# Patient Record
Sex: Male | Born: 1960 | Race: White | Hispanic: No | Marital: Single | Smoking: Never smoker
Health system: Southern US, Community
[De-identification: ages and names within clinical notes are randomized; demographics above are authoritative.]

## PROBLEM LIST (undated history)

## (undated) DIAGNOSIS — F79 Unspecified intellectual disabilities: Secondary | ICD-10-CM

## (undated) DIAGNOSIS — G40909 Epilepsy, unspecified, not intractable, without status epilepticus: Secondary | ICD-10-CM

## (undated) DIAGNOSIS — R011 Cardiac murmur, unspecified: Secondary | ICD-10-CM

## (undated) DIAGNOSIS — I340 Nonrheumatic mitral (valve) insufficiency: Secondary | ICD-10-CM

## (undated) DIAGNOSIS — E785 Hyperlipidemia, unspecified: Secondary | ICD-10-CM

## (undated) DIAGNOSIS — F429 Obsessive-compulsive disorder, unspecified: Secondary | ICD-10-CM

## (undated) HISTORY — DX: Epilepsy, unspecified, not intractable, without status epilepticus: G40.909

## (undated) HISTORY — DX: Nonrheumatic mitral (valve) insufficiency: I34.0

## (undated) HISTORY — DX: Cardiac murmur, unspecified: R01.1

## (undated) HISTORY — DX: Obsessive-compulsive disorder, unspecified: F42.9

## (undated) HISTORY — DX: Unspecified intellectual disabilities: F79

## (undated) HISTORY — DX: Hyperlipidemia, unspecified: E78.5

---

## 2004-06-13 ENCOUNTER — Emergency Department: Payer: Self-pay | Admitting: Emergency Medicine

## 2004-06-13 ENCOUNTER — Other Ambulatory Visit: Payer: Self-pay

## 2004-06-14 ENCOUNTER — Inpatient Hospital Stay: Payer: Self-pay

## 2004-06-14 ENCOUNTER — Other Ambulatory Visit: Payer: Self-pay

## 2004-08-15 ENCOUNTER — Ambulatory Visit: Payer: Self-pay | Admitting: Psychiatry

## 2004-08-17 ENCOUNTER — Ambulatory Visit: Payer: Self-pay | Admitting: Psychiatry

## 2005-11-22 ENCOUNTER — Emergency Department: Payer: Self-pay | Admitting: Emergency Medicine

## 2009-10-02 ENCOUNTER — Inpatient Hospital Stay: Payer: Self-pay | Admitting: Internal Medicine

## 2011-09-06 DIAGNOSIS — F429 Obsessive-compulsive disorder, unspecified: Secondary | ICD-10-CM | POA: Diagnosis not present

## 2011-09-06 DIAGNOSIS — R569 Unspecified convulsions: Secondary | ICD-10-CM | POA: Diagnosis not present

## 2011-09-06 DIAGNOSIS — E785 Hyperlipidemia, unspecified: Secondary | ICD-10-CM | POA: Diagnosis not present

## 2012-03-11 DIAGNOSIS — E785 Hyperlipidemia, unspecified: Secondary | ICD-10-CM | POA: Diagnosis not present

## 2012-03-11 DIAGNOSIS — R569 Unspecified convulsions: Secondary | ICD-10-CM | POA: Diagnosis not present

## 2012-03-11 DIAGNOSIS — F429 Obsessive-compulsive disorder, unspecified: Secondary | ICD-10-CM | POA: Diagnosis not present

## 2012-05-01 DIAGNOSIS — R6250 Unspecified lack of expected normal physiological development in childhood: Secondary | ICD-10-CM | POA: Diagnosis not present

## 2012-05-01 DIAGNOSIS — R569 Unspecified convulsions: Secondary | ICD-10-CM | POA: Diagnosis not present

## 2012-05-01 DIAGNOSIS — K029 Dental caries, unspecified: Secondary | ICD-10-CM | POA: Diagnosis not present

## 2012-05-05 DIAGNOSIS — Z23 Encounter for immunization: Secondary | ICD-10-CM | POA: Diagnosis not present

## 2012-05-12 DIAGNOSIS — E7800 Pure hypercholesterolemia, unspecified: Secondary | ICD-10-CM | POA: Diagnosis not present

## 2012-05-20 DIAGNOSIS — F411 Generalized anxiety disorder: Secondary | ICD-10-CM | POA: Diagnosis not present

## 2012-05-20 DIAGNOSIS — F79 Unspecified intellectual disabilities: Secondary | ICD-10-CM | POA: Diagnosis not present

## 2012-05-20 DIAGNOSIS — K0530 Chronic periodontitis, unspecified: Secondary | ICD-10-CM | POA: Diagnosis not present

## 2012-05-20 DIAGNOSIS — K029 Dental caries, unspecified: Secondary | ICD-10-CM | POA: Diagnosis not present

## 2012-05-20 DIAGNOSIS — K0381 Cracked tooth: Secondary | ICD-10-CM | POA: Diagnosis not present

## 2012-05-20 DIAGNOSIS — K122 Cellulitis and abscess of mouth: Secondary | ICD-10-CM | POA: Diagnosis not present

## 2012-05-20 DIAGNOSIS — R569 Unspecified convulsions: Secondary | ICD-10-CM | POA: Diagnosis not present

## 2012-09-01 DIAGNOSIS — E7800 Pure hypercholesterolemia, unspecified: Secondary | ICD-10-CM | POA: Diagnosis not present

## 2012-09-01 DIAGNOSIS — E785 Hyperlipidemia, unspecified: Secondary | ICD-10-CM | POA: Diagnosis not present

## 2012-09-01 DIAGNOSIS — R569 Unspecified convulsions: Secondary | ICD-10-CM | POA: Diagnosis not present

## 2012-10-29 DIAGNOSIS — F329 Major depressive disorder, single episode, unspecified: Secondary | ICD-10-CM | POA: Diagnosis not present

## 2012-12-15 DIAGNOSIS — E7800 Pure hypercholesterolemia, unspecified: Secondary | ICD-10-CM | POA: Diagnosis not present

## 2012-12-15 DIAGNOSIS — Z125 Encounter for screening for malignant neoplasm of prostate: Secondary | ICD-10-CM | POA: Diagnosis not present

## 2012-12-15 DIAGNOSIS — G4700 Insomnia, unspecified: Secondary | ICD-10-CM | POA: Diagnosis not present

## 2012-12-15 DIAGNOSIS — Z0000 Encounter for general adult medical examination without abnormal findings: Secondary | ICD-10-CM | POA: Diagnosis not present

## 2012-12-29 DIAGNOSIS — Z1211 Encounter for screening for malignant neoplasm of colon: Secondary | ICD-10-CM | POA: Diagnosis not present

## 2013-02-17 ENCOUNTER — Ambulatory Visit: Payer: Self-pay | Admitting: Emergency Medicine

## 2013-02-17 DIAGNOSIS — G40909 Epilepsy, unspecified, not intractable, without status epilepticus: Secondary | ICD-10-CM | POA: Diagnosis not present

## 2013-02-17 DIAGNOSIS — F79 Unspecified intellectual disabilities: Secondary | ICD-10-CM | POA: Diagnosis not present

## 2013-02-17 DIAGNOSIS — Z1211 Encounter for screening for malignant neoplasm of colon: Secondary | ICD-10-CM | POA: Diagnosis not present

## 2013-02-17 DIAGNOSIS — Z7982 Long term (current) use of aspirin: Secondary | ICD-10-CM | POA: Diagnosis not present

## 2013-02-17 DIAGNOSIS — Z79899 Other long term (current) drug therapy: Secondary | ICD-10-CM | POA: Diagnosis not present

## 2013-05-07 DIAGNOSIS — Z23 Encounter for immunization: Secondary | ICD-10-CM | POA: Diagnosis not present

## 2013-05-28 DIAGNOSIS — G40909 Epilepsy, unspecified, not intractable, without status epilepticus: Secondary | ICD-10-CM | POA: Diagnosis not present

## 2013-05-28 DIAGNOSIS — E7800 Pure hypercholesterolemia, unspecified: Secondary | ICD-10-CM | POA: Diagnosis not present

## 2013-07-08 DIAGNOSIS — R7301 Impaired fasting glucose: Secondary | ICD-10-CM | POA: Diagnosis not present

## 2013-10-16 DIAGNOSIS — F79 Unspecified intellectual disabilities: Secondary | ICD-10-CM | POA: Diagnosis not present

## 2013-11-17 DIAGNOSIS — F79 Unspecified intellectual disabilities: Secondary | ICD-10-CM | POA: Diagnosis not present

## 2013-12-22 DIAGNOSIS — E7800 Pure hypercholesterolemia, unspecified: Secondary | ICD-10-CM | POA: Diagnosis not present

## 2013-12-22 DIAGNOSIS — Z125 Encounter for screening for malignant neoplasm of prostate: Secondary | ICD-10-CM | POA: Diagnosis not present

## 2013-12-22 DIAGNOSIS — E069 Thyroiditis, unspecified: Secondary | ICD-10-CM | POA: Diagnosis not present

## 2013-12-22 DIAGNOSIS — G40909 Epilepsy, unspecified, not intractable, without status epilepticus: Secondary | ICD-10-CM | POA: Diagnosis not present

## 2013-12-22 DIAGNOSIS — I10 Essential (primary) hypertension: Secondary | ICD-10-CM | POA: Diagnosis not present

## 2013-12-22 DIAGNOSIS — F429 Obsessive-compulsive disorder, unspecified: Secondary | ICD-10-CM | POA: Diagnosis not present

## 2014-01-12 DIAGNOSIS — F79 Unspecified intellectual disabilities: Secondary | ICD-10-CM | POA: Diagnosis not present

## 2014-05-21 DIAGNOSIS — Z23 Encounter for immunization: Secondary | ICD-10-CM | POA: Diagnosis not present

## 2014-07-07 DIAGNOSIS — F42 Obsessive-compulsive disorder: Secondary | ICD-10-CM | POA: Diagnosis not present

## 2014-07-07 DIAGNOSIS — E780 Pure hypercholesterolemia: Secondary | ICD-10-CM | POA: Diagnosis not present

## 2014-07-07 DIAGNOSIS — G40909 Epilepsy, unspecified, not intractable, without status epilepticus: Secondary | ICD-10-CM | POA: Diagnosis not present

## 2014-07-07 DIAGNOSIS — R010 Benign and innocent cardiac murmurs: Secondary | ICD-10-CM | POA: Diagnosis not present

## 2014-07-07 DIAGNOSIS — F79 Unspecified intellectual disabilities: Secondary | ICD-10-CM | POA: Diagnosis not present

## 2014-09-08 DIAGNOSIS — F72 Severe intellectual disabilities: Secondary | ICD-10-CM | POA: Diagnosis not present

## 2014-10-06 DIAGNOSIS — F919 Conduct disorder, unspecified: Secondary | ICD-10-CM | POA: Diagnosis not present

## 2014-11-17 DIAGNOSIS — F919 Conduct disorder, unspecified: Secondary | ICD-10-CM | POA: Diagnosis not present

## 2014-11-30 DIAGNOSIS — J029 Acute pharyngitis, unspecified: Secondary | ICD-10-CM | POA: Diagnosis not present

## 2014-11-30 DIAGNOSIS — J209 Acute bronchitis, unspecified: Secondary | ICD-10-CM | POA: Diagnosis not present

## 2014-12-01 DIAGNOSIS — G40909 Epilepsy, unspecified, not intractable, without status epilepticus: Secondary | ICD-10-CM | POA: Insufficient documentation

## 2014-12-01 DIAGNOSIS — E785 Hyperlipidemia, unspecified: Secondary | ICD-10-CM | POA: Insufficient documentation

## 2014-12-01 DIAGNOSIS — F429 Obsessive-compulsive disorder, unspecified: Secondary | ICD-10-CM | POA: Insufficient documentation

## 2014-12-01 DIAGNOSIS — F79 Unspecified intellectual disabilities: Secondary | ICD-10-CM | POA: Insufficient documentation

## 2014-12-01 DIAGNOSIS — R011 Cardiac murmur, unspecified: Secondary | ICD-10-CM | POA: Insufficient documentation

## 2014-12-01 DIAGNOSIS — E781 Pure hyperglyceridemia: Secondary | ICD-10-CM | POA: Insufficient documentation

## 2014-12-08 ENCOUNTER — Ambulatory Visit
Admission: RE | Admit: 2014-12-08 | Discharge: 2014-12-08 | Disposition: A | Discharge status: Home / Self Care | Payer: Medicare Other | Source: Ambulatory Visit | Attending: Family Medicine | Admitting: Family Medicine

## 2014-12-08 ENCOUNTER — Other Ambulatory Visit: Payer: Self-pay | Admitting: Family Medicine

## 2014-12-08 DIAGNOSIS — R062 Wheezing: Secondary | ICD-10-CM

## 2014-12-08 DIAGNOSIS — R059 Cough, unspecified: Secondary | ICD-10-CM

## 2014-12-08 DIAGNOSIS — R05 Cough: Secondary | ICD-10-CM

## 2014-12-08 DIAGNOSIS — J209 Acute bronchitis, unspecified: Secondary | ICD-10-CM | POA: Diagnosis not present

## 2014-12-21 DIAGNOSIS — J209 Acute bronchitis, unspecified: Secondary | ICD-10-CM | POA: Diagnosis not present

## 2015-01-04 ENCOUNTER — Encounter: Payer: Self-pay | Admitting: Unknown Physician Specialty

## 2015-01-04 ENCOUNTER — Ambulatory Visit (INDEPENDENT_AMBULATORY_CARE_PROVIDER_SITE_OTHER): Payer: Medicare Other | Admitting: Unknown Physician Specialty

## 2015-01-04 VITALS — BP 114/74 | HR 80 | Temp 97.6°F | Ht 64.5 in | Wt 149.0 lb

## 2015-01-04 DIAGNOSIS — H8110 Benign paroxysmal vertigo, unspecified ear: Secondary | ICD-10-CM

## 2015-01-04 MED ORDER — MECLIZINE HCL 32 MG PO TABS: 32.0000 mg | ORAL_TABLET | Freq: Three times a day (TID) | ORAL | Status: DC | PRN

## 2015-01-04 NOTE — Progress Notes (Signed)
   BP 114/74 mmHg  Pulse 80  Temp(Src) 97.6 F (36.4 C) (Oral)  Ht 5' 4.5" (1.638 m)  Wt 149 lb (67.586 kg)  BMI 25.19 kg/m2  SpO2 95%   Subjective:    Patient ID: Raymond [name], male    DOB: [birth year], 54 y.o.   MRN: [id number]  HPI: Raymond [name] is a 54 y.o. male presenting on 01/04/2015 for Dizziness  Pt is here with his caregiver who gives the history.  Woke up light headed and a bit wobbly when he walks.  He went to work.  He is better now but still has some symptoms.  No nausea or vomiting.  2 weeks ago was on a lot of cold medicine.  This seems to be better.  No chest pain or SOB.  Caregiver denies mental status changes.      Relevant past medical, surgical, family and social history reviewed and updated as indicated. Interim medical history since our last visit reviewed. Allergies and medications reviewed and updated.  Review of Systems  Per HPI unless specifically indicated above     Objective:    BP 114/74 mmHg  Pulse 80  Temp(Src) 97.6 F (36.4 C) (Oral)  Ht 5' 4.5" (1.638 m)  Wt 149 lb (67.586 kg)  BMI 25.19 kg/m2  SpO2 95%  Wt Readings from Last 3 Encounters:  01/04/15 149 lb (67.586 kg)  12/21/14 147 lb (66.679 kg)    Physical Exam  Constitutional: He appears well-developed and well-nourished.  HENT:  Head: Normocephalic and atraumatic.  Right Ear: Tympanic membrane and ear canal normal.  Left Ear: Tympanic membrane and ear canal normal.  Nose: Nose normal.  Mouth/Throat: Uvula is midline.  Eyes: Pupils are equal, round, and reactive to light.  Neck: Normal range of motion.  Cardiovascular: Normal rate and regular rhythm.   Pulmonary/Chest: Effort normal and breath sounds normal.  Neurological: He has normal reflexes.  Skin: Skin is warm and dry.       Assessment & Plan:   Problem List Items Addressed This Visit    None    Visit Diagnoses    Benign positional vertigo, unspecified laterality    -  Primary    Rx for Meclizine to take  prn    Relevant Medications    meclizine (ANTIVERT) 32 MG tablet       Follow up plan: Rx for Meclizine.  If no improvement, needs Tegretol and Depakote levels done.

## 2015-01-04 NOTE — Patient Instructions (Signed)
Benign Positional Vertigo Vertigo means you feel like you or your surroundings are moving when they are not. Benign positional vertigo is the most common form of vertigo. Benign means that the cause of your condition is not serious. Benign positional vertigo is more common in older adults. CAUSES  Benign positional vertigo is the result of an upset in the labyrinth system. This is an area in the middle ear that helps control your balance. This may be caused by a viral infection, head injury, or repetitive motion. However, often no specific cause is found. SYMPTOMS  Symptoms of benign positional vertigo occur when you move your head or eyes in different directions. Some of the symptoms may include:  Loss of balance and falls.  Vomiting.  Blurred vision.  Dizziness.  Nausea.  Involuntary eye movements (nystagmus). DIAGNOSIS  Benign positional vertigo is usually diagnosed by physical exam. If the specific cause of your benign positional vertigo is unknown, your caregiver may perform imaging tests, such as magnetic resonance imaging (MRI) or computed tomography (CT). TREATMENT  Your caregiver may recommend movements or procedures to correct the benign positional vertigo. Medicines such as meclizine, benzodiazepines, and medicines for nausea may be used to treat your symptoms. In rare cases, if your symptoms are caused by certain conditions that affect the inner ear, you may need surgery. HOME CARE INSTRUCTIONS   Follow your caregiver's instructions.  Move slowly. Do not make sudden body or head movements.  Avoid driving.  Avoid operating heavy machinery.  Avoid performing any tasks that would be dangerous to you or others during a vertigo episode.  Drink enough fluids to keep your urine clear or pale yellow. SEEK IMMEDIATE MEDICAL CARE IF:   You develop problems with walking, weakness, numbness, or using your arms, hands, or legs.  You have difficulty speaking.  You develop  severe headaches.  Your nausea or vomiting continues or gets worse.  You develop visual changes.  Your family or friends notice any behavioral changes.  Your condition gets worse.  You have a fever.  You develop a stiff neck or sensitivity to light. MAKE SURE YOU:   Understand these instructions.  Will watch your condition.  Will get help right away if you are not doing well or get worse. Document Released: 04/23/2006 Document Revised: 10/08/2011 Document Reviewed: 04/05/2011 Exit[employer] Patient Information 2015 [employer]. This information is not intended to replace advice given to you by your health care provider. Make sure you discuss any questions you have with your health care provider.    

## 2015-01-12 ENCOUNTER — Other Ambulatory Visit: Payer: Self-pay | Admitting: Family Medicine

## 2015-01-12 ENCOUNTER — Telehealth: Payer: Self-pay | Admitting: Family Medicine

## 2015-01-12 ENCOUNTER — Ambulatory Visit (INDEPENDENT_AMBULATORY_CARE_PROVIDER_SITE_OTHER): Payer: Medicare Other | Admitting: Family Medicine

## 2015-01-12 ENCOUNTER — Encounter: Payer: Self-pay | Admitting: Family Medicine

## 2015-01-12 VITALS — BP 110/71 | HR 65 | Temp 98.2°F | Ht 66.0 in | Wt 147.0 lb

## 2015-01-12 DIAGNOSIS — E785 Hyperlipidemia, unspecified: Secondary | ICD-10-CM | POA: Diagnosis not present

## 2015-01-12 DIAGNOSIS — G40909 Epilepsy, unspecified, not intractable, without status epilepticus: Secondary | ICD-10-CM | POA: Diagnosis not present

## 2015-01-12 DIAGNOSIS — Z0000 Encounter for general adult medical examination without abnormal findings: Secondary | ICD-10-CM

## 2015-01-12 DIAGNOSIS — F79 Unspecified intellectual disabilities: Secondary | ICD-10-CM | POA: Diagnosis not present

## 2015-01-12 DIAGNOSIS — F42 Obsessive-compulsive disorder: Secondary | ICD-10-CM

## 2015-01-12 DIAGNOSIS — G40009 Localization-related (focal) (partial) idiopathic epilepsy and epileptic syndromes with seizures of localized onset, not intractable, without status epilepticus: Secondary | ICD-10-CM | POA: Diagnosis not present

## 2015-01-12 DIAGNOSIS — Z125 Encounter for screening for malignant neoplasm of prostate: Secondary | ICD-10-CM | POA: Diagnosis not present

## 2015-01-12 DIAGNOSIS — R011 Cardiac murmur, unspecified: Secondary | ICD-10-CM

## 2015-01-12 DIAGNOSIS — F429 Obsessive-compulsive disorder, unspecified: Secondary | ICD-10-CM | POA: Insufficient documentation

## 2015-01-12 LAB — LIPID PANEL PICCOLO, WAIVED
CHOL/HDL RATIO PICCOLO,WAIVE: 5.1 mg/dL — AB
Cholesterol Piccolo, Waived: 188 mg/dL (ref <200)
HDL Chol Piccolo, Waived: 37 mg/dL — ABNORMAL LOW (ref >59)
LDL CHOL CALC PICCOLO WAIVED: 121 mg/dL — AB (ref <100)
Triglycerides Piccolo,Waived: 147 mg/dL (ref <150)
VLDL CHOL CALC PICCOLO,WAIVE: 29 mg/dL (ref <30)

## 2015-01-12 LAB — URINALYSIS, ROUTINE W REFLEX MICROSCOPIC
Bilirubin, UA: NEGATIVE
GLUCOSE, UA: NEGATIVE
KETONES UA: NEGATIVE
Leukocytes, UA: NEGATIVE
Nitrite, UA: NEGATIVE
Protein, UA: NEGATIVE
RBC UA: NEGATIVE
Specific Gravity, UA: 1.025 (ref 1.005–1.030)
UUROB: 0.2 mg/dL (ref 0.2–1.0)
pH, UA: 5.5 (ref 5.0–7.5)

## 2015-01-12 MED ORDER — CARBAMAZEPINE 200 MG PO TABS: 200.0000 mg | ORAL_TABLET | Freq: Three times a day (TID) | ORAL | Status: DC

## 2015-01-12 MED ORDER — DIVALPROEX SODIUM ER 500 MG PO TB24: 1500.0000 mg | ORAL_TABLET | Freq: Every day | ORAL | Status: DC

## 2015-01-12 MED ORDER — FLUTICASONE PROPIONATE 50 MCG/ACT NA SUSP: 2.0000 | Freq: Every day | NASAL | Status: DC

## 2015-01-12 MED ORDER — SIMVASTATIN 20 MG PO TABS: 20.0000 mg | ORAL_TABLET | Freq: Every day | ORAL | Status: DC

## 2015-01-12 MED ORDER — FENOFIBRATE 145 MG PO TABS: 145.0000 mg | ORAL_TABLET | Freq: Every day | ORAL | Status: DC

## 2015-01-12 NOTE — Assessment & Plan Note (Signed)
Pt has not had any check of heart murmur. No current signs or sx.

## 2015-01-12 NOTE — Assessment & Plan Note (Signed)
The current medical regimen is effective;  continue present plan and medications.  

## 2015-01-12 NOTE — Progress Notes (Signed)
BP 110/71 mmHg  Pulse 65  Temp(Src) 98.2 F (36.8 C)  Ht 5\' 6"  (1.676 m)  Wt 147 lb (66.679 kg)  BMI 23.74 kg/m2  SpO2 98%   Subjective:    Patient ID: Raymond [name], male    DOB: 1960-09-18, 54 y.o.   MRN: [id number]  HPI: Raymond [name] is a 54 y.o. male  Chief Complaint  Patient presents with  . Annual Exam    Relevant past medical, surgical, family and social history reviewed and updated as indicated. Interim medical history since our last visit reviewed. Allergies and medications reviewed and updated.  psy issues stable in care home and here with caregiver who reports on pt. Pt doing well with no c/o. No problems with meds  Work is going well  Lipids stable and meds doing well  Review of Systems  Constitutional: Negative.   HENT: Negative.   Eyes: Negative.   Respiratory: Negative.   Cardiovascular: Negative.   Endocrine: Negative.   Musculoskeletal: Negative.   Skin: Negative.   Allergic/Immunologic: Negative.   Neurological: Negative.   Hematological: Negative.   Psychiatric/Behavioral: Negative.     Per HPI unless specifically indicated above     Objective:    BP 110/71 mmHg  Pulse 65  Temp(Src) 98.2 F (36.8 C)  Ht 5\' 6"  (1.676 m)  Wt 147 lb (66.679 kg)  BMI 23.74 kg/m2  SpO2 98%  Wt Readings from Last 3 Encounters:  01/12/15 147 lb (66.679 kg)  01/04/15 149 lb (67.586 kg)  12/21/14 147 lb (66.679 kg)    Physical Exam  Constitutional: He is oriented to person, place, and time. He appears well-developed and well-nourished.  HENT:  Head: Normocephalic and atraumatic.  Right Ear: External ear normal.  Left Ear: External ear normal.  Eyes: Conjunctivae and EOM are normal. Pupils are equal, round, and reactive to light.  Neck: Normal range of motion. Neck supple.  Cardiovascular: Normal rate, regular rhythm, intact distal pulses and normal pulses.   Murmur heard.  Systolic murmur is present with a grade of 3/6  Aortic    Pulmonary/Chest: Effort normal and breath sounds normal.  Abdominal: Soft. Bowel sounds are normal. There is no splenomegaly or hepatomegaly.  Genitourinary: Rectum normal and penis normal.  Prostate exam limited  Harriett Sine was chaperone  Musculoskeletal: Normal range of motion.  Neurological: He is alert and oriented to person, place, and time. He has normal reflexes.  Skin: No rash noted. No erythema.  Psychiatric: He has a normal mood and affect. His behavior is normal. Judgment and thought content normal.    No results found for this or any previous visit.    Assessment & Plan:   Problem List Items Addressed This Visit      Nervous and Auditory   Epileptic seizures    The current medical regimen is effective;  continue present plan and medications.       Relevant Medications   divalproex (DEPAKOTE ER) 500 MG 24 hr tablet   carbamazepine (TEGRETOL) 200 MG tablet   Other Relevant Orders   Basic metabolic panel     Other   Mental retardation   OCD (obsessive compulsive disorder)   Hyperlipidemia    The current medical regimen is effective;  continue present plan and medications.       Relevant Medications   simvastatin (ZOCOR) 20 MG tablet   fenofibrate (TRICOR) 145 MG tablet   Other Relevant Orders   ALT   AST   Lipid  panel   Heart murmur    Pt has not had any check of heart murmur. No current signs or sx.      Relevant Orders   Ambulatory referral to Cardiology    Other Visit Diagnoses    Routine general medical examination at a health care facility    -  Primary    Relevant Orders    CBC with Differential/Platelet    Comprehensive metabolic panel    Lipid Panel w/o Chol/HDL Ratio    TSH    Urinalysis, Routine w reflex microscopic    Screening PSA (prostate specific antigen)        Relevant Orders    CBC with Differential/Platelet    Comprehensive metabolic panel    Lipid Panel w/o Chol/HDL Ratio    TSH    Urinalysis, Routine w reflex microscopic         Follow up plan: Return in about 6 months (around 07/14/2015) for lipid check.

## 2015-01-13 LAB — CBC WITH DIFFERENTIAL/PLATELET
Basophils Absolute: 0 10*3/uL (ref 0.0–0.2)
Basos: 0 %
EOS (ABSOLUTE): 0.1 10*3/uL (ref 0.0–0.4)
EOS: 2 %
Hematocrit: 37.8 % (ref 37.5–51.0)
Hemoglobin: 12.7 g/dL (ref 12.6–17.7)
IMMATURE GRANS (ABS): 0 10*3/uL (ref 0.0–0.1)
Immature Granulocytes: 0 %
LYMPHS: 57 %
Lymphocytes Absolute: 2.6 10*3/uL (ref 0.7–3.1)
MCH: 31.1 pg (ref 26.6–33.0)
MCHC: 33.6 g/dL (ref 31.5–35.7)
MCV: 92 fL (ref 79–97)
MONOCYTES: 10 %
MONOS ABS: 0.4 10*3/uL (ref 0.1–0.9)
NEUTROS PCT: 31 %
Neutrophils Absolute: 1.4 10*3/uL (ref 1.4–7.0)
PLATELETS: 166 10*3/uL (ref 150–379)
RBC: 4.09 x10E6/uL — AB (ref 4.14–5.80)
RDW: 14.5 % (ref 12.3–15.4)
WBC: 4.5 10*3/uL (ref 3.4–10.8)

## 2015-01-13 LAB — COMPREHENSIVE METABOLIC PANEL
A/G RATIO: 1.8 (ref 1.1–2.5)
ALT: 12 IU/L (ref 0–44)
AST: 19 IU/L (ref 0–40)
Albumin: 4.4 g/dL (ref 3.5–5.5)
Alkaline Phosphatase: 40 IU/L (ref 39–117)
BUN/Creatinine Ratio: 18 (ref 9–20)
BUN: 15 mg/dL (ref 6–24)
Bilirubin Total: 0.3 mg/dL (ref 0.0–1.2)
CHLORIDE: 102 mmol/L (ref 97–108)
CO2: 19 mmol/L (ref 18–29)
Calcium: 9.2 mg/dL (ref 8.7–10.2)
Creatinine, Ser: 0.84 mg/dL (ref 0.76–1.27)
GFR calc Af Amer: 116 mL/min/{1.73_m2} (ref >59)
GFR, EST NON AFRICAN AMERICAN: 101 mL/min/{1.73_m2} (ref >59)
Globulin, Total: 2.4 g/dL (ref 1.5–4.5)
Glucose: 99 mg/dL (ref 65–99)
POTASSIUM: 4.3 mmol/L (ref 3.5–5.2)
Sodium: 138 mmol/L (ref 134–144)
TOTAL PROTEIN: 6.8 g/dL (ref 6.0–8.5)

## 2015-01-13 LAB — TSH: TSH: 3.72 u[IU]/mL (ref 0.450–4.500)

## 2015-01-17 ENCOUNTER — Telehealth: Payer: Self-pay | Admitting: Family Medicine

## 2015-01-18 ENCOUNTER — Telehealth: Payer: Self-pay

## 2015-01-18 NOTE — Telephone Encounter (Signed)
[name] from [facility] and [facility] are requesting clarification for patient's Depakote Rx Historically he has been on Depakote DR 500mg  TID  New Rx was for Depakote ER Did you mean to change?

## 2015-01-19 MED ORDER — DIVALPROEX SODIUM 500 MG PO DR TAB: 500.0000 mg | DELAYED_RELEASE_TABLET | Freq: Three times a day (TID) | ORAL | Status: DC

## 2015-02-09 DIAGNOSIS — F919 Conduct disorder, unspecified: Secondary | ICD-10-CM | POA: Diagnosis not present

## 2015-03-04 ENCOUNTER — Encounter: Payer: Self-pay | Admitting: *Deleted

## 2015-03-04 ENCOUNTER — Ambulatory Visit: Payer: Medicare Other | Admitting: Cardiovascular Disease

## 2015-05-02 ENCOUNTER — Telehealth: Payer: Self-pay

## 2015-05-02 MED ORDER — FLUTICASONE PROPIONATE 50 MCG/ACT NA SUSP: 2.0000 | Freq: Every day | NASAL | Status: DC

## 2015-05-02 NOTE — Telephone Encounter (Signed)
[facility] is requesting that patient's Flonase Rx to be changed to PRN [facility] [place]

## 2015-05-04 DIAGNOSIS — F919 Conduct disorder, unspecified: Secondary | ICD-10-CM | POA: Diagnosis not present

## 2015-05-09 ENCOUNTER — Other Ambulatory Visit: Payer: Self-pay | Admitting: Family Medicine

## 2015-05-09 NOTE — Telephone Encounter (Signed)
Call for an apt PE

## 2015-05-20 ENCOUNTER — Ambulatory Visit: Payer: Medicare Other

## 2015-05-20 DIAGNOSIS — Z23 Encounter for immunization: Secondary | ICD-10-CM

## 2015-06-27 ENCOUNTER — Telehealth: Payer: Self-pay

## 2015-06-27 ENCOUNTER — Other Ambulatory Visit: Payer: Self-pay | Admitting: Family Medicine

## 2015-06-27 NOTE — Telephone Encounter (Signed)
Senexon-S Rx called in to [facility] as PRN

## 2015-06-27 NOTE — Telephone Encounter (Signed)
Raymond [name] is requesting that  Patient's Senekot Rx be changed to PRN  Patient has had some accidents and they and patient would like to try PRN  If ok please send new Rx to [facility] [place]

## 2015-06-27 NOTE — Telephone Encounter (Signed)
rx

## 2015-07-04 ENCOUNTER — Other Ambulatory Visit: Payer: Self-pay | Admitting: Family Medicine

## 2015-07-11 ENCOUNTER — Encounter: Payer: Self-pay | Admitting: Family Medicine

## 2015-07-11 ENCOUNTER — Ambulatory Visit (INDEPENDENT_AMBULATORY_CARE_PROVIDER_SITE_OTHER): Payer: Medicare Other | Admitting: Family Medicine

## 2015-07-11 VITALS — BP 103/69 | HR 76 | Temp 97.6°F | Ht 65.5 in | Wt 144.0 lb

## 2015-07-11 DIAGNOSIS — R5383 Other fatigue: Secondary | ICD-10-CM | POA: Insufficient documentation

## 2015-07-11 DIAGNOSIS — R5382 Chronic fatigue, unspecified: Secondary | ICD-10-CM | POA: Diagnosis not present

## 2015-07-11 DIAGNOSIS — G40009 Localization-related (focal) (partial) idiopathic epilepsy and epileptic syndromes with seizures of localized onset, not intractable, without status epilepticus: Secondary | ICD-10-CM | POA: Diagnosis not present

## 2015-07-11 DIAGNOSIS — E785 Hyperlipidemia, unspecified: Secondary | ICD-10-CM | POA: Diagnosis not present

## 2015-07-11 DIAGNOSIS — R531 Weakness: Secondary | ICD-10-CM | POA: Insufficient documentation

## 2015-07-11 NOTE — Progress Notes (Signed)
BP 103/69 mmHg  Pulse 76  Temp(Src) 97.6 F (36.4 C)  Ht 5' 5.5" (1.664 m)  Wt 144 lb (65.318 kg)  BMI 23.59 kg/m2  SpO2 97%   Subjective:    Patient ID: Raymond [name], male    DOB: 1960/12/15, 54 y.o.   MRN: [id number]  HPI: Raymond [name] is a 54 y.o. male  Chief Complaint  Patient presents with  . Hyperlipidemia  . Fatigue   Patient otherwise doing well has noted some fatigue starting this fall and occasionally wanting to nap no real specific complaints Patient here with caregiver Reviewed notes psychiatry following his psychiatric medications and doing stable.  Relevant past medical, surgical, family and social history reviewed and updated as indicated. Interim medical history since our last visit reviewed. Allergies and medications reviewed and updated.  Review of Systems  Constitutional: Negative.   Respiratory: Negative.   Cardiovascular: Negative.  Negative for chest pain, palpitations and leg swelling.    Per HPI unless specifically indicated above     Objective:    BP 103/69 mmHg  Pulse 76  Temp(Src) 97.6 F (36.4 C)  Ht 5' 5.5" (1.664 m)  Wt 144 lb (65.318 kg)  BMI 23.59 kg/m2  SpO2 97%  Wt Readings from Last 3 Encounters:  07/11/15 144 lb (65.318 kg)  01/12/15 147 lb (66.679 kg)  01/04/15 149 lb (67.586 kg)    Physical Exam  Constitutional: He is oriented to person, place, and time. He appears well-developed and well-nourished. No distress.  HENT:  Head: Normocephalic and atraumatic.  Right Ear: Hearing normal.  Left Ear: Hearing normal.  Nose: Nose normal.  Eyes: Conjunctivae and lids are normal. Right eye exhibits no discharge. Left eye exhibits no discharge. No scleral icterus.  Cardiovascular:  Unchanged murmur  Pulmonary/Chest: Effort normal and breath sounds normal. No respiratory distress.  Musculoskeletal: Normal range of motion.  Neurological: He is alert and oriented to person, place, and time.  Skin: Skin is intact. No  rash noted.  Psychiatric: He has a normal mood and affect. His speech is normal and behavior is normal. Cognition and memory are normal.    Results for orders placed or performed in visit on 01/12/15  CBC with Differential/Platelet  Result Value Ref Range   WBC 4.5 3.4 - 10.8 x10E3/uL   RBC 4.09 (L) 4.14 - 5.80 x10E6/uL   Hemoglobin 12.7 12.6 - 17.7 g/dL   Hematocrit 78.2 95.6 - 51.0 %   MCV 92 79 - 97 fL   MCH 31.1 26.6 - 33.0 pg   MCHC 33.6 31.5 - 35.7 g/dL   RDW 21.3 08.6 - 57.8 %   Platelets 166 150 - 379 x10E3/uL   Neutrophils 31 %   Lymphs 57 %   Monocytes 10 %   Eos 2 %   Basos 0 %   Neutrophils Absolute 1.4 1.4 - 7.0 x10E3/uL   Lymphocytes Absolute 2.6 0.7 - 3.1 x10E3/uL   Monocytes Absolute 0.4 0.1 - 0.9 x10E3/uL   EOS (ABSOLUTE) 0.1 0.0 - 0.4 x10E3/uL   Basophils Absolute 0.0 0.0 - 0.2 x10E3/uL   Immature Granulocytes 0 %   Immature Grans (Abs) 0.0 0.0 - 0.1 x10E3/uL  Comprehensive metabolic panel  Result Value Ref Range   Glucose 99 65 - 99 mg/dL   BUN 15 6 - 24 mg/dL   Creatinine, Ser 4.69 0.76 - 1.27 mg/dL   GFR calc non Af Amer 101 >59 mL/min/1.73   GFR calc Af Amer 116 >59  mL/min/1.73   BUN/Creatinine Ratio 18 9 - 20   Sodium 138 134 - 144 mmol/L   Potassium 4.3 3.5 - 5.2 mmol/L   Chloride 102 97 - 108 mmol/L   CO2 19 18 - 29 mmol/L   Calcium 9.2 8.7 - 10.2 mg/dL   Total Protein 6.8 6.0 - 8.5 g/dL   Albumin 4.4 3.5 - 5.5 g/dL   Globulin, Total 2.4 1.5 - 4.5 g/dL   Albumin/Globulin Ratio 1.8 1.1 - 2.5   Bilirubin Total 0.3 0.0 - 1.2 mg/dL   Alkaline Phosphatase 40 39 - 117 IU/L   AST 19 0 - 40 IU/L   ALT 12 0 - 44 IU/L  TSH  Result Value Ref Range   TSH 3.720 0.450 - 4.500 uIU/mL  Urinalysis, Routine w reflex microscopic  Result Value Ref Range   Specific Gravity, UA 1.025 1.005 - 1.030   pH, UA 5.5 5.0 - 7.5   Color, UA Yellow Yellow   Appearance Ur Clear Clear   Leukocytes, UA Negative Negative   Protein, UA Negative Negative/Trace    Glucose, UA Negative Negative   Ketones, UA Negative Negative   RBC, UA Negative Negative   Bilirubin, UA Negative Negative   Urobilinogen, Ur 0.2 0.2 - 1.0 mg/dL   Nitrite, UA Negative Negative  Lipid Panel Piccolo, Waived  Result Value Ref Range   Cholesterol Piccolo, Waived 188 <200 mg/dL   HDL Chol Piccolo, Waived 37 (L) >59 mg/dL   Triglycerides Piccolo,Waived 147 <150 mg/dL   Chol/HDL Ratio Piccolo,Waive 5.1 (H) mg/dL   LDL Chol Calc Piccolo Waived 121 (H) <100 mg/dL   VLDL Chol Calc Piccolo,Waive 29 <30 mg/dL      Assessment & Plan:   Problem List Items Addressed This Visit      Nervous and Auditory   Epileptic seizures (HCC)     Other   Hyperlipidemia   Fatigue - Primary    Discuss fatigue will try stopping simvastatin to see if that makes any difference and observe no difference will restart after 1 month or so. Observe lifestyle may not be getting proper sleep also and observe nutrition.          Follow up plan: Return in about 6 months (around 01/09/2016), or if symptoms worsen or fail to improve, for Physical Exam.

## 2015-07-11 NOTE — Assessment & Plan Note (Signed)
Discuss fatigue will try stopping simvastatin to see if that makes any difference and observe no difference will restart after 1 month or so. Observe lifestyle may not be getting proper sleep also and observe nutrition.

## 2015-07-12 ENCOUNTER — Telehealth: Payer: Self-pay | Admitting: Family Medicine

## 2015-07-12 DIAGNOSIS — R7309 Other abnormal glucose: Secondary | ICD-10-CM

## 2015-07-12 LAB — LIPID PANEL
CHOL/HDL RATIO: 4.3 ratio (ref 0.0–5.0)
Cholesterol, Total: 159 mg/dL (ref 100–199)
HDL: 37 mg/dL — AB (ref >39)
LDL Calculated: 88 mg/dL (ref 0–99)
Triglycerides: 172 mg/dL — ABNORMAL HIGH (ref 0–149)
VLDL CHOLESTEROL CAL: 34 mg/dL (ref 5–40)

## 2015-07-12 LAB — BASIC METABOLIC PANEL
BUN / CREAT RATIO: 11 (ref 9–20)
BUN: 10 mg/dL (ref 6–24)
CALCIUM: 9.9 mg/dL (ref 8.7–10.2)
CO2: 22 mmol/L (ref 18–29)
Chloride: 102 mmol/L (ref 96–106)
Creatinine, Ser: 0.87 mg/dL (ref 0.76–1.27)
GFR, EST AFRICAN AMERICAN: 114 mL/min/{1.73_m2} (ref >59)
GFR, EST NON AFRICAN AMERICAN: 99 mL/min/{1.73_m2} (ref >59)
Glucose: 138 mg/dL — ABNORMAL HIGH (ref 65–99)
Potassium: 3.9 mmol/L (ref 3.5–5.2)
Sodium: 140 mmol/L (ref 134–144)

## 2015-07-12 LAB — AST: AST: 15 IU/L (ref 0–40)

## 2015-07-12 LAB — ALT: ALT: 10 IU/L (ref 0–44)

## 2015-07-12 NOTE — Telephone Encounter (Signed)
-----   Message from Lurlean HornsNancy H Wilson, CMA sent at 07/12/2015  5:16 PM EST ----- Raymond [name] with [employer]/ labs

## 2015-07-12 NOTE — Telephone Encounter (Signed)
Phone call Discussed with caregiver elevated glucose may be indication of diabetes causing fatigue will have patient come in to check A1c

## 2015-07-15 ENCOUNTER — Other Ambulatory Visit: Payer: Medicare Other

## 2015-07-15 DIAGNOSIS — R7309 Other abnormal glucose: Secondary | ICD-10-CM | POA: Diagnosis not present

## 2015-07-15 LAB — BAYER DCA HB A1C WAIVED: HB A1C: 5.4 % (ref <7.0)

## 2015-08-10 DIAGNOSIS — F919 Conduct disorder, unspecified: Secondary | ICD-10-CM | POA: Diagnosis not present

## 2015-09-22 ENCOUNTER — Other Ambulatory Visit: Payer: Self-pay | Admitting: Family Medicine

## 2015-09-22 ENCOUNTER — Telehealth: Payer: Self-pay

## 2015-09-22 MED ORDER — GUAIFENESIN ER 600 MG PO TB12: 600.0000 mg | ORAL_TABLET | Freq: Two times a day (BID) | ORAL | Status: DC

## 2015-09-22 NOTE — Telephone Encounter (Signed)
Please see note on your desk

## 2015-10-07 NOTE — Telephone Encounter (Signed)
done

## 2015-10-26 ENCOUNTER — Encounter: Payer: Self-pay | Admitting: Family Medicine

## 2015-11-02 DIAGNOSIS — F919 Conduct disorder, unspecified: Secondary | ICD-10-CM | POA: Diagnosis not present

## 2015-11-03 ENCOUNTER — Telehealth: Payer: Self-pay

## 2015-11-03 ENCOUNTER — Other Ambulatory Visit: Payer: Self-pay | Admitting: Family Medicine

## 2015-11-03 MED ORDER — SIMVASTATIN 20 MG PO TABS: ORAL_TABLET | ORAL | Status: DC

## 2015-11-03 NOTE — Telephone Encounter (Signed)
Rx sent to his pharmacy

## 2015-11-03 NOTE — Telephone Encounter (Signed)
[facility] is requesting a 90 day Rx for Simvastatin 20mg  - patient uses [facility]

## 2016-01-02 DIAGNOSIS — W19XXXA Unspecified fall, initial encounter: Secondary | ICD-10-CM | POA: Diagnosis not present

## 2016-01-02 DIAGNOSIS — S8001XA Contusion of right knee, initial encounter: Secondary | ICD-10-CM | POA: Diagnosis not present

## 2016-01-02 DIAGNOSIS — M25562 Pain in left knee: Secondary | ICD-10-CM | POA: Diagnosis not present

## 2016-01-02 DIAGNOSIS — R42 Dizziness and giddiness: Secondary | ICD-10-CM | POA: Diagnosis not present

## 2016-01-02 DIAGNOSIS — G40909 Epilepsy, unspecified, not intractable, without status epilepticus: Secondary | ICD-10-CM | POA: Diagnosis not present

## 2016-01-04 ENCOUNTER — Ambulatory Visit (INDEPENDENT_AMBULATORY_CARE_PROVIDER_SITE_OTHER): Payer: Medicare Other | Admitting: Family Medicine

## 2016-01-04 ENCOUNTER — Encounter: Payer: Self-pay | Admitting: Family Medicine

## 2016-01-04 VITALS — BP 108/70 | HR 72 | Temp 97.4°F

## 2016-01-04 DIAGNOSIS — G40009 Localization-related (focal) (partial) idiopathic epilepsy and epileptic syndromes with seizures of localized onset, not intractable, without status epilepticus: Secondary | ICD-10-CM

## 2016-01-04 DIAGNOSIS — R011 Cardiac murmur, unspecified: Secondary | ICD-10-CM

## 2016-01-04 DIAGNOSIS — F79 Unspecified intellectual disabilities: Secondary | ICD-10-CM | POA: Diagnosis not present

## 2016-01-04 NOTE — Progress Notes (Signed)
BP 108/70 mmHg  Pulse 72  Temp(Src) 97.4 F (36.3 C)  SpO2 96%   Subjective:    Patient ID: Raymond [name], male    DOB: 1960-09-07, 55 y.o.   MRN: [id number]  HPI: Raymond [name] is a 55 y.o. male  Chief Complaint  Patient presents with  . "wobbly", seizures    went to Urgent Care on Monday   Patient with contusion to left knee from above is doing better has neoprene knee brace which helps some. Knees not at all tender patient's walking without problems.  Bigger issue is patient's seizure hasn't seen a neurologist for some time have maintained his medications without change and had a seizure about 3 or 4 days ago Caregiver with patient is new on the shift and wasn't with him over this weekend probably had a major motor seizure. There's been no change in neurologic status afterwards.  I'm not sure this abrupt change in patient's neurologic status was observed. Not sure the circumstances around this.   Patient and caregiving home due to profound mental retardation and patient is able to provide very little history.  Patient's psychiatrist Dr. Elesa MassedWard hasn't changed any medications.  Relevant past medical, surgical, family and social history reviewed and updated as indicated. Interim medical history since our last visit reviewed. Allergies and medications reviewed and updated.  Unable to do an adequate review of systems  Review of Systems  Constitutional: Negative for fever and activity change.  HENT: Negative.   Respiratory: Negative.   Cardiovascular: Negative.   Gastrointestinal: Negative.     Per HPI unless specifically indicated above     Objective:    BP 108/70 mmHg  Pulse 72  Temp(Src) 97.4 F (36.3 C)  SpO2 96%  Wt Readings from Last 3 Encounters:  07/11/15 144 lb (65.318 kg)  01/12/15 147 lb (66.679 kg)  01/04/15 149 lb (67.586 kg)    Physical Exam  Constitutional: He is oriented to person, place, and time. He appears well-developed and  well-nourished. No distress.  HENT:  Head: Normocephalic and atraumatic.  Right Ear: Hearing normal.  Left Ear: Hearing normal.  Nose: Nose normal.  Eyes: Conjunctivae and lids are normal. Right eye exhibits no discharge. Left eye exhibits no discharge. No scleral icterus.  Cardiovascular: Regular rhythm.   3/6 systolic aortic area murmur. This murmur seems louder than before.  Pulmonary/Chest: Effort normal. No respiratory distress.  Musculoskeletal: Normal range of motion.  Neurological: He is alert and oriented to person, place, and time.  Skin: Skin is intact. No rash noted.  Psychiatric: He has a normal mood and affect. His speech is normal and behavior is normal. Judgment and thought content normal. Cognition and memory are normal.    Results for orders placed or performed in visit on 07/15/15  Bayer DCA Hb A1c Waived  Result Value Ref Range   Bayer DCA Hb A1c Waived 5.4 <7.0 %      Assessment & Plan:   Problem List Items Addressed This Visit      Nervous and Auditory   Epileptic seizures [facility](HCC)    Patient with recent seizure was previous seizure about 8 months ago seizures and otherwise been well controlled. Will refer to neurology to further evaluate treatment. This is an abrupt change in neurologic status with a threat to life and body function. I'm unable to obtain adequate history about the patient's seizure.      Relevant Orders   Ambulatory referral to Neurology   CBC  with Differential/Platelet   Comprehensive metabolic panel     Other   Mental retardation    Stable with no change      Heart murmur - Primary    Patient with undiagnosed murmur will refer to cardiology to further evaluate. This may be contributing to the patient's neurologic status and/or seizure.      Relevant Orders   Ambulatory referral to Cardiology       Follow up plan: Return for Physical Exam has appointment coming up in July.

## 2016-01-04 NOTE — Assessment & Plan Note (Addendum)
Patient with undiagnosed murmur will refer to cardiology to further evaluate. This may be contributing to the patient's neurologic status and/or seizure.

## 2016-01-04 NOTE — Assessment & Plan Note (Signed)
Stable with no change 

## 2016-01-04 NOTE — Assessment & Plan Note (Addendum)
Patient with recent seizure was previous seizure about 8 months ago seizures and otherwise been well controlled. Will refer to neurology to further evaluate treatment. This is an abrupt change in neurologic status with a threat to life and body function. I'm unable to obtain adequate history about the patient's seizure.

## 2016-01-05 LAB — CBC WITH DIFFERENTIAL/PLATELET
BASOS ABS: 0 10*3/uL (ref 0.0–0.2)
Basos: 0 %
EOS (ABSOLUTE): 0.1 10*3/uL (ref 0.0–0.4)
EOS: 1 %
HEMOGLOBIN: 12.1 g/dL — AB (ref 12.6–17.7)
Hematocrit: 36.4 % — ABNORMAL LOW (ref 37.5–51.0)
IMMATURE GRANS (ABS): 0 10*3/uL (ref 0.0–0.1)
Immature Granulocytes: 0 %
LYMPHS: 26 %
Lymphocytes Absolute: 2.8 10*3/uL (ref 0.7–3.1)
MCH: 30.1 pg (ref 26.6–33.0)
MCHC: 33.2 g/dL (ref 31.5–35.7)
MCV: 91 fL (ref 79–97)
MONOCYTES: 10 %
Monocytes Absolute: 1 10*3/uL — ABNORMAL HIGH (ref 0.1–0.9)
NEUTROS ABS: 6.9 10*3/uL (ref 1.4–7.0)
Neutrophils: 63 %
Platelets: 181 10*3/uL (ref 150–379)
RBC: 4.02 x10E6/uL — AB (ref 4.14–5.80)
RDW: 14.5 % (ref 12.3–15.4)
WBC: 10.8 10*3/uL (ref 3.4–10.8)

## 2016-01-05 LAB — COMPREHENSIVE METABOLIC PANEL
ALK PHOS: 45 IU/L (ref 39–117)
ALT: 7 IU/L (ref 0–44)
AST: 14 IU/L (ref 0–40)
Albumin/Globulin Ratio: 1.8 (ref 1.2–2.2)
Albumin: 4.5 g/dL (ref 3.5–5.5)
BILIRUBIN TOTAL: 0.2 mg/dL (ref 0.0–1.2)
BUN/Creatinine Ratio: 11 (ref 9–20)
BUN: 9 mg/dL (ref 6–24)
CHLORIDE: 98 mmol/L (ref 96–106)
CO2: 21 mmol/L (ref 18–29)
Calcium: 9.5 mg/dL (ref 8.7–10.2)
Creatinine, Ser: 0.83 mg/dL (ref 0.76–1.27)
GFR calc non Af Amer: 100 mL/min/{1.73_m2} (ref >59)
GFR, EST AFRICAN AMERICAN: 116 mL/min/{1.73_m2} (ref >59)
GLUCOSE: 79 mg/dL (ref 65–99)
Globulin, Total: 2.5 g/dL (ref 1.5–4.5)
Potassium: 4.2 mmol/L (ref 3.5–5.2)
Sodium: 136 mmol/L (ref 134–144)
TOTAL PROTEIN: 7 g/dL (ref 6.0–8.5)

## 2016-01-18 ENCOUNTER — Telehealth: Payer: Self-pay

## 2016-01-18 MED ORDER — MECLIZINE HCL 25 MG PO TABS: 25.0000 mg | ORAL_TABLET | Freq: Three times a day (TID) | ORAL | Status: DC | PRN

## 2016-01-18 NOTE — Telephone Encounter (Signed)
Requesting Rx for Meclizine, he has had a "spell" with his inner ear  [facility] [place]

## 2016-01-24 ENCOUNTER — Encounter: Payer: Medicare Other | Admitting: Family Medicine

## 2016-02-13 DIAGNOSIS — L03115 Cellulitis of right lower limb: Secondary | ICD-10-CM | POA: Diagnosis not present

## 2016-02-16 ENCOUNTER — Encounter: Payer: Self-pay | Admitting: Cardiovascular Disease

## 2016-02-16 ENCOUNTER — Ambulatory Visit (INDEPENDENT_AMBULATORY_CARE_PROVIDER_SITE_OTHER): Payer: Medicare Other | Admitting: Cardiovascular Disease

## 2016-02-16 VITALS — BP 100/66 | HR 76 | Ht 66.0 in | Wt 140.4 lb

## 2016-02-16 DIAGNOSIS — R011 Cardiac murmur, unspecified: Secondary | ICD-10-CM | POA: Diagnosis not present

## 2016-02-16 DIAGNOSIS — Z7189 Other specified counseling: Secondary | ICD-10-CM | POA: Diagnosis not present

## 2016-02-16 DIAGNOSIS — Z7689 Persons encountering health services in other specified circumstances: Secondary | ICD-10-CM

## 2016-02-16 NOTE — Progress Notes (Signed)
Cardiology Office Note   Date:  02/16/2016   ID:  Raymond RobCharles T [name], DOB 1960/08/08, MRN [id number]  PCP:  Vonita MossMark Crissman, MD  Cardiologist:   Lorine BearsMuhammad Arida, MD   Chief Complaint  Patient presents with  . Establish Care    heart murmur      History of Present Illness: Raymond [name] is a 55 y.o. male who Was referred by Dr. Dossie Arbourrissman for evaluation of a heart murmur. The patient has known history of severe mental retardation, seizure disorder and hyperlipidemia. He lives in an adult care home. He has reported history of a heart murmur for many years but no recent echocardiogram. The patient has no symptoms and denies any chest pain, shortness of breath, palpitations, syncope or presyncope. The family history is not known.    Past Medical History  Diagnosis Date  . Mental retardation   . OCD (obsessive compulsive disorder)   . Hyperlipidemia   . Epileptic seizures (HCC)   . Heart murmur     systolic    History reviewed. No pertinent past surgical history.   Current Outpatient Prescriptions  Medication Sig Dispense Refill  . ASPIRIN LOW DOSE 81 MG EC tablet TAKE 1 TABLET BY MOUTH ONCE DAILY. 30 tablet PRN  . carbamazepine (TEGRETOL) 200 MG tablet TAKE 1 TABLET BY MOUTH 3 TIMES DAILY WITH MEALS. 90 tablet 0  . cephALEXin (KEFLEX) 500 MG capsule Take 500 mg by mouth daily.    . divalproex (DEPAKOTE ER) 500 MG 24 hr tablet Take 3 tablets (1,500 mg total) by mouth daily. 90 tablet 6  . fenofibrate (TRICOR) 145 MG tablet TAKE 1 TABLET BY MOUTH ONCE DAILY FOR LIPIDS. 30 tablet 1  . fluticasone (FLONASE) 50 MCG/ACT nasal spray Place 2 sprays into both nostrils daily. Use PRN 3 g 4  . meclizine (ANTIVERT) 25 MG tablet Take 1 tablet (25 mg total) by mouth 3 (three) times daily as needed for dizziness. 30 tablet 0  . mirtazapine (REMERON) 15 MG tablet TAKE 1 TABLET BY MOUTH ONCE DAILY. 30 tablet PRN  . risperiDONE (RISPERDAL) 1 MG tablet Take 1 mg by mouth 2 (two) times daily.      . SENEXON-S 8.6-50 MG per tablet TAKE 1 TABLET BY MOUTH ONCE DAILY. 30 tablet PRN  . simvastatin (ZOCOR) 20 MG tablet TAKE 1 TABLET BY MOUTH AT BEDTIME FOR CHOLESTEROL. 30 tablet 1   No current facility-administered medications for this visit.    Allergies:   Review of patient's allergies indicates no known allergies.    Social History:  The patient  reports that he has never smoked. He has never used smokeless tobacco. He reports that he does not drink alcohol or use illicit drugs.   Family History:  The patient's family history is not known as his biologic family are not known.   ROS:  Please see the history of present illness.   Otherwise, review of systems are positive for none.   All other systems are reviewed and negative.    PHYSICAL EXAM: VS:  BP 100/66 mmHg  Pulse 76  Ht 5\' 6"  (1.676 m)  Wt 140 lb 6.4 oz (63.685 kg)  BMI 22.67 kg/m2  SpO2 98% , BMI Body mass index is 22.67 kg/(m^2). GEN: Well nourished, well developed, in no acute distress HEENT: normal Neck: no JVD, carotid bruits, or masses Cardiac: RRR; no  rubs, or gallops,no edema . There is a harsh 3/6 crescendo decrescendo systolic murmur in the left sternal border  and the base of the heart. There is also 2/6 holosystolic murmur at the apex radiating to the axilla Respiratory:  clear to auscultation bilaterally, normal work of breathing GI: soft, nontender, nondistended, + BS MS: no deformity or atrophy Skin: warm and dry, no rash Neuro:  Strength and sensation are intact Psych: euthymic mood, full affect   EKG:  EKG is ordered today. The ekg ordered today demonstrates sinus rhythm with left ventricular hypertrophy with repolarization abnormalities.   Recent Labs: 01/04/2016: ALT 7; BUN 9; Creatinine, Ser 0.83; Platelets 181; Potassium 4.2; Sodium 136    Lipid Panel    Component Value Date/Time   CHOL 159 07/11/2015 0906   CHOL 188 01/12/2015 1017   TRIG 172* 07/11/2015 0906   HDL 37* 07/11/2015 0906    CHOLHDL 4.3 07/11/2015 0906   LDLCALC 88 07/11/2015 0906      Wt Readings from Last 3 Encounters:  02/16/16 140 lb 6.4 oz (63.685 kg)  07/11/15 144 lb (65.318 kg)  01/12/15 147 lb (66.679 kg)       ASSESSMENT AND PLAN:  1.  Undiagnosed cardiac murmur: The patient has 2 different types of murmurs. He could not perform Valsalva maneuver correctly during my exam but most likely he has an outflow obstruction with mitral regurgitation. Based on this and his EKG, I suspect that he has some form of hypertrophic cardiomyopathy. I requested an echocardiogram for evaluation.  2. Severe mental retardation.   Disposition:   FU with me as needed based on echo results.  Signed,  Lorine Bears, MD  02/16/2016 10:44 AM    [facility]

## 2016-02-16 NOTE — Patient Instructions (Addendum)
Medication Instructions:  Your physician recommends that you continue on your current medications as directed. Please refer to the Current Medication list given to you today.   Labwork: none  Testing/Procedures: Your physician has requested that you have an echocardiogram. Echocardiography is a painless test that uses sound waves to create images of your heart. It provides your doctor with information about the size and shape of your heart and how well your heart's chambers and valves are working. This procedure takes approximately one hour. There are no restrictions for this procedure.    Follow-Up: Your physician recommends that you schedule a follow-up appointment as needed.    Any Other Special Instructions Will Be Listed Below (If Applicable).     If you need a refill on your cardiac medications before your next appointment, please call your pharmacy.  Echocardiogram An echocardiogram, or echocardiography, uses sound waves (ultrasound) to produce an image of your heart. The echocardiogram is simple, painless, obtained within a short period of time, and offers valuable information to your health care provider. The images from an echocardiogram can provide information such as:  Evidence of coronary artery disease (CAD).  Heart size.  Heart muscle function.  Heart valve function.  Aneurysm detection.  Evidence of a past heart attack.  Fluid buildup around the heart.  Heart muscle thickening.  Assess heart valve function. LET YOUR HEALTH CARE PROVIDER KNOW ABOUT:  Any allergies you have.  All medicines you are taking, including vitamins, herbs, eye drops, creams, and over-the-counter medicines.  Previous problems you or members of your family have had with the use of anesthetics.  Any blood disorders you have.  Previous surgeries you have had.  Medical conditions you have.  Possibility of pregnancy, if this applies. BEFORE THE PROCEDURE  No special  preparation is needed. Eat and drink normally.  PROCEDURE   In order to produce an image of your heart, gel will be applied to your chest and a wand-like tool (transducer) will be moved over your chest. The gel will help transmit the sound waves from the transducer. The sound waves will harmlessly bounce off your heart to allow the heart images to be captured in real-time motion. These images will then be recorded.  You may need an IV to receive a medicine that improves the quality of the pictures. AFTER THE PROCEDURE You may return to your normal schedule including diet, activities, and medicines, unless your health care provider tells you otherwise.   This information is not intended to replace advice given to you by your health care provider. Make sure you discuss any questions you have with your health care provider.   Document Released: 07/13/2000 Document Revised: 08/06/2014 Document Reviewed: 03/23/2013 Elsevier Interactive Patient Education 2016 [employer] Inc[employer]  

## 2016-02-22 DIAGNOSIS — F919 Conduct disorder, unspecified: Secondary | ICD-10-CM | POA: Diagnosis not present

## 2016-02-23 ENCOUNTER — Encounter: Payer: Self-pay | Admitting: Family Medicine

## 2016-02-23 ENCOUNTER — Ambulatory Visit (INDEPENDENT_AMBULATORY_CARE_PROVIDER_SITE_OTHER): Payer: Medicare Other | Admitting: Family Medicine

## 2016-02-23 VITALS — BP 133/69 | HR 74 | Temp 97.6°F | Ht 66.3 in | Wt 140.0 lb

## 2016-02-23 DIAGNOSIS — E785 Hyperlipidemia, unspecified: Secondary | ICD-10-CM | POA: Diagnosis not present

## 2016-02-23 DIAGNOSIS — Z0000 Encounter for general adult medical examination without abnormal findings: Secondary | ICD-10-CM

## 2016-02-23 DIAGNOSIS — G40009 Localization-related (focal) (partial) idiopathic epilepsy and epileptic syndromes with seizures of localized onset, not intractable, without status epilepticus: Secondary | ICD-10-CM

## 2016-02-23 DIAGNOSIS — R011 Cardiac murmur, unspecified: Secondary | ICD-10-CM | POA: Diagnosis not present

## 2016-02-23 DIAGNOSIS — R5382 Chronic fatigue, unspecified: Secondary | ICD-10-CM | POA: Diagnosis not present

## 2016-02-23 DIAGNOSIS — N400 Enlarged prostate without lower urinary tract symptoms: Secondary | ICD-10-CM

## 2016-02-23 DIAGNOSIS — F79 Unspecified intellectual disabilities: Secondary | ICD-10-CM

## 2016-02-23 MED ORDER — FLUTICASONE PROPIONATE 50 MCG/ACT NA SUSP: 2.0000 | Freq: Every day | NASAL | 12 refills | Status: DC

## 2016-02-23 MED ORDER — CARBAMAZEPINE 200 MG PO TABS
200.0000 mg | ORAL_TABLET | Freq: Three times a day (TID) | ORAL | 12 refills | Status: DC
Start: 2016-02-23 — End: 2016-07-02

## 2016-02-23 MED ORDER — FENOFIBRATE 145 MG PO TABS: 145.0000 mg | ORAL_TABLET | Freq: Every day | ORAL | 12 refills | Status: DC

## 2016-02-23 MED ORDER — DIVALPROEX SODIUM ER 500 MG PO TB24: 1500.0000 mg | ORAL_TABLET | Freq: Every day | ORAL | 12 refills | Status: DC

## 2016-02-23 NOTE — Assessment & Plan Note (Signed)
Followed by cardiology 

## 2016-02-23 NOTE — Assessment & Plan Note (Signed)
The current medical regimen is effective;  continue present plan and medications.  

## 2016-02-23 NOTE — Progress Notes (Signed)
BP 133/69 (BP Location: Left Arm, Patient Position: Sitting, Cuff Size: Normal)   Pulse 74   Temp 97.6 F (36.4 C)   Ht 5' 6.3" (1.684 m)   Wt 140 lb (63.5 kg)   SpO2 98%   BMI 22.39 kg/m    Subjective:    Patient ID: Raymond [name], male    DOB: 09-04-1960, 55 y.o.   MRN: [id number]  HPI: Raymond [name] is a 55 y.o. male  Chief Complaint  Patient presents with  . Annual Exam  Patient accompanied by caregiver who assists with history no further seizures or issues Mental condition is stable and medications are doing well no complaints on cholesterol and no abdominal concerns no side effects takes medications faithfully isn't supervised situation.  Relevant past medical, surgical, family and social history reviewed and updated as indicated. Interim medical history since our last visit reviewed. Allergies and medications reviewed and updated.  Review of Systems  Constitutional: Negative.   HENT: Negative.   Eyes: Negative.   Respiratory: Negative.   Cardiovascular: Negative.   Gastrointestinal: Negative.   Endocrine: Negative.   Genitourinary: Negative.   Musculoskeletal: Negative.   Skin: Negative.   Allergic/Immunologic: Negative.   Neurological: Negative.   Hematological: Negative.   Psychiatric/Behavioral: Negative.     Per HPI unless specifically indicated above     Objective:    BP 133/69 (BP Location: Left Arm, Patient Position: Sitting, Cuff Size: Normal)   Pulse 74   Temp 97.6 F (36.4 C)   Ht 5' 6.3" (1.684 m)   Wt 140 lb (63.5 kg)   SpO2 98%   BMI 22.39 kg/m   Wt Readings from Last 3 Encounters:  02/23/16 140 lb (63.5 kg)  02/16/16 140 lb 6.4 oz (63.7 kg)  07/11/15 144 lb (65.3 kg)    Physical Exam  Constitutional: He is oriented to person, place, and time. He appears well-developed and well-nourished.  HENT:  Head: Normocephalic and atraumatic.  Right Ear: External ear normal.  Left Ear: External ear normal.  Eyes: Conjunctivae and  EOM are normal. Pupils are equal, round, and reactive to light.  Neck: Normal range of motion. Neck supple.  Cardiovascular: Normal rate, regular rhythm and intact distal pulses.   Murmur heard. 2/6 systolic murmur  Pulmonary/Chest: Effort normal and breath sounds normal.  Abdominal: Soft. Bowel sounds are normal. There is no splenomegaly or hepatomegaly.  Genitourinary: Penis normal.  Genitourinary Comments: Unable to perform exam due to lack of cooperation  Musculoskeletal: Normal range of motion.  Neurological: He is alert and oriented to person, place, and time. He has normal reflexes.  Skin: No rash noted. No erythema.  Psychiatric: He has a normal mood and affect. His behavior is normal. Judgment and thought content normal.    Results for orders placed or performed in visit on 01/04/16  CBC with Differential/Platelet  Result Value Ref Range   WBC 10.8 3.4 - 10.8 x10E3/uL   RBC 4.02 (L) 4.14 - 5.80 x10E6/uL   Hemoglobin 12.1 (L) 12.6 - 17.7 g/dL   Hematocrit 40.9 (L) 81.1 - 51.0 %   MCV 91 79 - 97 fL   MCH 30.1 26.6 - 33.0 pg   MCHC 33.2 31.5 - 35.7 g/dL   RDW 91.4 78.2 - 95.6 %   Platelets 181 150 - 379 x10E3/uL   Neutrophils 63 %   Lymphs 26 %   Monocytes 10 %   Eos 1 %   Basos 0 %   Neutrophils  Absolute 6.9 1.4 - 7.0 x10E3/uL   Lymphocytes Absolute 2.8 0.7 - 3.1 x10E3/uL   Monocytes Absolute 1.0 (H) 0.1 - 0.9 x10E3/uL   EOS (ABSOLUTE) 0.1 0.0 - 0.4 x10E3/uL   Basophils Absolute 0.0 0.0 - 0.2 x10E3/uL   Immature Granulocytes 0 %   Immature Grans (Abs) 0.0 0.0 - 0.1 x10E3/uL  Comprehensive metabolic panel  Result Value Ref Range   Glucose 79 65 - 99 mg/dL   BUN 9 6 - 24 mg/dL   Creatinine, Ser 7.34 0.76 - 1.27 mg/dL   GFR calc non Af Amer 100 >59 mL/min/1.73   GFR calc Af Amer 116 >59 mL/min/1.73   BUN/Creatinine Ratio 11 9 - 20   Sodium 136 134 - 144 mmol/L   Potassium 4.2 3.5 - 5.2 mmol/L   Chloride 98 96 - 106 mmol/L   CO2 21 18 - 29 mmol/L   Calcium 9.5  8.7 - 10.2 mg/dL   Total Protein 7.0 6.0 - 8.5 g/dL   Albumin 4.5 3.5 - 5.5 g/dL   Globulin, Total 2.5 1.5 - 4.5 g/dL   Albumin/Globulin Ratio 1.8 1.2 - 2.2   Bilirubin Total 0.2 0.0 - 1.2 mg/dL   Alkaline Phosphatase 45 39 - 117 IU/L   AST 14 0 - 40 IU/L   ALT 7 0 - 44 IU/L      Assessment & Plan:   Problem List Items Addressed This Visit      Nervous and Auditory   Epileptic seizures (HCC)    The current medical regimen is effective;  continue present plan and medications.         Other   Mental retardation    Stable in care home      Hyperlipidemia    The current medical regimen is effective;  continue present plan and medications.       Relevant Medications   fenofibrate (TRICOR) 145 MG tablet   Heart murmur    Followed by cardiology      RESOLVED: Fatigue   Relevant Orders   CBC with Differential/Platelet   TSH    Other Visit Diagnoses    Well adult exam    -  Primary   Relevant Orders   Urinalysis, Routine w reflex microscopic (not at [facility])   CBC with Differential/Platelet   Lipid Panel w/o Chol/HDL Ratio   Comprehensive metabolic panel   PSA   TSH   Routine health maintenance       Relevant Orders   Hepatitis C Antibody   Enlarged prostate       Relevant Orders   PSA       Follow up plan: Return in about 6 months (around 08/25/2016), or if symptoms worsen or fail to improve, for bmp.

## 2016-02-23 NOTE — Assessment & Plan Note (Signed)
Stable in care home 

## 2016-02-24 ENCOUNTER — Telehealth: Payer: Self-pay | Admitting: Family Medicine

## 2016-02-24 LAB — COMPREHENSIVE METABOLIC PANEL
ALK PHOS: 46 IU/L (ref 39–117)
ALT: 11 IU/L (ref 0–44)
AST: 21 IU/L (ref 0–40)
Albumin/Globulin Ratio: 1.7 (ref 1.2–2.2)
Albumin: 4.6 g/dL (ref 3.5–5.5)
BILIRUBIN TOTAL: 0.2 mg/dL (ref 0.0–1.2)
BUN/Creatinine Ratio: 16 (ref 9–20)
BUN: 13 mg/dL (ref 6–24)
CHLORIDE: 101 mmol/L (ref 96–106)
CO2: 23 mmol/L (ref 18–29)
CREATININE: 0.79 mg/dL (ref 0.76–1.27)
Calcium: 9.6 mg/dL (ref 8.7–10.2)
GFR calc Af Amer: 119 mL/min/{1.73_m2} (ref >59)
GFR calc non Af Amer: 103 mL/min/{1.73_m2} (ref >59)
GLUCOSE: 96 mg/dL (ref 65–99)
Globulin, Total: 2.7 g/dL (ref 1.5–4.5)
Potassium: 4 mmol/L (ref 3.5–5.2)
Sodium: 138 mmol/L (ref 134–144)
Total Protein: 7.3 g/dL (ref 6.0–8.5)

## 2016-02-24 LAB — CBC WITH DIFFERENTIAL/PLATELET
BASOS ABS: 0 10*3/uL (ref 0.0–0.2)
Basos: 0 %
EOS (ABSOLUTE): 0 10*3/uL (ref 0.0–0.4)
Eos: 1 %
HEMOGLOBIN: 12.4 g/dL — AB (ref 12.6–17.7)
Hematocrit: 38 % (ref 37.5–51.0)
IMMATURE GRANS (ABS): 0 10*3/uL (ref 0.0–0.1)
IMMATURE GRANULOCYTES: 0 %
LYMPHS: 57 %
Lymphocytes Absolute: 2.8 10*3/uL (ref 0.7–3.1)
MCH: 30.5 pg (ref 26.6–33.0)
MCHC: 32.6 g/dL (ref 31.5–35.7)
MCV: 94 fL (ref 79–97)
MONOCYTES: 9 %
Monocytes Absolute: 0.4 10*3/uL (ref 0.1–0.9)
NEUTROS PCT: 33 %
Neutrophils Absolute: 1.6 10*3/uL (ref 1.4–7.0)
PLATELETS: 195 10*3/uL (ref 150–379)
RBC: 4.06 x10E6/uL — AB (ref 4.14–5.80)
RDW: 14.3 % (ref 12.3–15.4)
WBC: 4.9 10*3/uL (ref 3.4–10.8)

## 2016-02-24 LAB — HEPATITIS C ANTIBODY: Hep C Virus Ab: <0.1 s/co ratio (ref 0.0–0.9)

## 2016-02-24 LAB — LIPID PANEL W/O CHOL/HDL RATIO
CHOLESTEROL TOTAL: 203 mg/dL — AB (ref 100–199)
HDL: 22 mg/dL — ABNORMAL LOW (ref >39)
LDL CALC: 112 mg/dL — AB (ref 0–99)
TRIGLYCERIDES: 344 mg/dL — AB (ref 0–149)
VLDL CHOLESTEROL CAL: 69 mg/dL — AB (ref 5–40)

## 2016-02-24 LAB — PSA: PROSTATE SPECIFIC AG, SERUM: 0.3 ng/mL (ref 0.0–4.0)

## 2016-02-24 LAB — TSH: TSH: 2.74 u[IU]/mL (ref 0.450–4.500)

## 2016-02-24 MED ORDER — DIVALPROEX SODIUM ER 500 MG PO TB24: 500.0000 mg | ORAL_TABLET | Freq: Three times a day (TID) | ORAL | 12 refills | Status: DC

## 2016-02-24 NOTE — Telephone Encounter (Signed)
[facility] called has a question about the directions on pt's Depakote RX not matching previous prescriptions for the same RX. Please call the pharmacy ASAP. Thanks.

## 2016-02-24 NOTE — Telephone Encounter (Signed)
Rx changed and sent over to [facility]

## 2016-02-24 NOTE — Telephone Encounter (Signed)
Spoke with pharmacy, the Rx for Depakote ER 500mg  Tabs was written yesterday for 3tabs daily, historically he has taken this medication 1 tab TID,  Note states no changes to medications.  Can you please re write Rx for   1 Tab TID   [facility]

## 2016-02-24 NOTE — Telephone Encounter (Signed)
Raymond [name] @ [address] [facility] was returning a call.

## 2016-02-27 ENCOUNTER — Encounter: Payer: Self-pay | Admitting: Family Medicine

## 2016-02-27 NOTE — Telephone Encounter (Signed)
Rx issue resolved with pharmacy

## 2016-02-28 ENCOUNTER — Other Ambulatory Visit: Payer: Medicare Other

## 2016-02-28 DIAGNOSIS — Z0000 Encounter for general adult medical examination without abnormal findings: Secondary | ICD-10-CM

## 2016-02-28 LAB — URINALYSIS, ROUTINE W REFLEX MICROSCOPIC
Bilirubin, UA: NEGATIVE
Glucose, UA: NEGATIVE
Ketones, UA: NEGATIVE
LEUKOCYTES UA: NEGATIVE
NITRITE UA: NEGATIVE
PH UA: 5.5 (ref 5.0–7.5)
Protein, UA: NEGATIVE
RBC UA: NEGATIVE
SPEC GRAV UA: 1.015 (ref 1.005–1.030)
Urobilinogen, Ur: 0.2 mg/dL (ref 0.2–1.0)

## 2016-02-29 DIAGNOSIS — R569 Unspecified convulsions: Secondary | ICD-10-CM | POA: Diagnosis not present

## 2016-02-29 DIAGNOSIS — Z79899 Other long term (current) drug therapy: Secondary | ICD-10-CM | POA: Diagnosis not present

## 2016-02-29 DIAGNOSIS — R410 Disorientation, unspecified: Secondary | ICD-10-CM | POA: Diagnosis not present

## 2016-02-29 DIAGNOSIS — R413 Other amnesia: Secondary | ICD-10-CM | POA: Diagnosis not present

## 2016-02-29 DIAGNOSIS — E559 Vitamin D deficiency, unspecified: Secondary | ICD-10-CM | POA: Diagnosis not present

## 2016-03-07 ENCOUNTER — Ambulatory Visit (INDEPENDENT_AMBULATORY_CARE_PROVIDER_SITE_OTHER): Payer: Medicare Other

## 2016-03-07 ENCOUNTER — Other Ambulatory Visit: Payer: Self-pay | Admitting: Neurology

## 2016-03-07 ENCOUNTER — Other Ambulatory Visit: Payer: Self-pay

## 2016-03-07 DIAGNOSIS — R569 Unspecified convulsions: Secondary | ICD-10-CM

## 2016-03-07 DIAGNOSIS — R011 Cardiac murmur, unspecified: Secondary | ICD-10-CM | POA: Diagnosis not present

## 2016-03-16 DIAGNOSIS — R569 Unspecified convulsions: Secondary | ICD-10-CM | POA: Diagnosis not present

## 2016-03-19 ENCOUNTER — Telehealth: Payer: Self-pay | Admitting: Family Medicine

## 2016-03-19 MED ORDER — LORATADINE 10 MG PO TABS: 10.0000 mg | ORAL_TABLET | Freq: Every day | ORAL | 11 refills | Status: DC

## 2016-03-19 MED ORDER — DM-GUAIFENESIN ER 30-600 MG PO TB12: 1.0000 | ORAL_TABLET | Freq: Two times a day (BID) | ORAL | 1 refills | Status: DC | PRN

## 2016-03-19 NOTE — Telephone Encounter (Signed)
Amada JupiterDale from [facility] called and stated the pt has head congestion and gunky eyes. Amada JupiterDale would like to know if something can be called in for the patient. Pharm is [facility] in [place]. Amada Jupiterale requested Muccinex and Claritiin for the patient. Thanks.

## 2016-03-21 ENCOUNTER — Ambulatory Visit: Payer: Medicare Other | Admitting: Nurse Practitioner

## 2016-03-21 ENCOUNTER — Ambulatory Visit
Admission: RE | Admit: 2016-03-21 | Discharge: 2016-03-21 | Disposition: A | Discharge status: Home / Self Care | Payer: Medicare Other | Source: Ambulatory Visit | Attending: Neurology | Admitting: Neurology

## 2016-03-21 DIAGNOSIS — R569 Unspecified convulsions: Secondary | ICD-10-CM | POA: Diagnosis not present

## 2016-03-21 MED ORDER — GADOBENATE DIMEGLUMINE 529 MG/ML IV SOLN
13.0000 mL | Freq: Once | INTRAVENOUS | Status: AC | PRN
  Administered 2016-03-21: 13 mL via INTRAVENOUS

## 2016-03-23 ENCOUNTER — Encounter: Payer: Self-pay | Admitting: Family Medicine

## 2016-03-23 ENCOUNTER — Ambulatory Visit (INDEPENDENT_AMBULATORY_CARE_PROVIDER_SITE_OTHER): Payer: Medicare Other | Admitting: Family Medicine

## 2016-03-23 VITALS — BP 116/77 | HR 83 | Temp 97.5°F | Wt 142.0 lb

## 2016-03-23 DIAGNOSIS — H109 Unspecified conjunctivitis: Secondary | ICD-10-CM | POA: Diagnosis not present

## 2016-03-23 DIAGNOSIS — L309 Dermatitis, unspecified: Secondary | ICD-10-CM

## 2016-03-23 MED ORDER — TRIAMCINOLONE ACETONIDE 0.1 % EX CREA: 1.0000 "application " | TOPICAL_CREAM | Freq: Two times a day (BID) | CUTANEOUS | 0 refills | Status: DC

## 2016-03-23 MED ORDER — ERYTHROMYCIN 5 MG/GM OP OINT: 1.0000 "application " | TOPICAL_OINTMENT | Freq: Every day | OPHTHALMIC | 0 refills | Status: DC

## 2016-03-23 MED ORDER — DIPHENHYDRAMINE HCL 25 MG PO TABS: 25.0000 mg | ORAL_TABLET | Freq: Four times a day (QID) | ORAL | 0 refills | Status: DC | PRN

## 2016-03-23 NOTE — Progress Notes (Addendum)
BP 116/77   Pulse 83   Temp 97.5 F (36.4 C)   Wt 142 lb (64.4 kg)   SpO2 98%   BMI 22.71 kg/m    Subjective:    Patient ID: Raymond [name], male    DOB: 20-Jun-1961, 55 y.o.   MRN: [id number]  HPI: Raymond [name] is a 55 y.o. male  Chief Complaint  Patient presents with  . Eye Problem    x 1 week. bilateral eyes, left eye worse. draining, red, and irritated  . Other    spots on his face for face for a few days, they are improving. Doesn't seen to be bothering him or itching.   Patient presents today with his group home director, who provides all of the history. Vision was not performed today as he has a mental delay and is unable to participate. He presents with 1 week history of b/l eye redness, draining, and crusting. Unable to elicit whether they itch or burn, but he indicates the light does not hurt them and that he can see fine. Denies fever, chills, headache, N/V.   Also c/o some red bumps that have appeared on his face, mainly beard areas (chin and b/l cheeks) that have scabbed over in some places. Difficult to elicit whether it itches or hurts. Has not tried anything for symptoms.   Relevant past medical, surgical, family and social history reviewed and updated as indicated. Interim medical history since our last visit reviewed. Allergies and medications reviewed and updated.  Review of Systems  Constitutional: Negative.   HENT: Negative.   Eyes: Positive for discharge and redness.  Respiratory: Negative.   Cardiovascular: Negative.   Gastrointestinal: Negative.   Musculoskeletal: Negative.   Skin: Positive for rash.  Neurological: Negative.   Psychiatric/Behavioral: Negative.     Per HPI unless specifically indicated above     Objective:    BP 116/77   Pulse 83   Temp 97.5 F (36.4 C)   Wt 142 lb (64.4 kg)   SpO2 98%   BMI 22.71 kg/m   Wt Readings from Last 3 Encounters:  03/23/16 142 lb (64.4 kg)  02/23/16 140 lb (63.5 kg)  02/16/16 140 lb  6.4 oz (63.7 kg)    Physical Exam  Constitutional: He is oriented to person, place, and time. He appears well-developed and well-nourished.  HENT:  Head: Atraumatic.  Nose: Nose normal.  Mouth/Throat: Oropharynx is clear and moist. No oropharyngeal exudate.  Eyes: Pupils are equal, round, and reactive to light. Right eye exhibits discharge. Left eye exhibits discharge.  Conjunctiva injected b/l Crusting around b/l eyelids  Neck: Normal range of motion. Neck supple.  Cardiovascular: Normal rate.   Murmur heard. Pulmonary/Chest: Effort normal and breath sounds normal. No respiratory distress.  Musculoskeletal: Normal range of motion.  Lymphadenopathy:    He has no cervical adenopathy.  Neurological: He is alert and oriented to person, place, and time.  Skin: Skin is warm and dry.  Several erythematous papules sporadic across b/l cheeks and face, some scabbed over.   Psychiatric: He has a normal mood and affect. His behavior is normal.  Nursing note and vitals reviewed.       Assessment & Plan:   Problem List Items Addressed This Visit    None    Visit Diagnoses    Bilateral conjunctivitis    -  Primary   Erythromycin ointment sent. Warm compresses, discussed washing hands each time eyes are touched.    Facial dermatitis  Triamcinolone cream sent. Likely irritation from shaving or sweating. Recommended washing with hibiclens once a day for the next few days.       Follow up plan: Return if symptoms worsen or fail to improve.

## 2016-03-23 NOTE — Patient Instructions (Addendum)
Follow up as needed

## 2016-04-05 ENCOUNTER — Ambulatory Visit (INDEPENDENT_AMBULATORY_CARE_PROVIDER_SITE_OTHER): Payer: Medicare Other | Admitting: Cardiovascular Disease

## 2016-04-05 ENCOUNTER — Ambulatory Visit: Payer: Medicare Other | Admitting: Cardiovascular Disease

## 2016-04-05 ENCOUNTER — Encounter: Payer: Self-pay | Admitting: Cardiovascular Disease

## 2016-04-05 VITALS — BP 114/62 | HR 84 | Ht 63.0 in | Wt 147.5 lb

## 2016-04-05 DIAGNOSIS — I340 Nonrheumatic mitral (valve) insufficiency: Secondary | ICD-10-CM

## 2016-04-05 NOTE — Patient Instructions (Signed)
Medication Instructions: No change.   Labwork: None.   Procedures/Testing: None.   Follow-Up: 6 months with Dr. Arida.   Any Additional Special Instructions Will Be Listed Below (If Applicable).     If you need a refill on your cardiac medications before your next appointment, please call your pharmacy.   

## 2016-04-05 NOTE — Progress Notes (Signed)
Cardiology Office Note   Date:  04/05/2016   ID:  Raymond RobCharles T Migliaccio, DOB 27-Aug-1960, MRN [id number]  PCP:  Vonita MossMark Crissman, MD  Cardiologist:   Lorine BearsMuhammad Gregery Walberg, MD   Chief Complaint  Patient presents with  . Other    F/u echo. Meds reviewed verbally with pt.      History of Present Illness: Raymond [name] is a 55 y.o. male who is here for a follow up visit regarding recently diagnosed severe mitral regurgitation due to MVP.  The patient has known history of severe mental retardation, seizure disorder and hyperlipidemia. He lives in an adult care home.  He was seen recently for a cardiac murmur. An echocardiogram was done which showed normal LV systolic function, severely dilated left ventricle, severe prolapse of the posterior mitral leaflet with severe mitral regurgitation and severely dilated left atrium. Pulmonary pressure could not be estimated. The patient denies any chest pain, shortness of breath, orthopnea or leg edema.   Past Medical History:  Diagnosis Date  . Epileptic seizures (HCC)   . Heart murmur    systolic  . Hyperlipidemia   . Mental retardation   . OCD (obsessive compulsive disorder)     History reviewed. No pertinent surgical history.   Current Outpatient Prescriptions  Medication Sig Dispense Refill  . ASPIRIN LOW DOSE 81 MG EC tablet TAKE 1 TABLET BY MOUTH ONCE DAILY. 30 tablet PRN  . carbamazepine (TEGRETOL) 200 MG tablet Take 1 tablet (200 mg total) by mouth 3 (three) times daily. 90 tablet 12  . Cyanocobalamin (RA VITAMIN B-12 TR) 1000 MCG TBCR Take 1,000 mcg by mouth daily.    Marland Kitchen. dextromethorphan-guaiFENesin (MUCINEX DM) 30-600 MG 12hr tablet Take 1 tablet by mouth 2 (two) times daily as needed for cough. 60 tablet 1  . diphenhydrAMINE (BENADRYL) 25 MG tablet Take 1 tablet (25 mg total) by mouth every 6 (six) hours as needed. 30 tablet 0  . divalproex (DEPAKOTE ER) 500 MG 24 hr tablet Take 1 tablet (500 mg total) by mouth 3 (three) times daily. 90  tablet 12  . erythromycin ophthalmic ointment Place 1 application into both eyes at bedtime. 3.5 g 0  . fenofibrate (TRICOR) 145 MG tablet Take 1 tablet (145 mg total) by mouth daily. 30 tablet 12  . fluticasone (FLONASE) 50 MCG/ACT nasal spray Place 2 sprays into both nostrils daily. Use PRN 3 g 12  . loratadine (CLARITIN) 10 MG tablet Take 1 tablet (10 mg total) by mouth daily. 30 tablet 11  . LORazepam (ATIVAN) 0.5 MG tablet Take 0.5 mg by mouth daily as needed for anxiety.    . meclizine (ANTIVERT) 25 MG tablet Take 1 tablet (25 mg total) by mouth 3 (three) times daily as needed for dizziness. 30 tablet 0  . risperiDONE (RISPERDAL) 1 MG tablet Take 1 mg by mouth 2 (two) times daily.    . SENEXON-S 8.6-50 MG per tablet TAKE 1 TABLET BY MOUTH ONCE DAILY. 30 tablet PRN  . triamcinolone cream (KENALOG) 0.1 % Apply 1 application topically 2 (two) times daily. 30 g 0  . Vitamin D, Ergocalciferol, (DRISDOL) 50000 units CAPS capsule Take 50,000 Units by mouth once a week.     No current facility-administered medications for this visit.     Allergies:   Review of patient's allergies indicates no known allergies.    Social History:  The patient  reports that he has never smoked. He has never used smokeless tobacco. He reports that he  does not drink alcohol or use drugs.   Family History:  The patient's family history is not known as his biologic family are not known.   ROS:  Please see the history of present illness.   Otherwise, review of systems are positive for none.   All other systems are reviewed and negative.    PHYSICAL EXAM: VS:  BP 114/62 (BP Location: Left Arm, Patient Position: Sitting, Cuff Size: Normal)   Pulse 84   Ht 5\' 3"  (1.6 m)   Wt 147 lb 8 oz (66.9 kg)   BMI 26.13 kg/m  , BMI Body mass index is 26.13 kg/m. GEN: Well nourished, well developed, in no acute distress  HEENT: normal  Neck: no JVD, carotid bruits, or masses Cardiac: RRR; no  rubs, or gallops,no edema .  There is a harsh 3/6 crescendo decrescendo systolic murmur in the left sternal border and the base of the heart. There is also 3/6 holosystolic murmur at the apex radiating to the axilla Respiratory:  clear to auscultation bilaterally, normal work of breathing GI: soft, nontender, nondistended, + BS MS: no deformity or atrophy  Skin: warm and dry, no rash Neuro:  Strength and sensation are intact Psych: euthymic mood, full affect   EKG:  EKG is not ordered today.    Recent Labs: 02/23/2016: ALT 11; BUN 13; Creatinine, Ser 0.79; Platelets 195; Potassium 4.0; Sodium 138; TSH 2.740    Lipid Panel    Component Value Date/Time   CHOL 203 (H) 02/23/2016 1410   CHOL 188 01/12/2015 1017   TRIG 344 (H) 02/23/2016 1410   HDL 22 (L) 02/23/2016 1410   CHOLHDL 4.3 07/11/2015 0906   LDLCALC 112 (H) 02/23/2016 1410      Wt Readings from Last 3 Encounters:  04/05/16 147 lb 8 oz (66.9 kg)  03/23/16 142 lb (64.4 kg)  02/23/16 140 lb (63.5 kg)       ASSESSMENT AND PLAN:  1.   Severe mitral regurgitation due to mitral valve prolapse: This is somewhat a difficult situation given the patient's severe mental retardation. It's difficult to obtain an accurate history but he does not usually complain of any symptoms. He appears to be asymptomatic from this. The patient is accompanied today by his Child psychotherapist and they report that he has good quality of life and overall he is happy person and does not complain of anything. I favor following up his mitral regurgitation clinically and with echocardiograms before deciding on surgical intervention. I asked them to monitor for any symptoms and follow-up with me in 6 months.   2. Severe mental retardation.   3. Seizure disorder: Controlled with medications.  Disposition:   FU with me in 6 months   Signed,  Lorine Bears, MD  04/05/2016 3:47 PM    [facility]

## 2016-04-09 ENCOUNTER — Encounter: Payer: Self-pay | Admitting: Family Medicine

## 2016-04-09 ENCOUNTER — Ambulatory Visit (INDEPENDENT_AMBULATORY_CARE_PROVIDER_SITE_OTHER): Payer: Medicare Other | Admitting: Family Medicine

## 2016-04-09 VITALS — BP 107/65 | HR 83 | Temp 97.6°F | Wt 142.0 lb

## 2016-04-09 DIAGNOSIS — S022XXA Fracture of nasal bones, initial encounter for closed fracture: Secondary | ICD-10-CM | POA: Diagnosis not present

## 2016-04-09 NOTE — Progress Notes (Signed)
BP 107/65 (BP Location: Left Arm, Patient Position: Sitting, Cuff Size: Small)   Pulse 83   Temp 97.6 F (36.4 C)   Wt 142 lb (64.4 kg)   SpO2 96%   BMI 25.15 kg/m    Subjective:    Patient ID: Raymond [name], male    DOB: 1961/04/05, 55 y.o.   MRN: [id number]  HPI: Raymond [name] is a 55 y.o. male  Chief Complaint  Patient presents with  . Fall  Patient fell this morning striking his forhead and nose. History provided by caregiver as patient with marked mental retardation There is no loss of consciousness patient has felt well since was able to eat breakfast and gone about his stay. Patient has had no change in mental status no change in visual status patient has esyotrophia  with no change in the status of his gaze.  His caregiver has noticed his nose is now crooked and was formally straight. No complaints of nosebleeds   Relevant past medical, surgical, family and social history reviewed and updated as indicated. Interim medical history since our last visit reviewed. Allergies and medications reviewed and updated.  Review of Systems  Constitutional: Negative.   Eyes: Negative for visual disturbance.  Respiratory: Negative.   Cardiovascular: Negative.     Per HPI unless specifically indicated above     Objective:    BP 107/65 (BP Location: Left Arm, Patient Position: Sitting, Cuff Size: Small)   Pulse 83   Temp 97.6 F (36.4 C)   Wt 142 lb (64.4 kg)   SpO2 96%   BMI 25.15 kg/m   Wt Readings from Last 3 Encounters:  04/09/16 142 lb (64.4 kg)  04/05/16 147 lb 8 oz (66.9 kg)  03/23/16 142 lb (64.4 kg)    Physical Exam  Constitutional: He is oriented to person, place, and time. He appears well-developed and well-nourished. No distress.  HENT:  Head: Normocephalic and atraumatic.  Right Ear: Hearing normal.  Left Ear: Hearing normal.  Nose: Nose normal.  Nose crooked and bent to the right  Eyes: Conjunctivae and lids are normal. Right eye exhibits no  discharge. Left eye exhibits no discharge. No scleral icterus.  Cardiovascular: Regular rhythm.   Pulmonary/Chest: Effort normal and breath sounds normal. No respiratory distress.  Musculoskeletal: Normal range of motion.  Neurological: He is alert and oriented to person, place, and time.  Skin: Skin is intact. No rash noted.  Abrasions on forehead and nose with contusions  Psychiatric: He has a normal mood and affect. His speech is normal and behavior is normal. Judgment and thought content normal. Cognition and memory are normal.    Results for orders placed or performed in visit on 02/28/16  Urinalysis, Routine w reflex microscopic (not [facility])  Result Value Ref Range   Specific Gravity, UA 1.015 1.005 - 1.030   pH, UA 5.5 5.0 - 7.5   Color, UA Yellow Yellow   Appearance Ur Clear Clear   Leukocytes, UA Negative Negative   Protein, UA Negative Negative/Trace   Glucose, UA Negative Negative   Ketones, UA Negative Negative   RBC, UA Negative Negative   Bilirubin, UA Negative Negative   Urobilinogen, Ur 0.2 0.2 - 1.0 mg/dL   Nitrite, UA Negative Negative      Assessment & Plan:   Problem List Items Addressed This Visit      Musculoskeletal and Integument   Nose fracture - Primary    Will refer to ear nose and throat  to assess straightening  Discussed contusion care and expected shiners       Other Visit Diagnoses   None.      Follow up plan: Return for As scheduled.

## 2016-04-09 NOTE — Assessment & Plan Note (Signed)
Will refer to ear nose and throat to assess straightening  Discussed contusion care and expected shiners

## 2016-04-18 DIAGNOSIS — R42 Dizziness and giddiness: Secondary | ICD-10-CM | POA: Diagnosis not present

## 2016-04-18 DIAGNOSIS — S022XXA Fracture of nasal bones, initial encounter for closed fracture: Secondary | ICD-10-CM | POA: Diagnosis not present

## 2016-05-01 DIAGNOSIS — R42 Dizziness and giddiness: Secondary | ICD-10-CM | POA: Diagnosis not present

## 2016-05-15 DIAGNOSIS — R42 Dizziness and giddiness: Secondary | ICD-10-CM | POA: Diagnosis not present

## 2016-05-15 DIAGNOSIS — S022XXD Fracture of nasal bones, subsequent encounter for fracture with routine healing: Secondary | ICD-10-CM | POA: Diagnosis not present

## 2016-05-23 DIAGNOSIS — F919 Conduct disorder, unspecified: Secondary | ICD-10-CM | POA: Diagnosis not present

## 2016-06-04 DIAGNOSIS — Z23 Encounter for immunization: Secondary | ICD-10-CM | POA: Diagnosis not present

## 2016-06-06 DIAGNOSIS — R569 Unspecified convulsions: Secondary | ICD-10-CM | POA: Diagnosis not present

## 2016-06-14 DIAGNOSIS — R262 Difficulty in walking, not elsewhere classified: Secondary | ICD-10-CM | POA: Diagnosis not present

## 2016-06-18 DIAGNOSIS — R262 Difficulty in walking, not elsewhere classified: Secondary | ICD-10-CM | POA: Diagnosis not present

## 2016-06-26 DIAGNOSIS — R262 Difficulty in walking, not elsewhere classified: Secondary | ICD-10-CM | POA: Diagnosis not present

## 2016-07-02 ENCOUNTER — Other Ambulatory Visit: Payer: Self-pay | Admitting: Family Medicine

## 2016-07-02 NOTE — Telephone Encounter (Signed)
Routing to provider. Patient still has refills on Fenofibrate, Divalproex, and Carbamazepine. Next appt is 09/05/16.

## 2016-07-06 DIAGNOSIS — R262 Difficulty in walking, not elsewhere classified: Secondary | ICD-10-CM | POA: Diagnosis not present

## 2016-07-12 DIAGNOSIS — R262 Difficulty in walking, not elsewhere classified: Secondary | ICD-10-CM | POA: Diagnosis not present

## 2016-07-22 ENCOUNTER — Emergency Department
Admission: EM | Admit: 2016-07-22 | Discharge: 2016-07-22 | Disposition: A | Discharge status: Home / Self Care | Payer: Medicare Other | Attending: Emergency Medicine | Admitting: Emergency Medicine

## 2016-07-22 ENCOUNTER — Emergency Department: Payer: Medicare Other

## 2016-07-22 ENCOUNTER — Encounter: Payer: Self-pay | Admitting: Emergency Medicine

## 2016-07-22 DIAGNOSIS — Z7982 Long term (current) use of aspirin: Secondary | ICD-10-CM | POA: Diagnosis not present

## 2016-07-22 DIAGNOSIS — R0602 Shortness of breath: Secondary | ICD-10-CM | POA: Diagnosis present

## 2016-07-22 DIAGNOSIS — Z79899 Other long term (current) drug therapy: Secondary | ICD-10-CM | POA: Insufficient documentation

## 2016-07-22 DIAGNOSIS — J189 Pneumonia, unspecified organism: Secondary | ICD-10-CM | POA: Diagnosis not present

## 2016-07-22 DIAGNOSIS — R509 Fever, unspecified: Secondary | ICD-10-CM | POA: Diagnosis not present

## 2016-07-22 DIAGNOSIS — R05 Cough: Secondary | ICD-10-CM | POA: Diagnosis not present

## 2016-07-22 LAB — COMPREHENSIVE METABOLIC PANEL
ALBUMIN: 3.7 g/dL (ref 3.5–5.0)
ALT: 18 U/L (ref 17–63)
ANION GAP: 6 (ref 5–15)
AST: 41 U/L (ref 15–41)
Alkaline Phosphatase: 27 U/L — ABNORMAL LOW (ref 38–126)
BILIRUBIN TOTAL: 0.4 mg/dL (ref 0.3–1.2)
BUN: 18 mg/dL (ref 6–20)
CALCIUM: 8.6 mg/dL — AB (ref 8.9–10.3)
CO2: 22 mmol/L (ref 22–32)
Chloride: 108 mmol/L (ref 101–111)
Creatinine, Ser: 0.97 mg/dL (ref 0.61–1.24)
Glucose, Bld: 125 mg/dL — ABNORMAL HIGH (ref 65–99)
POTASSIUM: 3.6 mmol/L (ref 3.5–5.1)
Sodium: 136 mmol/L (ref 135–145)
TOTAL PROTEIN: 6.7 g/dL (ref 6.5–8.1)

## 2016-07-22 LAB — PROTIME-INR
INR: 1.19
PROTHROMBIN TIME: 15.2 s (ref 11.4–15.2)

## 2016-07-22 LAB — URINALYSIS, COMPLETE (UACMP) WITH MICROSCOPIC
BILIRUBIN URINE: NEGATIVE
Bacteria, UA: NONE SEEN
GLUCOSE, UA: NEGATIVE mg/dL
Hgb urine dipstick: NEGATIVE
KETONES UR: NEGATIVE mg/dL
LEUKOCYTES UA: NEGATIVE
Nitrite: NEGATIVE
PH: 8 (ref 5.0–8.0)
Protein, ur: NEGATIVE mg/dL
SPECIFIC GRAVITY, URINE: 1.017 (ref 1.005–1.030)

## 2016-07-22 LAB — LACTIC ACID, PLASMA: LACTIC ACID, VENOUS: 1 mmol/L (ref 0.5–1.9)

## 2016-07-22 LAB — CBC WITH DIFFERENTIAL/PLATELET
BASOS ABS: 0 10*3/uL (ref 0–0.1)
BASOS PCT: 0 %
EOS ABS: 0 10*3/uL (ref 0–0.7)
Eosinophils Relative: 0 %
HEMATOCRIT: 33.2 % — AB (ref 40.0–52.0)
HEMOGLOBIN: 11.5 g/dL — AB (ref 13.0–18.0)
Lymphocytes Relative: 19 %
Lymphs Abs: 1.4 10*3/uL (ref 1.0–3.6)
MCH: 32.1 pg (ref 26.0–34.0)
MCHC: 34.6 g/dL (ref 32.0–36.0)
MCV: 92.8 fL (ref 80.0–100.0)
Monocytes Absolute: 1 10*3/uL (ref 0.2–1.0)
Monocytes Relative: 13 %
NEUTROS ABS: 5.1 10*3/uL (ref 1.4–6.5)
NEUTROS PCT: 68 %
Platelets: 114 10*3/uL — ABNORMAL LOW (ref 150–440)
RBC: 3.58 MIL/uL — ABNORMAL LOW (ref 4.40–5.90)
RDW: 13.7 % (ref 11.5–14.5)
WBC: 7.5 10*3/uL (ref 3.8–10.6)

## 2016-07-22 MED ORDER — LEVOFLOXACIN 750 MG PO TABS: 750.0000 mg | ORAL_TABLET | Freq: Every day | ORAL | 0 refills | Status: AC

## 2016-07-22 MED ORDER — SODIUM CHLORIDE 0.9 % IV BOLUS (SEPSIS)
1000.0000 mL | Freq: Once | INTRAVENOUS | Status: AC
  Administered 2016-07-22: 1000 mL via INTRAVENOUS

## 2016-07-22 MED ORDER — LEVOFLOXACIN IN D5W 750 MG/150ML IV SOLN
750.0000 mg | Freq: Once | INTRAVENOUS | Status: AC
  Administered 2016-07-22: 750 mg via INTRAVENOUS
  Filled 2016-07-22: qty 150

## 2016-07-22 NOTE — ED Notes (Signed)
Pharmacy to send levaquin

## 2016-07-22 NOTE — Discharge Instructions (Signed)
Please seek medical attention for any high fevers, chest pain, shortness of breath, change in behavior, persistent vomiting, bloody stool or any other new or concerning symptoms.  

## 2016-07-22 NOTE — ED Triage Notes (Addendum)
Pt living at group home and saw family today for the first time in a long time. Pt was febrile and stated he was having trouble breathing. EMS given 1000mg  tylenol for 100.5 fever.

## 2016-07-22 NOTE — ED Provider Notes (Signed)
[facility] Emergency Department Provider Note  ____________________________________________   I have reviewed the triage vital signs and the nursing notes.   HISTORY  Chief Complaint Fever and Shortness of Breath   History limited by: MR   HPI Raymond [name] is a [age] y.o. male who presents to the emergency department today because of concerns for fever and cough. History is obtained from family. They state they went to pick him up from his group home today for the holidays. Once they got him home they noticed that he was starting to cough. The patient started to have chills as well and they had a measured temperature at home of 104. Family member who is a Engineer, civil (consulting)nurse took a listened to his lungs and was concerned that they sounded bad. He is not aware that he has had pneumonias in the past. No nausea or vomiting recently that they have heard of.   Past Medical History:  Diagnosis Date  . Epileptic seizures (HCC)   . Heart murmur    systolic  . Hyperlipidemia   . Mental retardation   . OCD (obsessive compulsive disorder)     Patient Active Problem List   Diagnosis Date Noted  . Nose fracture 04/09/2016  . Epileptic seizures (HCC) 01/12/2015  . Mental retardation 01/12/2015  . OCD (obsessive compulsive disorder) 01/12/2015  . Hyperlipidemia 01/12/2015  . Heart murmur 01/12/2015    History reviewed. No pertinent surgical history.  Prior to Admission medications   Medication Sig Start Date End Date Taking? Authorizing Provider  ASPIRIN LOW DOSE 81 MG EC tablet TAKE 1 TABLET BY MOUTH ONCE DAILY. 07/02/16   Steele SizerMark A Crissman, MD  carbamazepine (TEGRETOL) 200 MG tablet TAKE 1 TABLET BY MOUTH 3 TIMES DAILY WITH MEALS. 07/02/16   Steele SizerMark A Crissman, MD  Cyanocobalamin (RA VITAMIN B-12 TR) 1000 MCG TBCR Take 1,000 mcg by mouth daily. 03/02/16 03/02/17  Historical Provider, MD  dextromethorphan-guaiFENesin (MUCINEX DM) 30-600 MG 12hr tablet Take 1 tablet by mouth 2 (two)  times daily as needed for cough. 03/19/16   Steele SizerMark A Crissman, MD  diphenhydrAMINE (BENADRYL) 25 MG tablet Take 1 tablet (25 mg total) by mouth every 6 (six) hours as needed. 03/23/16   Particia Nearingachel Elizabeth Lane, PA-C  divalproex (DEPAKOTE ER) 500 MG 24 hr tablet Take 1 tablet (500 mg total) by mouth 3 (three) times daily. 02/24/16   Particia Nearingachel Elizabeth Lane, PA-C  divalproex (DEPAKOTE) 500 MG DR tablet TAKE 1 TABLET BY MOUTH THREE TIMES DAILY WITH MEALS. 07/02/16   Steele SizerMark A Crissman, MD  erythromycin ophthalmic ointment Place 1 application into both eyes at bedtime. 03/23/16   Particia Nearingachel Elizabeth Lane, PA-C  fenofibrate (TRICOR) 145 MG tablet TAKE 1 TABLET BY MOUTH ONCE DAILY FOR LIPIDS. 07/02/16   Steele SizerMark A Crissman, MD  fluticasone (FLONASE) 50 MCG/ACT nasal spray Place 2 sprays into both nostrils daily. Use PRN 02/23/16   Steele SizerMark A Crissman, MD  loratadine (CLARITIN) 10 MG tablet Take 1 tablet (10 mg total) by mouth daily. 03/19/16   Steele SizerMark A Crissman, MD  LORazepam (ATIVAN) 0.5 MG tablet Take 0.5 mg by mouth daily as needed for anxiety.    Historical Provider, MD  meclizine (ANTIVERT) 25 MG tablet Take 1 tablet (25 mg total) by mouth 3 (three) times daily as needed for dizziness. 01/18/16   Steele SizerMark A Crissman, MD  risperiDONE (RISPERDAL) 1 MG tablet TAKE 1 TABLET BY MOUTH TWICE DAILY. 07/02/16   Steele SizerMark A Crissman, MD  SENEXON-S 8.6-50 MG  per tablet TAKE 1 TABLET BY MOUTH ONCE DAILY. 01/12/15   Steele Sizer, MD  triamcinolone cream (KENALOG) 0.1 % Apply 1 application topically 2 (two) times daily. 03/23/16   Particia Nearing, PA-C    Allergies Patient has no known allergies.  Family History  Problem Relation Age of Onset  . Family history unknown: Yes    Social History Social History  Substance Use Topics  . Smoking status: Never Smoker  . Smokeless tobacco: Never Used  . Alcohol use No    Review of Systems  Constitutional: Positive for fever Cardiovascular: Negative for chest pain. Respiratory: Positive for  cough Gastrointestinal: Negative for abdominal pain, vomiting and diarrhea. Neurological: Negative for headaches, focal weakness or numbness.  10-point ROS otherwise negative.  ____________________________________________   PHYSICAL EXAM:  VITAL SIGNS: ED Triage Vitals  Enc Vitals Group     BP 07/22/16 2021 103/65     Pulse Rate 07/22/16 2021 (!) 106     Resp 07/22/16 2021 19     Temp 07/22/16 2021 (!) 100.8 F (38.2 C)     Temp Source 07/22/16 2021 Oral     SpO2 07/22/16 2021 95 %     Weight 07/22/16 2023 161 lb (73 kg)     Height 07/22/16 2023 5\' 3"  (1.6 m)    Constitutional: Awake and alert. Minimally verbal. No acute distress. Eyes: Conjunctivae are normal. Normal extraocular movements. ENT   Head: Normocephalic and atraumatic.   Nose: No congestion/rhinnorhea.   Mouth/Throat: Mucous membranes are moist.   Neck: No stridor. Hematological/Lymphatic/Immunilogical: No cervical lymphadenopathy. Cardiovascular: Tachycardic, regular rhythm.  No murmurs, rubs, or gallops.  Respiratory: Normal respiratory effort without tachypnea nor retractions. Breath sounds are clear and equal bilaterally. Rhonchi in the right lower base. Gastrointestinal: Soft and non tender. No rebound. No guarding.  Genitourinary: Deferred Musculoskeletal: Normal range of motion in all extremities. No lower extremity edema. Neurologic:  Normal speech and language. No gross focal neurologic deficits are appreciated.  Skin:  Skin is warm, dry and intact. No rash noted. Psychiatric: Mood and affect are normal. Speech and behavior are normal. Patient exhibits appropriate insight and judgment.  ____________________________________________    LABS (pertinent positives/negatives)  Labs Reviewed  COMPREHENSIVE METABOLIC PANEL - Abnormal; Notable for the following:       Result Value   Glucose, Bld 125 (*)    Calcium 8.6 (*)    Alkaline Phosphatase 27 (*)    All other components within normal  limits  CBC WITH DIFFERENTIAL/PLATELET - Abnormal; Notable for the following:    RBC 3.58 (*)    Hemoglobin 11.5 (*)    HCT 33.2 (*)    Platelets 114 (*)    All other components within normal limits  URINALYSIS, COMPLETE (UACMP) WITH MICROSCOPIC - Abnormal; Notable for the following:    Color, Urine YELLOW (*)    APPearance CLEAR (*)    Squamous Epithelial / LPF 0-5 (*)    All other components within normal limits  CULTURE, BLOOD (ROUTINE X 2)  CULTURE, BLOOD (ROUTINE X 2)  URINE CULTURE  LACTIC ACID, PLASMA  PROTIME-INR  LACTIC ACID, PLASMA     ____________________________________________   EKG  I, Phineas Semen, attending physician, personally viewed and interpreted this EKG  EKG Time: 2027 Rate: 99 Rhythm: normal sinus rhythm with PAC Axis: normal Intervals: qtc 374 QRS: narrow, abnormal r wave progression ST changes: no st elevation Impression: abnormal ekg   ____________________________________________    RADIOLOGY  CXR IMPRESSION:  Patchy right basilar infiltrate.  ____________________________________________   PROCEDURES  Procedures  ____________________________________________   INITIAL IMPRESSION / ASSESSMENT AND PLAN / ED COURSE  Pertinent labs & imaging results that were available during my care of the patient were reviewed by me and considered in my medical decision making (see chart for details).  Patient presented to the emergency department today because of concerns for fevers, cough. Patient's workup is consistent with a right lower lobe pneumonia. No leukocytosis or elevated lactic acidosis to suggest sepsis at this point. Will plan on giving patient dose of IV antibiotics here. We will discharge with further antibiotics.  ____________________________________________   FINAL CLINICAL IMPRESSION(S) / ED DIAGNOSES  Final diagnoses:  Community acquired pneumonia of right lung, unspecified part of lung     Note: This dictation  was prepared with Office managerDragon dictation. Any transcriptional errors that result from this process are unintentional     Phineas SemenGraydon Braxxton Stoudt, MD 07/22/16 2251

## 2016-07-24 LAB — URINE CULTURE: Culture: NO GROWTH

## 2016-07-26 ENCOUNTER — Ambulatory Visit (INDEPENDENT_AMBULATORY_CARE_PROVIDER_SITE_OTHER): Payer: Medicare Other | Admitting: Family Medicine

## 2016-07-26 ENCOUNTER — Encounter: Payer: Self-pay | Admitting: Family Medicine

## 2016-07-26 VITALS — BP 132/75 | HR 92 | Temp 98.2°F | Wt 145.0 lb

## 2016-07-26 DIAGNOSIS — J189 Pneumonia, unspecified organism: Secondary | ICD-10-CM

## 2016-07-26 DIAGNOSIS — J181 Lobar pneumonia, unspecified organism: Secondary | ICD-10-CM | POA: Diagnosis not present

## 2016-07-26 MED ORDER — ALBUTEROL SULFATE HFA 108 (90 BASE) MCG/ACT IN AERS
2.0000 | INHALATION_SPRAY | Freq: Four times a day (QID) | RESPIRATORY_TRACT | 0 refills | Status: AC | PRN
Start: 2016-07-26 — End: ?

## 2016-07-26 NOTE — Progress Notes (Signed)
BP 132/75   Pulse 92   Temp 98.2 F (36.8 C)   Wt 145 lb (65.8 kg)   SpO2 98%   BMI 25.69 kg/m    Subjective:    Patient ID: Raymond [name], male    DOB: Oct 24, 1960, 55 y.o.   MRN: [id number]  HPI: Raymond RobCharles T Tena is a 55 y.o. male  Chief Complaint  Patient presents with  . Follow-up    follow up from Pneumonia, he states he is doing better. Caretaker states he still has alot of congestion.   Patient presents for hospital follow up from RLL pneumonia diagnosed on 12/24. Unable to obtain detailed interim history as pt is MR and caregiver did not come back to exam room. Patient states he is doing much better after several days on levaquin. Still productive cough, but no fevers or SOB. No side effects noted with the medication.    Relevant past medical, surgical, family and social history reviewed and updated as indicated. Interim medical history since our last visit reviewed. Allergies and medications reviewed and updated.  Review of Systems  Constitutional: Negative.   HENT: Positive for congestion.   Eyes: Negative.   Respiratory: Positive for cough and wheezing.   Cardiovascular: Negative.   Gastrointestinal: Negative.   Genitourinary: Negative.   Musculoskeletal: Negative.   Neurological: Negative.   Psychiatric/Behavioral: Negative.     Per HPI unless specifically indicated above     Objective:    BP 132/75   Pulse 92   Temp 98.2 F (36.8 C)   Wt 145 lb (65.8 kg)   SpO2 98%   BMI 25.69 kg/m   Wt Readings from Last 3 Encounters:  07/26/16 145 lb (65.8 kg)  07/22/16 161 lb (73 kg)  04/09/16 142 lb (64.4 kg)    Physical Exam  Constitutional: He appears well-developed and well-nourished.  HENT:  Head: Atraumatic.  Right Ear: External ear normal.  Left Ear: External ear normal.  Nose: Nose normal.  Mouth/Throat: Oropharynx is clear and moist. No oropharyngeal exudate.  Eyes: Conjunctivae are normal. Pupils are equal, round, and reactive to light.  No scleral icterus.  Neck: Normal range of motion. Neck supple.  Cardiovascular: Normal rate.   Murmur heard. Pulmonary/Chest: Effort normal. No respiratory distress. He has wheezes (minmal wheezes throughout).  Musculoskeletal: Normal range of motion.  Lymphadenopathy:    He has no cervical adenopathy.  Skin: Skin is warm and dry.  Psychiatric: He has a normal mood and affect. His behavior is normal.  Nursing note and vitals reviewed.   Results for orders placed or performed during the hospital encounter of 07/22/16  Culture, blood (Routine x 2)  Result Value Ref Range   Specimen Description BLOOD LEFT HAND    Special Requests      BOTTLES DRAWN AEROBIC AND ANAEROBIC 11CCAERO,12CCANA   Culture NO GROWTH 4 DAYS    Report Status PENDING   Culture, blood (Routine x 2)  Result Value Ref Range   Specimen Description BLOOD RIGHT FOREARM    Special Requests      BOTTLES DRAWN AEROBIC AND ANAEROBIC 7CCAERO,11CCANA   Culture NO GROWTH 4 DAYS    Report Status PENDING   Urine culture  Result Value Ref Range   Specimen Description URINE, RANDOM    Special Requests NONE    Culture NO GROWTH [facility]     Report Status 07/24/2016 FINAL   Comprehensive metabolic panel  Result Value Ref Range   Sodium 136  135 - 145 mmol/L   Potassium 3.6 3.5 - 5.1 mmol/L   Chloride 108 101 - 111 mmol/L   CO2 22 22 - 32 mmol/L   Glucose, Bld 125 (H) 65 - 99 mg/dL   BUN 18 6 - 20 mg/dL   Creatinine, Ser 1.610.97 0.61 - 1.24 mg/dL   Calcium 8.6 (L) 8.9 - 10.3 mg/dL   Total Protein 6.7 6.5 - 8.1 g/dL   Albumin 3.7 3.5 - 5.0 g/dL   AST 41 15 - 41 U/L   ALT 18 17 - 63 U/L   Alkaline Phosphatase 27 (L) 38 - 126 U/L   Total Bilirubin 0.4 0.3 - 1.2 mg/dL   GFR calc non Af Amer >60 >60 mL/min   GFR calc Af Amer >60 >60 mL/min   Anion gap 6 5 - 15  Lactic acid, plasma  Result Value Ref Range   Lactic Acid, Venous 1.0 0.5 - 1.9 mmol/L  CBC with Differential  Result Value Ref Range     WBC 7.5 3.8 - 10.6 K/uL   RBC 3.58 (L) 4.40 - 5.90 MIL/uL   Hemoglobin 11.5 (L) 13.0 - 18.0 g/dL   HCT 09.633.2 (L) 04.540.0 - 40.952.0 %   MCV 92.8 80.0 - 100.0 fL   MCH 32.1 26.0 - 34.0 pg   MCHC 34.6 32.0 - 36.0 g/dL   RDW 81.113.7 91.411.5 - 78.214.5 %   Platelets 114 (L) 150 - 440 K/uL   Neutrophils Relative % 68 %   Neutro Abs 5.1 1.4 - 6.5 K/uL   Lymphocytes Relative 19 %   Lymphs Abs 1.4 1.0 - 3.6 K/uL   Monocytes Relative 13 %   Monocytes Absolute 1.0 0.2 - 1.0 K/uL   Eosinophils Relative 0 %   Eosinophils Absolute 0.0 0 - 0.7 K/uL   Basophils Relative 0 %   Basophils Absolute 0.0 0 - 0.1 K/uL  Protime-INR  Result Value Ref Range   Prothrombin Time 15.2 11.4 - 15.2 seconds   INR 1.19   Urinalysis, Complete w Microscopic  Result Value Ref Range   Color, Urine YELLOW (A) YELLOW   APPearance CLEAR (A) CLEAR   Specific Gravity, Urine 1.017 1.005 - 1.030   pH 8.0 5.0 - 8.0   Glucose, UA NEGATIVE NEGATIVE mg/dL   Hgb urine dipstick NEGATIVE NEGATIVE   Bilirubin Urine NEGATIVE NEGATIVE   Ketones, ur NEGATIVE NEGATIVE mg/dL   Protein, ur NEGATIVE NEGATIVE mg/dL   Nitrite NEGATIVE NEGATIVE   Leukocytes, UA NEGATIVE NEGATIVE   RBC / HPF 0-5 0 - 5 RBC/hpf   WBC, UA 0-5 0 - 5 WBC/hpf   Bacteria, UA NONE SEEN NONE SEEN   Squamous Epithelial / LPF 0-5 (A) NONE SEEN   Mucous PRESENT       Assessment & Plan:   Problem List Items Addressed This Visit    None    Visit Diagnoses    Pneumonia of right lower lobe due to infectious organism (HCC)    -  Primary   Continue levaquin. Albuterol inhaler sent for as needed use for wheezing and chest tightness. Follow up in 1-2 weeks for lung recheck.    Relevant Medications   albuterol (PROVENTIL HFA;VENTOLIN HFA) 108 (90 Base) MCG/ACT inhaler       Follow up plan: Return in about 2 weeks (around 08/09/2016) for lung recheck.

## 2016-07-27 LAB — CULTURE, BLOOD (ROUTINE X 2)
CULTURE: NO GROWTH
Culture: NO GROWTH

## 2016-08-09 ENCOUNTER — Encounter: Payer: Self-pay | Admitting: Family Medicine

## 2016-08-09 ENCOUNTER — Ambulatory Visit (INDEPENDENT_AMBULATORY_CARE_PROVIDER_SITE_OTHER): Payer: Medicare Other | Admitting: Family Medicine

## 2016-08-09 DIAGNOSIS — G40009 Localization-related (focal) (partial) idiopathic epilepsy and epileptic syndromes with seizures of localized onset, not intractable, without status epilepticus: Secondary | ICD-10-CM | POA: Diagnosis not present

## 2016-08-09 NOTE — Assessment & Plan Note (Signed)
The current medical regimen is effective;  continue present plan and medications.  

## 2016-08-09 NOTE — Progress Notes (Signed)
BP 124/77   Pulse 81   Temp 98.2 F (36.8 C) (Oral)   Ht 5\' 3"  (1.6 m)   Wt 147 lb (66.7 kg)   SpO2 98%   BMI 26.04 kg/m    Subjective:    Patient ID: Raymond [name], male    DOB: 04-15-1961, 56 y.o.   MRN: [id number]  HPI: Raymond [name] is a 56 y.o. male  Chief Complaint  Patient presents with  . Follow-up    2 week lung check   Patient follow-up pneumonia doing well here with caregiver who provides history. Agents quit coughing no further fevers to medicine without problems and only issue noted was some decreased appetite. Patient's weight today is noted is with his jacket on. Patient's seizures have been stable with no further or exacerbation during the time of his pneumonia. Continues taking his medicines faithfully. Reviewed medications with caregiver.  Relevant past medical, surgical, family and social history reviewed and updated as indicated. Interim medical history since our last visit reviewed. Allergies and medications reviewed and updated.  Review of Systems  Constitutional: Negative.   Respiratory: Negative.   Cardiovascular: Negative.     Per HPI unless specifically indicated above     Objective:    BP 124/77   Pulse 81   Temp 98.2 F (36.8 C) (Oral)   Ht 5\' 3"  (1.6 m)   Wt 147 lb (66.7 kg)   SpO2 98%   BMI 26.04 kg/m   Wt Readings from Last 3 Encounters:  08/09/16 147 lb (66.7 kg)  07/26/16 145 lb (65.8 kg)  07/22/16 161 lb (73 kg)    Physical Exam  Constitutional: He is oriented to person, place, and time. He appears well-developed and well-nourished. No distress.  HENT:  Head: Normocephalic and atraumatic.  Right Ear: Hearing normal.  Left Ear: Hearing normal.  Nose: Nose normal.  Eyes: Conjunctivae and lids are normal. Right eye exhibits no discharge. Left eye exhibits no discharge. No scleral icterus.  Cardiovascular: Normal rate, regular rhythm and normal heart sounds.   Pulmonary/Chest: Effort normal and breath sounds normal.  No respiratory distress.  Musculoskeletal: Normal range of motion.  Neurological: He is alert and oriented to person, place, and time.  Skin: Skin is intact. No rash noted.  Psychiatric: He has a normal mood and affect. His speech is normal and behavior is normal. Cognition and memory are normal.    Results for orders placed or performed during the hospital encounter of 07/22/16  Culture, blood (Routine x 2)  Result Value Ref Range   Specimen Description BLOOD LEFT HAND    Special Requests      BOTTLES DRAWN AEROBIC AND ANAEROBIC 11CCAERO,12CCANA   Culture NO GROWTH 5 DAYS    Report Status 07/27/2016 FINAL   Culture, blood (Routine x 2)  Result Value Ref Range   Specimen Description BLOOD RIGHT FOREARM    Special Requests      BOTTLES DRAWN AEROBIC AND ANAEROBIC 7CCAERO,11CCANA   Culture NO GROWTH 5 DAYS    Report Status 07/27/2016 FINAL   Urine culture  Result Value Ref Range   Specimen Description URINE, RANDOM    Special Requests NONE    Culture NO GROWTH Performed at [facility]     Report Status 07/24/2016 FINAL   Comprehensive metabolic panel  Result Value Ref Range   Sodium 136 135 - 145 mmol/L   Potassium 3.6 3.5 - 5.1 mmol/L   Chloride 108 101 - 111 mmol/L  CO2 22 22 - 32 mmol/L   Glucose, Bld 125 (H) 65 - 99 mg/dL   BUN 18 6 - 20 mg/dL   Creatinine, Ser 1.610.97 0.61 - 1.24 mg/dL   Calcium 8.6 (L) 8.9 - 10.3 mg/dL   Total Protein 6.7 6.5 - 8.1 g/dL   Albumin 3.7 3.5 - 5.0 g/dL   AST 41 15 - 41 U/L   ALT 18 17 - 63 U/L   Alkaline Phosphatase 27 (L) 38 - 126 U/L   Total Bilirubin 0.4 0.3 - 1.2 mg/dL   GFR calc non Af Amer >60 >60 mL/min   GFR calc Af Amer >60 >60 mL/min   Anion gap 6 5 - 15  Lactic acid, plasma  Result Value Ref Range   Lactic Acid, Venous 1.0 0.5 - 1.9 mmol/L  CBC with Differential  Result Value Ref Range   WBC 7.5 3.8 - 10.6 K/uL   RBC 3.58 (L) 4.40 - 5.90 MIL/uL   Hemoglobin 11.5 (L) 13.0 - 18.0 g/dL   HCT 09.633.2 (L) 04.540.0 - 40.952.0  %   MCV 92.8 80.0 - 100.0 fL   MCH 32.1 26.0 - 34.0 pg   MCHC 34.6 32.0 - 36.0 g/dL   RDW 81.113.7 91.411.5 - 78.214.5 %   Platelets 114 (L) 150 - 440 K/uL   Neutrophils Relative % 68 %   Neutro Abs 5.1 1.4 - 6.5 K/uL   Lymphocytes Relative 19 %   Lymphs Abs 1.4 1.0 - 3.6 K/uL   Monocytes Relative 13 %   Monocytes Absolute 1.0 0.2 - 1.0 K/uL   Eosinophils Relative 0 %   Eosinophils Absolute 0.0 0 - 0.7 K/uL   Basophils Relative 0 %   Basophils Absolute 0.0 0 - 0.1 K/uL  Protime-INR  Result Value Ref Range   Prothrombin Time 15.2 11.4 - 15.2 seconds   INR 1.19   Urinalysis, Complete w Microscopic  Result Value Ref Range   Color, Urine YELLOW (A) YELLOW   APPearance CLEAR (A) CLEAR   Specific Gravity, Urine 1.017 1.005 - 1.030   pH 8.0 5.0 - 8.0   Glucose, UA NEGATIVE NEGATIVE mg/dL   Hgb urine dipstick NEGATIVE NEGATIVE   Bilirubin Urine NEGATIVE NEGATIVE   Ketones, ur NEGATIVE NEGATIVE mg/dL   Protein, ur NEGATIVE NEGATIVE mg/dL   Nitrite NEGATIVE NEGATIVE   Leukocytes, UA NEGATIVE NEGATIVE   RBC / HPF 0-5 0 - 5 RBC/hpf   WBC, UA 0-5 0 - 5 WBC/hpf   Bacteria, UA NONE SEEN NONE SEEN   Squamous Epithelial / LPF 0-5 (A) NONE SEEN   Mucous PRESENT       Assessment & Plan:   Problem List Items Addressed This Visit      Nervous and Auditory   Epileptic seizures (HCC)    The current medical regimen is effective;  continue present plan and medications.          Pneumonia resolved with no further sequela  Follow up plan: Return in about 4 weeks (around 09/06/2016).

## 2016-08-29 DIAGNOSIS — F919 Conduct disorder, unspecified: Secondary | ICD-10-CM | POA: Diagnosis not present

## 2016-08-30 ENCOUNTER — Other Ambulatory Visit: Payer: Self-pay | Admitting: Family Medicine

## 2016-09-05 ENCOUNTER — Ambulatory Visit: Payer: Medicare Other | Admitting: Family Medicine

## 2016-09-24 ENCOUNTER — Encounter: Payer: Self-pay | Admitting: Family Medicine

## 2016-09-24 ENCOUNTER — Ambulatory Visit (INDEPENDENT_AMBULATORY_CARE_PROVIDER_SITE_OTHER): Payer: Medicare Other | Admitting: Family Medicine

## 2016-09-24 VITALS — BP 111/70 | HR 77 | Ht 63.0 in | Wt 138.6 lb

## 2016-09-24 DIAGNOSIS — E785 Hyperlipidemia, unspecified: Secondary | ICD-10-CM

## 2016-09-24 NOTE — Progress Notes (Signed)
BP 111/70   Pulse 77   Ht 5\' 3"  (1.6 m)   Wt 138 lb 9.6 oz (62.9 kg)   SpO2 98%   BMI 24.55 kg/m    Subjective:    Patient ID: Raymond [name], male    DOB: 1960/10/13, 56 y.o.   MRN: [id number]  HPI: Raymond [name] is a 56 y.o. male  Chief Complaint  Patient presents with  . Follow-up  Patient here with caregiver doing well with all his medications no complaints nose no issues. Nerves been stable. Breathing stable  Relevant past medical, surgical, family and social history reviewed and updated as indicated. Interim medical history since our last visit reviewed. Allergies and medications reviewed and updated.  Review of Systems  Constitutional: Negative.   Respiratory: Negative.   Cardiovascular: Negative.     Per HPI unless specifically indicated above     Objective:    BP 111/70   Pulse 77   Ht 5\' 3"  (1.6 m)   Wt 138 lb 9.6 oz (62.9 kg)   SpO2 98%   BMI 24.55 kg/m   Wt Readings from Last 3 Encounters:  09/24/16 138 lb 9.6 oz (62.9 kg)  08/09/16 147 lb (66.7 kg)  07/26/16 145 lb (65.8 kg)    Physical Exam  Constitutional: He is oriented to person, place, and time. He appears well-developed and well-nourished.  HENT:  Head: Normocephalic and atraumatic.  Eyes: Conjunctivae and EOM are normal.  Neck: Normal range of motion.  Cardiovascular: Normal rate, regular rhythm and normal heart sounds.   Pulmonary/Chest: Effort normal and breath sounds normal.  Musculoskeletal: Normal range of motion.  Neurological: He is alert and oriented to person, place, and time.  Skin: No erythema.  Psychiatric: He has a normal mood and affect. His behavior is normal. Judgment and thought content normal.    Results for orders placed or performed during the hospital encounter of 07/22/16  Culture, blood (Routine x 2)  Result Value Ref Range   Specimen Description BLOOD LEFT HAND    Special Requests      BOTTLES DRAWN AEROBIC AND ANAEROBIC 11CCAERO,12CCANA   Culture  NO GROWTH 5 DAYS    Report Status 07/27/2016 FINAL   Culture, blood (Routine x 2)  Result Value Ref Range   Specimen Description BLOOD RIGHT FOREARM    Special Requests      BOTTLES DRAWN AEROBIC AND ANAEROBIC 7CCAERO,11CCANA   Culture NO GROWTH 5 DAYS    Report Status 07/27/2016 FINAL   Urine culture  Result Value Ref Range   Specimen Description URINE, RANDOM    Special Requests NONE    Culture NO GROWTH Performed at [facility]     Report Status 07/24/2016 FINAL   Comprehensive metabolic panel  Result Value Ref Range   Sodium 136 135 - 145 mmol/L   Potassium 3.6 3.5 - 5.1 mmol/L   Chloride 108 101 - 111 mmol/L   CO2 22 22 - 32 mmol/L   Glucose, Bld 125 (H) 65 - 99 mg/dL   BUN 18 6 - 20 mg/dL   Creatinine, Ser 7.82 0.61 - 1.24 mg/dL   Calcium 8.6 (L) 8.9 - 10.3 mg/dL   Total Protein 6.7 6.5 - 8.1 g/dL   Albumin 3.7 3.5 - 5.0 g/dL   AST 41 15 - 41 U/L   ALT 18 17 - 63 U/L   Alkaline Phosphatase 27 (L) 38 - 126 U/L   Total Bilirubin 0.4 0.3 - 1.2 mg/dL  GFR calc non Af Amer >60 >60 mL/min   GFR calc Af Amer >60 >60 mL/min   Anion gap 6 5 - 15  Lactic acid, plasma  Result Value Ref Range   Lactic Acid, Venous 1.0 0.5 - 1.9 mmol/L  CBC with Differential  Result Value Ref Range   WBC 7.5 3.8 - 10.6 K/uL   RBC 3.58 (L) 4.40 - 5.90 MIL/uL   Hemoglobin 11.5 (L) 13.0 - 18.0 g/dL   HCT 16.133.2 (L) 09.640.0 - 04.552.0 %   MCV 92.8 80.0 - 100.0 fL   MCH 32.1 26.0 - 34.0 pg   MCHC 34.6 32.0 - 36.0 g/dL   RDW 40.913.7 81.111.5 - 91.414.5 %   Platelets 114 (L) 150 - 440 K/uL   Neutrophils Relative % 68 %   Neutro Abs 5.1 1.4 - 6.5 K/uL   Lymphocytes Relative 19 %   Lymphs Abs 1.4 1.0 - 3.6 K/uL   Monocytes Relative 13 %   Monocytes Absolute 1.0 0.2 - 1.0 K/uL   Eosinophils Relative 0 %   Eosinophils Absolute 0.0 0 - 0.7 K/uL   Basophils Relative 0 %   Basophils Absolute 0.0 0 - 0.1 K/uL  Protime-INR  Result Value Ref Range   Prothrombin Time 15.2 11.4 - 15.2 seconds   INR 1.19     Urinalysis, Complete w Microscopic  Result Value Ref Range   Color, Urine YELLOW (A) YELLOW   APPearance CLEAR (A) CLEAR   Specific Gravity, Urine 1.017 1.005 - 1.030   pH 8.0 5.0 - 8.0   Glucose, UA NEGATIVE NEGATIVE mg/dL   Hgb urine dipstick NEGATIVE NEGATIVE   Bilirubin Urine NEGATIVE NEGATIVE   Ketones, ur NEGATIVE NEGATIVE mg/dL   Protein, ur NEGATIVE NEGATIVE mg/dL   Nitrite NEGATIVE NEGATIVE   Leukocytes, UA NEGATIVE NEGATIVE   RBC / HPF 0-5 0 - 5 RBC/hpf   WBC, UA 0-5 0 - 5 WBC/hpf   Bacteria, UA NONE SEEN NONE SEEN   Squamous Epithelial / LPF 0-5 (A) NONE SEEN   Mucous PRESENT       Assessment & Plan:   Problem List Items Addressed This Visit      Other   Hyperlipidemia - Primary   Relevant Orders   Basic metabolic panel       Follow up plan: Return in about 6 months (around 03/24/2017) for Physical Exam.

## 2016-11-28 DIAGNOSIS — F919 Conduct disorder, unspecified: Secondary | ICD-10-CM | POA: Diagnosis not present

## 2016-12-06 ENCOUNTER — Ambulatory Visit: Payer: Medicare Other | Admitting: Cardiovascular Disease

## 2016-12-12 DIAGNOSIS — Z5181 Encounter for therapeutic drug level monitoring: Secondary | ICD-10-CM | POA: Diagnosis not present

## 2016-12-12 DIAGNOSIS — E559 Vitamin D deficiency, unspecified: Secondary | ICD-10-CM | POA: Diagnosis not present

## 2016-12-12 DIAGNOSIS — R569 Unspecified convulsions: Secondary | ICD-10-CM | POA: Diagnosis not present

## 2016-12-12 DIAGNOSIS — E538 Deficiency of other specified B group vitamins: Secondary | ICD-10-CM | POA: Diagnosis not present

## 2016-12-31 ENCOUNTER — Telehealth: Payer: Self-pay | Admitting: Family Medicine

## 2016-12-31 NOTE — Telephone Encounter (Signed)
Documents dropped off placed in Dr Henriette CombsJohnson's box to be signed.  Raymond [name] would like a call when completed.  [phone]

## 2017-01-01 ENCOUNTER — Other Ambulatory Visit: Payer: Self-pay | Admitting: Family Medicine

## 2017-01-01 NOTE — Telephone Encounter (Signed)
Can wait for Dr.Crissman to return on Wednesday.  Will place in his box.

## 2017-01-02 NOTE — Telephone Encounter (Signed)
Last OV: 09/24/16 Next OV: None on file.   Lab Results  Component Value Date   CHOL 203 (H) 02/23/2016   HDL 22 (L) 02/23/2016   LDLCALC 112 (H) 02/23/2016   TRIG 344 (H) 02/23/2016   CHOLHDL 4.3 07/11/2015   Lab Results  Component Value Date   CREATININE 0.97 07/22/2016   BUN 18 07/22/2016   NA 136 07/22/2016   K 3.6 07/22/2016   CL 108 07/22/2016   CO2 22 07/22/2016

## 2017-01-02 NOTE — Telephone Encounter (Signed)
Forms completed and placed on provider's desk for signature. Will fax back when done.

## 2017-01-02 NOTE — Telephone Encounter (Signed)
Forms completed. [facility]aced [facility] front desk [facility].

## 2017-02-01 ENCOUNTER — Other Ambulatory Visit: Payer: Self-pay | Admitting: Family Medicine

## 2017-02-11 ENCOUNTER — Ambulatory Visit (INDEPENDENT_AMBULATORY_CARE_PROVIDER_SITE_OTHER): Payer: Medicare Other | Admitting: Nurse Practitioner

## 2017-02-11 ENCOUNTER — Ambulatory Visit: Payer: Medicare Other | Admitting: Cardiovascular Disease

## 2017-02-11 ENCOUNTER — Encounter: Payer: Self-pay | Admitting: Nurse Practitioner

## 2017-02-11 VITALS — BP 102/68 | HR 79 | Ht 65.0 in | Wt 137.5 lb

## 2017-02-11 DIAGNOSIS — I340 Nonrheumatic mitral (valve) insufficiency: Secondary | ICD-10-CM

## 2017-02-11 DIAGNOSIS — I341 Nonrheumatic mitral (valve) prolapse: Secondary | ICD-10-CM | POA: Diagnosis not present

## 2017-02-11 NOTE — Progress Notes (Signed)
Office Visit    Patient Name: Raymond [name] Date of Encounter: 02/11/2017  Primary Care Provider:  Steele Sizer, MD Primary Cardiologist:  Judie Petit. Kirke Corin, MD   Chief Complaint    56 y/o ? with a history of severe mental retardation, MVP, and severe MR, who presents for f/u.  Past Medical History    Past Medical History:  Diagnosis Date  . Epileptic seizures (HCC)   . Hyperlipidemia   . Mental retardation   . OCD (obsessive compulsive disorder)   . Severe mitral regurgitation    a. 02/2016 Echo: EF 55-60%, no rwma, MVP of posterior leaflet w/ an anteriorly directed MR - severe. Sev dil LA.   History reviewed. No pertinent surgical history.  Allergies  No Known Allergies  History of Present Illness    56 y/o ? with the above PMH including mental retardation, seizures, and HL.  He was evaluated by Dr. Kirke Corin in 03/2017 in the setting of systolic murmur and echo finding of MVP with severe MR.  At the time, pt was clinically stable and asymptomatic and decision was made to follow him clinically.  Since his last visit, he has continued to do well.  His caregiver is with him today.  The patient is not able to provide much history other than to say that he has not been having any pain.  Per caregiver, he has not complained of any chest pain, dyspnea, presyncope/syncope.  She has not noted any change in exercise tolerance, though notes that other than the 8 hrs he spends @ work (light activities per caregiver), he is fairly sedentary and naps frequently.  She notes that his appetite has improved recently.  His wt is unchanged.  Home Medications    Prior to Admission medications   Medication Sig Start Date End Date Taking? Authorizing Provider  albuterol (PROVENTIL HFA;VENTOLIN HFA) 108 (90 Base) MCG/ACT inhaler Inhale 2 puffs into the lungs every 6 (six) hours as needed for wheezing or shortness of breath. 07/26/16  Yes Particia Nearing, PA-C  ASPIRIN LOW DOSE 81 MG EC tablet  TAKE 1 TABLET BY MOUTH ONCE DAILY. 07/02/16  Yes Crissman, Redge Gainer, MD  carbamazepine (TEGRETOL) 200 MG tablet TAKE 1 TABLET BY MOUTH 3 TIMES DAILY WITH MEALS. 08/30/16  Yes Crissman, Redge Gainer, MD  cholecalciferol (VITAMIN D) 1000 units tablet Take 1,000 Units by mouth daily.   Yes [provider]  Cyanocobalamin (RA VITAMIN B-12 TR) 1000 MCG TBCR Take 1,000 mcg by mouth daily. 03/02/16 03/02/17 Yes [provider]  diphenhydrAMINE (BANOPHEN) 25 MG tablet Take 25 mg by mouth every 6 (six) hours as needed.   Yes [provider]  divalproex (DEPAKOTE) 500 MG DR tablet TAKE 1 TABLET BY MOUTH THREE TIMES DAILY WITH MEALS. 07/02/16  Yes Crissman, Redge Gainer, MD  fenofibrate (TRICOR) 145 MG tablet TAKE 1 TABLET BY MOUTH ONCE DAILY FOR LIPIDS. 01/02/17  Yes Crissman, Redge Gainer, MD  fluticasone (FLONASE) 50 MCG/ACT nasal spray Place 2 sprays into both nostrils daily. Use PRN 02/23/16  Yes Crissman, Redge Gainer, MD  loratadine (CLARITIN) 10 MG tablet TAKE 1 TABLET BY MOUTH ONCE DAILY. 02/01/17  Yes Gabriel Cirri, NP  meclizine (ANTIVERT) 25 MG tablet Take 25 mg by mouth 3 (three) times daily as needed for dizziness.   Yes [provider]  Multiple Vitamin (MULTIVITAMIN WITH MINERALS) TABS tablet Take 1 tablet by mouth daily.   Yes [provider]  risperiDONE (RISPERDAL) 1 MG tablet  TAKE 1 TABLET BY MOUTH TWICE DAILY. 07/02/16  Yes Crissman, Redge GainerMark A, MD  SENEXON-S 8.6-50 MG per tablet TAKE 1 TABLET BY MOUTH ONCE DAILY. 01/12/15  Yes Crissman, Redge GainerMark A, MD    Review of Systems    Pt unable to provide a full review of Ss.  Per caregiver, he has not complained of chest pain, palpitations, dyspnea, orthopnea, n, v, dizziness, syncope, or edema.  All other systems reviewed and are otherwise negative except as noted above.  Physical Exam    VS:  BP 102/68 (BP Location: Left Arm, Patient Position: Sitting, Cuff Size: Normal)   Pulse 79   Ht 5\' 5"  (1.651 m)   Wt 137 lb 8 oz (62.4 kg)   BMI 22.88  kg/m  , BMI Body mass index is 22.88 kg/m. GEN: Well nourished, well developed, in no acute distress.  HEENT: normal.  Neck: Supple, no JVD, carotid bruits, or masses. Cardiac: RRR, 3/6 holosyst murmur heard throughout - loudest @ apex and radiates to left back.  No rubs, or gallops. No clubbing, cyanosis, edema.  Radials/DP/PT 2+ and equal bilaterally.  Respiratory:  Respirations regular and unlabored, clear to auscultation bilaterally. GI: Soft, nontender, nondistended, BS + x 4. MS: no deformity or atrophy. Skin: warm and dry, no rash. Neuro:  Strength and sensation are intact. Psych: Normal affect.  Accessory Clinical Findings    ECG - RSR, PVC, non-specific t changes.  Assessment & Plan    1.  Mitral Valve Prolapse/Severe Mitral Regurgitation:  Pt with known severe MR and MVP noted on echo in 02/2016.  Difficult situation due to his inability to provide adequate history r/t severe mental retardation.  Per caregiver, he has not had any complaints recently and she has not noted a change in exercise tolerance.  I will arrange for a repeat echo to re-eval MR as he is coming up on one year since his last echo.  We will continue to follow clinically.  F/u in six months, ideally with POA present so we can continue to discuss best course going forward.  2.  Disposition:  F/u echo in August.  F/u with Dr. Kirke CorinArida in 6 mos or sooner if necessary.   Nicolasa Duckinghristopher Shauntelle Jamerson, NP 02/11/2017, 2:51 PM

## 2017-02-11 NOTE — Patient Instructions (Signed)
Medication Instructions:  Your physician recommends that you continue on your current medications as directed. Please refer to the Current Medication list given to you today.    Labwork: none  Testing/Procedures: Your physician has requested that you have an echocardiogram in August. Echocardiography is a painless test that uses sound waves to create images of your heart. It provides your doctor with information about the size and shape of your heart and how well your heart's chambers and valves are working. This procedure takes approximately one hour. There are no restrictions for this procedure.    Follow-Up: Your physician wants you to follow-up in: 6 months with Dr. Kirke CorinArida.  You will receive a reminder letter in the mail two months in advance. If you don't receive a letter, please call our office to schedule the follow-up appointment.   Any Other Special Instructions Will Be Listed Below (If Applicable).     If you need a refill on your cardiac medications before your next appointment, please call your pharmacy.

## 2017-02-27 ENCOUNTER — Other Ambulatory Visit: Payer: Self-pay | Admitting: Family Medicine

## 2017-02-27 DIAGNOSIS — F919 Conduct disorder, unspecified: Secondary | ICD-10-CM | POA: Diagnosis not present

## 2017-02-27 MED ORDER — CARBAMAZEPINE 200 MG PO TABS: 200.0000 mg | ORAL_TABLET | Freq: Three times a day (TID) | ORAL | 6 refills | Status: DC

## 2017-03-04 ENCOUNTER — Other Ambulatory Visit: Payer: Self-pay

## 2017-03-04 ENCOUNTER — Ambulatory Visit (INDEPENDENT_AMBULATORY_CARE_PROVIDER_SITE_OTHER): Payer: Medicare Other

## 2017-03-04 DIAGNOSIS — I340 Nonrheumatic mitral (valve) insufficiency: Secondary | ICD-10-CM | POA: Diagnosis not present

## 2017-03-04 LAB — ECHOCARDIOGRAM COMPLETE
E/e' ratio: 5.12
EWDT: 158 ms
FS: 37 % (ref 28–44)
IV/PV OW: 0.7
LA ID, A-P, ES: 44 mm
LA diam end sys: 44 mm
LA diam index: 2.6 cm/m2
LA vol index: 101.8 mL/m2
LA vol: 172 mL
LAVOLA4C: 134 mL
LV E/e'average: 5.12
LV SIMPSON'S DISK: 58
LV dias vol index: 96 mL/m2
LV dias vol: 163 mL — AB (ref 62–150)
LV e' LATERAL: 20.3 cm/s
LVEEMED: 5.12
LVOT VTI: 11.2 cm
LVOT area: 3.46 cm2
LVOTD: 21 mm
LVOTSV: 39 mL
LVSYSVOL: 69 mL — AB (ref 21–61)
LVSYSVOLIN: 41 mL/m2
MRPISAEROA: 0.17 cm2
MV Dec: 158
MV Peak grad: 4 mmHg
MV VTI: 136 cm
MV pk A vel: 48.5 m/s
MVPKEVEL: 104 m/s
PW: 10 mm — AB (ref 0.6–1.1)
RV LATERAL S' VELOCITY: 11.6 cm/s
RV TAPSE: 20.7 mm
RV sys press: 32 mmHg
Reg peak vel: 268 cm/s
Stroke v: 94 ml
TDI e' lateral: 20.3
TDI e' medial: 9.79
TR max vel: 268 cm/s

## 2017-04-02 ENCOUNTER — Other Ambulatory Visit: Payer: Self-pay | Admitting: Family Medicine

## 2017-04-25 ENCOUNTER — Telehealth: Payer: Self-pay | Admitting: Cardiovascular Disease

## 2017-04-25 NOTE — Telephone Encounter (Signed)
Received records request Disabiltiy Determination SErvices, forwarded to [facility] for processing.

## 2017-05-13 DIAGNOSIS — F79 Unspecified intellectual disabilities: Secondary | ICD-10-CM | POA: Diagnosis not present

## 2017-07-01 ENCOUNTER — Encounter: Payer: Self-pay | Admitting: Nurse Practitioner

## 2017-07-02 ENCOUNTER — Ambulatory Visit: Payer: Medicare Other

## 2017-07-03 DIAGNOSIS — F919 Conduct disorder, unspecified: Secondary | ICD-10-CM | POA: Diagnosis not present

## 2017-07-05 ENCOUNTER — Ambulatory Visit (INDEPENDENT_AMBULATORY_CARE_PROVIDER_SITE_OTHER): Payer: Medicare Other

## 2017-07-05 DIAGNOSIS — Z23 Encounter for immunization: Secondary | ICD-10-CM

## 2017-07-19 DIAGNOSIS — R569 Unspecified convulsions: Secondary | ICD-10-CM | POA: Diagnosis not present

## 2017-07-19 DIAGNOSIS — Z79899 Other long term (current) drug therapy: Secondary | ICD-10-CM | POA: Diagnosis not present

## 2017-07-19 DIAGNOSIS — E559 Vitamin D deficiency, unspecified: Secondary | ICD-10-CM | POA: Diagnosis not present

## 2017-09-17 ENCOUNTER — Ambulatory Visit: Payer: Medicare Other | Admitting: Nurse Practitioner

## 2017-09-17 ENCOUNTER — Encounter: Payer: Self-pay | Admitting: Physician Assistant

## 2017-09-17 NOTE — Progress Notes (Signed)
Cardiology Office Note Date:  09/19/2017  Patient ID:  Raymond [name], DOB [birth year], MRN [id number] PCP:  Steele Sizerrissman, Mark A, MD  Cardiologist:  Dr. Kirke CorinArida, MD    Chief Complaint: Follow up  History of Present Illness: Raymond [name] is a 57 y.o. male with history of severe mental retardation, MVP, severe mitral regurgitation, HLD, seizure disorder, and OCD who presents for follow up of his mitral valve prolapse and severe mitral regurgitation.   He was evaluated by Dr. Kirke CorinArida in 03/2017 in the setting of systolic murmur and echo finding of MVP with severe mitral regurgitation. At the time, patient was clinically stable and asymptomatic and decision was made to follow him clinically. He was most recently seen in 01/2017 for follow up and was doing well at that time, per his carer giver. Repeat echo in 02/2017 showed an EF of 55-60%, normal wall motion, normal LV diastolic function parameters, severe mitral prolapse involving the anterior leaflet and posterior leaflet with severe regurgitation directed anteriorly, severely dilated left atrium, unable to estimate the PASP.   He is accompanied by his caregiver today who provides most of the history.  Patient does report she is feeling well and has not had any chest pain or shortness of breath.  Caregiver indicates there has been no dizziness, presyncope, or syncope.  Caregiver feels like he is doing well.  Blood pressure has remained stable.  There have been no changes and has exercise tolerance.  He remains fairly sedentary and continues to nap frequently though this is unchanged from his baseline.  Weight is stable.   Past Medical History:  Diagnosis Date  . Epilepsy (HCC)   . Epileptic seizures (HCC)   . Hyperlipidemia   . Mental retardation   . OCD (obsessive compulsive disorder)    water drinking  . OCD (obsessive compulsive disorder)   . Severe mitral regurgitation    a. 02/2016 Echo: EF 55-60%, no rwma, MVP of posterior leaflet w/ an  anteriorly directed MR - severe. Sev dil LA; b. TTE 8/18: EF of 55-60%, normal wall motion, normal LV diastolic function parameters, severe mitral prolapse involving the anterior leaflet and posterior leaflet with severe regurgitation directed anteriorly, severely dilated left atrium, unable to estimate the PASP  . Systolic murmur     History reviewed. No pertinent surgical history.  Current Meds  Medication Sig  . albuterol (PROVENTIL HFA;VENTOLIN HFA) 108 (90 Base) MCG/ACT inhaler Inhale 2 puffs into the lungs every 6 (six) hours as needed for wheezing or shortness of breath.  Marland Kitchen. aspirin EC 81 MG tablet Take 81 mg by mouth daily.  . carbamazepine (TEGRETOL) 200 MG tablet Take 1 tablet (200 mg total) by mouth 3 (three) times daily.  . cholecalciferol (VITAMIN D) 1000 units tablet Take 1,000 Units by mouth daily.  . diphenhydrAMINE (BANOPHEN) 25 MG tablet Take 25 mg by mouth every 6 (six) hours as needed.  . divalproex (DEPAKOTE) 500 MG DR tablet TAKE 1 TABLET BY MOUTH THREE TIMES DAILY WITH MEALS.  . fenofibrate (TRICOR) 145 MG tablet TAKE 1 TABLET BY MOUTH ONCE DAILY FOR LIPIDS.  . fluticasone (FLONASE) 50 MCG/ACT nasal spray Place 2 sprays into both nostrils daily. Use PRN  . loratadine (CLARITIN) 10 MG tablet TAKE 1 TABLET BY MOUTH ONCE DAILY.  . meclizine (ANTIVERT) 25 MG tablet Take 25 mg by mouth 3 (three) times daily as needed for dizziness.  . mirtazapine (REMERON) 15 MG tablet Take 15 mg by mouth at bedtime.  .Marland Kitchen  Multiple Vitamin (MULTIVITAMIN WITH MINERALS) TABS tablet Take 1 tablet by mouth daily.  . risperiDONE (RISPERDAL) 1 MG tablet TAKE 1 TABLET BY MOUTH TWICE DAILY.  . SENEXON-S 8.6-50 MG per tablet TAKE 1 TABLET BY MOUTH ONCE DAILY.  . simvastatin (ZOCOR) 20 MG tablet Take 20 mg by mouth daily.    Allergies:   Patient has no known allergies.   Social History:  The patient  reports that  has never smoked. he has never used smokeless tobacco. He reports that he does not drink  alcohol or use drugs.   Family History:  The patient's Family history is unknown by patient.  ROS:   Review of Systems  Unable to perform ROS: Mental acuity  Per caregiver there has been no dizziness, presyncope, syncope, shortness of breath, or chest pain.  PHYSICAL EXAM:  VS:  BP 108/68 (BP Location: Right Arm, Patient Position: Sitting, Cuff Size: Normal)   Pulse 74   Ht 5\' 8"  (1.727 m)   Wt 144 lb (65.3 kg)   BMI 21.90 kg/m  BMI: Body mass index is 21.9 kg/m.  Physical Exam  Constitutional: He is oriented to person, place, and time. He appears well-developed and well-nourished.  HENT:  Head: Normocephalic and atraumatic.  Eyes: Right eye exhibits no discharge. Left eye exhibits no discharge.  Neck: Normal range of motion. No JVD present.  Cardiovascular: Normal rate, regular rhythm, S1 normal and S2 normal. Exam reveals no distant heart sounds, no friction rub, no midsystolic click and no opening snap.  Murmur heard. High-pitched blowing holosystolic murmur is present with a grade of 4/6 at the apex. Pulses:      Posterior tibial pulses are 2+ on the right side, and 2+ on the left side.  Pulmonary/Chest: Effort normal and breath sounds normal. No respiratory distress. He has no decreased breath sounds. He has no wheezes. He has no rales. He exhibits no tenderness.  Abdominal: Soft. He exhibits no distension. There is no tenderness.  Musculoskeletal: He exhibits no edema.  Neurological: He is alert and oriented to person, place, and time.  Skin: Skin is warm and dry. No cyanosis. Nails show no clubbing.  Psychiatric: He has a normal mood and affect. His speech is normal and behavior is normal. Judgment and thought content normal.     EKG:  Was ordered and interpreted by me today. Shows NSR, 74 bpm, LVH, peaked T waves consistent with prior studies  Recent Labs: No results found for requested labs within last 8760 hours.  No results found for requested labs within last  8760 hours.   CrCl cannot be calculated (Patient's most recent lab result is older than the maximum 21 days allowed.).   Wt Readings from Last 3 Encounters:  09/19/17 144 lb (65.3 kg)  02/11/17 137 lb 8 oz (62.4 kg)  09/24/16 138 lb 9.6 oz (62.9 kg)     Other studies reviewed: Additional studies/records reviewed today include: summarized above  ASSESSMENT AND PLAN:  1. Mitral valve prolapse/severe mitral regurgitation: Clinically, appears stable.  He has known severe mitral regurgitation and mitral valve prolapse dating back to echo on 02/2016.  Overall, this is a difficult situation due to his inability to provide details of his history secondary to severe mental retardation.  History and symptoms are obtained from the caregiver who indicates he has not had any complaints recently and continues to have a stable exercise tolerance.  Check echocardiogram to evaluate his mitral regurgitation as he is 6 months out from  his last.  For now, we will continue to follow clinically.  It would be beneficial to have his POA present at his next follow-up visit to discuss treatment course moving forward.  This was discussed with our staff and the patient's caregiver.    2. Mental retardation: Per PCP.  Disposition: F/u with Dr. Kirke Corin in 6 months.  Current medicines are reviewed at length with the patient today.  The patient did not have any concerns regarding medicines.  Signed, Eula Listen, PA-C 09/19/2017 10:33 AM     [facility] -  [address] [place], [place] [address] [phone]

## 2017-09-19 ENCOUNTER — Encounter: Payer: Self-pay | Admitting: Physician Assistant

## 2017-09-19 ENCOUNTER — Ambulatory Visit (INDEPENDENT_AMBULATORY_CARE_PROVIDER_SITE_OTHER): Payer: Medicare Other | Admitting: Physician Assistant

## 2017-09-19 VITALS — BP 108/68 | HR 74 | Ht 68.0 in | Wt 144.0 lb

## 2017-09-19 DIAGNOSIS — I341 Nonrheumatic mitral (valve) prolapse: Secondary | ICD-10-CM | POA: Diagnosis not present

## 2017-09-19 DIAGNOSIS — F79 Unspecified intellectual disabilities: Secondary | ICD-10-CM | POA: Diagnosis not present

## 2017-09-19 DIAGNOSIS — I340 Nonrheumatic mitral (valve) insufficiency: Secondary | ICD-10-CM | POA: Diagnosis not present

## 2017-09-19 NOTE — Patient Instructions (Signed)
Medication Instructions:  Your physician recommends that you continue on your current medications as directed. Please refer to the Current Medication list given to you today.   Labwork: NONE  Testing/Procedures: Your physician has requested that you have an echocardiogram. Echocardiography is a painless test that uses sound waves to create images of your heart. It provides your doctor with information about the size and shape of your heart and how well your heart's chambers and valves are working. This procedure takes approximately one hour. There are no restrictions for this procedure.    Follow-Up: Your physician wants you to follow-up in: 6 MONTHS WITH DR ARIDA.  You will receive a reminder letter in the mail two months in advance. If you don't receive a letter, please call our office to schedule the follow-up appointment.  If you need a refill on your cardiac medications before your next appointment, please call your pharmacy.   Echocardiogram An echocardiogram, or echocardiography, uses sound waves (ultrasound) to produce an image of your heart. The echocardiogram is simple, painless, obtained within a short period of time, and offers valuable information to your health care provider. The images from an echocardiogram can provide information such as:  Evidence of coronary artery disease (CAD).  Heart size.  Heart muscle function.  Heart valve function.  Aneurysm detection.  Evidence of a past heart attack.  Fluid buildup around the heart.  Heart muscle thickening.  Assess heart valve function.  Tell a health care provider about:  Any allergies you have.  All medicines you are taking, including vitamins, herbs, eye drops, creams, and over-the-counter medicines.  Any problems you or family members have had with anesthetic medicines.  Any blood disorders you have.  Any surgeries you have had.  Any medical conditions you have.  Whether you are pregnant or may be  pregnant. What happens before the procedure? No special preparation is needed. Eat and drink normally. What happens during the procedure?  In order to produce an image of your heart, gel will be applied to your chest and a wand-like tool (transducer) will be moved over your chest. The gel will help transmit the sound waves from the transducer. The sound waves will harmlessly bounce off your heart to allow the heart images to be captured in real-time motion. These images will then be recorded.  You may need an IV to receive a medicine that improves the quality of the pictures. What happens after the procedure? You may return to your normal schedule including diet, activities, and medicines, unless your health care provider tells you otherwise. This information is not intended to replace advice given to you by your health care provider. Make sure you discuss any questions you have with your health care provider. Document Released: 07/13/2000 Document Revised: 03/03/2016 Document Reviewed: 03/23/2013 Elsevier Interactive Patient Education  2017 [employer]

## 2017-10-08 ENCOUNTER — Other Ambulatory Visit: Payer: Medicare Other

## 2017-10-22 DIAGNOSIS — E559 Vitamin D deficiency, unspecified: Secondary | ICD-10-CM | POA: Diagnosis not present

## 2017-10-29 ENCOUNTER — Other Ambulatory Visit: Payer: Self-pay

## 2017-10-29 ENCOUNTER — Ambulatory Visit (INDEPENDENT_AMBULATORY_CARE_PROVIDER_SITE_OTHER): Payer: Medicare Other

## 2017-10-29 DIAGNOSIS — I340 Nonrheumatic mitral (valve) insufficiency: Secondary | ICD-10-CM

## 2017-10-30 DIAGNOSIS — F919 Conduct disorder, unspecified: Secondary | ICD-10-CM | POA: Diagnosis not present

## 2017-11-12 ENCOUNTER — Ambulatory Visit (INDEPENDENT_AMBULATORY_CARE_PROVIDER_SITE_OTHER): Payer: Medicare Other | Admitting: Cardiovascular Disease

## 2017-11-12 ENCOUNTER — Encounter: Payer: Self-pay | Admitting: Cardiovascular Disease

## 2017-11-12 VITALS — BP 114/70 | HR 75 | Ht 68.0 in | Wt 143.8 lb

## 2017-11-12 DIAGNOSIS — R011 Cardiac murmur, unspecified: Secondary | ICD-10-CM | POA: Diagnosis not present

## 2017-11-12 DIAGNOSIS — I340 Nonrheumatic mitral (valve) insufficiency: Secondary | ICD-10-CM | POA: Diagnosis not present

## 2017-11-12 NOTE — Patient Instructions (Signed)
Medication Instructions:  Your physician recommends that you continue on your current medications as directed. Please refer to the Current Medication list given to you today.   Labwork: none  Testing/Procedures: none  Follow-Up: Your physician recommends that you schedule a follow-up appointment in: 4 months with Dr. Kirke CorinArida.   Any Other Special Instructions Will Be Listed Below (If Applicable). Please bring Power of Attorney paperwork at the next office visit.      If you need a refill on your cardiac medications before your next appointment, please call your pharmacy.

## 2017-11-12 NOTE — Progress Notes (Signed)
Cardiology Office Note   Date:  11/12/2017   ID:  Raymond RobCharles T Skiver, DOB 1961/05/03, MRN [id number]  PCP:  Steele Sizerrissman, Mark A, MD  Cardiologist:   Lorine BearsMuhammad Natavia Sublette, MD   Chief Complaint  Patient presents with  . Other    Follow up from echo. Patient denies chest pain and SOB. Meds reviewed verbally with patient.       History of Present Illness: Raymond [name] is a 57 y.o. male who is here for a follow up visit regarding recently diagnosed severe mitral regurgitation due to MVP.  The patient has known history of severe mental retardation, seizure disorder and hyperlipidemia. He lives in an adult care home.  He was seen recently for a cardiac murmur. The patient denies any chest pain, shortness of breath or palpitations but history is limited by mental retardation.  Most recent echocardiogram earlier this month showed normal LV systolic function with severely dilated left ventricle, mild aortic stenosis, severe mitral valve prolapse with severe mitral regurgitation and severely dilated left atrium.  Past Medical History:  Diagnosis Date  . Epilepsy (HCC)   . Epileptic seizures (HCC)   . Hyperlipidemia   . Mental retardation   . OCD (obsessive compulsive disorder)    water drinking  . OCD (obsessive compulsive disorder)   . Severe mitral regurgitation    a. 02/2016 Echo: EF 55-60%, no rwma, MVP of posterior leaflet w/ an anteriorly directed MR - severe. Sev dil LA; b. TTE 8/18: EF of 55-60%, normal wall motion, normal LV diastolic function parameters, severe mitral prolapse involving the anterior leaflet and posterior leaflet with severe regurgitation directed anteriorly, severely dilated left atrium, unable to estimate the PASP  . Systolic murmur     History reviewed. No pertinent surgical history.   Current Outpatient Medications  Medication Sig Dispense Refill  . albuterol (PROVENTIL HFA;VENTOLIN HFA) 108 (90 Base) MCG/ACT inhaler Inhale 2 puffs into the lungs every 6  (six) hours as needed for wheezing or shortness of breath. 1 Inhaler 0  . aspirin EC 81 MG tablet Take 81 mg by mouth daily.    . carbamazepine (TEGRETOL) 200 MG tablet Take 1 tablet (200 mg total) by mouth 3 (three) times daily. 90 tablet 6  . cholecalciferol (VITAMIN D) 1000 units tablet Take 1,000 Units by mouth daily.    . Cyanocobalamin (VITAMIN B 12 PO) Take by mouth.    . diphenhydrAMINE (BANOPHEN) 25 MG tablet Take 25 mg by mouth every 6 (six) hours as needed.    . divalproex (DEPAKOTE) 500 MG DR tablet TAKE 1 TABLET BY MOUTH THREE TIMES DAILY WITH MEALS. 90 tablet 12  . fenofibrate (TRICOR) 145 MG tablet TAKE 1 TABLET BY MOUTH ONCE DAILY FOR LIPIDS. 30 tablet 3  . fluticasone (FLONASE) 50 MCG/ACT nasal spray Place 2 sprays into both nostrils daily. Use PRN 3 g 12  . loratadine (CLARITIN) 10 MG tablet TAKE 1 TABLET BY MOUTH ONCE DAILY. 30 tablet 0  . meclizine (ANTIVERT) 25 MG tablet Take 25 mg by mouth 3 (three) times daily as needed for dizziness.    . Multiple Vitamin (MULTIVITAMIN WITH MINERALS) TABS tablet Take 1 tablet by mouth daily.    . risperiDONE (RISPERDAL) 2 MG tablet     . Vitamin D, Ergocalciferol, (DRISDOL) 50000 units CAPS capsule Take by mouth.     No current facility-administered medications for this visit.     Allergies:   Patient has no known allergies.    Social  History:  The patient  reports that he has never smoked. He has never used smokeless tobacco. He reports that he does not drink alcohol or use drugs.   Family History:  The patient's family history is not known as his biologic family are not known.   ROS:  Please see the history of present illness.   Otherwise, review of systems are positive for none.   All other systems are reviewed and negative.    PHYSICAL EXAM: VS:  BP 114/70 (BP Location: Right Arm, Patient Position: Sitting, Cuff Size: Normal)   Pulse 75   Ht 5\' 8"  (1.727 m)   Wt 143 lb 12 oz (65.2 kg)   BMI 21.86 kg/m  , BMI Body mass  index is 21.86 kg/m. GEN: Well nourished, well developed, in no acute distress  HEENT: normal  Neck: no JVD, carotid bruits, or masses Cardiac: RRR; no  rubs, or gallops,no edema . There is a harsh 3/6 crescendo decrescendo systolic murmur in the left sternal border and the base of the heart. There is also 3/6 holosystolic murmur at the apex radiating to the axilla Respiratory:  clear to auscultation bilaterally, normal work of breathing GI: soft, nontender, nondistended, + BS MS: no deformity or atrophy  Skin: warm and dry, no rash Neuro:  Strength and sensation are intact Psych: euthymic mood, full affect   EKG:  EKG is not ordered today.    Recent Labs: No results found for requested labs within last 8760 hours.    Lipid Panel    Component Value Date/Time   CHOL 203 (H) 02/23/2016 1410   CHOL 188 01/12/2015 1017   TRIG 344 (H) 02/23/2016 1410   TRIG 147 01/12/2015 1017   HDL 22 (L) 02/23/2016 1410   CHOLHDL 4.3 07/11/2015 0906   VLDL 29 01/12/2015 1017   LDLCALC 112 (H) 02/23/2016 1410      Wt Readings from Last 3 Encounters:  11/12/17 143 lb 12 oz (65.2 kg)  09/19/17 144 lb (65.3 kg)  02/11/17 137 lb 8 oz (62.4 kg)       ASSESSMENT AND PLAN:  1.   Severe mitral regurgitation due to mitral valve prolapse: This is somewhat a difficult situation given the patient's severe mental retardation. It's difficult to obtain an accurate history but he does not usually complain of any symptoms.  Given severely dilated left atrium, there is an indication for mitral valve repair even with the absence of symptoms.  We need to discuss this with power of attorney.  However, the patient recently lost his power of attorney which is assigned by the state and needs to have another one.  This might take a few months and I will have the patient follow-up in 4 months to rediscuss hopefully with the presence of power of attorney.  The patient is not able to provide any informed  consent.  2. Severe mental retardation.   3. Seizure disorder: Controlled with medications.  Disposition:   FU with me in 4 months   Signed,  Lorine Bears, MD  11/12/2017 4:52 PM    [facility]

## 2017-12-04 ENCOUNTER — Encounter: Payer: Medicare Other | Admitting: Family Medicine

## 2017-12-05 ENCOUNTER — Ambulatory Visit: Payer: Medicare Other | Admitting: Cardiovascular Disease

## 2017-12-19 ENCOUNTER — Encounter: Payer: Self-pay | Admitting: Family Medicine

## 2017-12-19 ENCOUNTER — Ambulatory Visit (INDEPENDENT_AMBULATORY_CARE_PROVIDER_SITE_OTHER): Payer: Medicare Other | Admitting: Family Medicine

## 2017-12-19 VITALS — BP 122/71 | HR 91 | Temp 98.5°F | Ht 66.13 in | Wt 140.8 lb

## 2017-12-19 DIAGNOSIS — E782 Mixed hyperlipidemia: Secondary | ICD-10-CM

## 2017-12-19 DIAGNOSIS — G40909 Epilepsy, unspecified, not intractable, without status epilepticus: Secondary | ICD-10-CM | POA: Diagnosis not present

## 2017-12-19 DIAGNOSIS — F79 Unspecified intellectual disabilities: Secondary | ICD-10-CM

## 2017-12-19 DIAGNOSIS — Z114 Encounter for screening for human immunodeficiency virus [HIV]: Secondary | ICD-10-CM | POA: Diagnosis not present

## 2017-12-19 DIAGNOSIS — Z0000 Encounter for general adult medical examination without abnormal findings: Secondary | ICD-10-CM

## 2017-12-19 DIAGNOSIS — G40009 Localization-related (focal) (partial) idiopathic epilepsy and epileptic syndromes with seizures of localized onset, not intractable, without status epilepticus: Secondary | ICD-10-CM | POA: Diagnosis not present

## 2017-12-19 DIAGNOSIS — R3911 Hesitancy of micturition: Secondary | ICD-10-CM | POA: Diagnosis not present

## 2017-12-19 DIAGNOSIS — Z79899 Other long term (current) drug therapy: Secondary | ICD-10-CM

## 2017-12-19 LAB — UA/M W/RFLX CULTURE, ROUTINE
BILIRUBIN UA: NEGATIVE
Glucose, UA: NEGATIVE
Leukocytes, UA: NEGATIVE
NITRITE UA: NEGATIVE
PH UA: 6 (ref 5.0–7.5)
Protein, UA: NEGATIVE
RBC, UA: NEGATIVE
Specific Gravity, UA: 1.015 (ref 1.005–1.030)
Urobilinogen, Ur: 0.2 mg/dL (ref 0.2–1.0)

## 2017-12-19 NOTE — Progress Notes (Signed)
BP 122/71 (BP Location: Right Arm, Patient Position: Sitting, Cuff Size: Normal)   Pulse 91   Temp 98.5 F (36.9 C) (Oral)   Ht 5' 6.13" (1.68 m)   Wt 140 lb 12.8 oz (63.9 kg)   SpO2 98%   BMI 22.64 kg/m    Subjective:    Patient ID: Raymond [name], male    DOB: [birth year], 57 y.o.   MRN: [id number]  HPI: Raymond [name] is a 57 y.o. male presenting on 12/19/2017 for comprehensive medical examination. Current medical complaints include:  HYPERLIPIDEMIA Hyperlipidemia status: stable Satisfied with current treatment?  yes Side effects:  no Medication compliance: excellent compliance Past cholesterol meds: fenofibrate Supplements: none Aspirin:  no The 10-year ASCVD risk score Denman George DC Jr., et al., 2013) is: 6.7%   Values used to calculate the score:     Age: 8 years     Sex: Male     Is Non-Hispanic African American: No     Diabetic: No     Tobacco smoker: No     Systolic Blood Pressure: 122 mmHg     Is BP treated: No     HDL Cholesterol: 35 mg/dL     Total Cholesterol: 191 mg/dL Chest pain:  no Coronary artery disease:  no  Epilepsy: No seizures. Doing well. Continues to follow with neurology  He currently lives with: In Group Home [facility] Problems from his last visit: no  Functional Status Survey: Is the patient deaf or have difficulty hearing?: No Does the patient have difficulty seeing, even when wearing glasses/contacts?: No Does the patient have difficulty concentrating, remembering, or making decisions?: Yes Does the patient have difficulty walking or climbing stairs?: No Does the patient have difficulty dressing or bathing?: No Does the patient have difficulty doing errands alone such as visiting a doctor's office or shopping?: No  FALL RISK: Fall Risk  12/19/2017 09/24/2016  Falls in the past year? Yes No  Number falls in past yr: 2 or more -  Injury with Fall? No -    Depression Screen Depression screen [facility] 2/9 12/19/2017  Decreased Interest 1    Down, Depressed, Hopeless 1  PHQ - 2 Score 2  Altered sleeping 2  Tired, decreased energy 2  Change in appetite 0  Feeling bad or failure about yourself  0  Trouble concentrating 1  Moving slowly or fidgety/restless 2  Suicidal thoughts 0  PHQ-9 Score 9    Advanced Directives <no information>  Past Medical History:  Past Medical History:  Diagnosis Date  . Epilepsy (HCC)   . Epileptic seizures (HCC)   . Hyperlipidemia   . Mental retardation   . OCD (obsessive compulsive disorder)    water drinking  . OCD (obsessive compulsive disorder)   . Severe mitral regurgitation    a. 02/2016 Echo: EF 55-60%, no rwma, MVP of posterior leaflet w/ an anteriorly directed MR - severe. Sev dil LA; b. TTE 8/18: EF of 55-60%, normal wall motion, normal LV diastolic function parameters, severe mitral prolapse involving the anterior leaflet and posterior leaflet with severe regurgitation directed anteriorly, severely dilated left atrium, unable to estimate the PASP  . Systolic murmur     Surgical History:  History reviewed. No pertinent surgical history.  Medications:  Current Outpatient Medications on File Prior to Visit  Medication Sig  . albuterol (PROVENTIL HFA;VENTOLIN HFA) 108 (90 Base) MCG/ACT inhaler Inhale 2 puffs into the lungs every 6 (six) hours as needed for wheezing or shortness of  breath.  Marland Kitchen aspirin EC 81 MG tablet Take 81 mg by mouth daily.  . carbamazepine (TEGRETOL) 200 MG tablet Take 1 tablet (200 mg total) by mouth 3 (three) times daily.  . cholecalciferol (VITAMIN D) 1000 units tablet Take 1,000 Units by mouth daily.  . Cyanocobalamin (VITAMIN B 12 PO) Take by mouth.  . divalproex (DEPAKOTE) 500 MG DR tablet TAKE 1 TABLET BY MOUTH THREE TIMES DAILY WITH MEALS.  . fenofibrate (TRICOR) 145 MG tablet TAKE 1 TABLET BY MOUTH ONCE DAILY FOR LIPIDS.  . fluticasone (FLONASE) 50 MCG/ACT nasal spray Place 2 sprays into both nostrils daily. Use PRN  . loratadine (CLARITIN) 10 MG  tablet TAKE 1 TABLET BY MOUTH ONCE DAILY.  . meclizine (ANTIVERT) 25 MG tablet Take 25 mg by mouth 3 (three) times daily as needed for dizziness.  . Multiple Vitamin (MULTIVITAMIN WITH MINERALS) TABS tablet Take 1 tablet by mouth daily.  . risperiDONE (RISPERDAL) 1 MG tablet   . risperiDONE (RISPERDAL) 2 MG tablet    No current facility-administered medications on file prior to visit.     Allergies:  No Known Allergies  Social History:  Social History   Socioeconomic History  . Marital status: Single    Spouse name: Not on file  . Number of children: Not on file  . Years of education: Not on file  . Highest education level: Not on file  Occupational History  . Not on file  Social Needs  . Financial resource strain: Not on file  . Food insecurity:    Worry: Not on file    Inability: Not on file  . Transportation needs:    Medical: Not on file    Non-medical: Not on file  Tobacco Use  . Smoking status: Never Smoker  . Smokeless tobacco: Never Used  Substance and Sexual Activity  . Alcohol use: No    Alcohol/week: 0.0 oz  . Drug use: No  . Sexual activity: Never  Lifestyle  . Physical activity:    Days per week: Not on file    Minutes per session: Not on file  . Stress: Not on file  Relationships  . Social connections:    Talks on phone: Not on file    Gets together: Not on file    Attends religious service: Not on file    Active member of club or organization: Not on file    Attends meetings of clubs or organizations: Not on file    Relationship status: Not on file  . Intimate partner violence:    Fear of current or ex partner: Not on file    Emotionally abused: Not on file    Physically abused: Not on file    Forced sexual activity: Not on file  Other Topics Concern  . Not on file  Social History Narrative   ** Merged History Encounter **       Social History   Tobacco Use  Smoking Status Never Smoker  Smokeless Tobacco Never Used   Social History     Substance and Sexual Activity  Alcohol Use No  . Alcohol/week: 0.0 oz    Family History:  Family History  Family history unknown: Yes    Past medical history, surgical history, medications, allergies, family history and social history reviewed with patient today and changes made to appropriate areas of the chart.   Review of Systems  Constitutional: Positive for malaise/fatigue. Negative for chills, diaphoresis, fever and weight loss.  HENT: Negative.  Eyes: Negative.   Respiratory: Negative.   Cardiovascular: Positive for leg swelling. Negative for chest pain, palpitations, orthopnea, claudication and PND.  Gastrointestinal: Negative.   Genitourinary: Negative.   Musculoskeletal: Negative.   Skin: Negative.   Neurological: Negative.   Endo/Heme/Allergies: Negative.   Psychiatric/Behavioral: Negative.     All other ROS negative except what is listed above and in the HPI.      Objective:    BP 122/71 (BP Location: Right Arm, Patient Position: Sitting, Cuff Size: Normal)   Pulse 91   Temp 98.5 F (36.9 C) (Oral)   Ht 5' 6.13" (1.68 m)   Wt 140 lb 12.8 oz (63.9 kg)   SpO2 98%   BMI 22.64 kg/m   Wt Readings from Last 3 Encounters:  12/19/17 140 lb 12.8 oz (63.9 kg)  11/12/17 143 lb 12 oz (65.2 kg)  09/19/17 144 lb (65.3 kg)    Physical Exam  Constitutional: He is oriented to person, place, and time. He appears well-developed and well-nourished. No distress.  HENT:  Head: Normocephalic and atraumatic.  Right Ear: Hearing, tympanic membrane, external ear and ear canal normal.  Left Ear: Hearing, tympanic membrane, external ear and ear canal normal.  Nose: Nose normal.  Mouth/Throat: Oropharynx is clear and moist. No oropharyngeal exudate.  Eyes: Pupils are equal, round, and reactive to light. Conjunctivae, EOM and lids are normal. Right eye exhibits no discharge. Left eye exhibits no discharge. No scleral icterus.  Neck: Normal range of motion. Neck supple. No  JVD present. No tracheal deviation present. No thyromegaly present.  Cardiovascular: Normal rate, regular rhythm and intact distal pulses. Exam reveals no gallop and no friction rub.  Murmur heard.  Systolic murmur is present. Pulmonary/Chest: Effort normal and breath sounds normal. No stridor. No respiratory distress. He has no wheezes. He has no rales. He exhibits no tenderness.  Abdominal: Soft. Bowel sounds are normal. He exhibits no distension and no mass. There is no tenderness. There is no rebound and no guarding. No hernia.  Musculoskeletal: Normal range of motion. He exhibits no edema, tenderness or deformity.  Lymphadenopathy:    He has no cervical adenopathy.  Neurological: He is alert and oriented to person, place, and time. He displays normal reflexes. No cranial nerve deficit or sensory deficit. He exhibits normal muscle tone. Coordination normal.  Skin: Skin is warm, dry and intact. Capillary refill takes less than 2 seconds. No rash noted. He is not diaphoretic. No erythema. No pallor.  Psychiatric: He has a normal mood and affect. His speech is normal and behavior is normal. Judgment and thought content normal. Cognition and memory are normal.  Nursing note and vitals reviewed.   Cognitive Testing - 6-CIT- unable to do due to developmental delay  Results for orders placed or performed in visit on 12/19/17  CBC with Differential/Platelet  Result Value Ref Range   WBC 8.8 3.4 - 10.8 x10E3/uL   RBC 3.77 (L) 4.14 - 5.80 x10E6/uL   Hemoglobin 11.7 (L) 13.0 - 17.7 g/dL   Hematocrit 16.1 (L) 09.6 - 51.0 %   MCV 94 79 - 97 fL   MCH 31.0 26.6 - 33.0 pg   MCHC 33.1 31.5 - 35.7 g/dL   RDW 04.5 40.9 - 81.1 %   Platelets 230 150 - 450 x10E3/uL   Neutrophils 61 Not Estab. %   Lymphs 26 Not Estab. %   Monocytes 12 Not Estab. %   Eos 0 Not Estab. %   Basos 0 Not Estab. %  Neutrophils Absolute 5.3 1.4 - 7.0 x10E3/uL   Lymphocytes Absolute 2.3 0.7 - 3.1 x10E3/uL   Monocytes  Absolute 1.1 (H) 0.1 - 0.9 x10E3/uL   EOS (ABSOLUTE) 0.0 0.0 - 0.4 x10E3/uL   Basophils Absolute 0.0 0.0 - 0.2 x10E3/uL   Immature Granulocytes 1 Not Estab. %   Immature Grans (Abs) 0.1 0.0 - 0.1 x10E3/uL  Comprehensive metabolic panel  Result Value Ref Range   Glucose 119 (H) 65 - 99 mg/dL   BUN 9 6 - 24 mg/dL   Creatinine, Ser 4.09 0.76 - 1.27 mg/dL   GFR calc non Af Amer 101 >59 mL/min/1.73   GFR calc Af Amer 117 >59 mL/min/1.73   BUN/Creatinine Ratio 11 9 - 20   Sodium 134 134 - 144 mmol/L   Potassium 4.0 3.5 - 5.2 mmol/L   Chloride 102 96 - 106 mmol/L   CO2 19 (L) 20 - 29 mmol/L   Calcium 9.2 8.7 - 10.2 mg/dL   Total Protein 7.1 6.0 - 8.5 g/dL   Albumin 4.4 3.5 - 5.5 g/dL   Globulin, Total 2.7 1.5 - 4.5 g/dL   Albumin/Globulin Ratio 1.6 1.2 - 2.2   Bilirubin Total 0.2 0.0 - 1.2 mg/dL   Alkaline Phosphatase 38 (L) 39 - 117 IU/L   AST 15 0 - 40 IU/L   ALT 11 0 - 44 IU/L  Lipid Panel w/o Chol/HDL Ratio  Result Value Ref Range   Cholesterol, Total 191 100 - 199 mg/dL   Triglycerides 811 (H) 0 - 149 mg/dL   HDL 35 (L) >91 mg/dL   VLDL Cholesterol Cal 36 5 - 40 mg/dL   LDL Calculated 478 (H) 0 - 99 mg/dL  PSA  Result Value Ref Range   Prostate Specific Ag, Serum 0.2 0.0 - 4.0 ng/mL  TSH  Result Value Ref Range   TSH 2.790 0.450 - 4.500 uIU/mL  UA/M w/rflx Culture, Routine  Result Value Ref Range   Specific Gravity, UA 1.015 1.005 - 1.030   pH, UA 6.0 5.0 - 7.5   Color, UA Orange Yellow   Appearance Ur Clear Clear   Leukocytes, UA Negative Negative   Protein, UA Negative Negative/Trace   Glucose, UA Negative Negative   Ketones, UA Trace (A) Negative   RBC, UA Negative Negative   Bilirubin, UA Negative Negative   Urobilinogen, Ur 0.2 0.2 - 1.0 mg/dL   Nitrite, UA Negative Negative  Valproic Acid level  Result Value Ref Range   Valproic Acid Lvl 81 50 - 100 ug/mL  Carbamazepine Level (Tegretol), total  Result Value Ref Range   Carbamazepine (Tegretol), S 7.0 4.0  - 12.0 ug/mL  HIV antibody  Result Value Ref Range   HIV Screen 4th Generation wRfx Non Reactive Non Reactive      Assessment & Plan:   Problem List Items Addressed This Visit      Nervous and Auditory   Epilepsy (HCC)    Stable. Continue to follow with neurology. Call with any concerns. Continue to monitor.       Relevant Orders   CBC with Differential/Platelet (Completed)   Comprehensive metabolic panel (Completed)   TSH (Completed)   UA/M w/rflx Culture, Routine (Completed)   CBC with Differential/Platelet (Completed)   Comprehensive metabolic panel (Completed)   TSH (Completed)   UA/M w/rflx Culture, Routine (Completed)   Epileptic seizures (HCC)    Stable. Continue to follow with neurology. Call with any concerns. Continue to monitor.       Relevant  Orders   CBC with Differential/Platelet (Completed)   Comprehensive metabolic panel (Completed)   TSH (Completed)   UA/M w/rflx Culture, Routine (Completed)   CBC with Differential/Platelet (Completed)   Comprehensive metabolic panel (Completed)   TSH (Completed)   UA/M w/rflx Culture, Routine (Completed)     Other   Intellectual disability    Lives in group home. Has a lot of help. No concerns.       Relevant Orders   CBC with Differential/Platelet (Completed)   Comprehensive metabolic panel (Completed)   TSH (Completed)   UA/M w/rflx Culture, Routine (Completed)   Hyperlipidemia    Rechecking levels today. Continue current regimen. Continue to monitor. Call with any concerns. Refills given.       Relevant Orders   CBC with Differential/Platelet (Completed)   Comprehensive metabolic panel (Completed)   Lipid Panel w/o Chol/HDL Ratio (Completed)   TSH (Completed)   UA/M w/rflx Culture, Routine (Completed)    Other Visit Diagnoses    Medicare annual wellness visit, subsequent    -  Primary   Preventative care discussed as below.    Long-term current use of high risk medication other than anticoagulant        Labs drawn today. Await results.   Relevant Orders   CBC with Differential/Platelet (Completed)   Comprehensive metabolic panel (Completed)   TSH (Completed)   UA/M w/rflx Culture, Routine (Completed)   Valproic Acid level (Completed)   Carbamazepine Level (Tegretol), total (Completed)   Hesitancy       Labs drawn today. Await results.   Relevant Orders   CBC with Differential/Platelet (Completed)   Comprehensive metabolic panel (Completed)   PSA (Completed)   TSH (Completed)   UA/M w/rflx Culture, Routine (Completed)   Screening for HIV without presence of risk factors       Labs drawn today. Await results.   Relevant Orders   HIV antibody (Completed)       Preventative Services:  Health Risk Assessment and Personalized Prevention Plan: Done today Bone Mass Measurements: N/A CVD Screening: Done today Colon Cancer Screening: Cannot do  Depression Screening: Done today Diabetes Screening: Done today Glaucoma Screening: See your eye doctor Hepatitis B vaccine: N/A Hepatitis C screening: Up to date HIV Screening: Done today Flu Vaccine: Get in October Lung cancer Screening: N/A Obesity Screening: Done today Pneumonia Vaccines (2): up to date STI Screening: N/A PSA screening: Done today.  Discussed aspirin prophylaxis for myocardial infarction prevention and decision was it was not indicated  LABORATORY TESTING:  Health maintenance labs ordered today as discussed above.   The natural history of prostate cancer and ongoing controversy regarding screening and potential treatment outcomes of prostate cancer has been discussed with the patient. The meaning of a false positive PSA and a false negative PSA has been discussed. He indicates understanding of the limitations of this screening test and wishes to proceed with screening PSA testing.   IMMUNIZATIONS:   - Tdap: Tetanus vaccination status reviewed: last tetanus booster within 10 years. - Influenza: Up to date -  Pneumovax: Up to date - Prevnar: Not applicable - Zostavax vaccine: Not applicable  SCREENING: - Colonoscopy: unable to do with group home  Discussed with patient purpose of the colonoscopy is to detect colon cancer at curable precancerous or early stages   PATIENT COUNSELING:    Sexuality: Discussed sexually transmitted diseases, partner selection, use of condoms, avoidance of unintended pregnancy  and contraceptive alternatives.   Advised to avoid cigarette smoking.  I  discussed with the patient that most people either abstain from alcohol or drink within safe limits (<=14/week and <=4 drinks/occasion for males, <=7/weeks and <= 3 drinks/occasion for females) and that the risk for alcohol disorders and other health effects rises proportionally with the number of drinks per week and how often a drinker exceeds daily limits.  Discussed cessation/primary prevention of drug use and availability of treatment for abuse.   Diet: Encouraged to adjust caloric intake to maintain  or achieve ideal body weight, to reduce intake of dietary saturated fat and total fat, to limit sodium intake by avoiding high sodium foods and not adding table salt, and to maintain adequate dietary potassium and calcium preferably from fresh fruits, vegetables, and low-fat dairy products.    stressed the importance of regular exercise  Injury prevention: Discussed safety belts, safety helmets, smoke detector, smoking near bedding or upholstery.   Dental health: Discussed importance of regular tooth brushing, flossing, and dental visits.   Follow up plan: NEXT PREVENTATIVE PHYSICAL DUE IN 1 YEAR. Return in about 6 months (around 06/21/2018).

## 2017-12-20 LAB — COMPREHENSIVE METABOLIC PANEL
ALBUMIN: 4.4 g/dL (ref 3.5–5.5)
ALT: 11 IU/L (ref 0–44)
AST: 15 IU/L (ref 0–40)
Albumin/Globulin Ratio: 1.6 (ref 1.2–2.2)
Alkaline Phosphatase: 38 IU/L — ABNORMAL LOW (ref 39–117)
BUN / CREAT RATIO: 11 (ref 9–20)
BUN: 9 mg/dL (ref 6–24)
Bilirubin Total: 0.2 mg/dL (ref 0.0–1.2)
CALCIUM: 9.2 mg/dL (ref 8.7–10.2)
CO2: 19 mmol/L — AB (ref 20–29)
CREATININE: 0.79 mg/dL (ref 0.76–1.27)
Chloride: 102 mmol/L (ref 96–106)
GFR, EST AFRICAN AMERICAN: 117 mL/min/{1.73_m2} (ref >59)
GFR, EST NON AFRICAN AMERICAN: 101 mL/min/{1.73_m2} (ref >59)
GLUCOSE: 119 mg/dL — AB (ref 65–99)
Globulin, Total: 2.7 g/dL (ref 1.5–4.5)
Potassium: 4 mmol/L (ref 3.5–5.2)
Sodium: 134 mmol/L (ref 134–144)
TOTAL PROTEIN: 7.1 g/dL (ref 6.0–8.5)

## 2017-12-20 LAB — CBC WITH DIFFERENTIAL/PLATELET
BASOS ABS: 0 10*3/uL (ref 0.0–0.2)
Basos: 0 %
EOS (ABSOLUTE): 0 10*3/uL (ref 0.0–0.4)
Eos: 0 %
HEMOGLOBIN: 11.7 g/dL — AB (ref 13.0–17.7)
Hematocrit: 35.3 % — ABNORMAL LOW (ref 37.5–51.0)
IMMATURE GRANS (ABS): 0.1 10*3/uL (ref 0.0–0.1)
IMMATURE GRANULOCYTES: 1 %
LYMPHS: 26 %
Lymphocytes Absolute: 2.3 10*3/uL (ref 0.7–3.1)
MCH: 31 pg (ref 26.6–33.0)
MCHC: 33.1 g/dL (ref 31.5–35.7)
MCV: 94 fL (ref 79–97)
MONOCYTES: 12 %
Monocytes Absolute: 1.1 10*3/uL — ABNORMAL HIGH (ref 0.1–0.9)
NEUTROS ABS: 5.3 10*3/uL (ref 1.4–7.0)
Neutrophils: 61 %
Platelets: 230 10*3/uL (ref 150–450)
RBC: 3.77 x10E6/uL — AB (ref 4.14–5.80)
RDW: 14.3 % (ref 12.3–15.4)
WBC: 8.8 10*3/uL (ref 3.4–10.8)

## 2017-12-20 LAB — HIV ANTIBODY (ROUTINE TESTING W REFLEX): HIV Screen 4th Generation wRfx: NONREACTIVE

## 2017-12-20 LAB — CARBAMAZEPINE LEVEL, TOTAL: Carbamazepine (Tegretol), S: 7 ug/mL (ref 4.0–12.0)

## 2017-12-20 LAB — LIPID PANEL W/O CHOL/HDL RATIO
Cholesterol, Total: 191 mg/dL (ref 100–199)
HDL: 35 mg/dL — AB (ref >39)
LDL CALC: 120 mg/dL — AB (ref 0–99)
Triglycerides: 178 mg/dL — ABNORMAL HIGH (ref 0–149)
VLDL CHOLESTEROL CAL: 36 mg/dL (ref 5–40)

## 2017-12-20 LAB — TSH: TSH: 2.79 u[IU]/mL (ref 0.450–4.500)

## 2017-12-20 LAB — VALPROIC ACID LEVEL: Valproic Acid Lvl: 81 ug/mL (ref 50–100)

## 2017-12-20 LAB — PSA: Prostate Specific Ag, Serum: 0.2 ng/mL (ref 0.0–4.0)

## 2017-12-23 ENCOUNTER — Encounter: Payer: Self-pay | Admitting: Family Medicine

## 2017-12-23 NOTE — Assessment & Plan Note (Signed)
Lives in group home. Has a lot of help. No concerns.

## 2017-12-23 NOTE — Assessment & Plan Note (Signed)
Rechecking levels today. Continue current regimen. Continue to monitor. Call with any concerns. Refills given.

## 2017-12-23 NOTE — Assessment & Plan Note (Signed)
Stable. Continue to follow with neurology. Call with any concerns. Continue to monitor.  

## 2017-12-27 ENCOUNTER — Encounter: Payer: Self-pay | Admitting: Family Medicine

## 2018-01-29 ENCOUNTER — Telehealth: Payer: Self-pay

## 2018-01-29 NOTE — Telephone Encounter (Signed)
Call Raymond [name]

## 2018-01-29 NOTE — Telephone Encounter (Signed)
Received fax stating:   "Hey Jada:   It's Amada Jupiterale at [facility]. Re: Raymond [name] (DOB: 01/13/1961). The labs that he had in May, whoever looked at them, said that they were OK. His RBC, Hemoglobin, & Hematocrit were all low! This concerns me as he is very listless & Lethargic. Anytime that we are at the house he is laying in the bed & a lot of that time he is actually asleep. I thought that maybe these low labs could be a reason? I don't see where he was tested for any vitamin deficiencies. Could you please check with the DR & See what they think about this and get back with me?   Thanks a Lot! Amada JupiterDale [phone]"  Original letter placed in scan box

## 2018-02-04 NOTE — Telephone Encounter (Signed)
Amada JupiterDale called from [facility] wanting Dr Dossie Arbourrissman to give her a call @ [phone] regarding a concern she has about the patient being so lethargic...  Thank you

## 2018-02-04 NOTE — Telephone Encounter (Signed)
Patient was transferred to provider for telephone conversation.   

## 2018-03-05 DIAGNOSIS — F919 Conduct disorder, unspecified: Secondary | ICD-10-CM | POA: Diagnosis not present

## 2018-03-21 DIAGNOSIS — E559 Vitamin D deficiency, unspecified: Secondary | ICD-10-CM | POA: Diagnosis not present

## 2018-04-09 DIAGNOSIS — J019 Acute sinusitis, unspecified: Secondary | ICD-10-CM | POA: Diagnosis not present

## 2018-04-24 ENCOUNTER — Telehealth: Payer: Self-pay | Admitting: Family Medicine

## 2018-04-24 MED ORDER — GUAIFENESIN ER 600 MG PO TB12: 600.0000 mg | ORAL_TABLET | Freq: Two times a day (BID) | ORAL | 0 refills | Status: DC | PRN

## 2018-04-24 NOTE — Telephone Encounter (Signed)
Copied from CRM [id number]. Topic: General - Other >> Apr 24, 2018 10:16 AM Gerrianne Scale wrote: Reason for CRM: [name] care giver at [facility] [phone] calling to get a RX for Mucinex ER for phlegm in throat and head congestion  [facility] - [place], [place] - [address] [phone] (Phone) [phone] (Fax)

## 2018-05-12 DIAGNOSIS — G40909 Epilepsy, unspecified, not intractable, without status epilepticus: Secondary | ICD-10-CM | POA: Diagnosis not present

## 2018-05-12 DIAGNOSIS — R4189 Other symptoms and signs involving cognitive functions and awareness: Secondary | ICD-10-CM | POA: Diagnosis not present

## 2018-05-12 DIAGNOSIS — R011 Cardiac murmur, unspecified: Secondary | ICD-10-CM | POA: Diagnosis not present

## 2018-05-12 DIAGNOSIS — R5383 Other fatigue: Secondary | ICD-10-CM | POA: Diagnosis not present

## 2018-05-12 DIAGNOSIS — Z125 Encounter for screening for malignant neoplasm of prostate: Secondary | ICD-10-CM | POA: Diagnosis not present

## 2018-05-12 DIAGNOSIS — E785 Hyperlipidemia, unspecified: Secondary | ICD-10-CM | POA: Diagnosis not present

## 2018-05-12 DIAGNOSIS — D649 Anemia, unspecified: Secondary | ICD-10-CM | POA: Diagnosis not present

## 2018-05-12 DIAGNOSIS — Z23 Encounter for immunization: Secondary | ICD-10-CM | POA: Diagnosis not present

## 2018-05-12 DIAGNOSIS — R5381 Other malaise: Secondary | ICD-10-CM | POA: Diagnosis not present

## 2018-05-20 DIAGNOSIS — D649 Anemia, unspecified: Secondary | ICD-10-CM | POA: Diagnosis not present

## 2018-07-01 DIAGNOSIS — E559 Vitamin D deficiency, unspecified: Secondary | ICD-10-CM | POA: Diagnosis not present

## 2018-07-02 DIAGNOSIS — F919 Conduct disorder, unspecified: Secondary | ICD-10-CM | POA: Diagnosis not present

## 2018-08-18 DIAGNOSIS — R011 Cardiac murmur, unspecified: Secondary | ICD-10-CM | POA: Diagnosis not present

## 2018-08-18 DIAGNOSIS — I341 Nonrheumatic mitral (valve) prolapse: Secondary | ICD-10-CM | POA: Diagnosis not present

## 2018-08-18 DIAGNOSIS — R42 Dizziness and giddiness: Secondary | ICD-10-CM | POA: Diagnosis not present

## 2018-08-18 DIAGNOSIS — E785 Hyperlipidemia, unspecified: Secondary | ICD-10-CM | POA: Diagnosis not present

## 2018-08-18 DIAGNOSIS — G40909 Epilepsy, unspecified, not intractable, without status epilepticus: Secondary | ICD-10-CM | POA: Diagnosis not present

## 2018-08-18 DIAGNOSIS — F79 Unspecified intellectual disabilities: Secondary | ICD-10-CM | POA: Diagnosis not present

## 2018-08-29 DIAGNOSIS — I341 Nonrheumatic mitral (valve) prolapse: Secondary | ICD-10-CM | POA: Diagnosis not present

## 2018-09-02 DIAGNOSIS — R011 Cardiac murmur, unspecified: Secondary | ICD-10-CM | POA: Diagnosis not present

## 2018-09-02 DIAGNOSIS — R42 Dizziness and giddiness: Secondary | ICD-10-CM | POA: Diagnosis not present

## 2018-09-02 DIAGNOSIS — D649 Anemia, unspecified: Secondary | ICD-10-CM | POA: Diagnosis not present

## 2018-09-02 DIAGNOSIS — E785 Hyperlipidemia, unspecified: Secondary | ICD-10-CM | POA: Diagnosis not present

## 2018-09-02 DIAGNOSIS — R1084 Generalized abdominal pain: Secondary | ICD-10-CM | POA: Diagnosis not present

## 2018-09-02 DIAGNOSIS — I341 Nonrheumatic mitral (valve) prolapse: Secondary | ICD-10-CM | POA: Diagnosis not present

## 2018-09-02 DIAGNOSIS — F79 Unspecified intellectual disabilities: Secondary | ICD-10-CM | POA: Diagnosis not present

## 2018-09-02 DIAGNOSIS — R197 Diarrhea, unspecified: Secondary | ICD-10-CM | POA: Diagnosis not present

## 2018-09-02 DIAGNOSIS — G40909 Epilepsy, unspecified, not intractable, without status epilepticus: Secondary | ICD-10-CM | POA: Diagnosis not present

## 2018-09-02 DIAGNOSIS — I340 Nonrheumatic mitral (valve) insufficiency: Secondary | ICD-10-CM | POA: Diagnosis not present

## 2018-09-02 DIAGNOSIS — R112 Nausea with vomiting, unspecified: Secondary | ICD-10-CM | POA: Diagnosis not present

## 2018-09-02 DIAGNOSIS — R569 Unspecified convulsions: Secondary | ICD-10-CM | POA: Diagnosis not present

## 2018-11-06 DIAGNOSIS — R5383 Other fatigue: Secondary | ICD-10-CM | POA: Diagnosis not present

## 2018-11-06 DIAGNOSIS — D649 Anemia, unspecified: Secondary | ICD-10-CM | POA: Diagnosis not present

## 2018-11-06 DIAGNOSIS — R5381 Other malaise: Secondary | ICD-10-CM | POA: Diagnosis not present

## 2018-11-06 DIAGNOSIS — Z125 Encounter for screening for malignant neoplasm of prostate: Secondary | ICD-10-CM | POA: Diagnosis not present

## 2018-11-06 DIAGNOSIS — E785 Hyperlipidemia, unspecified: Secondary | ICD-10-CM | POA: Diagnosis not present

## 2018-11-13 DIAGNOSIS — R5383 Other fatigue: Secondary | ICD-10-CM | POA: Diagnosis not present

## 2018-11-13 DIAGNOSIS — Z1211 Encounter for screening for malignant neoplasm of colon: Secondary | ICD-10-CM | POA: Diagnosis not present

## 2018-11-13 DIAGNOSIS — E785 Hyperlipidemia, unspecified: Secondary | ICD-10-CM | POA: Diagnosis not present

## 2018-11-13 DIAGNOSIS — G40909 Epilepsy, unspecified, not intractable, without status epilepticus: Secondary | ICD-10-CM | POA: Diagnosis not present

## 2018-11-13 DIAGNOSIS — D649 Anemia, unspecified: Secondary | ICD-10-CM | POA: Diagnosis not present

## 2018-11-13 DIAGNOSIS — R634 Abnormal weight loss: Secondary | ICD-10-CM | POA: Diagnosis not present

## 2018-11-13 DIAGNOSIS — R5381 Other malaise: Secondary | ICD-10-CM | POA: Diagnosis not present

## 2018-12-09 DIAGNOSIS — R110 Nausea: Secondary | ICD-10-CM | POA: Diagnosis not present

## 2018-12-09 DIAGNOSIS — R1013 Epigastric pain: Secondary | ICD-10-CM | POA: Diagnosis not present

## 2018-12-09 DIAGNOSIS — G40909 Epilepsy, unspecified, not intractable, without status epilepticus: Secondary | ICD-10-CM | POA: Diagnosis not present

## 2018-12-09 DIAGNOSIS — I341 Nonrheumatic mitral (valve) prolapse: Secondary | ICD-10-CM | POA: Diagnosis not present

## 2018-12-09 DIAGNOSIS — E785 Hyperlipidemia, unspecified: Secondary | ICD-10-CM | POA: Diagnosis not present

## 2018-12-09 DIAGNOSIS — R1314 Dysphagia, pharyngoesophageal phase: Secondary | ICD-10-CM | POA: Diagnosis not present

## 2018-12-09 DIAGNOSIS — K5909 Other constipation: Secondary | ICD-10-CM | POA: Diagnosis not present

## 2018-12-09 DIAGNOSIS — Z1211 Encounter for screening for malignant neoplasm of colon: Secondary | ICD-10-CM | POA: Diagnosis not present

## 2019-02-13 DIAGNOSIS — F919 Conduct disorder, unspecified: Secondary | ICD-10-CM | POA: Diagnosis not present

## 2019-03-16 DIAGNOSIS — I341 Nonrheumatic mitral (valve) prolapse: Secondary | ICD-10-CM | POA: Diagnosis not present

## 2019-03-16 DIAGNOSIS — I340 Nonrheumatic mitral (valve) insufficiency: Secondary | ICD-10-CM | POA: Diagnosis not present

## 2019-03-16 DIAGNOSIS — F79 Unspecified intellectual disabilities: Secondary | ICD-10-CM | POA: Diagnosis not present

## 2019-03-16 DIAGNOSIS — R569 Unspecified convulsions: Secondary | ICD-10-CM | POA: Diagnosis not present

## 2019-03-16 DIAGNOSIS — R011 Cardiac murmur, unspecified: Secondary | ICD-10-CM | POA: Diagnosis not present

## 2019-03-16 DIAGNOSIS — G40909 Epilepsy, unspecified, not intractable, without status epilepticus: Secondary | ICD-10-CM | POA: Diagnosis not present

## 2019-03-16 DIAGNOSIS — R42 Dizziness and giddiness: Secondary | ICD-10-CM | POA: Diagnosis not present

## 2019-03-16 DIAGNOSIS — E785 Hyperlipidemia, unspecified: Secondary | ICD-10-CM | POA: Diagnosis not present

## 2019-03-19 ENCOUNTER — Other Ambulatory Visit: Admission: RE | Admit: 2019-03-19 | Payer: Medicare Other | Source: Ambulatory Visit

## 2019-03-20 ENCOUNTER — Encounter: Payer: Self-pay | Admitting: *Deleted

## 2019-03-23 ENCOUNTER — Other Ambulatory Visit: Payer: Self-pay

## 2019-03-23 ENCOUNTER — Other Ambulatory Visit
Admission: RE | Admit: 2019-03-23 | Discharge: 2019-03-23 | Disposition: A | Discharge status: Home / Self Care | Payer: Medicare Other | Source: Ambulatory Visit | Attending: Internal Medicine | Admitting: Internal Medicine

## 2019-03-23 ENCOUNTER — Encounter: Payer: Self-pay | Admitting: *Deleted

## 2019-03-23 ENCOUNTER — Ambulatory Visit
Admission: RE | Admit: 2019-03-23 | Discharge: 2019-03-23 | Disposition: A | Discharge status: Home / Self Care | Payer: Medicare Other | Attending: Internal Medicine | Admitting: Internal Medicine

## 2019-03-23 ENCOUNTER — Ambulatory Visit: Payer: Medicare Other | Admitting: Anesthesiology

## 2019-03-23 ENCOUNTER — Encounter
Admission: RE | Disposition: A | Discharge status: Home / Self Care | Payer: Self-pay | Source: Home / Self Care | Attending: Internal Medicine

## 2019-03-23 DIAGNOSIS — K6289 Other specified diseases of anus and rectum: Secondary | ICD-10-CM | POA: Diagnosis not present

## 2019-03-23 DIAGNOSIS — K319 Disease of stomach and duodenum, unspecified: Secondary | ICD-10-CM | POA: Diagnosis not present

## 2019-03-23 DIAGNOSIS — E785 Hyperlipidemia, unspecified: Secondary | ICD-10-CM | POA: Insufficient documentation

## 2019-03-23 DIAGNOSIS — F79 Unspecified intellectual disabilities: Secondary | ICD-10-CM | POA: Insufficient documentation

## 2019-03-23 DIAGNOSIS — Z20828 Contact with and (suspected) exposure to other viral communicable diseases: Secondary | ICD-10-CM | POA: Insufficient documentation

## 2019-03-23 DIAGNOSIS — Z1211 Encounter for screening for malignant neoplasm of colon: Secondary | ICD-10-CM | POA: Diagnosis not present

## 2019-03-23 DIAGNOSIS — K228 Other specified diseases of esophagus: Secondary | ICD-10-CM | POA: Diagnosis not present

## 2019-03-23 DIAGNOSIS — K210 Gastro-esophageal reflux disease with esophagitis: Secondary | ICD-10-CM | POA: Diagnosis not present

## 2019-03-23 DIAGNOSIS — K449 Diaphragmatic hernia without obstruction or gangrene: Secondary | ICD-10-CM | POA: Diagnosis not present

## 2019-03-23 DIAGNOSIS — F429 Obsessive-compulsive disorder, unspecified: Secondary | ICD-10-CM | POA: Diagnosis not present

## 2019-03-23 DIAGNOSIS — R1310 Dysphagia, unspecified: Secondary | ICD-10-CM | POA: Diagnosis not present

## 2019-03-23 DIAGNOSIS — K219 Gastro-esophageal reflux disease without esophagitis: Secondary | ICD-10-CM | POA: Diagnosis not present

## 2019-03-23 DIAGNOSIS — Z7982 Long term (current) use of aspirin: Secondary | ICD-10-CM | POA: Diagnosis not present

## 2019-03-23 DIAGNOSIS — Z7951 Long term (current) use of inhaled steroids: Secondary | ICD-10-CM | POA: Diagnosis not present

## 2019-03-23 DIAGNOSIS — K3189 Other diseases of stomach and duodenum: Secondary | ICD-10-CM | POA: Diagnosis not present

## 2019-03-23 DIAGNOSIS — G40909 Epilepsy, unspecified, not intractable, without status epilepticus: Secondary | ICD-10-CM | POA: Insufficient documentation

## 2019-03-23 DIAGNOSIS — R109 Unspecified abdominal pain: Secondary | ICD-10-CM | POA: Diagnosis not present

## 2019-03-23 DIAGNOSIS — F419 Anxiety disorder, unspecified: Secondary | ICD-10-CM | POA: Diagnosis not present

## 2019-03-23 DIAGNOSIS — Z79899 Other long term (current) drug therapy: Secondary | ICD-10-CM | POA: Insufficient documentation

## 2019-03-23 DIAGNOSIS — K2960 Other gastritis without bleeding: Secondary | ICD-10-CM | POA: Diagnosis not present

## 2019-03-23 DIAGNOSIS — K2970 Gastritis, unspecified, without bleeding: Secondary | ICD-10-CM | POA: Diagnosis not present

## 2019-03-23 DIAGNOSIS — R110 Nausea: Secondary | ICD-10-CM | POA: Diagnosis not present

## 2019-03-23 HISTORY — PX: COLONOSCOPY WITH PROPOFOL: SHX5780

## 2019-03-23 HISTORY — PX: ESOPHAGOGASTRODUODENOSCOPY (EGD) WITH PROPOFOL: SHX5813

## 2019-03-23 LAB — SARS CORONAVIRUS 2 BY RT PCR (HOSPITAL ORDER, PERFORMED IN ~~LOC~~ HOSPITAL LAB): SARS Coronavirus 2: NEGATIVE

## 2019-03-23 SURGERY — ESOPHAGOGASTRODUODENOSCOPY (EGD) WITH PROPOFOL
Anesthesia: General

## 2019-03-23 MED ORDER — SODIUM CHLORIDE 0.9 % IV SOLN
INTRAVENOUS | Status: DC
  Administered 2019-03-23: 10:00:00 via INTRAVENOUS

## 2019-03-23 MED ORDER — LIDOCAINE HCL (PF) 2 % IJ SOLN
INTRAMUSCULAR | Status: AC
  Filled 2019-03-23: qty 10

## 2019-03-23 MED ORDER — PROPOFOL 10 MG/ML IV BOLUS
INTRAVENOUS | Status: AC
  Filled 2019-03-23: qty 40

## 2019-03-23 MED ORDER — PROPOFOL 10 MG/ML IV BOLUS
INTRAVENOUS | Status: DC | PRN
  Administered 2019-03-23: 30 mg via INTRAVENOUS
  Administered 2019-03-23 (×2): 20 mg via INTRAVENOUS
  Administered 2019-03-23: 80 mg via INTRAVENOUS
  Administered 2019-03-23: 20 mg via INTRAVENOUS

## 2019-03-23 MED ORDER — LIDOCAINE HCL (CARDIAC) PF 100 MG/5ML IV SOSY
PREFILLED_SYRINGE | INTRAVENOUS | Status: DC | PRN
  Administered 2019-03-23: 60 mg via INTRATRACHEAL

## 2019-03-23 NOTE — Op Note (Signed)
[facility] Gastroenter[facility] Patient Name: Raymond SinghCharles [name] Procedure Date: 03/23/2019 10:15 AM MRN: [id number] Account #: [id number] Date of Birth: 1961-02-14 Admit Type: Outpatient Age: [age] [facility] Gender: Male Note Status: Finalized Procedure:            Upper GI endoscopy Indications:          Dysphagia Providers:            Boykin Nearingeodoro K. Toledo MD, MD Medicines:            Propofol per Anesthesia Complications:        No immediate complications. Procedure:            Pre-Anesthesia Assessment:                       - The risks and benefits of the procedure and the                        sedation options and risks were discussed with the                        patient. All questions were answered and informed                        consent was obtained.                       - Patient identification and proposed procedure were                        verified prior to the procedure by the nurse. The                        procedure was verified in the procedure room.                       - ASA Grade Assessment: III - A patient with severe                        systemic disease.                       - After reviewing the risks and benefits, the patient                        was deemed in satisfactory condition to undergo the                        procedure.                       After obtaining informed consent, the endoscope was                        passed under direct vision. Throughout the procedure,                        the patient's blood pressure, pulse, and oxygen                        saturations were monitored continuously. The Endoscope  was introduced through the mouth, and advanced to the                        third part of duodenum. The upper GI endoscopy was                        accomplished without difficulty. The patient tolerated                        the procedure well. Findings:      The Z-line  was irregular and was found 35 to 36 cm from the incisors.       Mucosa was biopsied with a cold forceps for histology. One specimen       bottle was sent to pathology.      There is no endoscopic evidence of esophagitis, inflammation, stenosis,       stricture or mass in the entire esophagus.      Localized mild inflammation characterized by congestion (edema) and       erythema was found in the prepyloric region of the stomach. Biopsies       were taken with a cold forceps for Helicobacter pylori testing.      A 1 cm hiatal hernia was present.      The examined duodenum was normal.      The exam was otherwise without abnormality. Impression:           - Z-line irregular, 35 to 36 cm from the incisors.                        Biopsied.                       - Gastritis. Biopsied.                       - 1 cm hiatal hernia.                       - Normal examined duodenum.                       - The examination was otherwise normal. Recommendation:       - Await pathology results.                       - Proceed with colonoscopy Procedure Code(s):    --- Professional ---                       [id number], Esophagogastroduodenoscopy, flexible, transoral;                        with biopsy, single or multiple Diagnosis Code(s):    --- Professional ---                       R13.10, Dysphagia, unspecified                       K44.9, Diaphragmatic hernia without obstruction or                        gangrene  K29.70, Gastritis, unspecified, without bleeding                       K22.8, Other specified diseases of esophagus CPT copyright 2019 American Medical Association. All rights reserved. The codes documented in this report are preliminary and upon coder review may  be revised to meet current compliance requirements. Efrain Sella MD, MD 03/23/2019 10:44:12 AM This report has been signed electronically. Number of Addenda: 0 Note Initiated On: 03/23/2019 10:15  AM Estimated Blood Loss: Estimated blood loss: none.      [facility]

## 2019-03-23 NOTE — Anesthesia Postprocedure Evaluation (Signed)
Anesthesia Post Note  Patient: Raymond [name]  Procedure(s) Performed: ESOPHAGOGASTRODUODENOSCOPY (EGD) WITH PROPOFOL (N/A ) COLONOSCOPY WITH PROPOFOL (N/A )  Patient location during evaluation: PACU Anesthesia Type: General Level of consciousness: awake and alert Pain management: pain level controlled Vital Signs Assessment: post-procedure vital signs reviewed and stable Respiratory status: spontaneous breathing, nonlabored ventilation and respiratory function stable Cardiovascular status: blood pressure returned to baseline and stable Postop Assessment: no apparent nausea or vomiting Anesthetic complications: no     Last Vitals:  Vitals:   03/23/19 1130 03/23/19 1139  BP:  (!) 117/50  Pulse: 70 65  Resp: 17 19  Temp:    SpO2: 97%     Last Pain:  Vitals:   03/23/19 1100  TempSrc: Tympanic  PainSc:                  Durenda Hurt

## 2019-03-23 NOTE — Interval H&P Note (Signed)
History and Physical Interval Note:  03/23/2019 10:21 AM  Raymond [name]  has presented today for surgery, with the diagnosis of Nausea, Dysphagia; Abdominal Pain Colon Cancer Screening.  The various methods of treatment have been discussed with the patient and family. After consideration of risks, benefits and other options for treatment, the patient has consented to  Procedure(s) with comments: ESOPHAGOGASTRODUODENOSCOPY (EGD) WITH PROPOFOL (N/A) - NEEDS A RAPID COLONOSCOPY WITH PROPOFOL (N/A) as a surgical intervention.  The patient's history has been reviewed, patient examined, no change in status, stable for surgery.  I have reviewed the patient's chart and labs.  Questions were answered to the patient's satisfaction.     Hydro, [place]

## 2019-03-23 NOTE — Anesthesia Preprocedure Evaluation (Addendum)
Anesthesia Evaluation  Patient identified by MRN, date of birth, ID band Patient awake    Reviewed: Allergy & Precautions, H&P , NPO status , Patient's Chart, lab work & pertinent test results  Airway Mallampati: III  TM Distance: >3 FB     Dental  (+) Teeth Intact   Pulmonary neg pulmonary ROS,           Cardiovascular + Valvular Problems/Murmurs MR and MVP      Neuro/Psych Seizures -,  PSYCHIATRIC DISORDERS Anxiety Mental retardation    GI/Hepatic negative GI ROS, Neg liver ROS,   Endo/Other  negative endocrine ROS  Renal/GU negative Renal ROS  negative genitourinary   Musculoskeletal   Abdominal   Peds  Hematology negative hematology ROS (+)   Anesthesia Other Findings Past Medical History: No date: Epilepsy ([place]) No date: Epileptic seizures ([place]) No date: Hyperlipidemia No date: Mental retardation No date: OCD (obsessive compulsive disorder)     Comment:  water drinking No date: OCD (obsessive compulsive disorder) No date: Severe mitral regurgitation     Comment:  a. 02/2016 Echo: EF 55-60%, no rwma, MVP of posterior               leaflet w/ an anteriorly directed MR - severe. Sev dil               LA; b. TTE 8/18: EF of 55-60%, normal wall motion, normal              LV diastolic function parameters, severe mitral prolapse               involving the anterior leaflet and posterior leaflet with              severe regurgitation directed anteriorly, severely               dilated left atrium, unable to estimate the PASP No date: Systolic murmur  History reviewed. No pertinent surgical history.     Reproductive/Obstetrics negative OB ROS                            Anesthesia Physical Anesthesia Plan  ASA: III  Anesthesia Plan: General   Post-op Pain Management:    Induction:   PONV Risk Score and Plan: Propofol infusion and TIVA  Airway Management Planned: Natural  Airway and Nasal Cannula  Additional Equipment:   Intra-op Plan:   Post-operative Plan:   Informed Consent: I have reviewed the patients History and Physical, chart, labs and discussed the procedure including the risks, benefits and alternatives for the proposed anesthesia with the patient or authorized representative who has indicated his/her understanding and acceptance.     Dental Advisory Given  Plan Discussed with: Anesthesiologist and CRNA  Anesthesia Plan Comments:         Anesthesia Quick Evaluation

## 2019-03-23 NOTE — Op Note (Signed)
[facility] Gastroenter[facility] Patient Name: Raymond SinghCharles [name] Procedure Date: 03/23/2019 10:15 AM MRN: [id number] Account #: [id number] Date of Birth: 30-Oct-1960 Admit Type: Outpatient Age: [age] [facility] Gender: Male Note Status: Finalized Procedure:            Colonoscopy Indications:          Screening for colorectal malignant neoplasm Providers:            Boykin Nearingeodoro K. Kylan Veach MD, MD Medicines:            Propofol per Anesthesia Complications:        No immediate complications. Procedure:            Pre-Anesthesia Assessment:                       - The risks and benefits of the procedure and the                        sedation options and risks were discussed with the                        patient. All questions were answered and informed                        consent was obtained.                       - Patient identification and proposed procedure were                        verified prior to the procedure by the nurse. The                        procedure was verified in the procedure room.                       - ASA Grade Assessment: III - A patient with severe                        systemic disease.                       - After reviewing the risks and benefits, the patient                        was deemed in satisfactory condition to undergo the                        procedure.                       After obtaining informed consent, the colonoscope was                        passed under direct vision. Throughout the procedure,                        the patient's blood pressure, pulse, and oxygen                        saturations were monitored continuously. The  Colonoscope was introduced through the anus and                        advanced to the the cecum, identified by appendiceal                        orifice and ileocecal valve. The colonoscopy was                        performed without difficulty. The  patient tolerated the                        procedure well. The quality of the bowel preparation                        was good. The ileocecal valve, appendiceal orifice, and                        rectum were photographed. Findings:      The perianal examination was normal.      The digital rectal exam findings include decreased sphincter tone.       Pertinent negatives include no palpable rectal lesions.      The entire examined colon appeared normal on direct and retroflexion       views. Impression:           - Decreased sphincter tone found on digital rectal exam.                       - The entire examined colon is normal on direct and                        retroflexion views.                       - No specimens collected. Recommendation:       - Patient has a contact number available for                        emergencies. The signs and symptoms of potential                        delayed complications were discussed with the patient.                        Return to normal activities tomorrow. Written discharge                        instructions were provided to the patient.                       - Await pathology results from EGD, also performed                        today.                       - Resume previous diet.                       - Continue present medications.                       -  Repeat colonoscopy in 10 years for screening purposes.                       - Return to physician assistant in 6 months.                       - The findings and recommendations were discussed with                        the patient.                       - The findings and recommendations were discussed with                        the designated responsible adult. Procedure Code(s):    --- Professional ---                       R4270, Colorectal cancer screening; colonoscopy on                        individual not meeting criteria for high risk Diagnosis Code(s):    ---  Professional ---                       K62.89, Other specified diseases of anus and rectum                       Z12.11, Encounter for screening for malignant neoplasm                        of colon CPT copyright 2019 American Medical Association. All rights reserved. The codes documented in this report are preliminary and upon coder review may  be revised to meet current compliance requirements. Efrain Sella MD, MD 03/23/2019 10:57:48 AM This report has been signed electronically. Number of Addenda: 0 Note Initiated On: 03/23/2019 10:15 AM Scope Withdrawal Time: 0 hours 6 minutes 16 seconds  Total Procedure Duration: 0 hours 9 minutes 8 seconds  Estimated Blood Loss: Estimated blood loss: none.      [facility]

## 2019-03-23 NOTE — Transfer of Care (Signed)
Immediate Anesthesia Transfer of Care Note  Patient: Raymond [name]  Procedure(s) Performed: ESOPHAGOGASTRODUODENOSCOPY (EGD) WITH PROPOFOL (N/A ) COLONOSCOPY WITH PROPOFOL (N/A )  Patient Location: Endoscopy Unit  Anesthesia Type:General  Level of Consciousness: awake  Airway & Oxygen Therapy: Patient Spontanous Breathing  Post-op Assessment: Report given to RN and Post -op Vital signs reviewed and stable  Post vital signs: Reviewed and stable  Last Vitals:  Vitals Value Taken Time  BP    Temp    Pulse    Resp    SpO2      Last Pain:  Vitals:   03/23/19 1008  TempSrc: Tympanic  PainSc: 0-No pain         Complications: No apparent anesthesia complications

## 2019-03-23 NOTE — Anesthesia Post-op Follow-up Note (Signed)
Anesthesia QCDR form completed.        

## 2019-03-23 NOTE — H&P (Signed)
Outpatient short stay form Pre-procedure 03/23/2019 10:20 AM Raymond [name], M.D.  Primary Physician: Maryland Pink, MD  Reason for visit: Pharyngeal esophageal dysphagia, colon cancer screening.  History of present illness: Patient is a pleasant 58 year old male with a history of developmental delay, mental retardation and seizure disorder presenting for pharyngeal esophageal dysphagia.  He is colonoscopy nave.  There is a rare occurrences of fecal incontinence without rectal bleeding or overt weight loss.    Current Facility-Administered Medications:  .  0.9 %  sodium chloride infusion, , Intravenous, Continuous, Toledo, Benay Pike, MD  Medications Prior to Admission  Medication Sig Dispense Refill Last Dose  . albuterol (PROVENTIL HFA;VENTOLIN HFA) 108 (90 Base) MCG/ACT inhaler Inhale 2 puffs into the lungs every 6 (six) hours as needed for wheezing or shortness of breath. 1 Inhaler 0 Past Week at Unknown time  . aspirin EC 81 MG tablet Take 81 mg by mouth daily.   Past Week at Unknown time  . carbamazepine (TEGRETOL) 200 MG tablet Take 1 tablet (200 mg total) by mouth 3 (three) times daily. 90 tablet 6 Past Week at Unknown time  . cholecalciferol (VITAMIN D) 1000 units tablet Take 1,000 Units by mouth daily.   Past Week at Unknown time  . Cyanocobalamin (VITAMIN B 12 PO) Take by mouth.   Past Week at Unknown time  . divalproex (DEPAKOTE) 500 MG DR tablet TAKE 1 TABLET BY MOUTH THREE TIMES DAILY WITH MEALS. 90 tablet 12 03/23/2019 at 0700  . fenofibrate (TRICOR) 145 MG tablet TAKE 1 TABLET BY MOUTH ONCE DAILY FOR LIPIDS. 30 tablet 3 Past Week at Unknown time  . fluticasone (FLONASE) 50 MCG/ACT nasal spray Place 2 sprays into both nostrils daily. Use PRN 3 g 12 Past Week at Unknown time  . guaiFENesin (MUCINEX) 600 MG 12 hr tablet Take 1 tablet (600 mg total) by mouth 2 (two) times daily as needed. 180 tablet 0 Past Week at Unknown time  . loratadine (CLARITIN) 10 MG tablet TAKE 1 TABLET  BY MOUTH ONCE DAILY. 30 tablet 0 Past Week at Unknown time  . meclizine (ANTIVERT) 25 MG tablet Take 25 mg by mouth 3 (three) times daily as needed for dizziness.   03/23/2019 at 0700  . Multiple Vitamin (MULTIVITAMIN WITH MINERALS) TABS tablet Take 1 tablet by mouth daily.   Past Week at Unknown time  . risperiDONE (RISPERDAL) 1 MG tablet    Past Week at Unknown time  . risperiDONE (RISPERDAL) 2 MG tablet    Past Week at Unknown time     No Known Allergies   Past Medical History:  Diagnosis Date  . Epilepsy ([place])   . Epileptic seizures ([place])   . Hyperlipidemia   . Mental retardation   . OCD (obsessive compulsive disorder)    water drinking  . OCD (obsessive compulsive disorder)   . Severe mitral regurgitation    a. 02/2016 Echo: EF 55-60%, no rwma, MVP of posterior leaflet w/ an anteriorly directed MR - severe. Sev dil LA; b. TTE 8/18: EF of 55-60%, normal wall motion, normal LV diastolic function parameters, severe mitral prolapse involving the anterior leaflet and posterior leaflet with severe regurgitation directed anteriorly, severely dilated left atrium, unable to estimate the PASP  . Systolic murmur     Review of systems:  Otherwise negative.    Physical Exam  Gen: Alert, oriented. Appears stated age.  HEENT: [place]/AT. PERRLA. Lungs: CTA, no wheezes. CV: RR nl S1, S2. Abd: soft, benign, no masses.  BS+ Ext: No edema. Pulses 2+    Planned procedures: Proceed with EGD and colonoscopy.  The patient understands the nature of the planned procedure, indications, risks, alternatives and potential complications including but not limited to bleeding, infection, perforation, damage to internal organs and possible oversedation/side effects from anesthesia. The patient agrees and gives consent to proceed.  Please refer to procedure notes for findings, recommendations and patient disposition/instructions.     Raymond K. Raymond [name], M.D. Gastroenterology 03/23/2019  10:20 AM

## 2019-03-24 ENCOUNTER — Encounter: Payer: Self-pay | Admitting: Internal Medicine

## 2019-03-24 LAB — SURGICAL PATHOLOGY

## 2019-04-13 DIAGNOSIS — F919 Conduct disorder, unspecified: Secondary | ICD-10-CM | POA: Diagnosis not present

## 2019-05-14 DIAGNOSIS — Z23 Encounter for immunization: Secondary | ICD-10-CM | POA: Diagnosis not present

## 2019-09-18 ENCOUNTER — Other Ambulatory Visit (HOSPITAL_COMMUNITY): Payer: Self-pay | Admitting: Neurology

## 2019-09-18 ENCOUNTER — Other Ambulatory Visit: Payer: Self-pay | Admitting: Neurology

## 2019-09-18 DIAGNOSIS — G40909 Epilepsy, unspecified, not intractable, without status epilepticus: Secondary | ICD-10-CM

## 2019-09-23 ENCOUNTER — Other Ambulatory Visit: Payer: Self-pay | Admitting: Gastroenterology

## 2019-09-23 DIAGNOSIS — R1013 Epigastric pain: Secondary | ICD-10-CM

## 2019-09-23 DIAGNOSIS — R1084 Generalized abdominal pain: Secondary | ICD-10-CM

## 2019-09-27 ENCOUNTER — Ambulatory Visit: Payer: Medicare Other

## 2019-10-07 ENCOUNTER — Ambulatory Visit
Admission: RE | Admit: 2019-10-07 | Discharge: 2019-10-07 | Disposition: A | Discharge status: Home / Self Care | Payer: Medicare Other | Source: Ambulatory Visit | Attending: Gastroenterology | Admitting: Gastroenterology

## 2019-10-07 ENCOUNTER — Other Ambulatory Visit: Payer: Self-pay

## 2019-10-07 DIAGNOSIS — R1013 Epigastric pain: Secondary | ICD-10-CM | POA: Diagnosis present

## 2019-10-07 DIAGNOSIS — R1084 Generalized abdominal pain: Secondary | ICD-10-CM | POA: Insufficient documentation

## 2019-10-07 MED ORDER — IOHEXOL 300 MG/ML  SOLN
100.0000 mL | Freq: Once | INTRAMUSCULAR | Status: AC | PRN
  Administered 2019-10-07: 100 mL via INTRAVENOUS

## 2019-10-08 ENCOUNTER — Ambulatory Visit
Admission: RE | Admit: 2019-10-08 | Discharge: 2019-10-08 | Disposition: A | Discharge status: Home / Self Care | Payer: Medicare Other | Source: Ambulatory Visit | Attending: Neurology | Admitting: Neurology

## 2019-10-08 ENCOUNTER — Other Ambulatory Visit: Payer: Self-pay

## 2019-10-08 DIAGNOSIS — G40909 Epilepsy, unspecified, not intractable, without status epilepticus: Secondary | ICD-10-CM | POA: Insufficient documentation

## 2019-10-08 MED ORDER — GADOBUTROL 1 MMOL/ML IV SOLN
7.0000 mL | Freq: Once | INTRAVENOUS | Status: AC | PRN
  Administered 2019-10-08: 7 mL via INTRAVENOUS

## 2019-11-03 ENCOUNTER — Ambulatory Visit: Payer: Medicare Other | Admitting: Physical Therapy

## 2019-11-05 ENCOUNTER — Encounter: Payer: Medicare Other | Admitting: Physical Therapy

## 2019-11-10 ENCOUNTER — Encounter: Payer: Medicare Other | Admitting: Physical Therapy

## 2019-11-12 ENCOUNTER — Encounter: Payer: Medicare Other | Admitting: Physical Therapy

## 2019-12-02 ENCOUNTER — Ambulatory Visit: Payer: Medicare Other | Attending: Neurology | Admitting: Physical Therapy

## 2019-12-02 ENCOUNTER — Encounter: Payer: Self-pay | Admitting: Physical Therapy

## 2019-12-02 ENCOUNTER — Other Ambulatory Visit: Payer: Self-pay

## 2019-12-02 DIAGNOSIS — R279 Unspecified lack of coordination: Secondary | ICD-10-CM | POA: Diagnosis present

## 2019-12-02 DIAGNOSIS — R2681 Unsteadiness on feet: Secondary | ICD-10-CM | POA: Insufficient documentation

## 2019-12-02 DIAGNOSIS — R296 Repeated falls: Secondary | ICD-10-CM | POA: Diagnosis present

## 2019-12-02 DIAGNOSIS — R262 Difficulty in walking, not elsewhere classified: Secondary | ICD-10-CM | POA: Diagnosis present

## 2019-12-02 NOTE — Therapy (Signed)
[place] [facility] [address] [place], [address] Phone: [phone]   Fax:  [phone]  Physical Therapy Evaluation  Patient Details  Name: Raymond [name] MRN: [id number] Date of Birth: 08-03-1960 Referring Provider (PT): Cristopher Peru, MD   Encounter Date: 12/02/2019  PT End of Session - 12/03/19 1856    Visit Number  1    Number of Visits  24    Date for PT Re-Evaluation  02/24/20    Authorization Type  Medicare reporting period from 12/02/2019    Authorization Time Period  n/a    Authorization - Visit Number  1    Authorization - Number of Visits  10    Progress Note Due on Visit  10    PT Start Time  0945    PT Stop Time  1045    PT Time Calculation (min)  60 min    Equipment Utilized During Treatment  Gait belt    Activity Tolerance  Patient tolerated treatment well;No increased pain    Behavior During Therapy  [facility] for tasks assessed/performed;Flat affect       Past Medical History:  Diagnosis Date  . Epilepsy (HCC)   . Epileptic seizures (HCC)   . Hyperlipidemia   . Mental retardation   . OCD (obsessive compulsive disorder)    water drinking  . OCD (obsessive compulsive disorder)   . Severe mitral regurgitation    a. 02/2016 Echo: EF 55-60%, no rwma, MVP of posterior leaflet w/ an anteriorly directed MR - severe. Sev dil LA; b. TTE 8/18: EF of 55-60%, normal wall motion, normal LV diastolic function parameters, severe mitral prolapse involving the anterior leaflet and posterior leaflet with severe regurgitation directed anteriorly, severely dilated left atrium, unable to estimate the PASP  . Systolic murmur     Past Surgical History:  Procedure Laterality Date  . COLONOSCOPY WITH PROPOFOL N/A 03/23/2019   Procedure: COLONOSCOPY WITH PROPOFOL;  Surgeon: Toledo, Boykin Nearing, MD;  Location: [facility] ENDOSCOPY;  Service: Gastroenterology;  Laterality: N/A;  . ESOPHAGOGASTRODUODENOSCOPY (EGD) WITH PROPOFOL  N/A 03/23/2019   Procedure: ESOPHAGOGASTRODUODENOSCOPY (EGD) WITH PROPOFOL;  Surgeon: Toledo, Boykin Nearing, MD;  Location: [facility] ENDOSCOPY;  Service: Gastroenterology;  Laterality: N/A;  NEEDS A RAPID    There were no vitals filed for this visit.   Subjective Assessment - 12/03/19 1825    Subjective  Patient's caregiver, Raymond [name], is here and provides most of history. Patient answers questions or attempts intermittently with cuing and required rousing to stay awake. Patient lives at a group home with 10 other people.  States he has days or 1-2 days at a time where he gets imbalance and leans to the right in sitting and standing until he falls. He has told Raymond [name] that today is a bad day and he is unstable in standing and somewhat in sitting. He has had this problem for years and has had PT for it previously. It may have been getting worse lately. Started to get more frequent in the last year. Denies dizziness (spinning) but that he just can't balance. Calls it "the weebles."  He has about one day every week or two weeks where he has a bad day. He falls about every 6 weeks. He takes meclizine when he feels "weebly" and this helps. 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 He has meclizine there but currently unable to give it to him unless guardian signs off. On a "good day" he ambulates independent and is very self sufficient with no assistive device. There is always someone in the building with him, but he is able to be independent in a room alone. Able to complete ADLS and IADLs. Always needs help with IADLs. On a "bad day" he requires physical support (no gait belt) to get around, and needs constant supervision, due to leaning. He  usually stays in the bed until his medicine is in him. Needs help with all activity and mobility at that point. There are no known triggers for the imbalance. He has had PT before for the imbalance, and Raymond [name] thinks it helped for a little while but it also depends on how willing they are to do the exercises at home. He will need to be able to complete a HEP on his own. His caregiver has 10 people she cares for at the group home. He can read a little. He also has a seizure disorder that is mostly controlled with medications. There are no known triggers. Raymond [name] has actually not witnessed one yet and states they happen at night sometimes or very rarely at [employer]. He does not have a rescue medication. A caregiver will stay with him at each PT appointment    Pertinent History  Patient is a 59 y.o. male who presents to outpatient physical therapy with a referral for medical diagnosis imbalance. This patient's chief complaints consist of episodes of imbalance affecting him in sitting and standing positions leading to the following functional deficits: is dependent on assistance to do all ADLs, IADLs and ambulation (is independent with ADLs and ambulation at baseline). Usually must stay in bed until imbalance improves. Relevant past medical history and comorbidities include intellectual disability (lives in group home), double heart murmur (medically managed, severe mitral regurgitation), epilepsy (triggers unknown, has had very few since at this group home, usually at night, last was 2 months ago, caregiver has not seen them), reflux.  Patient denies hx of cancer, stroke, lung problems, major cardiac events (besides heart murmurs), diabetes,changes in bowel or bladder problems, new onset stumbling or dropping things apart from described below. Caregiver reports some unexplained weight loss a little that has improved with adding ensure and is monitored by physician.    Limitations  Sitting;Standing;Walking;Lifting;House hold  activities;Other (comment)   is dependent on assistance to do all ADLs, IADLs and ambulation (is independent with ADLs and ambulation at baseline).   Currently in Pain?  No/denies   Pain is not a primary problem      [facility] PT Assessment - 12/03/19 0001      Assessment   Medical Diagnosis  imbalance    Referring Provider (PT)  Cristopher Peru, MD    Onset Date/Surgical Date  12/02/18   chronic, but started worsening again a year ago   Hand Dominance  Right    Next MD Visit  unsure    Prior Therapy  has had some PT for this issue in the past with  favorable results      Precautions   Precautions  Fall   double heart murmur     Restrictions   Weight Bearing Restrictions  No      Balance Screen  Has the patient fallen in the past 6 months  Yes    How many times?  --   falls about every 6 weeks   Has the patient had a decrease in activity level because of a fear of falling?   Yes    Is the patient reluctant to leave their home because of a fear of falling?   No      Home Environment   Living Environment  Group home    Living Arrangements  Other (Comment)   caregiver 24/7 (for 10 residents)   Type of [place] entrance   hand rails both sides, cannot reach both at once   Home Layout  One level    [place] bars - tub/shower;Grab bars - toilet;Shower seat   has a walk in shower     Prior Function   Level of Independence  Independent with basic ADLs    Vocation  Other (comment);On disability   he participates in an adult care special needs program M-F   Vocation Requirements  sit in a chair    Leisure  [place], go to the movies, watch movies      Cognition   Overall Cognitive Status  History of cognitive impairments - at baseline    Behaviors  Other (comment)   sleepy     Berg Balance Test   Sit to Stand  Able to stand  independently using hands    Standing Unsupported  Able to stand 2 minutes with supervision   increased  sway, takes little steps occasuionally   Sitting with Back Unsupported but Feet Supported on Floor or Stool  Able to sit 2 minutes under supervision    Stand to Sit  Controls descent by using hands    Transfers  Able to transfer safely, definite need of hands    Standing Unsupported with Eyes Closed  Able to stand 10 seconds with supervision    Standing Unsupported with Feet Together  Needs help to attain position and unable to hold for 15 seconds    From Standing, Reach Forward with Outstretched Arm  Loses balance while trying/requires external support    From Standing Position, Pick up Object from Floor  Able to pick up shoe, needs supervision    From Standing Position, Turn to Look Behind Over each Shoulder  Needs supervision when turning    Turn 360 Degrees  Needs close supervision or verbal cueing    Standing Unsupported, Alternately Place Feet on Step/Stool  Needs assistance to keep from falling or unable to try    Standing Unsupported, One Foot in [employer] balance while stepping or standing    Standing on One Leg  Unable to try or needs assist to prevent fall    Total Score  23    Berg comment:  <36 = High risk for falls (close to 100%)         OBJECTIVE  OBSERVATION/INSPECTION Posture: forward head, rounded shoulders, slumped in sitting.  . Tremor: none . Muscle bulk: generally overall mildly decreased consistent with lack of regular adequate exercise or physical activity.  . Transfers: sit <> stand with SBA- min A due to sway and imbalance.  . Gait: veers off course and staggers occasionally while ambulating in clinic with no AD. CGA - min A to prevent falls.    NEUROLOGICAL  Upper Motor Neuron Screen Hoffman's, and Clonus (ankle) negative bilaterally Dermatomes .  L2-S2 appears equal and intact to light touch. Myotomes . L2-S2 appears intact Deep Tendon Reflexes R/L  . 2+/2+ Quadriceps reflex (L4) . 0+/0+ Achilles reflex (S1)  PERIPHERAL JOINT MOTION (in  degrees)  Active Range of Motion (AROM) B BLE appears WFL for basic mobility  MUSCLE PERFORMANCE (MMT): *Indicates pain 12/02/2019 Date Date  Joint/Motion R/L R/L R/L  Hip     Flexion (knee flex) 4/4 / /  Flexion (knee ext) / / /  Extension / / /  Abduction / / /  Knee     Extension 4+/4+ / /  Flexoin 4/4 / /  Ankle/Foot     Dorsiflexion (knee ext) 4/4 / /  [place]ison 4/3 / /  Great toe extension 4/3 / /  Comments:  Some hip motions deferred. Ankle PF at least 4/4.   FUNCTIONAL/BALANCE TESTS:  Five Time Sit to Stand (5TSTS): 17 seconds from 18.5 inch plinth with no UE support. SBA for safety.   BERG Balance Scale: 23/56 <36 = High risk for falls (close to 100%)   EDUCATION/COGNITION: Patient is sleepy and has cognitive impairments. Caregiver is alert and oriented x 4.   Objective measurements completed on examination: See above findings.    TREATMENT:   Therapeutic exercise: to centralize symptoms and improve ROM, strength, muscular endurance, and activity tolerance required for successful completion of functional activities.  - seated marching with no UE support x 10 each side - Education on diagnosis, prognosis, POC, anatomy and physiology of current condition.  - Education on HEP including handout   HOME EXERCISE PROGRAM Access Code: [email or web address] URL: [email or web address] Date: 12/02/2019 Prepared by: Norton BlizzardSara Sheldon Sem  Exercises Seated March - 1 x daily - 20 reps  Patient response to treatment:  Pt tolerated treatment well. Pt was able to complete all exercises with minimal to no lasting increase in pain or discomfort. Pt required multimodal cuing for proper technique and to facilitate improved neuromuscular control, strength, range of motion, and functional ability resulting in improved performance and form.     PT Education - 12/03/19 1905    Education Details  Exercise purpose/form. Self management techniques. Education on diagnosis, prognosis, POC,  anatomy and physiology of current condition Education on HEP including handout    Person(s) Educated  Patient;Caregiver(s)    Methods  Explanation;Demonstration;Tactile cues;Verbal cues;Handout    Comprehension  Verbalized understanding;Returned demonstration;Verbal cues required;Tactile cues required;Need further instruction       PT Short Term Goals - 12/03/19 1855      PT SHORT TERM GOAL #1   Title  Be independent with initial home exercise program with minimal assistance for self-management of symptoms.    Baseline  Initial HEP provided at IE (12/02/2019);        PT Long Term Goals - 12/03/19 1857      PT LONG TERM GOAL #1   Title  Be independent at group home with a long-term home exercise program for self-management of symptoms.    Baseline  initial HEP Provied at IE (12/02/2019);    Time  12    Period  Weeks    Status  New    Target Date  --   TARGET DATE FOR ALL LONG TERM GOALS: 02/24/2020     PT LONG TERM GOAL #2   Title  Improve 5 Time Sit To Stand Test to equal or less than 11 seconds to demonstrate improved functional strenght/power/coordination/balance for transfers.    Baseline  17 seconds from 18.5 inch plinth with no  UE support. SBA for safety (12/02/2019);    Time  12    Period  Weeks    Status  New      PT LONG TERM GOAL #3   Title  Improve BERG Balance Scale score to equal or greater than 52/56 to demonstrate lower (> 25%) fall risk.    Baseline  23/56 <36 = High risk for falls (12/02/2019);    Time  12    Period  Weeks    Status  New      PT LONG TERM GOAL #4   Title  Patient/caregiver will report equal or less than one episode of imbalance per month to improve independence and decrease risk of falls.    Baseline  one or two days every week to two weeks (12/02/2019);    Time  12    Period  Weeks    Status  New      PT LONG TERM GOAL #5   Title  Complete community, work and/or recreational activities without limitation due to current condition.    Baseline   limiting ability to complete his usual basic ADLs, household and community mobility, transfers, sitting, work/education social participation without difficulty (12/02/2019);    Time  12    Period  Weeks    Status  New             Plan - 12/03/19 1851    Clinical Impression Statement  Patient is a 59 y.o. male referred to outpatient physical therapy with a medical diagnosis of imbalance who presents with signs and symptoms consistent with worsening chronic intermittent unsteadiness on feet and dysequilibrium with high fall risk/history of falls. Patient presents with significant balance, coordination, cognitive, gait pattern, muscle performance (strength/power/endurance), and posture impairments that are limiting ability to complete his usual basic ADLs, household and community mobility, transfers, sitting, work/education social participation without difficulty. Patient will benefit from skilled physical therapy intervention to address current body structure impairments and activity limitations to improve function and work towards goals set in current POC in order to return to prior level of function or maximal functional improvement.    Personal Factors and Comorbidities  Age;Comorbidity 3+;Behavior Pattern;Education;Past/Current Experience;Social Background;Other;Transportation;Time since onset of injury/illness/exacerbation   intellectual disability   Comorbidities  intellectual disability (lives in group home), double heart murmur (medically managed, severe mitral regurgitation), epilepsy (triggers unknown, has had very few since at this group home, usually at night, last was 2 months ago, caregiver has not seen them), reflux.    Examination-Activity Limitations  Bed Mobility;Bathing;Hygiene/Grooming;Squat;Stairs;Lift;Bend;Locomotion Level;Stand;Toileting;Reach Overhead;Caring for Others;Carry;Transfers;Sit;Dressing;Other   is dependent on assistance to do all ADLs, IADLs and ambulation during  episodes of imbalance (is independent with ADLs and ambulation at baseline).   Examination-Participation Restrictions  Community Activity;Interpersonal Relationship;School;Other   work Museum/gallery curator  Evolving/Moderate complexity    Clinical Decision Making  Moderate    Rehab Potential  Fair    PT Frequency  2x / week    PT Duration  12 weeks    PT Treatment/Interventions  ADLs/Self Care Home Management;Cryotherapy;Gait training;Stair training;Functional mobility training;Therapeutic activities;Therapeutic exercise;Balance training;Neuromuscular re-education;Cognitive remediation;Patient/family education;Manual techniques;Passive range of motion;Vestibular;Visual/perceptual remediation/compensation    PT Next Visit Plan  update HEP, balance training    PT Home Exercise Plan  [employer] Access Code: [email or web address]    Recommended Other Services  Continue to work with medical team    Consulted and Agree with Plan of Care  Patient;Other (Comment)   Caregiver Raymond [name]  Patient will benefit from skilled therapeutic intervention in order to improve the following deficits and impairments:  Abnormal gait, Decreased cognition, Decreased knowledge of use of DME, Dizziness, Improper body mechanics, Postural dysfunction, Decreased mobility, Decreased coordination, Decreased activity tolerance, Decreased strength, Decreased endurance, Impaired perceived functional ability, Difficulty walking, Decreased safety awareness, Decreased balance  Visit Diagnosis: Unsteadiness on feet  Difficulty in walking, not elsewhere classified  Unspecified lack of coordination  Repeated falls     Problem List Patient Active Problem List   Diagnosis Date Noted  . Nose fracture 04/09/2016  . Epileptic seizures (HCC) 01/12/2015  . Intellectual disability   . OCD (obsessive compulsive disorder)   . Hyperlipidemia   . Epilepsy (HCC)   . Systolic murmur    Luretha Murphy. Ilsa Iha, PT, DPT 12/03/19,  7:05 PM  [place] [facility] PHYSICAL AND SPORTS MEDICINE [address] [place], [address] Phone: [phone]   Fax:  [phone]  Name: HRIDHAAN YOHN MRN: [id number] Date of Birth: 1961/01/11

## 2019-12-08 ENCOUNTER — Encounter: Payer: Self-pay | Admitting: Physical Therapy

## 2019-12-08 ENCOUNTER — Ambulatory Visit: Payer: Medicare Other | Admitting: Physical Therapy

## 2019-12-08 ENCOUNTER — Other Ambulatory Visit: Payer: Self-pay

## 2019-12-08 DIAGNOSIS — R262 Difficulty in walking, not elsewhere classified: Secondary | ICD-10-CM

## 2019-12-08 DIAGNOSIS — R296 Repeated falls: Secondary | ICD-10-CM

## 2019-12-08 DIAGNOSIS — R2681 Unsteadiness on feet: Secondary | ICD-10-CM | POA: Diagnosis not present

## 2019-12-08 DIAGNOSIS — R279 Unspecified lack of coordination: Secondary | ICD-10-CM

## 2019-12-08 NOTE — Therapy (Signed)
[facility] [address] [place], [address] Phone: [phone]   Fax:  [phone]  Physical Therapy Treatment  Patient Details  Name: Raymond [name] MRN: [id number] Date of Birth: [birth year] Referring Provider (PT): Cristopher Peru, MD   Encounter Date: 12/08/2019  PT End of Session - 12/08/19 1602    Visit Number  2    Number of Visits  24    Date for PT Re-Evaluation  02/24/20    Authorization Type  Medicare reporting period from 12/02/2019    Authorization Time Period  n/a    Authorization - Visit Number  2    Authorization - Number of Visits  10    Progress Note Due on Visit  10    PT Start Time  0905    PT Stop Time  0945    PT Time Calculation (min)  40 min    Equipment Utilized During Treatment  Gait belt    Activity Tolerance  Patient tolerated treatment well;No increased pain    Behavior During Therapy  [facility] for tasks assessed/performed;Flat affect       Past Medical History:  Diagnosis Date  . Epilepsy (HCC)   . Epileptic seizures (HCC)   . Hyperlipidemia   . Mental retardation   . OCD (obsessive compulsive disorder)    water drinking  . OCD (obsessive compulsive disorder)   . Severe mitral regurgitation    a. 02/2016 Echo: EF 55-60%, no rwma, MVP of posterior leaflet w/ an anteriorly directed MR - severe. Sev dil LA; b. TTE 8/18: EF of 55-60%, normal wall motion, normal LV diastolic function parameters, severe mitral prolapse involving the anterior leaflet and posterior leaflet with severe regurgitation directed anteriorly, severely dilated left atrium, unable to estimate the PASP  . Systolic murmur     Past Surgical History:  Procedure Laterality Date  . COLONOSCOPY WITH PROPOFOL N/A 03/23/2019   Procedure: COLONOSCOPY WITH PROPOFOL;  Surgeon: Toledo, Boykin Nearing, MD;  Location: [facility] ENDOSCOPY;  Service: Gastroenterology;  Laterality: N/A;  . ESOPHAGOGASTRODUODENOSCOPY (EGD) WITH PROPOFOL  N/A 03/23/2019   Procedure: ESOPHAGOGASTRODUODENOSCOPY (EGD) WITH PROPOFOL;  Surgeon: Toledo, Boykin Nearing, MD;  Location: [facility] ENDOSCOPY;  Service: Gastroenterology;  Laterality: N/A;  NEEDS A RAPID    There were no vitals filed for this visit.  Subjective Assessment - 12/08/19 1556    Subjective  Patient arrives with caregiver, [name]. He reports some discomfort at his chest and [name] states he has problems with gastric reflux. [name] reports he has a stronger case of "the weebles" today and she didn't know until right before they left but he said he had been feeling that way since he got up. Given meclezline prior to leaving home. No falls since last treatment session. Has been doing HEP occasionally. No pian or problems following last session.    Pertinent History  Patient is a 59 y.o. male who presents to outpatient physical therapy with a referral for medical diagnosis imbalance. This patient's chief complaints consist of episodes of imbalance affecting him in sitting and standing positions leading to the following functional deficits: is dependent on assistance to do all ADLs, IADLs and ambulation (is independent with ADLs and ambulation at baseline). Usually must stay in bed until imbalance improves. Relevant past medical history and comorbidities include intellectual disability (lives in group home), double heart murmur (medically managed, severe mitral regurgitation), epilepsy (triggers unknown, has had very few since at this group home, usually at night,  last was 2 months ago, caregiver has not seen them), reflux.  Patient denies hx of cancer, stroke, lung problems, major cardiac events (besides heart murmurs), diabetes,changes in bowel or bladder problems, new onset stumbling or dropping things apart from described below. Caregiver reports some unexplained weight loss a little that has improved with adding ensure and is monitored by physician.    Limitations  Sitting;Standing;Walking;Lifting;House hold  activities;Other (comment)   is dependent on assistance to do all ADLs, IADLs and ambulation (is independent with ADLs and ambulation at baseline).   Currently in Pain?  Yes    Pain Score  --   uncomfortable   Pain Location  Chest    Pain Descriptors / Indicators  --   patient points to chest/anterior neck region      TREATMENT:   Neuromuscular Re-education: to improve, balance, postural strength, muscle activation patterns, and stabilization strength required for functional activities:  Seated exercises to improve trunk control and equilibrium (SBA - min A to prevent falls, pt leaning consistently towards right):  - seated marching with no UE support x 10 each side to improve trunk control.  - attempted Gaze Stabilization VOR 1X activity using target of pt's favorite letter (T) but pt unable to keep eyes on target and move head. Seemed unable to process cuing and was distractible. Noted for eye abduction in both R or L eyes at times.  - seated ball toss with bias towards catching to the left to encourage reaching left x 10.  - seated ball bounces off knees, pt confused by instructions to bounce and catch off knees and gets close intermittently but more often throws ball up and catches it. Attempted to target mid lap, then each knee with hips slightly abducted to encourage trunk movement and reaching towards left side. Mixed results.   Standing exercises to improve balance and equilibrium (CGA - min A to prevent falls, pt leaning trunk consistently towards R and shifting weight to left hip, patient counting out loud when able): - standing ball passes using small pink theraball to retreive from clinician on one side of body and return to clinician on other side of body. x10 each direction with bias to reach to the left due to right sided lean.  - standing toe single/double toe taps on 9 inch step, 2x10 R side tapping, 1x10 L side tapping.  - side stepping 2x10, 4 feet each direction reaching  for target on each side.  - standing ball roll back and forth on counter, to encourage weight shift to the left, x10 - ambulation x 150 feet out to car including up and down ADA compliant ramp.  HOME EXERCISE PROGRAM Access Code: [email or web address] URL: [email or web address] Date: 12/02/2019 Prepared by: Norton Blizzard  Exercises Seated March - 1 x daily - 20 reps   PT Education - 12/08/19 1602    Education Details  Exercise purpose/form. Self management techniques.    Person(s) Educated  Patient;Caregiver(s)    Methods  Explanation;Demonstration;Tactile cues;Verbal cues    Comprehension  Verbalized understanding;Returned demonstration;Verbal cues required;Tactile cues required;Need further instruction       PT Short Term Goals - 12/03/19 1855      PT SHORT TERM GOAL #1   Title  Be independent with initial home exercise program with minimal assistance for self-management of symptoms.    Baseline  Initial HEP provided at IE (12/02/2019);        PT Long Term Goals - 12/03/19 1857      PT LONG  TERM GOAL #1   Title  Be independent at group home with a long-term home exercise program for self-management of symptoms.    Baseline  initial HEP Provied at IE (12/02/2019);    Time  12    Period  Weeks    Status  New    Target Date  --   TARGET DATE FOR ALL LONG TERM GOALS: 02/24/2020     PT LONG TERM GOAL #2   Title  Improve 5 Time Sit To Stand Test to equal or less than 11 seconds to demonstrate improved functional strenght/power/coordination/balance for transfers.    Baseline  17 seconds from 18.5 inch plinth with no UE support. SBA for safety (12/02/2019);    Time  12    Period  Weeks    Status  New      PT LONG TERM GOAL #3   Title  Improve BERG Balance Scale score to equal or greater than 52/56 to demonstrate lower (> 25%) fall risk.    Baseline  23/56 <36 = High risk for falls (12/02/2019);    Time  12    Period  Weeks    Status  New      PT LONG TERM GOAL #4   Title   Patient/caregiver will report equal or less than one episode of imbalance per month to improve independence and decrease risk of falls.    Baseline  one or two days every week to two weeks (12/02/2019);    Time  12    Period  Weeks    Status  New      PT LONG TERM GOAL #5   Title  Complete community, work and/or recreational activities without limitation due to current condition.    Baseline  limiting ability to complete his usual basic ADLs, household and community mobility, transfers, sitting, work/education social participation without difficulty (12/02/2019);    Time  12    Period  Weeks    Status  New            Plan - 12/08/19 1616    Clinical Impression Statement  Patient tolerated treatment well with no complaint of pain except when asked at start of session when he said he had some chest discomfort that seemed consistent with known history of acid reflux. Patient showed disturbance in sense of equilibrium and consistently leaned to the right in sitting and standing, throwing him off balance and requring CGA  - minA to prevent falls in standing. Patient required frequent redirection and was distracted by caregiver being present at times. Often non-verbal with clinician when caregiver present, preferring to let her speak for him. Patient would benefit from continued management of limiting condition by skilled physical therapist to address remaining impairments and functional limitations to work towards stated goals and return to PLOF or maximal functional independence.    Personal Factors and Comorbidities  Age;Comorbidity 3+;Behavior Pattern;Education;Past/Current Experience;Social Background;Other;Transportation;Time since onset of injury/illness/exacerbation   intellectual disability   Comorbidities  intellectual disability (lives in group home), double heart murmur (medically managed, severe mitral regurgitation), epilepsy (triggers unknown, has had very few since at this group home,  usually at night, last was 2 months ago, caregiver has not seen them), reflux.    Examination-Activity Limitations  Bed Mobility;Bathing;Hygiene/Grooming;Squat;Stairs;Lift;Bend;Locomotion Level;Stand;Toileting;Reach Overhead;Caring for Others;Carry;Transfers;Sit;Dressing;Other   is dependent on assistance to do all ADLs, IADLs and ambulation during episodes of imbalance (is independent with ADLs and ambulation at baseline).   Examination-Participation Restrictions  Community Activity;Interpersonal Relationship;School;Other  work Engineer, maintenance (IT)  Evolving/Moderate complexity    Rehab Potential  Fair    PT Frequency  2x / week    PT Duration  12 weeks    PT Treatment/Interventions  ADLs/Self Care Home Management;Cryotherapy;Gait training;Stair training;Functional mobility training;Therapeutic activities;Therapeutic exercise;Balance training;Neuromuscular re-education;Cognitive remediation;Patient/family education;Manual techniques;Passive range of motion;Vestibular;Visual/perceptual remediation/compensation    PT Next Visit Plan  update HEP, balance training    PT Home Exercise Plan  [employer] Access Code: [email or web address]    Consulted and Agree with Plan of Care  Patient;Other (Comment)   Caregiver Quita Skye      Patient will benefit from skilled therapeutic intervention in order to improve the following deficits and impairments:  Abnormal gait, Decreased cognition, Decreased knowledge of use of DME, Dizziness, Improper body mechanics, Postural dysfunction, Decreased mobility, Decreased coordination, Decreased activity tolerance, Decreased strength, Decreased endurance, Impaired perceived functional ability, Difficulty walking, Decreased safety awareness, Decreased balance  Visit Diagnosis: Unsteadiness on feet  Difficulty in walking, not elsewhere classified  Unspecified lack of coordination  Repeated falls     Problem List Patient Active Problem List   Diagnosis  Date Noted  . Nose fracture 04/09/2016  . Epileptic seizures ([place]) 01/12/2015  . Intellectual disability   . OCD (obsessive compulsive disorder)   . Hyperlipidemia   . Epilepsy ([place])   . Systolic murmur     Everlean Alstrom. Graylon Good, PT, DPT 12/08/19, 4:17 PM  [place] [facility] [address], [place], [address] Phone: [phone]   Fax:  [phone]  Name: Raymond [name] MRN: [id number] Date of Birth: 07-Mar-1961

## 2019-12-10 ENCOUNTER — Ambulatory Visit: Payer: Medicare Other | Admitting: Physical Therapy

## 2019-12-10 ENCOUNTER — Other Ambulatory Visit: Payer: Self-pay

## 2019-12-10 DIAGNOSIS — R296 Repeated falls: Secondary | ICD-10-CM

## 2019-12-10 DIAGNOSIS — R2681 Unsteadiness on feet: Secondary | ICD-10-CM | POA: Diagnosis not present

## 2019-12-10 DIAGNOSIS — R262 Difficulty in walking, not elsewhere classified: Secondary | ICD-10-CM

## 2019-12-10 DIAGNOSIS — R279 Unspecified lack of coordination: Secondary | ICD-10-CM

## 2019-12-10 NOTE — Therapy (Signed)
[facility] [address], [place], [address] Phone: [phone]   Fax:  [phone]  Physical Therapy Treatment  Patient Details  Name: Raymond [name] MRN: [id number] Date of Birth: May 06, 1961 Referring Provider (PT): Cristopher Peru, MD   Encounter Date: 12/10/2019  PT End of Session - 12/10/19 1101    Visit Number  3    Number of Visits  24    Date for PT Re-Evaluation  02/24/20    Authorization Type  Medicare reporting period from 12/02/2019    Authorization Time Period  n/a    Authorization - Visit Number  3    Authorization - Number of Visits  10    Progress Note Due on Visit  10    PT Start Time  0905    PT Stop Time  0945    PT Time Calculation (min)  40 min    Equipment Utilized During Treatment  Gait belt    Activity Tolerance  Patient tolerated treatment well;No increased pain    Behavior During Therapy  [facility] for tasks assessed/performed;Flat affect       Past Medical History:  Diagnosis Date  . Epilepsy (HCC)   . Epileptic seizures (HCC)   . Hyperlipidemia   . Mental retardation   . OCD (obsessive compulsive disorder)    water drinking  . OCD (obsessive compulsive disorder)   . Severe mitral regurgitation    a. 02/2016 Echo: EF 55-60%, no rwma, MVP of posterior leaflet w/ an anteriorly directed MR - severe. Sev dil LA; b. TTE 8/18: EF of 55-60%, normal wall motion, normal LV diastolic function parameters, severe mitral prolapse involving the anterior leaflet and posterior leaflet with severe regurgitation directed anteriorly, severely dilated left atrium, unable to estimate the PASP  . Systolic murmur     Past Surgical History:  Procedure Laterality Date  . COLONOSCOPY WITH PROPOFOL N/A 03/23/2019   Procedure: COLONOSCOPY WITH PROPOFOL;  Surgeon: Toledo, Boykin Nearing, MD;  Location: [facility] ENDOSCOPY;  Service: Gastroenterology;  Laterality: N/A;  . ESOPHAGOGASTRODUODENOSCOPY (EGD) WITH PROPOFOL  N/A 03/23/2019   Procedure: ESOPHAGOGASTRODUODENOSCOPY (EGD) WITH PROPOFOL;  Surgeon: Toledo, Boykin Nearing, MD;  Location: [facility] ENDOSCOPY;  Service: Gastroenterology;  Laterality: N/A;  NEEDS A RAPID    There were no vitals filed for this visit.  Subjective Assessment - 12/10/19 0926    Subjective  Patient arrives with caregiver who drops him off at the door today. Patient reports no pain upon arrival and no falls since last treatment session. He denies having "the weebles" today.    Pertinent History  Patient is a 59 y.o. male who presents to outpatient physical therapy with a referral for medical diagnosis imbalance. This patient's chief complaints consist of episodes of imbalance affecting him in sitting and standing positions leading to the following functional deficits: is dependent on assistance to do all ADLs, IADLs and ambulation (is independent with ADLs and ambulation at baseline). Usually must stay in bed until imbalance improves. Relevant past medical history and comorbidities include intellectual disability (lives in group home), double heart murmur (medically managed, severe mitral regurgitation), epilepsy (triggers unknown, has had very few since at this group home, usually at night, last was 2 months ago, caregiver has not seen them), reflux.  Patient denies hx of cancer, stroke, lung problems, major cardiac events (besides heart murmurs), diabetes,changes in bowel or bladder problems, new onset stumbling or dropping things apart from described below. Caregiver reports some unexplained weight  loss a little that has improved with adding ensure and is monitored by physician.    Limitations  Sitting;Standing;Walking;Lifting;House hold activities;Other (comment)   is dependent on assistance to do all ADLs, IADLs and ambulation (is independent with ADLs and ambulation at baseline).   Currently in Pain?  No/denies       TREATMENT:  Neuromuscular Re-education: to improve, balance, postural  strength, muscle activation patterns, and stabilization strength required for functional activities:  Standing exercises to improve balance and equilibrium (CGA - min A to prevent falls, pt loses balance to the right easily and has difficulty balancing while lifting R LE up, patient counting out loud when able): - cone taps alternating feet x 4 each side. Unable to control R foot when WB on left with loss of balance to the right. Completed 2 more reps with more solid support on right.  - side stepping moving 11 cones from one counter to a high plinth. Attempted to have him walk over airex pad, but too difficult and caused loss of balance.  - standing on airex moving 11 cones from one side of the table to the other in a stack. 2x each way with stacks moved farther apart.  - step up/down from airex pad with self selected lead foot (prefers R up). 3x10 - ball toss on airex throwing small pink theraball ball up and catching it while balancing, x10, 2x20.  - standing ball passes using small pink theraball to retreive from clinician on one side of body and return to clinician on other side of body. x10 each direction while balancing on airex.  - ambulation x 100 feet out to car with mod A to prevent fall when pt tripped on threshold of door.   HOME EXERCISE PROGRAM Access Code: [email or web address] URL: [email or web address] Date: 12/02/2019 Prepared by: Norton Blizzard  Exercises Seated March - 1 x daily - 20 reps    PT Education - 12/10/19 1101    Education Details  Exercise purpose/form    Person(s) Educated  Patient    Methods  Explanation;Demonstration;Tactile cues;Verbal cues    Comprehension  Returned demonstration;Verbal cues required;Tactile cues required;Verbalized understanding;Need further instruction       PT Short Term Goals - 12/03/19 1855      PT SHORT TERM GOAL #1   Title  Be independent with initial home exercise program with minimal assistance for self-management of  symptoms.    Baseline  Initial HEP provided at IE (12/02/2019);        PT Long Term Goals - 12/03/19 1857      PT LONG TERM GOAL #1   Title  Be independent at group home with a long-term home exercise program for self-management of symptoms.    Baseline  initial HEP Provied at IE (12/02/2019);    Time  12    Period  Weeks    Status  New    Target Date  --   TARGET DATE FOR ALL LONG TERM GOALS: 02/24/2020     PT LONG TERM GOAL #2   Title  Improve 5 Time Sit To Stand Test to equal or less than 11 seconds to demonstrate improved functional strenght/power/coordination/balance for transfers.    Baseline  17 seconds from 18.5 inch plinth with no UE support. SBA for safety (12/02/2019);    Time  12    Period  Weeks    Status  New      PT LONG TERM GOAL #3   Title  Improve BERG Balance Scale  score to equal or greater than 52/56 to demonstrate lower (> 25%) fall risk.    Baseline  23/56 <36 = High risk for falls (12/02/2019);    Time  12    Period  Weeks    Status  New      PT LONG TERM GOAL #4   Title  Patient/caregiver will report equal or less than one episode of imbalance per month to improve independence and decrease risk of falls.    Baseline  one or two days every week to two weeks (12/02/2019);    Time  12    Period  Weeks    Status  New      PT LONG TERM GOAL #5   Title  Complete community, work and/or recreational activities without limitation due to current condition.    Baseline  limiting ability to complete his usual basic ADLs, household and community mobility, transfers, sitting, work/education social participation without difficulty (12/02/2019);    Time  12    Period  Weeks    Status  New            Plan - 12/10/19 1108    Clinical Impression Statement  Patient tolerated treatment well and participated well with more verbal communication with clinician while caregiver not present. Although pt denied feeling "the weebles" today he was still quite unsteady during  exercises and required mod A to prevent a fall forward while exiting the building because he tripped on the door threshold. Patient has a very hard time standing on left LE while picking op right foot and consistently leans/loses balance to the right, requiring minA to correct throughout session. Also demonstrates difficulty picking up feet to get on and off airex pad. Has difficulty catching himself using stepping strategy after losing balance. Does reach out with UEs reflexively when he loses balance. Patient would benefit from continued management of limiting condition by skilled physical therapist to address remaining impairments and functional limitations to work towards stated goals and return to PLOF or maximal functional independence.    Personal Factors and Comorbidities  Age;Comorbidity 3+;Behavior Pattern;Education;Past/Current Experience;Social Background;Other;Transportation;Time since onset of injury/illness/exacerbation   intellectual disability   Comorbidities  intellectual disability (lives in group home), double heart murmur (medically managed, severe mitral regurgitation), epilepsy (triggers unknown, has had very few since at this group home, usually at night, last was 2 months ago, caregiver has not seen them), reflux.    Examination-Activity Limitations  Bed Mobility;Bathing;Hygiene/Grooming;Squat;Stairs;Lift;Bend;Locomotion Level;Stand;Toileting;Reach Overhead;Caring for Others;Carry;Transfers;Sit;Dressing;Other   is dependent on assistance to do all ADLs, IADLs and ambulation during episodes of imbalance (is independent with ADLs and ambulation at baseline).   Examination-Participation Restrictions  Community Activity;Interpersonal Relationship;School;Other   work Museum/gallery curator  Evolving/Moderate complexity    Rehab Potential  Fair    PT Frequency  2x / week    PT Duration  12 weeks    PT Treatment/Interventions  ADLs/Self Care Home  Management;Cryotherapy;Gait training;Stair training;Functional mobility training;Therapeutic activities;Therapeutic exercise;Balance training;Neuromuscular re-education;Cognitive remediation;Patient/family education;Manual techniques;Passive range of motion;Vestibular;Visual/perceptual remediation/compensation    PT Next Visit Plan  update HEP, balance training    PT Home Exercise Plan  [facility] Access Code: [email or web address]    Consulted and Agree with Plan of Care  Patient;Other (Comment)   Caregiver [name]      Patient will benefit from skilled therapeutic intervention in order to improve the following deficits and impairments:  Abnormal gait, Decreased cognition, Decreased knowledge of use of DME, Dizziness, Improper body mechanics, Postural  dysfunction, Decreased mobility, Decreased coordination, Decreased activity tolerance, Decreased strength, Decreased endurance, Impaired perceived functional ability, Difficulty walking, Decreased safety awareness, Decreased balance  Visit Diagnosis: Unsteadiness on feet  Difficulty in walking, not elsewhere classified  Unspecified lack of coordination  Repeated falls     Problem List Patient Active Problem List   Diagnosis Date Noted  . Nose fracture 04/09/2016  . Epileptic seizures ([place]) 01/12/2015  . Intellectual disability   . OCD (obsessive compulsive disorder)   . Hyperlipidemia   . Epilepsy ([place])   . Systolic murmur     Everlean Alstrom. Graylon Good, PT, DPT 12/10/19, 11:09 AM  [place] [facility] [address], [place], [address] Phone: [phone]   Fax:  [phone]  Name: Raymond [name] MRN: [id number] Date of Birth: 18-Nov-1960

## 2019-12-15 ENCOUNTER — Encounter: Payer: Self-pay | Admitting: Physical Therapy

## 2019-12-15 ENCOUNTER — Other Ambulatory Visit: Payer: Self-pay

## 2019-12-15 ENCOUNTER — Ambulatory Visit: Payer: Medicare Other | Admitting: Physical Therapy

## 2019-12-15 DIAGNOSIS — R2681 Unsteadiness on feet: Secondary | ICD-10-CM

## 2019-12-15 DIAGNOSIS — R296 Repeated falls: Secondary | ICD-10-CM

## 2019-12-15 DIAGNOSIS — R279 Unspecified lack of coordination: Secondary | ICD-10-CM

## 2019-12-15 DIAGNOSIS — R262 Difficulty in walking, not elsewhere classified: Secondary | ICD-10-CM

## 2019-12-15 NOTE — Therapy (Signed)
[facility] [address] [place], [address] Phone: [phone]   Fax:  [phone]  Physical Therapy Treatment  Patient Details  Name: Raymond [name] MRN: [id number] Date of Birth: 08-20-1960 Referring Provider (PT): Cristopher Peru, MD   Encounter Date: 12/15/2019  PT End of Session - 12/15/19 0946    Visit Number  4    Number of Visits  24    Date for PT Re-Evaluation  02/24/20    Authorization Type  Medicare reporting period from 12/02/2019    Authorization Time Period  n/a    Authorization - Visit Number  4    Authorization - Number of Visits  10    Progress Note Due on Visit  10    PT Start Time  0902    PT Stop Time  0925    PT Time Calculation (min)  23 min    Equipment Utilized During Treatment  Gait belt    Activity Tolerance  No increased pain;Patient tolerated treatment well    Behavior During Therapy  [facility] for tasks assessed/performed;Flat affect       Past Medical History:  Diagnosis Date  . Epilepsy (HCC)   . Epileptic seizures (HCC)   . Hyperlipidemia   . Mental retardation   . OCD (obsessive compulsive disorder)    water drinking  . OCD (obsessive compulsive disorder)   . Severe mitral regurgitation    a. 02/2016 Echo: EF 55-60%, no rwma, MVP of posterior leaflet w/ an anteriorly directed MR - severe. Sev dil LA; b. TTE 8/18: EF of 55-60%, normal wall motion, normal LV diastolic function parameters, severe mitral prolapse involving the anterior leaflet and posterior leaflet with severe regurgitation directed anteriorly, severely dilated left atrium, unable to estimate the PASP  . Systolic murmur     Past Surgical History:  Procedure Laterality Date  . COLONOSCOPY WITH PROPOFOL N/A 03/23/2019   Procedure: COLONOSCOPY WITH PROPOFOL;  Surgeon: Toledo, Boykin Nearing, MD;  Location: [facility] ENDOSCOPY;  Service: Gastroenterology;  Laterality: N/A;  . ESOPHAGOGASTRODUODENOSCOPY (EGD) WITH PROPOFOL  N/A 03/23/2019   Procedure: ESOPHAGOGASTRODUODENOSCOPY (EGD) WITH PROPOFOL;  Surgeon: Toledo, Boykin Nearing, MD;  Location: [facility] ENDOSCOPY;  Service: Gastroenterology;  Laterality: N/A;  NEEDS A RAPID    There were no vitals filed for this visit.  Subjective Assessment - 12/15/19 0934    Subjective  Patient arrives with caregiver, Venita Sheffield, who originally stays in the waiting room. She states he had a bad day yesterday with a lot of imbalance. Patient denies having "the weebles" today and also denies pain. States he fell "a little bit" since last session.    Pertinent History  Patient is a 59 y.o. male who presents to outpatient physical therapy with a referral for medical diagnosis imbalance. This patient's chief complaints consist of episodes of imbalance affecting him in sitting and standing positions leading to the following functional deficits: is dependent on assistance to do all ADLs, IADLs and ambulation (is independent with ADLs and ambulation at baseline). Usually must stay in bed until imbalance improves. Relevant past medical history and comorbidities include intellectual disability (lives in group home), double heart murmur (medically managed, severe mitral regurgitation), epilepsy (triggers unknown, has had very few since at this group home, usually at night, last was 2 months ago, caregiver has not seen them), reflux.  Patient denies hx of cancer, stroke, lung problems, major cardiac events (besides heart murmurs), diabetes,changes in bowel or bladder problems, new onset  stumbling or dropping things apart from described below. Caregiver reports some unexplained weight loss a little that has improved with adding ensure and is monitored by physician.    Limitations  Sitting;Standing;Walking;Lifting;House hold activities;Other (comment)   is dependent on assistance to do all ADLs, IADLs and ambulation (is independent with ADLs and ambulation at baseline).   Currently in Pain?  No/denies          TREATMENT:  Neuromuscular Re-education:to improve, balance, postural strength, muscle activation patterns, and stabilization strength required for functional activities:  Standing exercises to improve balance and equilibrium (CGA - min A to prevent falls, patient counting out loud when able): - step ups to 4 inch step with intermittent unilateral R UE support at treadmill bar. x10 leading with each foot. Min A at times to prevent falls.  - caregiver, [name], entered the gym and expressed further concern about pt's episode of imbalance yesterday, stating she is taking him to the doctor tomorrow (earliest appt she could get) and not to be "too hard on him" today. Patient with smell of stool/or flatulence and agreed he may need to use the restroom. [name] received him back into her care to take him to the restroom and returned. She took him to go into the restroom alone and he returned with no incidence. Gladys asked for more exercises she could help him with at home.  - attempted alternating toe taps on 9 inch step. Pt attempted to step up with both feet, losing his balance, requiring mod A physical assistance to prevent fall. When pt understood to just tap the step, he did 3 taps before caregiver stepped in and said she wanted to take him home. He hooked his toe on the edge of the step and knocked it over. [name] thought he may need a change of clothes and she was concerned about his performance and participating in PT. Asked if he seems weak. - worked on update of HEP, but did not complete due to caregiver wanting to take pt home.  - assisted pt with donning/doffing of jacket during standing balance.   Patient walked out on his own power with Gladys holding his arm to guard for imbalance.   HOME EXERCISE PROGRAM Access Code: [email or web address] URL: [email or web address] Date: 12/02/2019 Prepared by: Rosita Kea  Exercises Seated March - 1 x daily - 20 reps   PT Education - 12/15/19  0943    Education Details  Exercise form/purpose. discussed caregiver's concerns with her    Person(s) Educated  Patient;Caregiver(s)    Methods  Explanation;Demonstration;Tactile cues;Verbal cues    Comprehension  Verbalized understanding;Returned demonstration;Verbal cues required;Tactile cues required;Need further instruction       PT Short Term Goals - 12/03/19 1855      PT SHORT TERM GOAL #1   Title  Be independent with initial home exercise program with minimal assistance for self-management of symptoms.    Baseline  Initial HEP provided at IE (12/02/2019);        PT Long Term Goals - 12/03/19 1857      PT LONG TERM GOAL #1   Title  Be independent at group home with a long-term home exercise program for self-management of symptoms.    Baseline  initial HEP Provied at IE (12/02/2019);    Time  12    Period  Weeks    Status  New    Target Date  --   TARGET DATE FOR ALL LONG TERM GOALS: 02/24/2020     PT LONG  TERM GOAL #2   Title  Improve 5 Time Sit To Stand Test to equal or less than 11 seconds to demonstrate improved functional strenght/power/coordination/balance for transfers.    Baseline  17 seconds from 18.5 inch plinth with no UE support. SBA for safety (12/02/2019);    Time  12    Period  Weeks    Status  New      PT LONG TERM GOAL #3   Title  Improve BERG Balance Scale score to equal or greater than 52/56 to demonstrate lower (> 25%) fall risk.    Baseline  23/56 <36 = High risk for falls (12/02/2019);    Time  12    Period  Weeks    Status  New      PT LONG TERM GOAL #4   Title  Patient/caregiver will report equal or less than one episode of imbalance per month to improve independence and decrease risk of falls.    Baseline  one or two days every week to two weeks (12/02/2019);    Time  12    Period  Weeks    Status  New      PT LONG TERM GOAL #5   Title  Complete community, work and/or recreational activities without limitation due to current condition.     Baseline  limiting ability to complete his usual basic ADLs, household and community mobility, transfers, sitting, work/education social participation without difficulty (12/02/2019);    Time  12    Period  Weeks    Status  New            Plan - 12/15/19 1005    Clinical Impression Statement  Patient tolerated treatment well and no worse than previous sessions, but caregiver, Venita Sheffield, became concerned when she saw him having difficulty with exercises and suspected that he may have soiled his clothing so she ended the session early. This was the first session that Venita Sheffield was present during the treatment to see his performance. Patient also needed a bathroom break for about 9 minutes during the session. Patient did not appear as off balance as previous sessions and did not have as strong a right lean. Was able to lift R LE more easily than previous sessions. However, he continues to have considerable balance deficits and continued to have difficulty completing step ups and toe taps. Did not identify a safe and effective exercise to update his HEP with this session, but the offer of assistance to complete them at home with his caregiver will make this more feasible, compared to attempting to do them independently, due to pt's imbalance and cognitive limitations. Caregiver concerned pt is having episodes of vertigo and desires further medical evaluation, stating the imbalance episodes seem to be worsening and getting more frequent. States pt would not eat yesterday. Agree with caregiver that patient should return to physician for further medical evaluation, as she is more acquainted with his baseline than this PT. Also informed caregiver that vestibular evaluation may be available but our vestibular specialists are at the hospital location but may be able to arrange a vestibular evaluation here at [facility]. Patient would benefit from continued management of limiting condition by skilled physical therapist  as medically appropriate to address remaining impairments and functional limitations to work towards stated goals and return to PLOF or maximal functional independence.    Personal Factors and Comorbidities  Age;Comorbidity 3+;Behavior Pattern;Education;Past/Current Experience;Social Background;Other;Transportation;Time since onset of injury/illness/exacerbation   intellectual disability   Comorbidities  intellectual  disability (lives in group home), double heart murmur (medically managed, severe mitral regurgitation), epilepsy (triggers unknown, has had very few since at this group home, usually at night, last was 2 months ago, caregiver has not seen them), reflux.    Examination-Activity Limitations  Bed Mobility;Bathing;Hygiene/Grooming;Squat;Stairs;Lift;Bend;Locomotion Level;Stand;Toileting;Reach Overhead;Caring for Others;Carry;Transfers;Sit;Dressing;Other   is dependent on assistance to do all ADLs, IADLs and ambulation during episodes of imbalance (is independent with ADLs and ambulation at baseline).   Examination-Participation Restrictions  Community Activity;Interpersonal Relationship;School;Other   work Museum/gallery curator  Evolving/Moderate complexity    Rehab Potential  Fair    PT Frequency  2x / week    PT Duration  12 weeks    PT Treatment/Interventions  ADLs/Self Care Home Management;Cryotherapy;Gait training;Stair training;Functional mobility training;Therapeutic activities;Therapeutic exercise;Balance training;Neuromuscular re-education;Cognitive remediation;Patient/family education;Manual techniques;Passive range of motion;Vestibular;Visual/perceptual remediation/compensation    PT Next Visit Plan  update HEP, balance training    PT Home Exercise Plan  [employer] Access Code: [email or web address]    Recommended Other Services  Return to physician for further medical assessment, possible vestibular eval    Consulted and Agree with Plan of Care  Patient;Other (Comment)    Optician, dispensing      Patient will benefit from skilled therapeutic intervention in order to improve the following deficits and impairments:  Abnormal gait, Decreased cognition, Decreased knowledge of use of DME, Dizziness, Improper body mechanics, Postural dysfunction, Decreased mobility, Decreased coordination, Decreased activity tolerance, Decreased strength, Decreased endurance, Impaired perceived functional ability, Difficulty walking, Decreased safety awareness, Decreased balance  Visit Diagnosis: Unsteadiness on feet  Difficulty in walking, not elsewhere classified  Unspecified lack of coordination  Repeated falls     Problem List Patient Active Problem List   Diagnosis Date Noted  . Nose fracture 04/09/2016  . Epileptic seizures (HCC) 01/12/2015  . Intellectual disability   . OCD (obsessive compulsive disorder)   . Hyperlipidemia   . Epilepsy (HCC)   . Systolic murmur     Luretha Murphy. Ilsa Iha, PT, DPT 12/15/19, 10:07 AM  [facility] [address], [place], [address] Phone: [phone]   Fax:  [phone]  Name: JAHRON HUNSINGER MRN: [id number] Date of Birth: July 22, 1961

## 2019-12-17 ENCOUNTER — Other Ambulatory Visit: Payer: Self-pay

## 2019-12-17 ENCOUNTER — Encounter: Payer: Self-pay | Admitting: Physical Therapy

## 2019-12-17 ENCOUNTER — Ambulatory Visit: Payer: Medicare Other | Admitting: Physical Therapy

## 2019-12-17 DIAGNOSIS — R296 Repeated falls: Secondary | ICD-10-CM

## 2019-12-17 DIAGNOSIS — R2681 Unsteadiness on feet: Secondary | ICD-10-CM | POA: Diagnosis not present

## 2019-12-17 DIAGNOSIS — R262 Difficulty in walking, not elsewhere classified: Secondary | ICD-10-CM

## 2019-12-17 DIAGNOSIS — R279 Unspecified lack of coordination: Secondary | ICD-10-CM

## 2019-12-17 NOTE — Therapy (Signed)
[facility] [facility] [address] [place], [address] Phone: [phone]   Fax:  [phone]  Physical Therapy Treatment  Patient Details  Name: KHRISTOPHER KAPAUN MRN: [id number] Date of Birth: [birth year] Referring Provider (PT): Cristopher Peru, MD   Encounter Date: 12/17/2019  PT End of Session - 12/17/19 1302    Visit Number  5    Number of Visits  24    Date for PT Re-Evaluation  02/24/20    Authorization Type  Medicare reporting period from 12/02/2019    Authorization Time Period  n/a    Authorization - Visit Number  5    Authorization - Number of Visits  10    Progress Note Due on Visit  10    PT Start Time  0900    PT Stop Time  0940    PT Time Calculation (min)  40 min    Equipment Utilized During Treatment  Gait belt    Activity Tolerance  No increased pain;Patient tolerated treatment well    Behavior During Therapy  [facility] for tasks assessed/performed;Flat affect       Past Medical History:  Diagnosis Date  . Epilepsy (HCC)   . Epileptic seizures (HCC)   . Hyperlipidemia   . Mental retardation   . OCD (obsessive compulsive disorder)    water drinking  . OCD (obsessive compulsive disorder)   . Severe mitral regurgitation    a. 02/2016 Echo: EF 55-60%, no rwma, MVP of posterior leaflet w/ an anteriorly directed MR - severe. Sev dil LA; b. TTE 8/18: EF of 55-60%, normal wall motion, normal LV diastolic function parameters, severe mitral prolapse involving the anterior leaflet and posterior leaflet with severe regurgitation directed anteriorly, severely dilated left atrium, unable to estimate the PASP  . Systolic murmur     Past Surgical History:  Procedure Laterality Date  . COLONOSCOPY WITH PROPOFOL N/A 03/23/2019   Procedure: COLONOSCOPY WITH PROPOFOL;  Surgeon: Toledo, Boykin Nearing, MD;  Location: [facility] ENDOSCOPY;  Service: Gastroenterology;  Laterality: N/A;  . ESOPHAGOGASTRODUODENOSCOPY (EGD) WITH PROPOFOL  N/A 03/23/2019   Procedure: ESOPHAGOGASTRODUODENOSCOPY (EGD) WITH PROPOFOL;  Surgeon: Toledo, Boykin Nearing, MD;  Location: [facility] ENDOSCOPY;  Service: Gastroenterology;  Laterality: N/A;  NEEDS A RAPID    There were no vitals filed for this visit.  Subjective Assessment - 12/17/19 0904    Subjective  Patient reports he is feeling well and has no pain upon arrival. Is here with caregiver, [name]. She explains that he saw the neurologist yesterday and decided to decrease a seizure medication and increase Keppra because some of the older medications cause more balance. Reports when Venita Sheffield took him home he needed an second meclizine and then he felt better. He has been scheduled for a second EEG (4 hour study). Must stay up 4 hours after bedtime. The doctor looked again at the recent brain MRI and thought that the imbalance could be related to atrophy there.    Pertinent History  Patient is a 59 y.o. male who presents to outpatient physical therapy with a referral for medical diagnosis imbalance. This patient's chief complaints consist of episodes of imbalance affecting him in sitting and standing positions leading to the following functional deficits: is dependent on assistance to do all ADLs, IADLs and ambulation (is independent with ADLs and ambulation at baseline). Usually must stay in bed until imbalance improves. Relevant past medical history and comorbidities include intellectual disability (lives in group home), double heart murmur (  medically managed, severe mitral regurgitation), epilepsy (triggers unknown, has had very few since at this group home, usually at night, last was 2 months ago, caregiver has not seen them), reflux.  Patient denies hx of cancer, stroke, lung problems, major cardiac events (besides heart murmurs), diabetes,changes in bowel or bladder problems, new onset stumbling or dropping things apart from described below. Caregiver reports some unexplained weight loss a little that has improved  with adding ensure and is monitored by physician.    Limitations  Sitting;Standing;Walking;Lifting;House hold activities;Other (comment)   is dependent on assistance to do all ADLs, IADLs and ambulation (is independent with ADLs and ambulation at baseline).   Currently in Pain?  No/denies       TREATMENT:  Neuromuscular Re-education:to improve, balance, postural strength, muscle activation patterns, and stabilization strength required for functional activities:  Standing exercises to improve balance and equilibrium (CGA - min A to prevent falls,patient counting out loud when able): - alternating toe taps on 4 inch step, x10 alternating sides, no SPC. Repeated with SPC with at least two severe loss of balance towards the right that required strong physical support to prevent fall. Patient at one point put cane between legs.  - practice with SPC on flat surface 10 x 20 feet. Pt carries cane in R hand and occasionally puts it down. Does not reliably use it in conjunction with either foot.  - curb practice with SPC up/down forward direction over 4 inch box positioned as a curb x 10 times. Patient carries cane and when he stumbles he does not use the cane at all to help prevent falls. Gilmer Mor does not appear to decrease fall risk due to pt's lack of awareness of it's purpose. If anything he occasionally leans on it more than appropriate, which could lead to increased fall risk. Discontinued use of cane.  - step ups to 4 inch step with no UE x10 alternating lead foot most times, but overall more left leading steps. Min A at times to prevent falls. - step up to airex with no UE support but required min A to prevent falls repeatedly towards the right. X 10 alternating lead foot.  - standing head rotation alternating looking at PT and caregiver while standing on airex pad and attempting to not hold on. 3x10 rotations.  - standing head nodding looking up and down at two targets while standing on airex pad  and attempting not to hold on. 3x10 (counting sometimes each direction some times after both directions).  - backwards walking 4x17 feet with no AD and CGA - min A to prevent falls.  - assisted pt with donning/doffing of jacket during standing balance.  - ambulated ~ 100 feet to vehicle including stepping down from curb with CGA for safety. Close supervision as he climbed up into the [place].   HOME EXERCISE PROGRAM Access Code: [email or web address] URL: [email or web address] Date: 12/02/2019 Prepared by: Norton Blizzard  Exercises Seated March - 1 x daily - 20 reps   PT Education - 12/17/19 1301    Education Details  Exercise form/purpose. discussed care with caregiver    Person(s) Educated  Patient;Caregiver(s)   [name]   Methods  Explanation;Demonstration;Tactile cues;Verbal cues    Comprehension  Verbalized understanding;Returned demonstration;Verbal cues required;Tactile cues required;Need further instruction       PT Short Term Goals - 12/03/19 1855      PT SHORT TERM GOAL #1   Title  Be independent with initial home exercise program with minimal assistance for self-management of  symptoms.    Baseline  Initial HEP provided at IE (12/02/2019);        PT Long Term Goals - 12/03/19 1857      PT LONG TERM GOAL #1   Title  Be independent at group home with a long-term home exercise program for self-management of symptoms.    Baseline  initial HEP Provied at IE (12/02/2019);    Time  12    Period  Weeks    Status  New    Target Date  --   TARGET DATE FOR ALL LONG TERM GOALS: 02/24/2020     PT LONG TERM GOAL #2   Title  Improve 5 Time Sit To Stand Test to equal or less than 11 seconds to demonstrate improved functional strenght/power/coordination/balance for transfers.    Baseline  17 seconds from 18.5 inch plinth with no UE support. SBA for safety (12/02/2019);    Time  12    Period  Weeks    Status  New      PT LONG TERM GOAL #3   Title  Improve BERG Balance Scale score to  equal or greater than 52/56 to demonstrate lower (> 25%) fall risk.    Baseline  23/56 <36 = High risk for falls (12/02/2019);    Time  12    Period  Weeks    Status  New      PT LONG TERM GOAL #4   Title  Patient/caregiver will report equal or less than one episode of imbalance per month to improve independence and decrease risk of falls.    Baseline  one or two days every week to two weeks (12/02/2019);    Time  12    Period  Weeks    Status  New      PT LONG TERM GOAL #5   Title  Complete community, work and/or recreational activities without limitation due to current condition.    Baseline  limiting ability to complete his usual basic ADLs, household and community mobility, transfers, sitting, work/education social participation without difficulty (12/02/2019);    Time  12    Period  Weeks    Status  New            Plan - 12/17/19 1323    Clinical Impression Statement  Patient tolerated treatment well overall but continues to struggle with activity due to imbalance. Does not seem aware of where feet are and misjudges foot placement as well as has difficulty maintaining sense of equilibrium, most commonly drifting or leaning to the right. Does better with flat walking compared to any type of uneven ground or situations that require lifting feet to step up or tap. Participated well. Caregiver, Dale's questions answered as possible. Patient appeared to be having a slightly better day today but did seem to struggle with balance more and more as the session went on. No significant progress noted overall given the variable nature of pt's symptoms.  Agree that patient's source of balance is likely multifactorial and that ruling out vertigo or vestibular issues would be helpful. Patient would benefit from continued management of limiting condition by skilled physical therapist to address remaining impairments and functional limitations to work towards stated goals and return to PLOF or maximal  functional independence.    Personal Factors and Comorbidities  Age;Comorbidity 3+;Behavior Pattern;Education;Past/Current Experience;Social Background;Other;Transportation;Time since onset of injury/illness/exacerbation   intellectual disability   Comorbidities  intellectual disability (lives in group home), double heart murmur (medically managed, severe mitral regurgitation),  epilepsy (triggers unknown, has had very few since at this group home, usually at night, last was 2 months ago, caregiver has not seen them), reflux.    Examination-Activity Limitations  Bed Mobility;Bathing;Hygiene/Grooming;Squat;Stairs;Lift;Bend;Locomotion Level;Stand;Toileting;Reach Overhead;Caring for Others;Carry;Transfers;Sit;Dressing;Other   is dependent on assistance to do all ADLs, IADLs and ambulation during episodes of imbalance (is independent with ADLs and ambulation at baseline).   Examination-Participation Restrictions  Community Activity;Interpersonal Relationship;School;Other   work Engineer, maintenance (IT)  Evolving/Moderate complexity    Rehab Potential  Fair    PT Frequency  2x / week    PT Duration  12 weeks    PT Treatment/Interventions  ADLs/Self Care Home Management;Cryotherapy;Gait training;Stair training;Functional mobility training;Therapeutic activities;Therapeutic exercise;Balance training;Neuromuscular re-education;Cognitive remediation;Patient/family education;Manual techniques;Passive range of motion;Vestibular;Visual/perceptual remediation/compensation    PT Next Visit Plan  update HEP, balance training    PT Home Exercise Plan  [place] Access Code: [email or web address]    Consulted and Agree with Plan of Care  Patient;Other (Comment)   Location manager      Patient will benefit from skilled therapeutic intervention in order to improve the following deficits and impairments:  Abnormal gait, Decreased cognition, Decreased knowledge of use of DME, Dizziness, Improper body mechanics,  Postural dysfunction, Decreased mobility, Decreased coordination, Decreased activity tolerance, Decreased strength, Decreased endurance, Impaired perceived functional ability, Difficulty walking, Decreased safety awareness, Decreased balance  Visit Diagnosis: Unsteadiness on feet  Difficulty in walking, not elsewhere classified  Unspecified lack of coordination  Repeated falls     Problem List Patient Active Problem List   Diagnosis Date Noted  . Nose fracture 04/09/2016  . Epileptic seizures ([place]) 01/12/2015  . Intellectual disability   . OCD (obsessive compulsive disorder)   . Hyperlipidemia   . Epilepsy ([place])   . Systolic murmur     Everlean Alstrom. Graylon Good, PT, DPT 12/17/19, 1:24 PM  [place] [facility] [address], [place], [address] Phone: [phone]   Fax:  [phone]  Name: SENAY SISTRUNK MRN: [id number] Date of Birth: 02/07/1961

## 2019-12-23 ENCOUNTER — Other Ambulatory Visit: Payer: Self-pay

## 2019-12-23 ENCOUNTER — Encounter: Payer: Self-pay | Admitting: Physical Therapy

## 2019-12-23 ENCOUNTER — Ambulatory Visit: Payer: Medicare Other | Admitting: Physical Therapy

## 2019-12-23 DIAGNOSIS — R2681 Unsteadiness on feet: Secondary | ICD-10-CM | POA: Diagnosis not present

## 2019-12-23 DIAGNOSIS — R296 Repeated falls: Secondary | ICD-10-CM

## 2019-12-23 DIAGNOSIS — R279 Unspecified lack of coordination: Secondary | ICD-10-CM

## 2019-12-23 DIAGNOSIS — R262 Difficulty in walking, not elsewhere classified: Secondary | ICD-10-CM

## 2019-12-23 NOTE — Therapy (Signed)
[facility] [address] [place], [address] Phone: [phone]   Fax:  [phone]  Physical Therapy Treatment  Patient Details  Name: Raymond [name] MRN: [id number] Date of Birth: 11/25/1960 Referring Provider (PT): Jennings Books, MD   Encounter Date: 12/23/2019  PT End of Session - 12/23/19 1053    Visit Number  6    Number of Visits  24    Date for PT Re-Evaluation  02/24/20    Authorization Type  Medicare reporting period from 12/02/2019    Authorization Time Period  n/a    Authorization - Visit Number  6    Authorization - Number of Visits  10    Progress Note Due on Visit  10    PT Start Time  [phone]    PT Stop Time  1030    PT Time Calculation (min)  43 min    Equipment Utilized During Treatment  Gait belt    Activity Tolerance  No increased pain;Patient tolerated treatment well    Behavior During Therapy  [facility] for tasks assessed/performed;Flat affect       Past Medical History:  Diagnosis Date  . Epilepsy ([place])   . Epileptic seizures ([place])   . Hyperlipidemia   . Mental retardation   . OCD (obsessive compulsive disorder)    water drinking  . OCD (obsessive compulsive disorder)   . Severe mitral regurgitation    a. 02/2016 Echo: EF 55-60%, no rwma, MVP of posterior leaflet w/ an anteriorly directed MR - severe. Sev dil LA; b. TTE 8/18: EF of 55-60%, normal wall motion, normal LV diastolic function parameters, severe mitral prolapse involving the anterior leaflet and posterior leaflet with severe regurgitation directed anteriorly, severely dilated left atrium, unable to estimate the PASP  . Systolic murmur     Past Surgical History:  Procedure Laterality Date  . COLONOSCOPY WITH PROPOFOL N/A 03/23/2019   Procedure: COLONOSCOPY WITH PROPOFOL;  Surgeon: Toledo, Benay Pike, MD;  Location: [facility] ENDOSCOPY;  Service: Gastroenterology;  Laterality: N/A;  . ESOPHAGOGASTRODUODENOSCOPY (EGD) WITH PROPOFOL  N/A 03/23/2019   Procedure: ESOPHAGOGASTRODUODENOSCOPY (EGD) WITH PROPOFOL;  Surgeon: Toledo, Benay Pike, MD;  Location: [facility] ENDOSCOPY;  Service: Gastroenterology;  Laterality: N/A;  NEEDS A RAPID    There were no vitals filed for this visit.  Subjective Assessment - 12/23/19 0951    Subjective  Patient here with caregiver, Quita Skye. Patient reports he has no pain upon arrival. They report he had meclizine about 7am this morning because he reported feeling a bit "weebly" this morning. No falls since last session. They brought a quad cane to practie with today. States he has tried to walk a little with it.    Patient is accompained by:  --   Caregiver   Pertinent History  Patient is a 59 y.o. male who presents to outpatient physical therapy with a referral for medical diagnosis imbalance. This patient's chief complaints consist of episodes of imbalance affecting him in sitting and standing positions leading to the following functional deficits: is dependent on assistance to do all ADLs, IADLs and ambulation (is independent with ADLs and ambulation at baseline). Usually must stay in bed until imbalance improves. Relevant past medical history and comorbidities include intellectual disability (lives in group home), double heart murmur (medically managed, severe mitral regurgitation), epilepsy (triggers unknown, has had very few since at this group home, usually at night, last was 2 months ago, caregiver has not seen them), reflux.  Patient denies hx of cancer, stroke, lung problems, major cardiac events (besides heart murmurs), diabetes,changes in bowel or bladder problems, new onset stumbling or dropping things apart from described below. Caregiver reports some unexplained weight loss a little that has improved with adding ensure and is monitored by physician.    Limitations  Sitting;Standing;Walking;Lifting;House hold activities;Other (comment)   is dependent on assistance to do all ADLs, IADLs and ambulation  (is independent with ADLs and ambulation at baseline).   Currently in Pain?  No/denies       TREATMENT:  Neuromuscular Re-education:to improve, balance, postural strength, muscle activation patterns, and stabilization strength required for functional activities (close supervision, CGA, min A and mod A provided in all standing activities to prevent falls):  Standing exercises to improve balance and equilibrium (CGA - min A to prevent falls,patient counting out loud when able): - ambulation up and down hallway (20 feet) x 6 and 2x around clinic 200 feet. Staggering. Seems worse following turning.  - horizontal head turns on firm surface x 10 - ambulation x 100 feet including 20 feet of horizontal head turns. Remains similarly unstable and has difficulty walking and turning head at the same time.  - standing vertical head nods, x 10. Difficulty with cuing and balance.  - seated rest due to becoming more and more sleepy/unbalanced - standing vertical head nods x 10 continues to have difficulty remembering to move head.  - ambulation x100 feet with vertical head nods attempted for 20 feet of it. Patient significantly more unsteady than previously.  - attempted standing head nods in a different location to see if he can follow cuing better. Started to seem pretty drowsy and not as responsive. Walked him back to treatment room (~50 feet) - seated rest, patient sits up without leaning with legs crossed. Appears drowsy but responds to verbal cuing.  Blood pressure measurements to test for orthostatic hypotension:   After sitting for 2 minutes: BP 109/67 mmHg, HR 63 bpm  Immediately upon standing: BP 108/67 mmHg, HR 67 bpm - practice ambulating with quad cane from home in right UE x approximately 50 feet with stopping each step to cue. Patient unable to coordinate L LE and cane at the same time more than breifly. Kept moving cane in front of R foot and tripping over it. Very unsteady. Lost balance  completely to the right posterior and required strong physical assist to prevent fall.  - ambulation back to room (~50 feet) without cane with much better balance but still unsteady and requiring up to Min A to prevent falls.  - ambulated ~ 100 feet to vehicle including stepping down from curb with CGA for safety. Close supervision as he climbed up into the [place].  - discussed with [name] the purpose of the exercises today, poor performance with cane. Possible medication side effect of drowsiness. Lack of orthostatic hypotension findings. Possible overall fatigue and option to try no meclizine next session and or attempt cane training and or vertical head nods at start of next session for comparison to help tease out effect of fatigue over the course of each session.  - ambulated ~ 100 feet to vehicle including stepping down from curb with CGA for safety. Close supervision as he climbed up into the [place].   HOME EXERCISE PROGRAM Access Code: [email or web address] URL: [email or web address] Date: 12/02/2019 Prepared by: Norton Blizzard  Exercises Seated March - 1 x daily - 20 reps   PT Education - 12/23/19 1053    Education Details  Exercise form/purpose. discussed care with caregiver    Person(s) Educated  Patient;Caregiver(s)   [name]   Methods  Explanation;Demonstration;Tactile cues;Verbal cues    Comprehension  Verbalized understanding;Returned demonstration;Verbal cues required;Tactile cues required;Need further instruction       PT Short Term Goals - 12/03/19 1855      PT SHORT TERM GOAL #1   Title  Be independent with initial home exercise program with minimal assistance for self-management of symptoms.    Baseline  Initial HEP provided at IE (12/02/2019);        PT Long Term Goals - 12/03/19 1857      PT LONG TERM GOAL #1   Title  Be independent at group home with a long-term home exercise program for self-management of symptoms.    Baseline  initial HEP Provied at IE (12/02/2019);     Time  12    Period  Weeks    Status  New    Target Date  --   TARGET DATE FOR ALL LONG TERM GOALS: 02/24/2020     PT LONG TERM GOAL #2   Title  Improve 5 Time Sit To Stand Test to equal or less than 11 seconds to demonstrate improved functional strenght/power/coordination/balance for transfers.    Baseline  17 seconds from 18.5 inch plinth with no UE support. SBA for safety (12/02/2019);    Time  12    Period  Weeks    Status  New      PT LONG TERM GOAL #3   Title  Improve BERG Balance Scale score to equal or greater than 52/56 to demonstrate lower (> 25%) fall risk.    Baseline  23/56 <36 = High risk for falls (12/02/2019);    Time  12    Period  Weeks    Status  New      PT LONG TERM GOAL #4   Title  Patient/caregiver will report equal or less than one episode of imbalance per month to improve independence and decrease risk of falls.    Baseline  one or two days every week to two weeks (12/02/2019);    Time  12    Period  Weeks    Status  New      PT LONG TERM GOAL #5   Title  Complete community, work and/or recreational activities without limitation due to current condition.    Baseline  limiting ability to complete his usual basic ADLs, household and community mobility, transfers, sitting, work/education social participation without difficulty (12/02/2019);    Time  12    Period  Weeks    Status  New            Plan - 12/23/19 1053    Clinical Impression Statement  Patient tolerated treatment well overall but seemed to get increasingly imbalanced as the session continued. Also appeared to get drowsy and/or struggle with attention span more the second half of the session. Was negative for orthostatic hypotension from sit to stand. Did appear to have increased difficulty with balance for short periods directly following or during horizontal turning motions but short lived. Had a lot more difficulty with vertical head nods, which was performed and assessed second in the later  part of the session. The effect of fatigue or attention span may have contributed to increased imbalance as the session went on. Plan to perform vertical head turns while fresher at start of next session if appropriate. Also may perform cane training closer to the start of  the session, but at this point pt seems less steady with the cane. Patient would benefit from continued management of limiting condition by skilled physical therapist to address remaining impairments and functional limitations to work towards stated goals and return to PLOF or maximal functional independence.    Personal Factors and Comorbidities  Age;Comorbidity 3+;Behavior Pattern;Education;Past/Current Experience;Social Background;Other;Transportation;Time since onset of injury/illness/exacerbation   intellectual disability   Comorbidities  intellectual disability (lives in group home), double heart murmur (medically managed, severe mitral regurgitation), epilepsy (triggers unknown, has had very few since at this group home, usually at night, last was 2 months ago, caregiver has not seen them), reflux.    Examination-Activity Limitations  Bed Mobility;Bathing;Hygiene/Grooming;Squat;Stairs;Lift;Bend;Locomotion Level;Stand;Toileting;Reach Overhead;Caring for Others;Carry;Transfers;Sit;Dressing;Other   is dependent on assistance to do all ADLs, IADLs and ambulation during episodes of imbalance (is independent with ADLs and ambulation at baseline).   Examination-Participation Restrictions  Community Activity;Interpersonal Relationship;School;Other   work Museum/gallery curator  Evolving/Moderate complexity    Rehab Potential  Fair    PT Frequency  2x / week    PT Duration  12 weeks    PT Treatment/Interventions  ADLs/Self Care Home Management;Cryotherapy;Gait training;Stair training;Functional mobility training;Therapeutic activities;Therapeutic exercise;Balance training;Neuromuscular re-education;Cognitive  remediation;Patient/family education;Manual techniques;Passive range of motion;Vestibular;Visual/perceptual remediation/compensation    PT Next Visit Plan  update HEP, balance training    PT Home Exercise Plan  [facility] Access Code: [email or web address]    Consulted and Agree with Plan of Care  Patient;Other (Comment)   [place]      Patient will benefit from skilled therapeutic intervention in order to improve the following deficits and impairments:  Abnormal gait, Decreased cognition, Decreased knowledge of use of DME, Dizziness, Improper body mechanics, Postural dysfunction, Decreased mobility, Decreased coordination, Decreased activity tolerance, Decreased strength, Decreased endurance, Impaired perceived functional ability, Difficulty walking, Decreased safety awareness, Decreased balance  Visit Diagnosis: Unsteadiness on feet  Difficulty in walking, not elsewhere classified  Unspecified lack of coordination  Repeated falls     Problem List Patient Active Problem List   Diagnosis Date Noted  . Nose fracture 04/09/2016  . Epileptic seizures ([facility]) 01/12/2015  . Intellectual disability   . OCD (obsessive compulsive disorder)   . Hyperlipidemia   . Epilepsy ([facility])   . Systolic murmur     Luretha Murphy. Ilsa Iha, PT, DPT 12/23/19, 10:55 AM  [facility] [address] [place], [address] Phone: [phone]   Fax:  [phone]  Name: YAHIA BOTTGER MRN: [id number] Date of Birth: June 11, 1961

## 2019-12-29 ENCOUNTER — Encounter: Payer: Self-pay | Admitting: Physical Therapy

## 2019-12-29 ENCOUNTER — Ambulatory Visit: Payer: Medicare Other | Attending: Neurology | Admitting: Physical Therapy

## 2019-12-29 ENCOUNTER — Other Ambulatory Visit: Payer: Self-pay

## 2019-12-29 DIAGNOSIS — R279 Unspecified lack of coordination: Secondary | ICD-10-CM

## 2019-12-29 DIAGNOSIS — R262 Difficulty in walking, not elsewhere classified: Secondary | ICD-10-CM | POA: Diagnosis present

## 2019-12-29 DIAGNOSIS — R296 Repeated falls: Secondary | ICD-10-CM | POA: Diagnosis present

## 2019-12-29 DIAGNOSIS — R2681 Unsteadiness on feet: Secondary | ICD-10-CM | POA: Diagnosis present

## 2019-12-29 NOTE — Therapy (Signed)
[facility] [address] [place], [address] Phone: [phone]   Fax:  [phone]  Physical Therapy Treatment  Patient Details  Name: Raymond [name] MRN: [id number] Date of Birth: 1960/12/31 Referring Provider (PT): Cristopher Peru, MD   Encounter Date: 12/29/2019  PT End of Session - 12/29/19 1042    Visit Number  7    Number of Visits  24    Date for PT Re-Evaluation  02/24/20    Authorization Type  Medicare reporting period from 12/02/2019    Authorization Time Period  n/a    Authorization - Visit Number  7    Authorization - Number of Visits  10    Progress Note Due on Visit  10    PT Start Time  0900    PT Stop Time  0940    PT Time Calculation (min)  40 min    Equipment Utilized During Treatment  Gait belt    Activity Tolerance  No increased pain;Patient tolerated treatment well    Behavior During Therapy  [facility] for tasks assessed/performed;Flat affect   short attention span      Past Medical History:  Diagnosis Date  . Epilepsy (HCC)   . Epileptic seizures (HCC)   . Hyperlipidemia   . Mental retardation   . OCD (obsessive compulsive disorder)    water drinking  . OCD (obsessive compulsive disorder)   . Severe mitral regurgitation    a. 02/2016 Echo: EF 55-60%, no rwma, MVP of posterior leaflet w/ an anteriorly directed MR - severe. Sev dil LA; b. TTE 8/18: EF of 55-60%, normal wall motion, normal LV diastolic function parameters, severe mitral prolapse involving the anterior leaflet and posterior leaflet with severe regurgitation directed anteriorly, severely dilated left atrium, unable to estimate the PASP  . Systolic murmur     Past Surgical History:  Procedure Laterality Date  . COLONOSCOPY WITH PROPOFOL N/A 03/23/2019   Procedure: COLONOSCOPY WITH PROPOFOL;  Surgeon: Toledo, Boykin Nearing, MD;  Location: [facility] ENDOSCOPY;  Service: Gastroenterology;  Laterality: N/A;  . ESOPHAGOGASTRODUODENOSCOPY  (EGD) WITH PROPOFOL N/A 03/23/2019   Procedure: ESOPHAGOGASTRODUODENOSCOPY (EGD) WITH PROPOFOL;  Surgeon: Toledo, Boykin Nearing, MD;  Location: [facility] ENDOSCOPY;  Service: Gastroenterology;  Laterality: N/A;  NEEDS A RAPID    There were no vitals filed for this visit.  Subjective Assessment - 12/29/19 1040    Subjective  Patient is here with caregiver, Raymond [name]. They state he has not had any falls since last treatment session. Pt denies pain. Raymond [name] states she is continuing to work on getting referral to ENT. Says there are no new uptdates to pt's medical history or medications.    Patient is accompained by:  --   Caregiver   Pertinent History  Patient is a 59 y.o. male who presents to outpatient physical therapy with a referral for medical diagnosis imbalance. This patient's chief complaints consist of episodes of imbalance affecting him in sitting and standing positions leading to the following functional deficits: is dependent on assistance to do all ADLs, IADLs and ambulation (is independent with ADLs and ambulation at baseline). Usually must stay in bed until imbalance improves. Relevant past medical history and comorbidities include intellectual disability (lives in group home), double heart murmur (medically managed, severe mitral regurgitation), epilepsy (triggers unknown, has had very few since at this group home, usually at night, last was 2 months ago, caregiver has not seen them), reflux.  Patient denies hx of cancer, stroke,  lung problems, major cardiac events (besides heart murmurs), diabetes,changes in bowel or bladder problems, new onset stumbling or dropping things apart from described below. Caregiver reports some unexplained weight loss a little that has improved with adding ensure and is monitored by physician.    Limitations  Sitting;Standing;Walking;Lifting;House hold activities;Other (comment)   is dependent on assistance to do all ADLs, IADLs and ambulation (is independent with ADLs and  ambulation at baseline).   Currently in Pain?  No/denies       TREATMENT:  Neuromuscular Re-education:to improve, balance, postural strength, muscle activation patterns, and stabilization strength required for functional activities (close supervision, CGA, min A and mod A provided in all standing activities to prevent falls):  Standing exercises to improve balance and equilibrium (CGA - min A to prevent falls,patient counting out loud when able):  Standing on airex pad (unless otherwise noted or walking), attempting no UE support but within reach to improve safety, pt needs consistent cuing to use "no hands":  - standing vertical head nods, 3x 10-15 (50 foot active walking break in between sets) - ambulation 2x 100 feet including 20 feet of vertical head nods - standing horizontal head turns 3x10-15 (50 foot active walking break in between sets.  - standing with self-selected stance, 2x30 seconds. (uses step strategy to recover from loss of balance or requires therapist assistance to maintain COM over BOS, worsening with attempts, so discontinued, appears to get sleepy).  - standing rows, firm surface, x10 with 5#, 2x10 10# cable. To improve equilibrium and coordination for stable standing with variable force applied. (Pt enjoyed but started to lean back maximally and was not able to respond to cuing to stand up tall and move arms only. Recommend attempt at earlier part of session or with single arm).  - suitcase carry ambulation with 6# DB 2x100 feet each side to improve trunk control (improved trunk control when carried in R UE).  - standing bicep curls with 6# DB, x 10 each side - ambulated ~ 100 feet to vehicle including stepping down from curb with CGA for safety. Close supervision as he climbed up into the [place].  HOME EXERCISE PROGRAM Access Code: [email or web address] URL: [email or web address] Date: 12/02/2019 Prepared by: Rosita Kea  Exercises Seated March - 1 x daily -  20 reps    PT Education - 12/29/19 1042    Education Details  Exercise form/purpose. discussed care with caregiver    Person(s) Educated  Patient;Caregiver(s)    Methods  Explanation;Demonstration;Tactile cues;Verbal cues    Comprehension  Verbalized understanding;Returned demonstration;Verbal cues required;Tactile cues required;Need further instruction       PT Short Term Goals - 12/03/19 1855      PT SHORT TERM GOAL #1   Title  Be independent with initial home exercise program with minimal assistance for self-management of symptoms.    Baseline  Initial HEP provided at IE (12/02/2019);        PT Long Term Goals - 12/03/19 1857      PT LONG TERM GOAL #1   Title  Be independent at group home with a long-term home exercise program for self-management of symptoms.    Baseline  initial HEP Provied at IE (12/02/2019);    Time  12    Period  Weeks    Status  New    Target Date  --   TARGET DATE FOR ALL LONG TERM GOALS: 02/24/2020     PT LONG TERM GOAL #2   Title  Improve 5 Time Sit  To Stand Test to equal or less than 11 seconds to demonstrate improved functional strenght/power/coordination/balance for transfers.    Baseline  17 seconds from 18.5 inch plinth with no UE support. SBA for safety (12/02/2019);    Time  12    Period  Weeks    Status  New      PT LONG TERM GOAL #3   Title  Improve BERG Balance Scale score to equal or greater than 52/56 to demonstrate lower (> 25%) fall risk.    Baseline  23/56 <36 = High risk for falls (12/02/2019);    Time  12    Period  Weeks    Status  New      PT LONG TERM GOAL #4   Title  Patient/caregiver will report equal or less than one episode of imbalance per month to improve independence and decrease risk of falls.    Baseline  one or two days every week to two weeks (12/02/2019);    Time  12    Period  Weeks    Status  New      PT LONG TERM GOAL #5   Title  Complete community, work and/or recreational activities without limitation due to  current condition.    Baseline  limiting ability to complete his usual basic ADLs, household and community mobility, transfers, sitting, work/education social participation without difficulty (12/02/2019);    Time  12    Period  Weeks    Status  New            Plan - 12/29/19 1054    Clinical Impression Statement  Patient tolerated treatment well overall and appeared to be having a more normal balance day without meclizine. Caregiver, Raymond [name], stayed throughout session. Patient's balance gradually worsened over the course of the session and did not seem to be more affected by any intervention compared to another. Patient's attention span tends to wane at about the half way mark and he has increased difficulty staying on task with correlating increased lean to the right side. Practiced cuing for pt feeling leaning and self correction with mild success. Patient was more engaged with activities using cable pull or DB but required heavy cuing and physical assistance to use safely. Patient would benefit from continued management of limiting condition by skilled physical therapist to address remaining impairments and functional limitations to work towards stated goals and return to PLOF or maximal functional independence.    Personal Factors and Comorbidities  Age;Comorbidity 3+;Behavior Pattern;Education;Past/Current Experience;Social Background;Other;Transportation;Time since onset of injury/illness/exacerbation   intellectual disability   Comorbidities  intellectual disability (lives in group home), double heart murmur (medically managed, severe mitral regurgitation), epilepsy (triggers unknown, has had very few since at this group home, usually at night, last was 2 months ago, caregiver has not seen them), reflux.    Examination-Activity Limitations  Bed Mobility;Bathing;Hygiene/Grooming;Squat;Stairs;Lift;Bend;Locomotion Level;Stand;Toileting;Reach Overhead;Caring for  Others;Carry;Transfers;Sit;Dressing;Other   is dependent on assistance to do all ADLs, IADLs and ambulation during episodes of imbalance (is independent with ADLs and ambulation at baseline).   Examination-Participation Restrictions  Community Activity;Interpersonal Relationship;School;Other   work Museum/gallery curator  Evolving/Moderate complexity    Rehab Potential  Fair    PT Frequency  2x / week    PT Duration  12 weeks    PT Treatment/Interventions  ADLs/Self Care Home Management;Cryotherapy;Gait training;Stair training;Functional mobility training;Therapeutic activities;Therapeutic exercise;Balance training;Neuromuscular re-education;Cognitive remediation;Patient/family education;Manual techniques;Passive range of motion;Vestibular;Visual/perceptual remediation/compensation    PT Next Visit Plan  update HEP, balance  training    PT Home Exercise Plan  [employer] Access Code: [email or web address]    Consulted and Agree with Plan of Care  Patient;Other (Comment)   [place]      Patient will benefit from skilled therapeutic intervention in order to improve the following deficits and impairments:  Abnormal gait, Decreased cognition, Decreased knowledge of use of DME, Dizziness, Improper body mechanics, Postural dysfunction, Decreased mobility, Decreased coordination, Decreased activity tolerance, Decreased strength, Decreased endurance, Impaired perceived functional ability, Difficulty walking, Decreased safety awareness, Decreased balance  Visit Diagnosis: Unsteadiness on feet  Difficulty in walking, not elsewhere classified  Unspecified lack of coordination  Repeated falls     Problem List Patient Active Problem List   Diagnosis Date Noted  . Nose fracture 04/09/2016  . Epileptic seizures (HCC) 01/12/2015  . Intellectual disability   . OCD (obsessive compulsive disorder)   . Hyperlipidemia   . Epilepsy (HCC)   . Systolic murmur    Luretha Murphy. Ilsa Iha, PT,  DPT 12/29/19, 10:55 AM   [facility] PH[facility] SPOR[facility] MEDIC[facility] [address] [place], [address] Phone: [phone]   Fax:  [phone]  Name: SHIMSHON NARULA MRN: [id number] Date of Birth: February 12, 1961

## 2019-12-31 ENCOUNTER — Ambulatory Visit: Payer: Medicare Other | Admitting: Physical Therapy

## 2020-01-05 ENCOUNTER — Encounter: Payer: Medicare Other | Admitting: Physical Therapy

## 2020-01-06 ENCOUNTER — Encounter: Payer: Medicare Other | Admitting: Physical Therapy

## 2020-01-06 ENCOUNTER — Ambulatory Visit: Payer: Medicare Other | Admitting: Physical Therapy

## 2020-01-12 ENCOUNTER — Encounter: Payer: Self-pay | Admitting: Physical Therapy

## 2020-01-12 ENCOUNTER — Ambulatory Visit: Payer: Medicare Other | Admitting: Physical Therapy

## 2020-01-12 ENCOUNTER — Other Ambulatory Visit: Payer: Self-pay

## 2020-01-12 DIAGNOSIS — R2681 Unsteadiness on feet: Secondary | ICD-10-CM

## 2020-01-12 DIAGNOSIS — R279 Unspecified lack of coordination: Secondary | ICD-10-CM

## 2020-01-12 DIAGNOSIS — R296 Repeated falls: Secondary | ICD-10-CM

## 2020-01-12 DIAGNOSIS — R262 Difficulty in walking, not elsewhere classified: Secondary | ICD-10-CM

## 2020-01-12 NOTE — Therapy (Signed)
[facility] [address] [place], [address] Phone: [phone]   Fax:  [phone]  Physical Therapy Treatment  Patient Details  Name: Raymond [name] MRN: [id number] Date of Birth: 04-19-1961 Referring Provider (PT): Cristopher Peru, MD   Encounter Date: 01/12/2020   PT End of Session - 01/12/20 1511    Visit Number 8    Number of Visits 24    Date for PT Re-Evaluation 02/24/20    Authorization Type Medicare reporting period from 12/02/2019    Authorization Time Period n/a    Authorization - Visit Number 8    Authorization - Number of Visits 10    Progress Note Due on Visit 10    PT Start Time 0950    PT Stop Time 1030    PT Time Calculation (min) 40 min    Equipment Utilized During Treatment Gait belt    Activity Tolerance No increased pain;Patient tolerated treatment well    Behavior During Therapy [facility] for tasks assessed/performed;Flat affect   short attention span          Past Medical History:  Diagnosis Date  . Epilepsy (HCC)   . Epileptic seizures (HCC)   . Hyperlipidemia   . Mental retardation   . OCD (obsessive compulsive disorder)    water drinking  . OCD (obsessive compulsive disorder)   . Severe mitral regurgitation    a. 02/2016 Echo: EF 55-60%, no rwma, MVP of posterior leaflet w/ an anteriorly directed MR - severe. Sev dil LA; b. TTE 8/18: EF of 55-60%, normal wall motion, normal LV diastolic function parameters, severe mitral prolapse involving the anterior leaflet and posterior leaflet with severe regurgitation directed anteriorly, severely dilated left atrium, unable to estimate the PASP  . Systolic murmur     Past Surgical History:  Procedure Laterality Date  . COLONOSCOPY WITH PROPOFOL N/A 03/23/2019   Procedure: COLONOSCOPY WITH PROPOFOL;  Surgeon: Toledo, Boykin Nearing, MD;  Location: [facility] ENDOSCOPY;  Service: Gastroenterology;  Laterality: N/A;  . ESOPHAGOGASTRODUODENOSCOPY (EGD)  WITH PROPOFOL N/A 03/23/2019   Procedure: ESOPHAGOGASTRODUODENOSCOPY (EGD) WITH PROPOFOL;  Surgeon: Toledo, Boykin Nearing, MD;  Location: [facility] ENDOSCOPY;  Service: Gastroenterology;  Laterality: N/A;  NEEDS A RAPID    There were no vitals filed for this visit.   Subjective Assessment - 01/12/20 1507    Subjective Patient is here with caregiver, Venita Sheffield, who accompanied him entire session. Patient reports he is feeling well and does not feel "weebly" today. Venita Sheffield confirms she thinks he is feeling well and had no falls that she knows of since last session. States he mostly lays around and is inactive. She would like to work with him on some home exercises. States he has responded well to her efforts to work with him on learning numbers, etc, since she has been working as his caregiver the last few months. PT spoke with caregiver [name] several days ago about his recent ENT visit where no vestibular cause was found for his imbalance. Venita Sheffield confirms they did not find anything and felt that it was related to his brain atrophy, etc.    Patient is accompained by: --   Caregiver   Pertinent History Patient is a 59 y.o. male who presents to outpatient physical therapy with a referral for medical diagnosis imbalance. This patient's chief complaints consist of episodes of imbalance affecting him in sitting and standing positions leading to the following functional deficits: is dependent on assistance to do all ADLs,  IADLs and ambulation (is independent with ADLs and ambulation at baseline). Usually must stay in bed until imbalance improves. Relevant past medical history and comorbidities include intellectual disability (lives in group home), double heart murmur (medically managed, severe mitral regurgitation), epilepsy (triggers unknown, has had very few since at this group home, usually at night, last was 2 months ago, caregiver has not seen them), reflux.  Patient denies hx of cancer, stroke, lung problems, major cardiac  events (besides heart murmurs), diabetes,changes in bowel or bladder problems, new onset stumbling or dropping things apart from described below. Caregiver reports some unexplained weight loss a little that has improved with adding ensure and is monitored by physician.    Limitations Sitting;Standing;Walking;Lifting;House hold activities;Other (comment)   is dependent on assistance to do all ADLs, IADLs and ambulation (is independent with ADLs and ambulation at baseline).   Currently in Pain? No/denies           TREATMENT:  Neuromuscular Re-education:to improve, balance, postural strength, muscle activation patterns, and stabilization strength required for functional activities(close supervision, CGA, min A and mod A provided in all standing activities to prevent falls):  Standing exercises to improve balance and equilibrium (CGA - min A to prevent falls,patient counting out loud when able): - standing rows, firm surface, 1x10 with 15#, 1x10 5# cable. To improve equilibrium and coordination for stable standing with variable force applied. (Pt enjoyed but started to lean back maximally and had diffiuclty responding to cuing to stand up tall and move arms only. Did do better than at last session. Recommend attempt at earlier part of session or with single arm).  - standing trunk rotation holding theraband with both hands and moving towards PT's hand as a target. 1x10 each side with single red theraband, 1x10 each direction with single green theraband.   Standing in front of treadmill bars,  pt needs consistent cuing to use "no hands":  - standing horizontal head turns, cuing to look back and forth at two targets, 2x10 - standing vertical head nods, cuing to look back and forth at two targets, 2x10  - education with gladys on how to safely assist pt with HEP (that she feels comfortable with) at home. Education on HEP including handout and bands.  - ambulated ~ 100 feet to vehicle including  stepping down from curb with CGA for safety. Close supervision as he climbed up into the [place].  HOME EXERCISE PROGRAM Access Code: [email or web address] URL: [email or web address] Date: 01/12/2020 Prepared by: Norton Blizzard  Program Notes Patient needs guarding at all times and a target to pull the band towards or look towards   Exercises Standing Trunk Rotation with Resistance - 2 sets - 10 reps Standing with Head Rotation - 2 sets - 10 reps Standing with Head Nod - 2 sets - 10 reps      PT Education - 01/12/20 1510    Education Details Exercise form/purpose. discussed care with caregiver. HEP    Person(s) Educated Caregiver(s);Patient    Methods Explanation;Demonstration;Tactile cues;Verbal cues;Handout    Comprehension Verbalized understanding;Returned demonstration;Verbal cues required;Tactile cues required;Need further instruction            PT Short Term Goals - 12/03/19 1855      PT SHORT TERM GOAL #1   Title Be independent with initial home exercise program with minimal assistance for self-management of symptoms.    Baseline Initial HEP provided at IE (12/02/2019);             PT Long Term Goals -  12/03/19 1857      PT LONG TERM GOAL #1   Title Be independent at group home with a long-term home exercise program for self-management of symptoms.    Baseline initial HEP Provied at IE (12/02/2019);    Time 12    Period Weeks    Status New    Target Date --   TARGET DATE FOR ALL LONG TERM GOALS: 02/24/2020     PT LONG TERM GOAL #2   Title Improve 5 Time Sit To Stand Test to equal or less than 11 seconds to demonstrate improved functional strenght/power/coordination/balance for transfers.    Baseline 17 seconds from 18.5 inch plinth with no UE support. SBA for safety (12/02/2019);    Time 12    Period Weeks    Status New      PT LONG TERM GOAL #3   Title Improve BERG Balance Scale score to equal or greater than 52/56 to demonstrate lower (> 25%) fall risk.     Baseline 23/56 <36 = High risk for falls (12/02/2019);    Time 12    Period Weeks    Status New      PT LONG TERM GOAL #4   Title Patient/caregiver will report equal or less than one episode of imbalance per month to improve independence and decrease risk of falls.    Baseline one or two days every week to two weeks (12/02/2019);    Time 12    Period Weeks    Status New      PT LONG TERM GOAL #5   Title Complete community, work and/or recreational activities without limitation due to current condition.    Baseline limiting ability to complete his usual basic ADLs, household and community mobility, transfers, sitting, work/education social participation without difficulty (12/02/2019);    Time 12    Period Weeks    Status New                 Plan - 01/12/20 1517    Clinical Impression Statement Patient tolerated treatment well this session and showed a great improvement in balance. At one point during trunk rotations his pants fell down around his legs, exposing his breif, due to loose belt, so they were pulled back up and belt tightened. Patient did not appear concerned about this. Patient required CGA - min A guarding throughout session. Gladys felt confident she could do HEP safely with patein at home but did not want to try in the gym. Patient required max cuing to stay on task and was easily distracted. Attention span waned over session but overall he was able to respond to cuing to get back on task. Patient would benefit from continued management of limiting condition by skilled physical therapist to address remaining impairments and functional limitations to work towards stated goals and return to PLOF or maximal functional independence.    Personal Factors and Comorbidities Age;Comorbidity 3+;Behavior Pattern;Education;Past/Current Experience;Social Background;Other;Transportation;Time since onset of injury/illness/exacerbation   intellectual disability   Comorbidities intellectual  disability (lives in group home), double heart murmur (medically managed, severe mitral regurgitation), epilepsy (triggers unknown, has had very few since at this group home, usually at night, last was 2 months ago, caregiver has not seen them), reflux.    Examination-Activity Limitations Bed Mobility;Bathing;Hygiene/Grooming;Squat;Stairs;Lift;Bend;Locomotion Level;Stand;Toileting;Reach Overhead;Caring for Others;Carry;Transfers;Sit;Dressing;Other   is dependent on assistance to do all ADLs, IADLs and ambulation during episodes of imbalance (is independent with ADLs and ambulation at baseline).   Examination-Participation Restrictions Community Activity;Interpersonal Relationship;School;Other  work Engineer, maintenance (IT) Evolving/Moderate complexity    Rehab Potential Fair    PT Frequency 2x / week    PT Duration 12 weeks    PT Treatment/Interventions ADLs/Self Care Home Management;Cryotherapy;Gait training;Stair training;Functional mobility training;Therapeutic activities;Therapeutic exercise;Balance training;Neuromuscular re-education;Cognitive remediation;Patient/family education;Manual techniques;Passive range of motion;Vestibular;Visual/perceptual remediation/compensation    PT Next Visit Plan update HEP, balance training    PT Home Exercise Plan [facility] Access Code: [email or web address]    Consulted and Agree with Plan of Care Patient;Other (Comment)   [place]          Patient will benefit from skilled therapeutic intervention in order to improve the following deficits and impairments:  Abnormal gait, Decreased cognition, Decreased knowledge of use of DME, Dizziness, Improper body mechanics, Postural dysfunction, Decreased mobility, Decreased coordination, Decreased activity tolerance, Decreased strength, Decreased endurance, Impaired perceived functional ability, Difficulty walking, Decreased safety awareness, Decreased balance  Visit Diagnosis: Unsteadiness on  feet  Difficulty in walking, not elsewhere classified  Unspecified lack of coordination  Repeated falls     Problem List Patient Active Problem List   Diagnosis Date Noted  . Nose fracture 04/09/2016  . Epileptic seizures ([place]) 01/12/2015  . Intellectual disability   . OCD (obsessive compulsive disorder)   . Hyperlipidemia   . Epilepsy ([place])   . Systolic murmur     Nancy Nordmann 01/12/2020, 3:18 PM  [facility] [address] [place], [address] Phone: [phone]   Fax:  [phone]  Name: Raymond [name] MRN: [id number] Date of Birth: Sep 28, 1960

## 2020-01-13 ENCOUNTER — Encounter: Payer: Self-pay | Admitting: Physical Therapy

## 2020-01-13 ENCOUNTER — Ambulatory Visit: Payer: Medicare Other | Admitting: Physical Therapy

## 2020-01-13 DIAGNOSIS — R296 Repeated falls: Secondary | ICD-10-CM

## 2020-01-13 DIAGNOSIS — R2681 Unsteadiness on feet: Secondary | ICD-10-CM

## 2020-01-13 DIAGNOSIS — R262 Difficulty in walking, not elsewhere classified: Secondary | ICD-10-CM

## 2020-01-13 DIAGNOSIS — R279 Unspecified lack of coordination: Secondary | ICD-10-CM

## 2020-01-13 NOTE — Therapy (Signed)
[facility] [address] [place], [address] Phone: [phone]   Fax:  [phone]  Physical Therapy Treatment  Patient Details  Name: Raymond [name] MRN: [id number] Date of Birth: November 23, 1960 Referring Provider (PT): Raymond Books, MD   Encounter Date: 01/13/2020   PT End of Session - 01/13/20 1052    Visit Number 9    Number of Visits 24    Date for PT Re-Evaluation 02/24/20    Authorization Type Medicare reporting period from 12/02/2019    Authorization Time Period n/a    Authorization - Visit Number 9    Authorization - Number of Visits 10    Progress Note Due on Visit 10    PT Start Time 0947    PT Stop Time 1026    PT Time Calculation (min) 39 min    Equipment Utilized During Treatment Gait belt    Activity Tolerance No increased pain;Patient tolerated treatment well    Behavior During Therapy [facility] for tasks assessed/performed;Flat affect   short attention span          Past Medical History:  Diagnosis Date  . Epilepsy ([place])   . Epileptic seizures ([place])   . Hyperlipidemia   . Mental retardation   . OCD (obsessive compulsive disorder)    water drinking  . OCD (obsessive compulsive disorder)   . Severe mitral regurgitation    a. 02/2016 Echo: EF 55-60%, no rwma, MVP of posterior leaflet w/ an anteriorly directed MR - severe. Sev dil LA; b. TTE 8/18: EF of 55-60%, normal wall motion, normal LV diastolic function parameters, severe mitral prolapse involving the anterior leaflet and posterior leaflet with severe regurgitation directed anteriorly, severely dilated left atrium, unable to estimate the PASP  . Systolic murmur     Past Surgical History:  Procedure Laterality Date  . COLONOSCOPY WITH PROPOFOL N/A 03/23/2019   Procedure: COLONOSCOPY WITH PROPOFOL;  Surgeon: Toledo, Benay Pike, MD;  Location: [facility] ENDOSCOPY;  Service: Gastroenterology;  Laterality: N/A;  . ESOPHAGOGASTRODUODENOSCOPY (EGD)  WITH PROPOFOL N/A 03/23/2019   Procedure: ESOPHAGOGASTRODUODENOSCOPY (EGD) WITH PROPOFOL;  Surgeon: Toledo, Benay Pike, MD;  Location: [facility] ENDOSCOPY;  Service: Gastroenterology;  Laterality: N/A;  NEEDS A RAPID    There were no vitals filed for this visit.   Subjective Assessment - 01/13/20 1049    Subjective Patient is here with cargiver, Quita Skye. Patient reports no pain or feeling of being "weebly". Caregiver comfirms no falls since last session. Reports he appears to be losing weight and she feels that he is eating as before. Notes he has gastrointestinal issuse and double heart murmur. Is getting in touch with medical management team about weight loss. Has gone down 1-2 pant sizes. Yesterday, his other caregiver, [name], felt he had not been eating as much as usual when with her. Quita Skye reports she does not think [name] has practiced HEP with patient but is planning to when she is with him again. She did not share the HEP with Quita Skye. Quita Skye confirms that she does not feel she is able to assist with the HEP.    Patient is accompained by: --   Caregiver   Pertinent History Patient is a 59 y.o. male who presents to outpatient physical therapy with a referral for medical diagnosis imbalance. This patient's chief complaints consist of episodes of imbalance affecting him in sitting and standing positions leading to the following functional deficits: is dependent on assistance to do all ADLs, IADLs and  ambulation (is independent with ADLs and ambulation at baseline). Usually must stay in bed until imbalance improves. Relevant past medical history and comorbidities include intellectual disability (lives in group home), double heart murmur (medically managed, severe mitral regurgitation), epilepsy (triggers unknown, has had very few since at this group home, usually at night, last was 2 months ago, caregiver has not seen them), reflux.  Patient denies hx of cancer, stroke, lung problems, major cardiac events (besides  heart murmurs), diabetes,changes in bowel or bladder problems, new onset stumbling or dropping things apart from described below. Caregiver reports some unexplained weight loss a little that has improved with adding ensure and is monitored by physician.    Limitations Sitting;Standing;Walking;Lifting;House hold activities;Other (comment)   is dependent on assistance to do all ADLs, IADLs and ambulation (is independent with ADLs and ambulation at baseline).   Currently in Pain? No/denies           TREATMENT:  Neuromuscular Re-education:to improve, balance, postural strength, muscle activation patterns, and stabilization strength required for functional activities(close supervision, CGA, min A and mod A provided in all standing activities to prevent falls):  Standing exercises to improve balance and equilibrium (CGA - min A to prevent falls,patient counting out loud when able): - standing single arm rows, firm surface, 1x10 with red theraband, 1x10 with green theraband.  To improve equilibrium and coordination for stable standing with variable force applied. (improved control of backwards lean).  - standing trunk rotation holding theraband with both hands and moving towards PT's hand as a target. 1x10 each side with single red theraband, 1x10 each direction with single green theraband.   Standing in front of treadmill bars,  pt needs consistent cuing to use "no hands":  - standinghorizontal head turns, cuing to look back and forth at two targets,x10 -standing vertical head nods,cuing to look back and forth at two targets, 1x10  - suitcase carry with 6# DB 2x100 feet each side to improve trunk control (improved trunk control bilateral sides).  Seated at edge of armed chair for improved trunk control, using small pink theraball (exchanging ball with the PT, attempting to not use UE for support - > 90% of time) - ball toss 2x10 - ball kicks mostly using R foot x 20 - bounce pass ball  passes x 10  Sit <> stand from 18 inch chair holding 6# DB with both hands. 1x10. Struggled with last few reps apparently due to LE weakness/fatigue but seemed to enjoy exercise. Required cuing to keep holding the weight and not let go to use hand to push up.  - ambulated ~ 100 feet to vehicle including stepping down from curb with CGA for safety. Close supervision as he climbed up into the [place].  HOME EXERCISE PROGRAM Access Code: [email or web address] URL: [email or web address] Date: 01/12/2020 Prepared by: Norton Blizzard  Program Notes Patient needs guarding at all times and a target to pull the band towards or look towards   Exercises Standing Trunk Rotation with Resistance - 2 sets - 10 reps Standing with Head Rotation - 2 sets - 10 reps Standing with Head Nod - 2 sets - 10 reps     PT Education - 01/13/20 1052    Education Details Exercise form/purpose. discussed care with caregiver    Person(s) Educated Patient    Methods Explanation;Demonstration;Tactile cues;Verbal cues    Comprehension Verbalized understanding;Returned demonstration;Verbal cues required;Tactile cues required;Need further instruction            PT Short Term Goals - 12/03/19  1855      PT SHORT TERM GOAL #1   Title Be independent with initial home exercise program with minimal assistance for self-management of symptoms.    Baseline Initial HEP provided at IE (12/02/2019);             PT Long Term Goals - 12/03/19 1857      PT LONG TERM GOAL #1   Title Be independent at group home with a long-term home exercise program for self-management of symptoms.    Baseline initial HEP Provied at IE (12/02/2019);    Time 12    Period Weeks    Status New    Target Date --   TARGET DATE FOR ALL LONG TERM GOALS: 02/24/2020     PT LONG TERM GOAL #2   Title Improve 5 Time Sit To Stand Test to equal or less than 11 seconds to demonstrate improved functional strenght/power/coordination/balance for transfers.     Baseline 17 seconds from 18.5 inch plinth with no UE support. SBA for safety (12/02/2019);    Time 12    Period Weeks    Status New      PT LONG TERM GOAL #3   Title Improve BERG Balance Scale score to equal or greater than 52/56 to demonstrate lower (> 25%) fall risk.    Baseline 23/56 <36 = High risk for falls (12/02/2019);    Time 12    Period Weeks    Status New      PT LONG TERM GOAL #4   Title Patient/caregiver will report equal or less than one episode of imbalance per month to improve independence and decrease risk of falls.    Baseline one or two days every week to two weeks (12/02/2019);    Time 12    Period Weeks    Status New      PT LONG TERM GOAL #5   Title Complete community, work and/or recreational activities without limitation due to current condition.    Baseline limiting ability to complete his usual basic ADLs, household and community mobility, transfers, sitting, work/education social participation without difficulty (12/02/2019);    Time 12    Period Weeks    Status New                 Plan - 01/13/20 1102    Clinical Impression Statement Patient tolerated treatment well overall with no reports of pain. Session was paused several times to clean drool off face and change wet mask. Patient did seem to have some congestion in the throat or mouth from excessive secretions that affected his speaking and could be heard with breathing but had no wheezing or lower respiratory symptoms. Required consistent cuing to stay on task and struggled with attention. Was able to be redirected with frequent cuing. Did demo increased lean to right compared to yesterday but was able to correct in suitcase carry and sit <> stand. Does well with frequent changes in aspects of task to hold attention. Struggled with head turns on airex, so moved to flat ground. Patient would benefit from continued management of limiting condition by skilled physical therapist to address remaining  impairments and functional limitations to work towards stated goals and return to PLOF or maximal functional independence.    Personal Factors and Comorbidities Age;Comorbidity 3+;Behavior Pattern;Education;Past/Current Experience;Social Background;Other;Transportation;Time since onset of injury/illness/exacerbation   intellectual disability   Comorbidities intellectual disability (lives in group home), double heart murmur (medically managed, severe mitral regurgitation), epilepsy (triggers unknown, has had  very few since at this group home, usually at night, last was 2 months ago, caregiver has not seen them), reflux.    Examination-Activity Limitations Bed Mobility;Bathing;Hygiene/Grooming;Squat;Stairs;Lift;Bend;Locomotion Level;Stand;Toileting;Reach Overhead;Caring for Others;Carry;Transfers;Sit;Dressing;Other   is dependent on assistance to do all ADLs, IADLs and ambulation during episodes of imbalance (is independent with ADLs and ambulation at baseline).   Examination-Participation Restrictions Community Activity;Interpersonal Relationship;School;Other   work Museum/gallery curator Evolving/Moderate complexity    Rehab Potential Fair    PT Frequency 2x / week    PT Duration 12 weeks    PT Treatment/Interventions ADLs/Self Care Home Management;Cryotherapy;Gait training;Stair training;Functional mobility training;Therapeutic activities;Therapeutic exercise;Balance training;Neuromuscular re-education;Cognitive remediation;Patient/family education;Manual techniques;Passive range of motion;Vestibular;Visual/perceptual remediation/compensation    PT Next Visit Plan functional and balance training as tolerated    PT Home Exercise Plan [facility] Access Code: [email or web address]    Consulted and Agree with Plan of Care Patient;Other (Comment)   [place]          Patient will benefit from skilled therapeutic intervention in order to improve the following deficits and impairments:  Abnormal  gait, Decreased cognition, Decreased knowledge of use of DME, Dizziness, Improper body mechanics, Postural dysfunction, Decreased mobility, Decreased coordination, Decreased activity tolerance, Decreased strength, Decreased endurance, Impaired perceived functional ability, Difficulty walking, Decreased safety awareness, Decreased balance  Visit Diagnosis: Unsteadiness on feet  Difficulty in walking, not elsewhere classified  Unspecified lack of coordination  Repeated falls     Problem List Patient Active Problem List   Diagnosis Date Noted  . Nose fracture 04/09/2016  . Epileptic seizures ([facility]) 01/12/2015  . Intellectual disability   . OCD (obsessive compulsive disorder)   . Hyperlipidemia   . Epilepsy ([facility])   . Systolic murmur     Luretha Murphy. Ilsa Iha, PT, DPT 01/13/20, 11:04 AM  [facility] [address], [place], [address] Phone: [phone]   Fax:  [phone]  Name: Raymond [name] MRN: [id number] Date of Birth: [birth year]

## 2020-01-14 ENCOUNTER — Encounter: Payer: Medicare Other | Admitting: Physical Therapy

## 2020-01-28 ENCOUNTER — Ambulatory Visit: Payer: Medicare Other | Admitting: Physical Therapy

## 2020-02-03 ENCOUNTER — Encounter: Payer: Self-pay | Admitting: Physical Therapy

## 2020-02-03 ENCOUNTER — Encounter: Payer: Medicare Other | Admitting: Physical Therapy

## 2020-02-03 DIAGNOSIS — R262 Difficulty in walking, not elsewhere classified: Secondary | ICD-10-CM

## 2020-02-03 DIAGNOSIS — R279 Unspecified lack of coordination: Secondary | ICD-10-CM

## 2020-02-03 DIAGNOSIS — R296 Repeated falls: Secondary | ICD-10-CM

## 2020-02-03 DIAGNOSIS — R2681 Unsteadiness on feet: Secondary | ICD-10-CM

## 2020-02-03 NOTE — Therapy (Signed)
[facility] [address] [place], [address] Phone: [phone]   Fax:  [phone]  Physical Therapy No-Visit Discharge Reporting Period: 12/02/2019 - 02/03/2020  Patient Details  Name: Raymond [name] MRN: [id number] Date of Birth: [birth year] Referring Provider (PT): Jennings Books, MD   Encounter Date: 02/03/2020    Past Medical History:  Diagnosis Date  . Epilepsy ([place])   . Epileptic seizures ([place])   . Hyperlipidemia   . Mental retardation   . OCD (obsessive compulsive disorder)    water drinking  . OCD (obsessive compulsive disorder)   . Severe mitral regurgitation    a. 02/2016 Echo: EF 55-60%, no rwma, MVP of posterior leaflet w/ an anteriorly directed MR - severe. Sev dil LA; b. TTE 8/18: EF of 55-60%, normal wall motion, normal LV diastolic function parameters, severe mitral prolapse involving the anterior leaflet and posterior leaflet with severe regurgitation directed anteriorly, severely dilated left atrium, unable to estimate the PASP  . Systolic murmur     Past Surgical History:  Procedure Laterality Date  . COLONOSCOPY WITH PROPOFOL N/A 03/23/2019   Procedure: COLONOSCOPY WITH PROPOFOL;  Surgeon: Toledo, Benay Pike, MD;  Location: [facility] ENDOSCOPY;  Service: Gastroenterology;  Laterality: N/A;  . ESOPHAGOGASTRODUODENOSCOPY (EGD) WITH PROPOFOL N/A 03/23/2019   Procedure: ESOPHAGOGASTRODUODENOSCOPY (EGD) WITH PROPOFOL;  Surgeon: Toledo, Benay Pike, MD;  Location: [facility] ENDOSCOPY;  Service: Gastroenterology;  Laterality: N/A;  NEEDS A RAPID    There were no vitals filed for this visit.   Subjective Assessment - 02/03/20 1250    Subjective [facility] called and cancelled all appts for Raymond [name]. They said his health is declining and he just didn't need to come to any more appts.    Patient is accompained by: --   Caregiver   Pertinent History Patient is a 59 y.o. male who presents to outpatient physical  therapy with a referral for medical diagnosis imbalance. This patient's chief complaints consist of episodes of imbalance affecting him in sitting and standing positions leading to the following functional deficits: is dependent on assistance to do all ADLs, IADLs and ambulation (is independent with ADLs and ambulation at baseline). Usually must stay in bed until imbalance improves. Relevant past medical history and comorbidities include intellectual disability (lives in group home), double heart murmur (medically managed, severe mitral regurgitation), epilepsy (triggers unknown, has had very few since at this group home, usually at night, last was 2 months ago, caregiver has not seen them), reflux.  Patient denies hx of cancer, stroke, lung problems, major cardiac events (besides heart murmurs), diabetes,changes in bowel or bladder problems, new onset stumbling or dropping things apart from described below. Caregiver reports some unexplained weight loss a little that has improved with adding ensure and is monitored by physician.    Limitations Sitting;Standing;Walking;Lifting;House hold activities;Other (comment)   is dependent on assistance to do all ADLs, IADLs and ambulation (is independent with ADLs and ambulation at baseline).           OBJECTIVE Patient is not present for examination at this time. Please see previous documentation for latest objective data.       PT Short Term Goals - 02/03/20 1252      PT SHORT TERM GOAL #1   Title Be independent with initial home exercise program with minimal assistance for self-management of symptoms.    Baseline Initial HEP provided at IE (12/02/2019); provided to caregiver Raymond [name]    Status Partially Met  PT Long Term Goals - 02/03/20 1252      PT LONG TERM GOAL #1   Title Be independent at group home with a long-term home exercise program for self-management of symptoms.    Baseline initial HEP Provied at IE (12/02/2019);    Time 12      Period Weeks    Status Not Met    Target Date --   TARGET DATE FOR ALL LONG TERM GOALS: 02/24/2020     PT LONG TERM GOAL #2   Title Improve 5 Time Sit To Stand Test to equal or less than 11 seconds to demonstrate improved functional strenght/power/coordination/balance for transfers.    Baseline 17 seconds from 18.5 inch plinth with no UE support. SBA for safety (12/02/2019);    Time 12    Period Weeks    Status Not Met      PT LONG TERM GOAL #3   Title Improve BERG Balance Scale score to equal or greater than 52/56 to demonstrate lower (> 25%) fall risk.    Baseline 23/56 <36 = High risk for falls (12/02/2019);    Time 12    Period Weeks    Status Not Met      PT LONG TERM GOAL #4   Title Patient/caregiver will report equal or less than one episode of imbalance per month to improve independence and decrease risk of falls.    Baseline one or two days every week to two weeks (12/02/2019);    Time 12    Period Weeks    Status Not Met      PT LONG TERM GOAL #5   Title Complete community, work and/or recreational activities without limitation due to current condition.    Baseline limiting ability to complete his usual basic ADLs, household and community mobility, transfers, sitting, work/education social participation without difficulty (12/02/2019);    Time 12    Period Weeks    Status Not Met             Plan - 02/03/20 1256    Clinical Impression Statement Patient attended 9 physical therapy sessions. He did not make much progress towards goals related to the underlying causes of his condition being most likely multifaceted and related to brain atrophy and history of mental impairment. Focus of treatment was to improve quality of life, slow further deterioration, and possibly improve function. Patient's caregivers called and canceled future sessions due to decline in patient's overall health. He is therefore, discharged from PT at this time.    Personal Factors and Comorbidities  Age;Comorbidity 3+;Behavior Pattern;Education;Past/Current Experience;Social Background;Other;Transportation;Time since onset of injury/illness/exacerbation   intellectual disability   Comorbidities intellectual disability (lives in group home), double heart murmur (medically managed, severe mitral regurgitation), epilepsy (triggers unknown, has had very few since at this group home, usually at night, last was 2 months ago, caregiver has not seen them), reflux.    Examination-Activity Limitations Bed Mobility;Bathing;Hygiene/Grooming;Squat;Stairs;Lift;Bend;Locomotion Level;Stand;Toileting;Reach Overhead;Caring for Others;Carry;Transfers;Sit;Dressing;Other   is dependent on assistance to do all ADLs, IADLs and ambulation during episodes of imbalance (is independent with ADLs and ambulation at baseline).   Examination-Participation Restrictions Community Activity;Interpersonal Relationship;School;Other   work Engineer, maintenance (IT) Evolving/Moderate complexity    Rehab Potential Fair    PT Frequency 2x / week    PT Duration 12 weeks    PT Treatment/Interventions ADLs/Self Care Home Management;Cryotherapy;Gait training;Stair training;Functional mobility training;Therapeutic activities;Therapeutic exercise;Balance training;Neuromuscular re-education;Cognitive remediation;Patient/family education;Manual techniques;Passive range of motion;Vestibular;Visual/perceptual remediation/compensation    PT Next Visit  Plan Patient is now discharged from [facility] Access Code: [email or web address]    Consulted and Agree with Plan of Care Patient;Other (Comment)   [place]          Patient will benefit from skilled therapeutic intervention in order to improve the following deficits and impairments:  Abnormal gait, Decreased cognition, Decreased knowledge of use of DME, Dizziness, Improper body mechanics, Postural dysfunction, Decreased mobility, Decreased coordination, Decreased  activity tolerance, Decreased strength, Decreased endurance, Impaired perceived functional ability, Difficulty walking, Decreased safety awareness, Decreased balance  Visit Diagnosis: Unsteadiness on feet  Difficulty in walking, not elsewhere classified  Unspecified lack of coordination  Repeated falls     Problem List Patient Active Problem List   Diagnosis Date Noted  . Nose fracture 04/09/2016  . Epileptic seizures ([place]) 01/12/2015  . Intellectual disability   . OCD (obsessive compulsive disorder)   . Hyperlipidemia   . Epilepsy ([place])   . Systolic murmur     Everlean Alstrom. Graylon Good, PT, DPT 02/03/20, 12:58 PM   [facility] [address] [place], [address] Phone: [phone]   Fax:  [phone]  Name: Raymond [name] MRN: [id number] Date of Birth: [birth year]

## 2020-02-04 ENCOUNTER — Ambulatory Visit: Payer: Medicare Other | Admitting: Physical Therapy

## 2020-02-10 ENCOUNTER — Encounter: Payer: Medicare Other | Admitting: Physical Therapy

## 2020-02-17 ENCOUNTER — Encounter: Payer: Medicare Other | Admitting: Physical Therapy

## 2020-02-24 ENCOUNTER — Encounter: Payer: Medicare Other | Admitting: Physical Therapy

## 2020-02-29 ENCOUNTER — Encounter: Payer: Medicare Other | Admitting: Physical Therapy

## 2020-02-29 ENCOUNTER — Other Ambulatory Visit: Payer: Self-pay

## 2020-02-29 ENCOUNTER — Encounter: Payer: Self-pay | Admitting: Emergency Medicine

## 2020-02-29 ENCOUNTER — Inpatient Hospital Stay
Admission: EM | Admit: 2020-02-29 | Discharge: 2020-03-10 | DRG: 291 | Disposition: A | Discharge status: Home Health | Payer: Medicare Other | Attending: Internal Medicine | Admitting: Internal Medicine

## 2020-02-29 DIAGNOSIS — F79 Unspecified intellectual disabilities: Secondary | ICD-10-CM

## 2020-02-29 DIAGNOSIS — Z7189 Other specified counseling: Secondary | ICD-10-CM

## 2020-02-29 DIAGNOSIS — I429 Cardiomyopathy, unspecified: Secondary | ICD-10-CM | POA: Diagnosis present

## 2020-02-29 DIAGNOSIS — Z79899 Other long term (current) drug therapy: Secondary | ICD-10-CM

## 2020-02-29 DIAGNOSIS — Z20822 Contact with and (suspected) exposure to covid-19: Secondary | ICD-10-CM | POA: Diagnosis present

## 2020-02-29 DIAGNOSIS — E43 Unspecified severe protein-calorie malnutrition: Secondary | ICD-10-CM | POA: Insufficient documentation

## 2020-02-29 DIAGNOSIS — N179 Acute kidney failure, unspecified: Secondary | ICD-10-CM | POA: Diagnosis not present

## 2020-02-29 DIAGNOSIS — E785 Hyperlipidemia, unspecified: Secondary | ICD-10-CM | POA: Diagnosis present

## 2020-02-29 DIAGNOSIS — F72 Severe intellectual disabilities: Secondary | ICD-10-CM | POA: Diagnosis present

## 2020-02-29 DIAGNOSIS — I10 Essential (primary) hypertension: Secondary | ICD-10-CM | POA: Diagnosis present

## 2020-02-29 DIAGNOSIS — R569 Unspecified convulsions: Secondary | ICD-10-CM

## 2020-02-29 DIAGNOSIS — I4891 Unspecified atrial fibrillation: Secondary | ICD-10-CM | POA: Diagnosis present

## 2020-02-29 DIAGNOSIS — R000 Tachycardia, unspecified: Secondary | ICD-10-CM

## 2020-02-29 DIAGNOSIS — R05 Cough: Secondary | ICD-10-CM

## 2020-02-29 DIAGNOSIS — Z681 Body mass index (BMI) 19 or less, adult: Secondary | ICD-10-CM

## 2020-02-29 DIAGNOSIS — J810 Acute pulmonary edema: Secondary | ICD-10-CM

## 2020-02-29 DIAGNOSIS — Z515 Encounter for palliative care: Secondary | ICD-10-CM

## 2020-02-29 DIAGNOSIS — R531 Weakness: Secondary | ICD-10-CM

## 2020-02-29 DIAGNOSIS — I5033 Acute on chronic diastolic (congestive) heart failure: Secondary | ICD-10-CM | POA: Diagnosis present

## 2020-02-29 DIAGNOSIS — E781 Pure hyperglyceridemia: Secondary | ICD-10-CM

## 2020-02-29 DIAGNOSIS — F429 Obsessive-compulsive disorder, unspecified: Secondary | ICD-10-CM | POA: Diagnosis present

## 2020-02-29 DIAGNOSIS — G40409 Other generalized epilepsy and epileptic syndromes, not intractable, without status epilepticus: Secondary | ICD-10-CM | POA: Diagnosis present

## 2020-02-29 DIAGNOSIS — N182 Chronic kidney disease, stage 2 (mild): Secondary | ICD-10-CM | POA: Diagnosis present

## 2020-02-29 DIAGNOSIS — I130 Hypertensive heart and chronic kidney disease with heart failure and stage 1 through stage 4 chronic kidney disease, or unspecified chronic kidney disease: Principal | ICD-10-CM | POA: Diagnosis present

## 2020-02-29 DIAGNOSIS — Z7982 Long term (current) use of aspirin: Secondary | ICD-10-CM

## 2020-02-29 DIAGNOSIS — G40909 Epilepsy, unspecified, not intractable, without status epilepticus: Secondary | ICD-10-CM

## 2020-02-29 DIAGNOSIS — I340 Nonrheumatic mitral (valve) insufficiency: Secondary | ICD-10-CM

## 2020-02-29 DIAGNOSIS — R159 Full incontinence of feces: Secondary | ICD-10-CM | POA: Diagnosis present

## 2020-02-29 DIAGNOSIS — N189 Chronic kidney disease, unspecified: Secondary | ICD-10-CM

## 2020-02-29 DIAGNOSIS — R059 Cough, unspecified: Secondary | ICD-10-CM

## 2020-02-29 DIAGNOSIS — K219 Gastro-esophageal reflux disease without esophagitis: Secondary | ICD-10-CM | POA: Diagnosis present

## 2020-02-29 DIAGNOSIS — I959 Hypotension, unspecified: Secondary | ICD-10-CM

## 2020-02-29 DIAGNOSIS — R4701 Aphasia: Secondary | ICD-10-CM | POA: Diagnosis present

## 2020-02-29 LAB — COMPREHENSIVE METABOLIC PANEL
ALT: 13 U/L (ref 0–44)
AST: 21 U/L (ref 15–41)
Albumin: 3.7 g/dL (ref 3.5–5.0)
Alkaline Phosphatase: 21 U/L — ABNORMAL LOW (ref 38–126)
Anion gap: 9 (ref 5–15)
BUN: 17 mg/dL (ref 6–20)
CO2: 20 mmol/L — ABNORMAL LOW (ref 22–32)
Calcium: 8.4 mg/dL — ABNORMAL LOW (ref 8.9–10.3)
Chloride: 107 mmol/L (ref 98–111)
Creatinine, Ser: 1.05 mg/dL (ref 0.61–1.24)
GFR calc Af Amer: >60 mL/min (ref >60)
GFR calc non Af Amer: >60 mL/min (ref >60)
Glucose, Bld: 103 mg/dL — ABNORMAL HIGH (ref 70–99)
Potassium: 4.7 mmol/L (ref 3.5–5.1)
Sodium: 136 mmol/L (ref 135–145)
Total Bilirubin: 0.7 mg/dL (ref 0.3–1.2)
Total Protein: 6.2 g/dL — ABNORMAL LOW (ref 6.5–8.1)

## 2020-02-29 LAB — CBC
HCT: 32.1 % — ABNORMAL LOW (ref 39.0–52.0)
Hemoglobin: 10.9 g/dL — ABNORMAL LOW (ref 13.0–17.0)
MCH: 31.9 pg (ref 26.0–34.0)
MCHC: 34 g/dL (ref 30.0–36.0)
MCV: 93.9 fL (ref 80.0–100.0)
Platelets: 160 10*3/uL (ref 150–400)
RBC: 3.42 MIL/uL — ABNORMAL LOW (ref 4.22–5.81)
RDW: 15.9 % — ABNORMAL HIGH (ref 11.5–15.5)
WBC: 6.4 10*3/uL (ref 4.0–10.5)
nRBC: 0 % (ref 0.0–0.2)

## 2020-02-29 LAB — CARBAMAZEPINE LEVEL, TOTAL: Carbamazepine Lvl: <2 ug/mL — ABNORMAL LOW (ref 4.0–12.0)

## 2020-02-29 LAB — VALPROIC ACID LEVEL: Valproic Acid Lvl: 134 ug/mL — ABNORMAL HIGH (ref 50.0–100.0)

## 2020-02-29 MED ORDER — SODIUM CHLORIDE 0.9 % IV BOLUS
1000.0000 mL | Freq: Once | INTRAVENOUS | Status: AC
  Administered 2020-02-29: 1000 mL via INTRAVENOUS

## 2020-02-29 MED ORDER — DILTIAZEM HCL 25 MG/5ML IV SOLN
10.0000 mg | Freq: Once | INTRAVENOUS | Status: AC
  Administered 2020-02-29: 10 mg via INTRAVENOUS
  Filled 2020-02-29: qty 5

## 2020-02-29 NOTE — ED Provider Notes (Signed)
[facility] Emergency Department Provider Note  Time seen: 10:19 PM  I have reviewed the triage vital signs and the nursing notes.   HISTORY  Chief Complaint Seizures   HPI AURTHER HARLIN is a 59 y.o. male with a past mental history of mental retardation, epilepsy, hyperlipidemia, presents to the emergency department after a possible seizure.  According to EMS report patient was at his group facility when his roommate noted the patient to be shaking.  EMS was called.  EMS states upon their arrival patient appeared to be very confused and not very responsive.  During the patient's ride to the hospital he became more alert and responsive, they were told at baseline patient is nonverbal however.   Patient currently is awake, will look at you but is not answering questions or following commands.  Awaiting family arrival for further history.  Past Medical History:  Diagnosis Date  . Epilepsy (HCC)   . Epileptic seizures (HCC)   . Hyperlipidemia   . Mental retardation   . OCD (obsessive compulsive disorder)    water drinking  . OCD (obsessive compulsive disorder)   . Severe mitral regurgitation    a. 02/2016 Echo: EF 55-60%, no rwma, MVP of posterior leaflet w/ an anteriorly directed MR - severe. Sev dil LA; b. TTE 8/18: EF of 55-60%, normal wall motion, normal LV diastolic function parameters, severe mitral prolapse involving the anterior leaflet and posterior leaflet with severe regurgitation directed anteriorly, severely dilated left atrium, unable to estimate the PASP  . Systolic murmur     Patient Active Problem List   Diagnosis Date Noted  . Nose fracture 04/09/2016  . Epileptic seizures (HCC) 01/12/2015  . Intellectual disability   . OCD (obsessive compulsive disorder)   . Hyperlipidemia   . Epilepsy (HCC)   . Systolic murmur     Past Surgical History:  Procedure Laterality Date  . COLONOSCOPY WITH PROPOFOL N/A 03/23/2019   Procedure: COLONOSCOPY  WITH PROPOFOL;  Surgeon: Toledo, Boykin Nearing, MD;  Location: [facility] ENDOSCOPY;  Service: Gastroenterology;  Laterality: N/A;  . ESOPHAGOGASTRODUODENOSCOPY (EGD) WITH PROPOFOL N/A 03/23/2019   Procedure: ESOPHAGOGASTRODUODENOSCOPY (EGD) WITH PROPOFOL;  Surgeon: Toledo, Boykin Nearing, MD;  Location: [facility] ENDOSCOPY;  Service: Gastroenterology;  Laterality: N/A;  NEEDS A RAPID    Prior to Admission medications   Medication Sig Start Date End Date Taking? Authorizing Provider  albuterol (PROVENTIL HFA;VENTOLIN HFA) 108 (90 Base) MCG/ACT inhaler Inhale 2 puffs into the lungs every 6 (six) hours as needed for wheezing or shortness of breath. 07/26/16   Particia Nearing, PA-C  aspirin EC 81 MG tablet Take 81 mg by mouth daily.    [provider]  carbamazepine (TEGRETOL) 200 MG tablet Take 1 tablet (200 mg total) by mouth 3 (three) times daily. 02/27/17   Steele Sizer, MD  carvedilol (COREG) 3.125 MG tablet Take 3.125 mg by mouth 2 (two) times daily with a meal.    [provider]  Cholecalciferol (VITAMIN D) 50 MCG (2000 UT) CAPS Take 2,000 Units by mouth daily.     [provider]  Cyanocobalamin (VITAMIN B 12 PO) Take 1,000 mcg by mouth daily.     [provider]  divalproex (DEPAKOTE) 500 MG DR tablet TAKE 1 TABLET BY MOUTH THREE TIMES DAILY WITH MEALS. 07/02/16   Steele Sizer, MD  Ergocalciferol (VITAMIN D2 PO) Take 1.25 mg by mouth once a week.    [provider]  fenofibrate (TRICOR) 145 MG  tablet TAKE 1 TABLET BY MOUTH ONCE DAILY FOR LIPIDS. 01/02/17   Steele Sizer, MD  fluticasone (FLONASE) 50 MCG/ACT nasal spray Place 2 sprays into both nostrils daily. Use PRN 02/23/16   Steele Sizer, MD  guaiFENesin (MUCINEX) 600 MG 12 hr tablet Take 1 tablet (600 mg total) by mouth 2 (two) times daily as needed. 04/24/18   Johnson, Megan P, DO  loratadine (CLARITIN) 10 MG tablet TAKE 1 TABLET BY MOUTH ONCE DAILY. 04/02/17   Johnson, Megan P, DO  losartan  (COZAAR) 25 MG tablet Take 25 mg by mouth daily.    [provider]  meclizine (ANTIVERT) 25 MG tablet Take 25 mg by mouth 3 (three) times daily as needed for dizziness.    [provider]  Multiple Vitamin (MULTIVITAMIN WITH MINERALS) TABS tablet Take 1 tablet by mouth daily.    [provider]  Nutritional Supplements (ENSURE PO) Take 1 Can by mouth daily.    [provider]  omeprazole (PRILOSEC) 40 MG capsule Take 40 mg by mouth daily.    [provider]  risperiDONE (RISPERDAL) 1 MG tablet  11/27/17   [provider]  risperiDONE (RISPERDAL) 2 MG tablet  10/28/17   [provider]  Wheat Dextrin (BENEFIBER PO) Take by mouth daily. 2 ? in 8 oz QD    [provider]    No Known Allergies  Family History  Family history unknown: Yes    Social History Social History   Tobacco Use  . Smoking status: Never Smoker  . Smokeless tobacco: Never Used  Vaping Use  . Vaping Use: Never used  Substance Use Topics  . Alcohol use: No    Alcohol/week: 0.0 standard drinks  . Drug use: No    Review of Systems Unable to obtain adequate/accurate review of systems at this time secondary to baseline mental status and possible postictal state.  ____________________________________________   PHYSICAL EXAM:  VITAL SIGNS: ED Triage Vitals  Enc Vitals Group     BP 02/29/20 2209 107/85     Pulse Rate 02/29/20 2209 (!) 132     Resp 02/29/20 2209 (!) 24     Temp 02/29/20 2209 98.1 F (36.7 C)     Temp Source 02/29/20 2209 Axillary     SpO2 02/29/20 2209 100 %     Weight 02/29/20 2206 140 lb 6.9 oz (63.7 kg)     Height 02/29/20 2206 5\' 8"  (1.727 m)     Head Circumference --      Peak Flow --      Pain Score --      Pain Loc --      Pain Edu? --      Excl. in GC? --     Constitutional: Patient is awake.  Will occasionally look at you when you are talking him but does not follow commands or answer questions. Eyes: Normal  exam ENT      Head: Normocephalic and atraumatic.      Mouth/Throat: Mucous membranes are moist. Cardiovascular: Regular rhythm rate around 120 bpm. Respiratory: Normal respiratory effort without tachypnea nor retractions. Breath sounds are clear  Gastrointestinal: Soft and nontender. No distention.  No reaction to abdominal palpation. Musculoskeletal: Normal appearing extremities. Neurologic: Patient moves all extremities at times. Skin:  Skin is warm, dry   ____________________________________________    EKG  EKG viewed and interpreted by myself shows what looks most consistent with atrial fibrillation with rapid ventricular response at 157 bpm  with a narrow QRS, normal axis, largely normal intervals with nonspecific ST changes.  ____________________________________________   INITIAL IMPRESSION / ASSESSMENT AND PLAN / ED COURSE  Pertinent labs & imaging results that were available during my care of the patient were reviewed by me and considered in my medical decision making (see chart for details).   Patient presents emergency department after what appears to be a possible seizure at his group facility.  Per EMS report patient is nonverbal at baseline.  Patient is awake, looking to at times when you are talking to him but is not following commands or answering questions.  Currently awaiting family arrival for further history.  We will check labs and continue to closely monitor.  We will IV hydrate.  Patient is EKG does appear to show what could be atrial fibrillation with RVR.  We will dose IV fluids and continue to closely monitor.  No history of A. fib noted in patient's record.  Patient care signed out to Dr. York Cerise.  JERAY SHUGART was evaluated in Emergency Department on 02/29/2020 for the symptoms described in the history of present illness. He was evaluated in the context of the global COVID-19 pandemic, which necessitated consideration that the patient might be at risk for  infection with the SARS-CoV-2 virus that causes COVID-19. Institutional protocols and algorithms that pertain to the evaluation of patients at risk for COVID-19 are in a state of rapid change based on information released by regulatory bodies including the CDC and federal and state organizations. These policies and algorithms were followed during the patient's care in the ED.  ____________________________________________   FINAL CLINICAL IMPRESSION(S) / ED DIAGNOSES  Seizure   Minna Antis, MD 03/01/20 2311

## 2020-02-29 NOTE — ED Provider Notes (Signed)
-----------------------------------------   11:12 PM on 02/29/2020 -----------------------------------------  Assuming care from Dr. Lenard Lance.  In short, Raymond [name] is a 59 y.o. male with a chief complaint of seizure.  Refer to the original H&P for additional details.  The current plan of care is to follow up on UA and reassess.  Need to speak with mother when she arrives.   ----------------------------------------- 1:00 AM on 03/01/2020 -----------------------------------------  The patient's family is at bedside. The visitors are the patient's adopted brother and sister-in-law. They have been a part of his family for decades. They said that he is at his baseline and without complaint at this time.  I updated them regarding the generally reassuring results other than the elevated valproic acid level. They are comfortable with the plan for discharge and outpatient follow-up.  However because the group home administrator cannot come to get him into the morning, we will continue to observe him overnight for his safety as well as convenience for the group home.  I will recheck a timed lab for the valproic acid at about 5 AM to see if the level has come down with time and after IV fluids.  Of note, the report indicates that he received 500 mL normal saline prior to arrival by EMS and another 1 L in the emergency department.  I will hold off on additional fluids at this time.   ----------------------------------------- 7:27 AM on 03/01/2020 -----------------------------------------   (Note that documentation was delayed due to multiple ED patients requiring immediate care.)  In short, over the course of the night, the patient first had gotten less tachycardic but then became increasingly tachycardic and developed coarse breath sounds.  He would shake his head no when asked if he felt short of breath or felt bad, but his vital signs were abnormal.  Repeat Depakote level was within normal  limits.  However he became tachycardic as high as the 130s with a coarse breath sounds.  I ordered a chest x-ray and it is concerning for developing pulmonary edema.  He received a total of about 2 L of normal saline while in the emergency department and by EMS.  This should not normally cause pulmonary edema when administered over the course of about 9 hours.  I reviewed his medical record and saw that he had a relatively recent echocardiogram with a preserved ejection fraction of 55 to 60%.  His cardiologist was Dr. Kirke Corin.  Given his abnormal vitals and inability to provide any history himself, I admitted the patient to Dr. Para March with the hospitalist service after ordering furosemide 20 mg IV for his apparent acute heart failure.    Loleta Rose, MD 03/01/20 [phone]

## 2020-02-29 NOTE — ED Triage Notes (Signed)
Pt presents from [facility] group home via [facility] with c/o  seizures. Per facility, roommate witnessed patient having seizure like activity. Unknown length of seizure. When ems arrived pt was post-ictal. Currently patient is alert, but non-verbal. Per facility pt is non-verbal at baseline. cbg 117. Hx of grand-mal seizures. normal saline given in route.

## 2020-03-01 ENCOUNTER — Encounter: Payer: Self-pay | Admitting: Radiology

## 2020-03-01 ENCOUNTER — Emergency Department: Payer: Medicare Other

## 2020-03-01 ENCOUNTER — Observation Stay
Admit: 2020-03-01 | Discharge: 2020-03-01 | Disposition: A | Discharge status: Home / Self Care | Payer: Medicare Other | Attending: Internal Medicine | Admitting: Internal Medicine

## 2020-03-01 DIAGNOSIS — I5033 Acute on chronic diastolic (congestive) heart failure: Secondary | ICD-10-CM | POA: Diagnosis not present

## 2020-03-01 DIAGNOSIS — F429 Obsessive-compulsive disorder, unspecified: Secondary | ICD-10-CM

## 2020-03-01 DIAGNOSIS — I10 Essential (primary) hypertension: Secondary | ICD-10-CM | POA: Diagnosis present

## 2020-03-01 DIAGNOSIS — K219 Gastro-esophageal reflux disease without esophagitis: Secondary | ICD-10-CM | POA: Diagnosis not present

## 2020-03-01 DIAGNOSIS — G40009 Localization-related (focal) (partial) idiopathic epilepsy and epileptic syndromes with seizures of localized onset, not intractable, without status epilepticus: Secondary | ICD-10-CM | POA: Diagnosis not present

## 2020-03-01 DIAGNOSIS — R569 Unspecified convulsions: Secondary | ICD-10-CM

## 2020-03-01 DIAGNOSIS — I4891 Unspecified atrial fibrillation: Secondary | ICD-10-CM | POA: Diagnosis present

## 2020-03-01 DIAGNOSIS — G40909 Epilepsy, unspecified, not intractable, without status epilepticus: Secondary | ICD-10-CM

## 2020-03-01 DIAGNOSIS — E785 Hyperlipidemia, unspecified: Secondary | ICD-10-CM

## 2020-03-01 LAB — ECHOCARDIOGRAM COMPLETE
AR max vel: 0.73 cm2
AV Area VTI: 0.73 cm2
AV Area mean vel: 0.58 cm2
AV Mean grad: 15.7 mmHg
AV Peak grad: 24.5 mmHg
Ao pk vel: 2.48 m/s
Area-P 1/2: 4.36 cm2
Calc EF: 34.5 %
Height: 68 in
S' Lateral: 5.56 cm
Single Plane A2C EF: 30 %
Single Plane A4C EF: 41.1 %
Weight: 2246.93 oz

## 2020-03-01 LAB — BRAIN NATRIURETIC PEPTIDE: B Natriuretic Peptide: 550.7 pg/mL — ABNORMAL HIGH (ref 0.0–100.0)

## 2020-03-01 LAB — SARS CORONAVIRUS 2 BY RT PCR (HOSPITAL ORDER, PERFORMED IN ~~LOC~~ HOSPITAL LAB): SARS Coronavirus 2: NEGATIVE

## 2020-03-01 LAB — URINALYSIS, COMPLETE (UACMP) WITH MICROSCOPIC
Bacteria, UA: NONE SEEN
Bilirubin Urine: NEGATIVE
Glucose, UA: NEGATIVE mg/dL
Hgb urine dipstick: NEGATIVE
Ketones, ur: NEGATIVE mg/dL
Leukocytes,Ua: NEGATIVE
Nitrite: NEGATIVE
Protein, ur: NEGATIVE mg/dL
Specific Gravity, Urine: 1.011 (ref 1.005–1.030)
Squamous Epithelial / HPF: NONE SEEN (ref 0–5)
pH: 5 (ref 5.0–8.0)

## 2020-03-01 LAB — MRSA PCR SCREENING: MRSA by PCR: NEGATIVE

## 2020-03-01 LAB — VALPROIC ACID LEVEL: Valproic Acid Lvl: 82 ug/mL (ref 50.0–100.0)

## 2020-03-01 MED ORDER — FUROSEMIDE 10 MG/ML IJ SOLN
20.0000 mg | Freq: Once | INTRAMUSCULAR | Status: AC
  Administered 2020-03-01: 20 mg via INTRAVENOUS
  Filled 2020-03-01: qty 4

## 2020-03-01 MED ORDER — CARVEDILOL 3.125 MG PO TABS
3.1250 mg | ORAL_TABLET | Freq: Two times a day (BID) | ORAL | Status: DC
  Administered 2020-03-01 (×2): 3.125 mg via ORAL
  Filled 2020-03-01 (×2): qty 1

## 2020-03-01 MED ORDER — DIVALPROEX SODIUM 500 MG PO DR TAB
500.0000 mg | DELAYED_RELEASE_TABLET | Freq: Three times a day (TID) | ORAL | Status: DC
  Administered 2020-03-01 – 2020-03-10 (×26): 500 mg via ORAL
  Filled 2020-03-01 (×30): qty 1

## 2020-03-01 MED ORDER — SODIUM CHLORIDE 0.9 % IV BOLUS
150.0000 mL | Freq: Once | INTRAVENOUS | Status: AC
  Administered 2020-03-01: 150 mL via INTRAVENOUS

## 2020-03-01 MED ORDER — DILTIAZEM HCL-DEXTROSE 125-5 MG/125ML-% IV SOLN (PREMIX)
5.0000 mg/h | INTRAVENOUS | Status: DC
  Administered 2020-03-01: 5 mg/h via INTRAVENOUS
  Filled 2020-03-01: qty 125

## 2020-03-01 MED ORDER — RISPERIDONE 1 MG PO TABS
1.0000 mg | ORAL_TABLET | Freq: Every day | ORAL | Status: DC
  Administered 2020-03-01 – 2020-03-10 (×9): 1 mg via ORAL
  Filled 2020-03-01 (×10): qty 1

## 2020-03-01 MED ORDER — GUAIFENESIN ER 600 MG PO TB12: 600.0000 mg | ORAL_TABLET | Freq: Two times a day (BID) | ORAL | Status: DC | PRN

## 2020-03-01 MED ORDER — SODIUM CHLORIDE 0.9 % IV SOLN: 250.0000 mL | INTRAVENOUS | Status: DC | PRN

## 2020-03-01 MED ORDER — PSYLLIUM 95 % PO PACK
1.0000 | PACK | Freq: Every day | ORAL | Status: DC
  Administered 2020-03-01 – 2020-03-09 (×9): 1 via ORAL
  Filled 2020-03-01 (×10): qty 1

## 2020-03-01 MED ORDER — AMIODARONE IV BOLUS ONLY 150 MG/100ML
150.0000 mg | Freq: Once | INTRAVENOUS | Status: AC
  Administered 2020-03-02: 150 mg via INTRAVENOUS
  Filled 2020-03-01: qty 100

## 2020-03-01 MED ORDER — ALBUMIN HUMAN 25 % IV SOLN
25.0000 g | Freq: Once | INTRAVENOUS | Status: AC
  Administered 2020-03-01: 25 g via INTRAVENOUS
  Filled 2020-03-01: qty 100

## 2020-03-01 MED ORDER — FAMOTIDINE 20 MG PO TABS
40.0000 mg | ORAL_TABLET | Freq: Every day | ORAL | Status: DC
  Administered 2020-03-01 – 2020-03-09 (×9): 40 mg via ORAL
  Filled 2020-03-01 (×9): qty 2

## 2020-03-01 MED ORDER — ENOXAPARIN SODIUM 40 MG/0.4ML ~~LOC~~ SOLN
40.0000 mg | SUBCUTANEOUS | Status: DC
  Administered 2020-03-01 – 2020-03-10 (×9): 40 mg via SUBCUTANEOUS
  Filled 2020-03-01 (×10): qty 0.4

## 2020-03-01 MED ORDER — ALBUTEROL SULFATE (2.5 MG/3ML) 0.083% IN NEBU: 2.5000 mg | INHALATION_SOLUTION | Freq: Four times a day (QID) | RESPIRATORY_TRACT | Status: DC | PRN

## 2020-03-01 MED ORDER — SODIUM CHLORIDE 0.9% FLUSH
3.0000 mL | Freq: Two times a day (BID) | INTRAVENOUS | Status: DC
  Administered 2020-03-01 – 2020-03-07 (×10): 3 mL via INTRAVENOUS

## 2020-03-01 MED ORDER — BOOST PO LIQD
237.0000 mL | Freq: Every day | ORAL | Status: DC
  Filled 2020-03-01: qty 237

## 2020-03-01 MED ORDER — SODIUM CHLORIDE 0.9% FLUSH: 3.0000 mL | INTRAVENOUS | Status: DC | PRN

## 2020-03-01 MED ORDER — SODIUM CHLORIDE 0.9 % IV BOLUS
500.0000 mL | Freq: Once | INTRAVENOUS | Status: AC
  Administered 2020-03-01: 500 mL via INTRAVENOUS

## 2020-03-01 MED ORDER — CARBAMAZEPINE 200 MG PO TABS
300.0000 mg | ORAL_TABLET | Freq: Three times a day (TID) | ORAL | Status: DC
  Administered 2020-03-01: 300 mg via ORAL
  Filled 2020-03-01: qty 1.5
  Filled 2020-03-01: qty 2
  Filled 2020-03-01 (×2): qty 1.5
  Filled 2020-03-01: qty 2

## 2020-03-01 MED ORDER — LEVETIRACETAM 500 MG PO TABS
500.0000 mg | ORAL_TABLET | Freq: Once | ORAL | Status: AC
  Administered 2020-03-01: 500 mg via ORAL
  Filled 2020-03-01: qty 1

## 2020-03-01 MED ORDER — RISPERIDONE 1 MG PO TABS
2.0000 mg | ORAL_TABLET | Freq: Every day | ORAL | Status: DC
  Administered 2020-03-01 – 2020-03-09 (×9): 2 mg via ORAL
  Filled 2020-03-01 (×10): qty 2

## 2020-03-01 MED ORDER — LORATADINE 10 MG PO TABS
10.0000 mg | ORAL_TABLET | Freq: Every day | ORAL | Status: DC
  Administered 2020-03-01 – 2020-03-10 (×10): 10 mg via ORAL
  Filled 2020-03-01 (×10): qty 1

## 2020-03-01 MED ORDER — VITAMIN B-12 1000 MCG PO TABS
1000.0000 ug | ORAL_TABLET | Freq: Every day | ORAL | Status: DC
  Administered 2020-03-01 – 2020-03-10 (×9): 1000 ug via ORAL
  Filled 2020-03-01 (×10): qty 1

## 2020-03-01 MED ORDER — LORAZEPAM 2 MG/ML IJ SOLN: 1.0000 mg | INTRAMUSCULAR | Status: DC | PRN

## 2020-03-01 MED ORDER — ASPIRIN EC 81 MG PO TBEC
81.0000 mg | DELAYED_RELEASE_TABLET | Freq: Every day | ORAL | Status: DC
  Administered 2020-03-01 – 2020-03-10 (×10): 81 mg via ORAL
  Filled 2020-03-01 (×10): qty 1

## 2020-03-01 MED ORDER — LEVETIRACETAM 500 MG PO TABS
1000.0000 mg | ORAL_TABLET | Freq: Two times a day (BID) | ORAL | Status: DC
  Administered 2020-03-01 – 2020-03-02 (×3): 1000 mg via ORAL
  Filled 2020-03-01 (×3): qty 2

## 2020-03-01 MED ORDER — LOSARTAN POTASSIUM 50 MG PO TABS: 25.0000 mg | ORAL_TABLET | Freq: Every day | ORAL | Status: DC

## 2020-03-01 MED ORDER — PANTOPRAZOLE SODIUM 40 MG PO TBEC
40.0000 mg | DELAYED_RELEASE_TABLET | Freq: Every day | ORAL | Status: DC
  Administered 2020-03-01 – 2020-03-10 (×10): 40 mg via ORAL
  Filled 2020-03-01 (×10): qty 1

## 2020-03-01 MED ORDER — LEVETIRACETAM 500 MG PO TABS
1500.0000 mg | ORAL_TABLET | Freq: Every evening | ORAL | Status: DC
  Administered 2020-03-02 – 2020-03-09 (×8): 1500 mg via ORAL
  Filled 2020-03-01 (×8): qty 3

## 2020-03-01 MED ORDER — ACETAMINOPHEN 325 MG PO TABS: 650.0000 mg | ORAL_TABLET | ORAL | Status: DC | PRN

## 2020-03-01 MED ORDER — VITAMIN D (ERGOCALCIFEROL) 1.25 MG (50000 UNIT) PO CAPS
50000.0000 [IU] | ORAL_CAPSULE | ORAL | Status: DC
  Administered 2020-03-06: 50000 [IU] via ORAL
  Filled 2020-03-01: qty 1

## 2020-03-01 MED ORDER — ONDANSETRON HCL 4 MG/2ML IJ SOLN: 4.0000 mg | Freq: Four times a day (QID) | INTRAMUSCULAR | Status: DC | PRN

## 2020-03-01 MED ORDER — AMIODARONE LOAD VIA INFUSION
150.0000 mg | Freq: Once | INTRAVENOUS | Status: DC
  Filled 2020-03-01: qty 83.34

## 2020-03-01 MED ORDER — FENOFIBRATE 160 MG PO TABS
160.0000 mg | ORAL_TABLET | Freq: Every day | ORAL | Status: DC
  Administered 2020-03-01 – 2020-03-10 (×10): 160 mg via ORAL
  Filled 2020-03-01 (×10): qty 1

## 2020-03-01 MED ORDER — MECLIZINE HCL 25 MG PO TABS
25.0000 mg | ORAL_TABLET | Freq: Three times a day (TID) | ORAL | Status: DC | PRN
  Filled 2020-03-01: qty 1

## 2020-03-01 MED ORDER — ADULT MULTIVITAMIN W/MINERALS CH
1.0000 | ORAL_TABLET | Freq: Every day | ORAL | Status: DC
  Administered 2020-03-01 – 2020-03-10 (×10): 1 via ORAL
  Filled 2020-03-01 (×10): qty 1

## 2020-03-01 MED ORDER — LEVETIRACETAM 500 MG PO TABS
1000.0000 mg | ORAL_TABLET | Freq: Every day | ORAL | Status: DC
  Administered 2020-03-03 – 2020-03-05 (×2): 1000 mg via ORAL
  Administered 2020-03-06: 500 mg via ORAL
  Administered 2020-03-07 – 2020-03-10 (×4): 1000 mg via ORAL
  Filled 2020-03-01 (×9): qty 2

## 2020-03-01 MED ORDER — VITAMIN D3 25 MCG (1000 UNIT) PO TABS
2000.0000 [IU] | ORAL_TABLET | Freq: Every day | ORAL | Status: DC
  Administered 2020-03-01 – 2020-03-10 (×10): 2000 [IU] via ORAL
  Filled 2020-03-01 (×20): qty 2

## 2020-03-01 NOTE — ED Notes (Signed)
Neurologist at bedside. 

## 2020-03-01 NOTE — ED Notes (Signed)
Pt to xray

## 2020-03-01 NOTE — Progress Notes (Signed)
Patient admitted to unit. Attempted orientation to room, call bell, and staff. Bed in lowest position. Fall safety plan reviewed. Full assessment to Epic. Skin assessment verified with Leslie Dales RN. Telemetry box verification with tele clerk- Box#: --[id number]. Will continue to monitor.

## 2020-03-01 NOTE — Consult Note (Signed)
Date of service: March 01, 2020 Patient Name: Raymond [name] MRN: [id number] DOB: 1961-07-18 Reason for consult: " Seizure"    Raymond [name] is a 59 y.o. male  has a past medical history of Epilepsy, Hyperlipidemia, Mental retardation, OCD (obsessive compulsive disorder), Severe mitral regurgitation who was brought in by EMS after he had a brief seizure was noted to be unresponsive.  Patient is unable to provide any history given his MRDD.  Caretaker at bedside provided most of the history.  Per caretaker, patient has been stable for the last several years on carbamazepine and Depakote I would have occasional breakthrough seizures about a couple times to a few times a year.  He was noted to have some tachycardia and some parkinsonian features.  He follows with Dr. Sherryll Burger outpatient for epilepsy.  Approximately 6 weeks ago, his carbamazepine was gradually weaned down over 5 weeks and he was started on Keppra 500 twice daily.  About 1 week into the taper of carbamazepine, he had a grand mal seizure which is very unusual for him.  His Keppra was increased to 1000 twice daily following the grand mal seizure.  Since then, he has had occasional brief seizure about every other week but no grand mal seizure.  Yesterday, he had a brief seizure which was followed by unresponsiveness for which EMS was called and he was brought into the hospital.  Per discussion with caretaker, he is pretty much back to his baseline right now.   ROS ROS: Denies any pain. Unable to obtain, he has baseline MRDD.  Past History Past Medical History:  Diagnosis Date   Epilepsy (HCC)    Epileptic seizures (HCC)    Hyperlipidemia    Mental retardation    OCD (obsessive compulsive disorder)    water drinking   OCD (obsessive compulsive disorder)    Severe mitral regurgitation    a. 02/2016 Echo: EF 55-60%, no rwma, MVP of posterior leaflet w/ an anteriorly directed MR - severe. Sev dil LA; b. TTE 8/18: EF of 55-60%,  normal wall motion, normal LV diastolic function parameters, severe mitral prolapse involving the anterior leaflet and posterior leaflet with severe regurgitation directed anteriorly, severely dilated left atrium, unable to estimate the PASP   Systolic murmur    Past Surgical History:  Procedure Laterality Date   COLONOSCOPY WITH PROPOFOL N/A 03/23/2019   Procedure: COLONOSCOPY WITH PROPOFOL;  Surgeon: Toledo, Boykin Nearing, MD;  Location: [facility] ENDOSCOPY;  Service: Gastroenterology;  Laterality: N/A;   ESOPHAGOGASTRODUODENOSCOPY (EGD) WITH PROPOFOL N/A 03/23/2019   Procedure: ESOPHAGOGASTRODUODENOSCOPY (EGD) WITH PROPOFOL;  Surgeon: Toledo, Boykin Nearing, MD;  Location: [facility] ENDOSCOPY;  Service: Gastroenterology;  Laterality: N/A;  NEEDS A RAPID   Family History  Family history unknown: Yes   Social History   Tobacco Use   Smoking status: Never Smoker   Smokeless tobacco: Never Used  Vaping Use   Vaping Use: Never used  Substance Use Topics   Alcohol use: No    Alcohol/week: 0.0 standard drinks   Drug use: No   No Known Allergies   (Not in a hospital admission)    Temp:  [98.1 F (36.7 C)] 98.1 F (36.7 C) (08/02 2209) Pulse Rate:  [45-146] 113 (08/03 1632) Resp:  [18-26] 20 (08/03 1600) BP: (82-111)/(64-91) 86/64 (08/03 1630) SpO2:  [93 %-100 %] 96 % (08/03 1630) Weight:  [63.7 kg] 63.7 kg (08/02 2206) Body mass index is 21.35 kg/m.  General: Laying comfortably in bed; in no acute distress. HENT: Normal  oropharynx and mucosa. Normal external appearance of ears and nose Neck: Supple, no pain or tenderness. CV: No JVD. No peripheral edema. Pulmonary: Symmetric chest rise. Normal respiratoy effort. Abdomen: soft, non-tender Ext: No cyanosis, edema, or deformity. Skin: No rash. Normal palpation of skin. Musculoskeletal: Normal digits and nails by inspection. No clubbing.  Neurologic Examination  MENTAL STATUS: A and oriented to self only. LANG/SPEECH: dysarthric  speech, responds only with a yes or no and that is his baseline. CRANIAL NERVES: II: Pupils equal and reactive III, IV, VI: EOM intact, dysconjugate gaze, no gaze preference or deviation. V: normal sensation in V1, V2, and V3 segments bilaterally VII: no asymmetry, no nasolabial fold flattening VIII: normal hearing to speech IX, X: - unable to assess. XI: -  XII: midline tongue protrusion MOTOR: Flexor tone is mildly increased. Moves all extremitie son commands. Squeezes my fingers with BL hands with atleast a 4/5 strength BL. Lifts both legs off the bed.    REFLEXES: diffusely hyperreflexic, bilateral planter response was mute, no Hoffman's, no clonus SENSORY: Regards touch in all extremities. No hemineglect, no extinction to double sided stimulation (visual & tactile) Coordination: Ataxia noted with FTN and unable to get him to do heel to shin.  Imaging/Labs/Diagnostics: MRI Brain from 10/08/19: 1. No acute intracranial abnormality. 2. Diffuse parenchymal volume loss, more pronounced than expected for age. Stable since prior MRI.  Impression and plan: Raymond [name] is a 59 y.o. male with PMH significant for Epilepsy, Hyperlipidemia, Mental retardation, OCD (obsessive compulsive disorder), Severe mitral regurgitation who was brought in by EMS after he had a brief seizure was noted to be unresponsive.  Now back to his baseline.  In addition to that, it seems like he has been developing some parkinsonian features which have been attributed to  Overall, it seems like his seizure frequency has decreased once his Keppra dose was increased to 1000 mg twice daily from 500 mg twice daily.  He has not had any grand mal seizures since and has only had a couple breakthrough brief seizures.  Recs: -I increased his Keppra to 1000 mg in AM and 1500mg  in PM.  My thought process behind doing so is a notable decrease in seizure frequency and no more grand mal seizures after Keppra was increased  from 500 mg twice a day to 1000 mg twice a day. -Continue Valproic Acid 500mg  TID -I ordered free and total Valproic acid levels.  It is important to note that carbamazepine is a enzyme inducer and stimulate the metabolism and reduce the concentration of valproic acid. His valproic acid dosing may need some down titration now that he is off Carbamezepine.  ______________________________________________________________________  Thank you for the opportunity to take part in the care of this patient. If you have any further questions, please contact the neurology consultation attending on call. Signed,   [facility] Pager Number 

## 2020-03-01 NOTE — ED Notes (Addendum)
MD informed by this nurse again of pt continued elevated HR, pt also sounds congested

## 2020-03-01 NOTE — Progress Notes (Signed)
Cross Cover Brief note   Patient return of RVR with a fib.  Attempted to improve BP with albumin to enable use of additional BB or cardizem but no improvement noted.  Amio ordered.. Initial improvement after bolus.  BP slightly softer than when started but patient remains asymptomatic and MAP remains above 65

## 2020-03-01 NOTE — ED Notes (Signed)
Dr Clyde Lundborg notified of pt HR 120s, BP 90s systolic, to continue current dose of cardizem 7.5mg /hr

## 2020-03-01 NOTE — ED Notes (Signed)
MD notified of pt HR.

## 2020-03-01 NOTE — ED Notes (Signed)
Pt remains in bed and asleep at this time, pt laying on back at this time, call bell remains in reach and lights dimmed

## 2020-03-01 NOTE — ED Notes (Signed)
Pt is cleaned of urine by this nurse and Earl Gala. PT received full linen change and brief. Pt given warm blankets and lights dimmed in room. Pt male purewick is repositioned at this time. Call light in reach

## 2020-03-01 NOTE — H&P (Signed)
History and Physical    Raymond RobCharles T Chaviano BJY:[id number] DOB: 1960/11/04 DOA: 02/29/2020  Referring MD/NP/PA:   PCP: [facility]   Patient coming from:  The patient is coming from group home.  At baseline, pt is dependent for most of ADL.        Chief Complaint: seizure  HPI: Raymond [name] is a 59 y.o. male with medical history significant of hypertension, mental retardation with aphasia, hypertension, hyperlipidemia, GERD, severe mitral valve regurgitation, dCHF, OCD, who presents with seizure.  Per caregiver, patient used to take carbamazepine and Depakote for seizure.  His carbamazepine has been tapering down and discontinued recently. He is started on Keppra 1000 mg twice a day.  Today patient was noted to have seizure-like activity and was in postictal status.  Patient was given 500 cc normal saline by EMS.  Initially patient was breathing normally.  After received another 1.5 L normal saline in ED, patient developed shortness breath and tachycardia.  She was found to have new onset atrial fibrillation with RVR in the ED.  Patient does not seem to have chest pain, no active cough.  No active nausea, vomiting, diarrhea noted.  Per caregiver, patient has a chronic fecal incontinence.  No fever or chills.  ED Course: pt was found to have WBC 6.4, subtherapeutic carbamazepine level (<2.0), initial valproate level is supratherapeutic at 134, but the repeated valproic level is therapeutic 82.  Pending Covid PCR.  Temperature normal.  Soft blood pressure, RR 26, oxygen saturation 97% on room air.  Chest x-ray showed cardiomegaly and interstitial pulmonary edema.  Patient is placed on progressive bed for observation    Review of Systems: Could not be reviewed accurately since patient has aphasia.  Allergy: No Known Allergies  Past Medical History:  Diagnosis Date  . Epilepsy (HCC)   . Epileptic seizures (HCC)   . Hyperlipidemia   . Mental retardation   . OCD (obsessive  compulsive disorder)    water drinking  . OCD (obsessive compulsive disorder)   . Severe mitral regurgitation    a. 02/2016 Echo: EF 55-60%, no rwma, MVP of posterior leaflet w/ an anteriorly directed MR - severe. Sev dil LA; b. TTE 8/18: EF of 55-60%, normal wall motion, normal LV diastolic function parameters, severe mitral prolapse involving the anterior leaflet and posterior leaflet with severe regurgitation directed anteriorly, severely dilated left atrium, unable to estimate the PASP  . Systolic murmur     Past Surgical History:  Procedure Laterality Date  . COLONOSCOPY WITH PROPOFOL N/A 03/23/2019   Procedure: COLONOSCOPY WITH PROPOFOL;  Surgeon: Toledo, Boykin Nearingeodoro K, MD;  Location: [facility] ENDOSCOPY;  Service: Gastroenterology;  Laterality: N/A;  . ESOPHAGOGASTRODUODENOSCOPY (EGD) WITH PROPOFOL N/A 03/23/2019   Procedure: ESOPHAGOGASTRODUODENOSCOPY (EGD) WITH PROPOFOL;  Surgeon: Toledo, Boykin Nearingeodoro K, MD;  Location: [facility] ENDOSCOPY;  Service: Gastroenterology;  Laterality: N/A;  NEEDS A RAPID    Social History:  reports that he has never smoked. He has never used smokeless tobacco. He reports that he does not drink alcohol and does not use drugs.  Family History:  Family History  Family history unknown: Yes     Prior to Admission medications   Medication Sig Start Date End Date Taking? Authorizing Provider  albuterol (PROVENTIL HFA;VENTOLIN HFA) 108 (90 Base) MCG/ACT inhaler Inhale 2 puffs into the lungs every 6 (six) hours as needed for wheezing or shortness of breath. 07/26/16  Yes Particia NearingLane, Rachel Elizabeth, PA-C  aspirin EC 81 MG tablet Take 81 mg by mouth daily.  Yes [provider]  carbamazepine (TEGRETOL) 200 MG tablet Take 1 tablet (200 mg total) by mouth 3 (three) times daily. 02/27/17  Yes Crissman, Redge Gainer, MD  carvedilol (COREG) 3.125 MG tablet Take 3.125 mg by mouth 2 (two) times daily with a meal.   Yes [provider]  Cholecalciferol (VITAMIN D) 50 MCG (2000 UT)  CAPS Take 2,000 Units by mouth daily.    Yes [provider]  Cyanocobalamin (VITAMIN B 12 PO) Take 1,000 mcg by mouth daily.    Yes [provider]  divalproex (DEPAKOTE) 500 MG DR tablet TAKE 1 TABLET BY MOUTH THREE TIMES DAILY WITH MEALS. 07/02/16  Yes Crissman, Redge Gainer, MD  Ergocalciferol (VITAMIN D2 PO) Take 1.25 mg by mouth once a week.   Yes [provider]  famotidine (PEPCID) 40 MG tablet Take 40 mg by mouth at bedtime.   Yes [provider]  fenofibrate (TRICOR) 145 MG tablet TAKE 1 TABLET BY MOUTH ONCE DAILY FOR LIPIDS. 01/02/17  Yes Crissman, Redge Gainer, MD  fluticasone (FLONASE) 50 MCG/ACT nasal spray Place 2 sprays into both nostrils daily. Use PRN 02/23/16  Yes Crissman, Redge Gainer, MD  guaiFENesin (MUCINEX) 600 MG 12 hr tablet Take 1 tablet (600 mg total) by mouth 2 (two) times daily as needed. 04/24/18  Yes Johnson, Megan P, DO  loratadine (CLARITIN) 10 MG tablet TAKE 1 TABLET BY MOUTH ONCE DAILY. 04/02/17  Yes Johnson, Megan P, DO  losartan (COZAAR) 25 MG tablet Take 25 mg by mouth daily.   Yes [provider]  meclizine (ANTIVERT) 25 MG tablet Take 25 mg by mouth 3 (three) times daily as needed for dizziness.   Yes [provider]  Multiple Vitamin (MULTIVITAMIN WITH MINERALS) TABS tablet Take 1 tablet by mouth daily.   Yes [provider]  Nutritional Supplements (ENSURE PO) Take 1 Can by mouth daily.   Yes [provider]  omeprazole (PRILOSEC) 40 MG capsule Take 40 mg by mouth daily.   Yes [provider]  risperiDONE (RISPERDAL) 1 MG tablet Take 1 mg by mouth daily.  11/27/17  Yes [provider]  risperiDONE (RISPERDAL) 2 MG tablet Take 2 mg by mouth at bedtime.  10/28/17  Yes [provider]  Wheat Dextrin (BENEFIBER PO) Take by mouth daily. 2 ? in 8 oz QD   Yes [provider]    Physical Exam: Vitals:   03/01/20 1630 03/01/20 1632 03/01/20 1752 03/01/20 1828  BP: (!) 86/64  100/70  103/82  Pulse: 95 (!) 113 90 92  Resp:   (!) 22 16  Temp:    (!) 97.4 F (36.3 C)  TempSrc:      SpO2: 96%  95% 98%  Weight:    59.5 kg  Height:    5\' 8"  (1.727 m)   General: Not in acute distress HEENT:       Eyes: PERRL, EOMI, no scleral icterus.       ENT: No discharge from the ears and nose, no pharynx injection, no tonsillar enlargement.        Neck: No JVD, no bruit, no mass felt. Heme: No neck lymph node enlargement. Cardiac: S1/S2, irregularly irregular rhythm, has 2/2 systolic murmurs, No gallops or rubs. Respiratory:  No rales, wheezing, rhonchi or rubs. GI: Soft, nondistended, nontender, no organomegaly, BS present. GU: No hematuria Ext: No pitting leg edema bilaterally. 2+DP/PT pulse bilaterally. Musculoskeletal: No joint deformities, No joint redness or warmth, no limitation of ROM  in spin. Skin: No rashes.  Neuro: has aphasia. Cranial nerves II-XII grossly intact, moves all extremities Psych: Patient is not psychotic, no suicidal or hemocidal ideation.  Labs on Admission: I have personally reviewed following labs and imaging studies  CBC: Recent Labs  Lab 02/29/20 2218  WBC 6.4  HGB 10.9*  HCT 32.1*  MCV 93.9  PLT 160   Basic Metabolic Panel: Recent Labs  Lab 02/29/20 2218  NA 136  K 4.7  CL 107  CO2 20*  GLUCOSE 103*  BUN 17  CREATININE 1.05  CALCIUM 8.4*   GFR: Estimated Creatinine Clearance: 65.3 mL/min (by C-G formula based on SCr of 1.05 mg/dL). Liver Function Tests: Recent Labs  Lab 02/29/20 2218  AST 21  ALT 13  ALKPHOS 21*  BILITOT 0.7  PROT 6.2*  ALBUMIN 3.7   No results for input(s): LIPASE, AMYLASE in the last 168 hours. No results for input(s): AMMONIA in the last 168 hours. Coagulation Profile: No results for input(s): INR, PROTIME in the last 168 hours. Cardiac Enzymes: No results for input(s): CKTOTAL, CKMB, CKMBINDEX, TROPONINI in the last 168 hours. BNP (last 3 results) No results for input(s): PROBNP in the last  8760 hours. HbA1C: No results for input(s): HGBA1C in the last 72 hours. CBG: No results for input(s): GLUCAP in the last 168 hours. Lipid Profile: No results for input(s): CHOL, HDL, LDLCALC, TRIG, CHOLHDL, LDLDIRECT in the last 72 hours. Thyroid Function Tests: No results for input(s): TSH, T4TOTAL, FREET4, T3FREE, THYROIDAB in the last 72 hours. Anemia Panel: No results for input(s): VITAMINB12, FOLATE, FERRITIN, TIBC, IRON, RETICCTPCT in the last 72 hours. Urine analysis:    Component Value Date/Time   COLORURINE YELLOW (A) 03/01/2020 0407   APPEARANCEUR CLEAR (A) 03/01/2020 0407   APPEARANCEUR Clear 12/19/2017 1424   LABSPEC 1.011 03/01/2020 0407   PHURINE 5.0 03/01/2020 0407   GLUCOSEU NEGATIVE 03/01/2020 0407   HGBUR NEGATIVE 03/01/2020 0407   BILIRUBINUR NEGATIVE 03/01/2020 0407   BILIRUBINUR Negative 12/19/2017 1424   KETONESUR NEGATIVE 03/01/2020 0407   PROTEINUR NEGATIVE 03/01/2020 0407   NITRITE NEGATIVE 03/01/2020 0407   LEUKOCYTESUR NEGATIVE 03/01/2020 0407   Sepsis Labs: @LABRCNTIP (procalcitonin:4,lacticidven:4) ) Recent Results (from the past 240 hour(s))  SARS Coronavirus 2 by RT PCR (hospital order, performed in [facility] hospital lab) Nasopharyngeal Nasopharyngeal Swab     Status: None   Collection Time: 03/01/20  6:22 AM   Specimen: Nasopharyngeal Swab  Result Value Ref Range Status   SARS Coronavirus 2 NEGATIVE NEGATIVE Final    Comment: (NOTE) SARS-CoV-2 target nucleic acids are NOT DETECTED.  The SARS-CoV-2 RNA is generally detectable in upper and lower respiratory specimens during the acute phase of infection. The lowest concentration of SARS-CoV-2 viral copies this assay can detect is 250 copies / mL. A negative result does not preclude SARS-CoV-2 infection and should not be used as the sole basis for treatment or other patient management decisions.  A negative result may occur with improper specimen collection / handling, submission of  specimen other than nasopharyngeal swab, presence of viral mutation(s) within the areas targeted by this assay, and inadequate number of viral copies (<250 copies / mL). A negative result must be combined with clinical observations, patient history, and epidemiological information.  Fact Sheet for Patients:   BoilerBrush.com.cy  Fact Sheet for Healthcare Providers: https://pope.com/  This test is not yet approved or  cleared by the [place] FDA and has been authorized for detection and/or diagnosis of SARS-CoV-2 by  FDA under an Emergency Use Authorization (EUA).  This EUA will remain in effect (meaning this test can be used) for the duration of the COVID-19 declaration under Section 564(b)(1) of the Act, 21 U.S.C. section 360bbb-3(b)(1), unless the authorization is terminated or revoked sooner.  Performed at [facility], [address] [place], [place] [address]      Radiological Exams on Admission: DG Chest 2 View  Result Date: 03/01/2020 CLINICAL DATA:  Cough.  Tachycardia. EXAM: CHEST - 2 VIEW COMPARISON:  CT abdomen 10/07/2019.  Chest x-ray 07/22/2016. FINDINGS: Cardiomegaly with pulmonary venous congestion and bilateral interstitial prominence most consistent interstitial edema. Pneumonitis cannot be excluded. No pleural effusion or pneumothorax. Pulmonary nodule identified on prior CT of 10/07/2019 best identified by prior CT. No acute bony abnormality. IMPRESSION: Cardiomegaly with pulmonary venous congestion and bilateral interstitial prominence most consistent with congestive heart failure with interstitial edema. No pleural effusion. Electronically Signed   By: Maisie Fus  Register   On: 03/01/2020 06:04   ECHOCARDIOGRAM COMPLETE  Result Date: 03/01/2020    ECHOCARDIOGRAM REPORT   Patient Name:   EVERTTE SONES Date of Exam: 03/01/2020 Medical Rec #:  [id number]        Height:       68.0 in Accession #:    [id number]        Weight:       140.4 lb Date of Birth:  [birth year]       BSA:          1.759 m Patient Age:    58 years         BP:           103/79 mmHg Patient Gender: M                HR:           114 bpm. Exam Location:  [facility] Procedure: 2D Echo, Color Doppler and Cardiac Doppler Indications:     CHF- acute diastolic 428.31  History:         Patient has prior history of Echocardiogram examinations, most                  recent 10/29/2017. Signs/Symptoms:Murmur. Severe Mitral                  regurgitation.  Sonographer:     Cristela Blue RDCS (AE) Referring Phys:  [id number] Brien Few Neeya Prigmore Diagnosing Phys: Harold Hedge MD IMPRESSIONS  1. Left ventricular ejection fraction, by estimation, is 40 to 45%. Left ventricular ejection fraction by 2D MOD biplane is 34.5 %. The left ventricle has mildly decreased function. The left ventricle demonstrates global hypokinesis. Left ventricular diastolic parameters were normal.  2. Right ventricular systolic function is normal. The right ventricular size is normal. There is normal pulmonary artery systolic pressure.  3. Left atrial size was moderately dilated.  4. Right atrial size was mildly dilated.  5. The mitral valve is abnormal. Moderate to severe mitral valve regurgitation.  6. The aortic valve was not well visualized. Aortic valve regurgitation is trivial. FINDINGS  Left Ventricle: Left ventricular ejection fraction, by estimation, is 40 to 45%. Left ventricular ejection fraction by 2D MOD biplane is 34.5 %. The left ventricle has mildly decreased function. The left ventricle demonstrates global hypokinesis. The left ventricular internal cavity size was normal in size. There is borderline left ventricular hypertrophy. Left ventricular diastolic parameters were normal. Right Ventricle: The right ventricular size is normal. No increase in right  ventricular wall thickness. Right ventricular systolic function is normal. There is normal pulmonary artery systolic pressure. The tricuspid regurgitant  velocity is 2.43 m/s, and  with an assumed right atrial pressure of 10 mmHg, the estimated right ventricular systolic pressure is 33.6 mmHg. Left Atrium: Left atrial size was moderately dilated. Right Atrium: Right atrial size was mildly dilated. Pericardium: There is no evidence of pericardial effusion. Mitral Valve: The mitral valve is abnormal. There is moderate prolapse of the medial scallop of the posterior leaflet of the mitral valve. Moderate to severe mitral valve regurgitation. MV peak gradient, 8.8 mmHg. The mean mitral valve gradient is 4.0 mmHg. Tricuspid Valve: The tricuspid valve is grossly normal. Tricuspid valve regurgitation is trivial. Aortic Valve: The aortic valve was not well visualized. Aortic valve regurgitation is trivial. Aortic valve mean gradient measures 15.7 mmHg. Aortic valve peak gradient measures 24.5 mmHg. Aortic valve area, by VTI measures 0.73 cm. Pulmonic Valve: The pulmonic valve was not well visualized. Pulmonic valve regurgitation is trivial. Aorta: The aortic root is normal in size and structure. IAS/Shunts: The interatrial septum was not assessed.  LEFT VENTRICLE PLAX 2D                        Biplane EF (MOD) LVIDd:         6.07 cm         LV Biplane EF:   Left LVIDs:         5.56 cm                          ventricular LV PW:         1.66 cm                          ejection LV IVS:        0.96 cm                          fraction by LVOT diam:     2.30 cm                          2D MOD LV SV:         35                               biplane is LV SV Index:   20                               34.5 %. LVOT Area:     4.15 cm                                Diastology                                LV e' lateral:   12.20 cm/s LV Volumes (MOD)               LV E/e' lateral: 8.4 LV vol d, MOD    133.0 ml      LV e' medial:    11.10 cm/s A2C:  LV E/e' medial:  9.2 LV vol d, MOD    169.0 ml A4C: LV vol s, MOD    93.1 ml A2C: LV vol s, MOD    99.5 ml A4C:  LV SV MOD A2C:   39.9 ml LV SV MOD A4C:   169.0 ml LV SV MOD BP:    53.6 ml RIGHT VENTRICLE RV S prime:     11.60 cm/s TAPSE (M-mode): 2.4 cm LEFT ATRIUM            Index       RIGHT ATRIUM           Index LA diam:      5.60 cm  3.18 cm/m  RA Area:     12.20 cm LA Vol (A2C): 98.9 ml  56.24 ml/m RA Volume:   26.50 ml  15.07 ml/m LA Vol (A4C): 173.0 ml 98.37 ml/m  AORTIC VALVE                    PULMONIC VALVE AV Area (Vmax):    0.73 cm     RVOT Peak grad: 3 mmHg AV Area (Vmean):   0.58 cm AV Area (VTI):     0.73 cm AV Vmax:           247.67 cm/s AV Vmean:          179.000 cm/s AV VTI:            0.485 m AV Peak Grad:      24.5 mmHg AV Mean Grad:      15.7 mmHg LVOT Vmax:         43.60 cm/s LVOT Vmean:        25.000 cm/s LVOT VTI:          0.085 m LVOT/AV VTI ratio: 0.17  AORTA Ao Root diam: 2.90 cm MITRAL VALVE                TRICUSPID VALVE MV Area (PHT): 4.36 cm     TR Peak grad:   23.6 mmHg MV Peak grad:  8.8 mmHg     TR Vmax:        243.00 cm/s MV Mean grad:  4.0 mmHg MV Vmax:       1.48 m/s     SHUNTS MV Vmean:      82.1 cm/s    Systemic VTI:  0.08 m MV Decel Time: 174 msec     Systemic Diam: 2.30 cm MV E velocity: 102.00 cm/s MV A velocity: 37.80 cm/s MV E/A ratio:  2.70 Harold Hedge MD Electronically signed by Harold Hedge MD Signature Date/Time: 03/01/2020/12:35:00 PM    Final      EKG: Independently reviewed.  Atrial fibrillation with RVR, nonspecific T wave change   Assessment/Plan Principal Problem:   Acute on chronic diastolic CHF (congestive heart failure) (HCC) Active Problems:   Hyperlipidemia   HTN (hypertension)   GERD (gastroesophageal reflux disease)   New onset atrial fibrillation (HCC)   Atrial fibrillation with RVR (HCC)   Seizure disorder (HCC)   Acute on chronic diastolic CHF (congestive heart failure) (HCC): 2D echo on 10/29/2017 showed EF of 55-60%.  After he received 2 L normal saline bolus, patient developed CHF exacerbation.  With elevated BNP 550, chest x-ray  showed interstitial pulmonary edema.  Since patient also has new onset atrial fibrillation with RVR, cardiology, Dr. Lady Gary is consulted.  -Place to progressive unit for observation -Lasix  x 1 by IV -2d  echo -Daily weights -strict I/O's -Low salt diet -Fluid restriction -Obtain REDs Vest reading  New onset atrial fibrillation with RVR (HCC): CHA2DS2-VASc Score is 2, will needs oral anticoagulation -->will follow up with cardiologist recommendation. -check TSH -started cardizem gtt -f/u cardiologist recommendation -Patient is on Coreg.  Hyperlipidemia -Fenofibrate  HTN (hypertension) -Coreg  GERD (gastroesophageal reflux disease) -Pepcid and Protonix  Seizure disorder [facility]): His home medication was adjusted recently.  Per caregiver, patient stopped taking carbamazepine. Neurology, Dr. Patrecia Pace Is consulted. -Will continue Depakote -Continue Keppra 1000 mg twice daily -Follow-up neurology's recommendation -Seizure precaution -When necessary Ativan for seizure    DVT ppx:  SQ Lovenox Code Status: Full code per care giver Family Communication:  Yes, patient's care giver at bed side Disposition Plan:  Anticipate discharge back to previous environment Consults called:  Dr. Lady Gary of card and Dr. Derry Lory of neuro Admission status:  progressive unit for obs   Status is: Observation  The patient remains OBS appropriate and will d/c before 2 midnights.  Dispo: The patient is from: Group home              Anticipated d/c is to: Group home              Anticipated d/c date is: 1 day              Patient currently is not medically stable to d/c.          Date of Service 03/01/2020    Lorretta Harp [facility]   If 7PM-7AM, please contact night-coverage [email or web address] 03/01/2020, 6:51 PM

## 2020-03-01 NOTE — ED Notes (Signed)
Dr Burnett Kanaris regarding HR 70s and BP 80s Systolic , cardizem stopped until Dr Clyde Lundborg can come [place]

## 2020-03-01 NOTE — Progress Notes (Signed)
*  PRELIMINARY RESULTS* Echocardiogram 2D Echocardiogram has been performed.  Cristela Blue 03/01/2020, 10:59 AM

## 2020-03-01 NOTE — Consult Note (Signed)
Cardiology Consultation Note    Patient ID: Raymond [name], MRN: [id number], DOB/AGE: [birth year] 59 y.o. Admit date: 02/29/2020   Date of Consult: 03/01/2020 Primary Physician: [name] Primary Cardiologist: Dr. Juliann Pares  Chief Complaint: seizure Reason for Consultation: [name] Requesting MD: Dr. Clyde Lundborg  HPI: Raymond [name] is a 59 y.o. male with history of seizure disorder, hyperlipidemia, OCD with a history of severe MR admitted with seizure.  Given IV fluids and developed crackles consistent with congestive heart failure.  Was followed by [facility] with echocardiogram done in 2019 read as showing EF 55 to 60% with mitral valve prolapse and severe MR.  Left atrium is severely dilated.  More recent echocardiogram per Dr. Juliann Pares in February 2020 showing an EF of 40% with severe left atrial enlargement severe MR.  Was found to have global hypokinesis.  Last seen in March of this year and apparently was doing okay.  He was treated by neurology with Keppra 1000 twice daily as well as Depakote 500 mg 3 times daily with plans to possibly discontinue Depakote and start on Vimpat.  Presented to the ER yesterday with a seizure.  Patient is nonverbal.  Was given IV fluids 2 L over 9 hours developed crackles.  BNP was 550.1, 1.08, with a valproate level of 134.  Chest x-ray showed cardiomegaly with pulmonary venous congestion.  EKG showed atrial fibrillation with rapid ventricular response.  Echocardiogram from this visit is pending.  Patient is currently on carvedilol 3.125 mg twice daily, enteric-coated aspirin, carbamazepine, divalproex, losartan 25 mg daily, Risperdal. Past Medical History:  Diagnosis Date  . Epilepsy (HCC)   . Epileptic seizures (HCC)   . Hyperlipidemia   . Mental retardation   . OCD (obsessive compulsive disorder)    water drinking  . OCD (obsessive compulsive disorder)   . Severe mitral regurgitation    a. 02/2016 Echo: EF 55-60%, no rwma, MVP of posterior leaflet w/ an  anteriorly directed MR - severe. Sev dil LA; b. TTE 8/18: EF of 55-60%, normal wall motion, normal LV diastolic function parameters, severe mitral prolapse involving the anterior leaflet and posterior leaflet with severe regurgitation directed anteriorly, severely dilated left atrium, unable to estimate the PASP  . Systolic murmur       Surgical History:  Past Surgical History:  Procedure Laterality Date  . COLONOSCOPY WITH PROPOFOL N/A 03/23/2019   Procedure: COLONOSCOPY WITH PROPOFOL;  Surgeon: Toledo, Boykin Nearing, MD;  Location: [facility] ENDOSCOPY;  Service: Gastroenterology;  Laterality: N/A;  . ESOPHAGOGASTRODUODENOSCOPY (EGD) WITH PROPOFOL N/A 03/23/2019   Procedure: ESOPHAGOGASTRODUODENOSCOPY (EGD) WITH PROPOFOL;  Surgeon: Toledo, Boykin Nearing, MD;  Location: [facility] ENDOSCOPY;  Service: Gastroenterology;  Laterality: N/A;  NEEDS A RAPID     Home Meds: Prior to Admission medications   Medication Sig Start Date End Date Taking? Authorizing Provider  albuterol (PROVENTIL HFA;VENTOLIN HFA) 108 (90 Base) MCG/ACT inhaler Inhale 2 puffs into the lungs every 6 (six) hours as needed for wheezing or shortness of breath. 07/26/16  Yes Particia Nearing, PA-C  aspirin EC 81 MG tablet Take 81 mg by mouth daily.   Yes [provider]  carbamazepine (TEGRETOL) 200 MG tablet Take 1 tablet (200 mg total) by mouth 3 (three) times daily. 02/27/17  Yes Crissman, Redge Gainer, MD  carvedilol (COREG) 3.125 MG tablet Take 3.125 mg by mouth 2 (two) times daily with a meal.   Yes [provider]  Cholecalciferol (VITAMIN D) 50 MCG (2000 UT) CAPS Take 2,000 Units  by mouth daily.    Yes [provider]  Cyanocobalamin (VITAMIN B 12 PO) Take 1,000 mcg by mouth daily.    Yes [provider]  divalproex (DEPAKOTE) 500 MG DR tablet TAKE 1 TABLET BY MOUTH THREE TIMES DAILY WITH MEALS. 07/02/16  Yes Crissman, Redge Gainer, MD  Ergocalciferol (VITAMIN D2 PO) Take 1.25 mg by mouth once a week.   Yes  [provider]  famotidine (PEPCID) 40 MG tablet Take 40 mg by mouth at bedtime.   Yes [provider]  fenofibrate (TRICOR) 145 MG tablet TAKE 1 TABLET BY MOUTH ONCE DAILY FOR LIPIDS. 01/02/17  Yes Crissman, Redge Gainer, MD  fluticasone (FLONASE) 50 MCG/ACT nasal spray Place 2 sprays into both nostrils daily. Use PRN 02/23/16  Yes Crissman, Redge Gainer, MD  guaiFENesin (MUCINEX) 600 MG 12 hr tablet Take 1 tablet (600 mg total) by mouth 2 (two) times daily as needed. 04/24/18  Yes Johnson, Megan P, DO  loratadine (CLARITIN) 10 MG tablet TAKE 1 TABLET BY MOUTH ONCE DAILY. 04/02/17  Yes Johnson, Megan P, DO  losartan (COZAAR) 25 MG tablet Take 25 mg by mouth daily.   Yes [provider]  meclizine (ANTIVERT) 25 MG tablet Take 25 mg by mouth 3 (three) times daily as needed for dizziness.   Yes [provider]  Multiple Vitamin (MULTIVITAMIN WITH MINERALS) TABS tablet Take 1 tablet by mouth daily.   Yes [provider]  Nutritional Supplements (ENSURE PO) Take 1 Can by mouth daily.   Yes [provider]  omeprazole (PRILOSEC) 40 MG capsule Take 40 mg by mouth daily.   Yes [provider]  risperiDONE (RISPERDAL) 1 MG tablet Take 1 mg by mouth daily.  11/27/17  Yes [provider]  risperiDONE (RISPERDAL) 2 MG tablet Take 2 mg by mouth at bedtime.  10/28/17  Yes [provider]  Wheat Dextrin (BENEFIBER PO) Take by mouth daily. 2 ? in 8 oz QD   Yes [provider]    Inpatient Medications:  . aspirin EC  81 mg Oral Daily  . Benefiber  1 each Oral Daily  . carbamazepine  300 mg Oral TID  . carvedilol  3.125 mg Oral BID WC  . cholecalciferol  2,000 Units Oral Daily  . divalproex  500 mg Oral TID WC  . enoxaparin (LOVENOX) injection  40 mg Subcutaneous Q24H  . Ensure  237 mL Oral Daily  . famotidine  40 mg Oral QHS  . fenofibrate  160 mg Oral Daily  . loratadine  10 mg Oral Daily  . [START ON 03/02/2020] losartan  25 mg Oral  Daily  . multivitamin with minerals  1 tablet Oral Daily  . pantoprazole  40 mg Oral Daily  . risperiDONE  1 mg Oral Daily  . risperiDONE  2 mg Oral QHS  . sodium chloride flush  3 mL Intravenous Q12H  . vitamin B-12  1,000 mcg Oral Daily  . Vitamin D2  1.25 mg Oral Weekly   . sodium chloride      Allergies: No Known Allergies  Social History   Socioeconomic History  . Marital status: Single    Spouse name: Not on file  . Number of children: Not on file  . Years of education: Not on file  . Highest education level: Not on file  Occupational History  . Not on file  Tobacco Use  . Smoking status: Never Smoker  . Smokeless tobacco: Never Used  Vaping Use  .  Vaping Use: Never used  Substance and Sexual Activity  . Alcohol use: No    Alcohol/week: 0.0 standard drinks  . Drug use: No  . Sexual activity: Never  Other Topics Concern  . Not on file  Social History Narrative   ** Merged History Encounter **       Social Determinants of Health   Financial Resource Strain:   . Difficulty of Paying Living Expenses:   Food Insecurity:   . Worried About Programme researcher, broadcasting/film/video in the Last Year:   . Barista in the Last Year:   Transportation Needs:   . Freight forwarder (Medical):   Marland Kitchen Lack of Transportation (Non-Medical):   Physical Activity:   . Days of Exercise per Week:   . Minutes of Exercise per Session:   Stress:   . Feeling of Stress :   Social Connections:   . Frequency of Communication with Friends and Family:   . Frequency of Social Gatherings with Friends and Family:   . Attends Religious Services:   . Active Member of Clubs or Organizations:   . Attends Banker Meetings:   Marland Kitchen Marital Status:   Intimate Partner Violence:   . Fear of Current or Ex-Partner:   . Emotionally Abused:   Marland Kitchen Physically Abused:   . Sexually Abused:      Family History  Family history unknown: Yes     Review of Systems: A 12-system review of systems was  performed and is negative except as noted in the HPI.  Labs: No results for input(s): CKTOTAL, CKMB, TROPONINI in the last 72 hours. Lab Results  Component Value Date   WBC 6.4 02/29/2020   HGB 10.9 (L) 02/29/2020   HCT 32.1 (L) 02/29/2020   MCV 93.9 02/29/2020   PLT 160 02/29/2020    Recent Labs  Lab 02/29/20 2218  NA 136  K 4.7  CL 107  CO2 20*  BUN 17  CREATININE 1.05  CALCIUM 8.4*  PROT 6.2*  BILITOT 0.7  ALKPHOS 21*  ALT 13  AST 21  GLUCOSE 103*   Lab Results  Component Value Date   CHOL 191 12/19/2017   HDL 35 (L) 12/19/2017   LDLCALC 120 (H) 12/19/2017   TRIG 178 (H) 12/19/2017   No results found for: DDIMER  Radiology/Studies:  DG Chest 2 View  Result Date: 03/01/2020 CLINICAL DATA:  Cough.  Tachycardia. EXAM: CHEST - 2 VIEW COMPARISON:  CT abdomen 10/07/2019.  Chest x-ray 07/22/2016. FINDINGS: Cardiomegaly with pulmonary venous congestion and bilateral interstitial prominence most consistent interstitial edema. Pneumonitis cannot be excluded. No pleural effusion or pneumothorax. Pulmonary nodule identified on prior CT of 10/07/2019 best identified by prior CT. No acute bony abnormality. IMPRESSION: Cardiomegaly with pulmonary venous congestion and bilateral interstitial prominence most consistent with congestive heart failure with interstitial edema. No pleural effusion. Electronically Signed   By: Maisie Fus  Register   On: 03/01/2020 06:04    Wt Readings from Last 3 Encounters:  02/29/20 63.7 kg  03/23/19 68 kg  12/19/17 63.9 kg    EKG: Atrial fibrillation with rapid ventricular response.  Physical Exam: Chronically ill-appearing male Blood pressure 100/77, pulse 84, temperature 98.1 F (36.7 C), temperature source Axillary, resp. rate (!) 26, height 5\' 8"  (1.727 m), weight 63.7 kg, SpO2 97 %. Body mass index is 21.35 kg/m. General: Well developed, well nourished, in no acute distress. Head: Normocephalic, atraumatic, sclera non-icteric, no xanthomas,  nares are without discharge.  Neck:  Negative for carotid bruits. JVD not elevated. Lungs: Clear bilaterally to auscultation without wheezes, rales, or rhonchi. Breathing is unlabored. Heart: Irregular regular rhythm with 1/6 to 2/6 systolic murmur Abdomen: Soft, non-tender, non-distended with normoactive bowel sounds. No hepatomegaly. No rebound/guarding. No obvious abdominal masses. Msk:  Strength and tone appear normal for age. Extremities: No clubbing or cyanosis. No edema.  Distal pedal pulses are 2+ and equal bilaterally. Neuro: Nonverbal.     Assessment and Plan  59 year old male with history of seizure disorder, hyperlipidemia, OCD with history of severe MR with mild reduced LV function.  Was sent to the emergency room with a seizure.  Was given IV fluids followed by pulmonary edema.  Is of note that he has a prior echocardiogram done approximately 1 year ago showing ejection fraction of 40% with severe left atrial enlargement and severe MR.  Had global hypokinesis.  Echocardiogram during this visit revealed similar ejection fraction with moderate to severe MR and moderate mitral valve prolapse.  Patient went to pulmonary edema after IV fluids  1 mitral valve disease-appears chronic.  Not an ideal candidate for intervention invasively or noninvasively.  Would continue to carefully diurese following hemodynamics and electrolytes.  Has diuresed approximately 1 L since presentation.  2.  CHF-as per above.  Likely secondary to his mitral valve disease and overhydration.  Will carefully diurese.  Has diuresed approximately 1 L thus far.  We will continue with careful diuresis as per above.  3.  Atrial fibrillation-currently in A. fib with RVR.  Not surprising given his moderate to severely enlarged left atrium.  Received IV diltiazem bolus.  Currently also on carvedilol 3.125 mg twice daily.  Somewhat high bleeding risk from a cardiac standpoint.  Will attempt to control rate and discuss chronic  anticoagulation at discharge.   Signed, Dalia HeadingKenneth A Wilfred Siverson MD 03/01/2020, 7:49 AM Pager: [phone]

## 2020-03-01 NOTE — Progress Notes (Signed)
Saw patient this afternoon.  Afib rate in the 70 -90's. Systolic pressure in the 80's. Pateint asymptomatic. Sitting up in bed eating french fries and a cheese burger. Have stopped losartan and agree with stopping iv cardizem . Will continue with carvedilol for now but may stop this if pressure becomes symptomatic.

## 2020-03-02 DIAGNOSIS — E43 Unspecified severe protein-calorie malnutrition: Secondary | ICD-10-CM | POA: Diagnosis present

## 2020-03-02 DIAGNOSIS — Z7982 Long term (current) use of aspirin: Secondary | ICD-10-CM | POA: Diagnosis not present

## 2020-03-02 DIAGNOSIS — R4701 Aphasia: Secondary | ICD-10-CM | POA: Diagnosis present

## 2020-03-02 DIAGNOSIS — Z7189 Other specified counseling: Secondary | ICD-10-CM | POA: Diagnosis not present

## 2020-03-02 DIAGNOSIS — I959 Hypotension, unspecified: Secondary | ICD-10-CM

## 2020-03-02 DIAGNOSIS — Z515 Encounter for palliative care: Secondary | ICD-10-CM | POA: Diagnosis not present

## 2020-03-02 DIAGNOSIS — I4891 Unspecified atrial fibrillation: Secondary | ICD-10-CM | POA: Diagnosis present

## 2020-03-02 DIAGNOSIS — E781 Pure hyperglyceridemia: Secondary | ICD-10-CM

## 2020-03-02 DIAGNOSIS — G40409 Other generalized epilepsy and epileptic syndromes, not intractable, without status epilepticus: Secondary | ICD-10-CM | POA: Diagnosis present

## 2020-03-02 DIAGNOSIS — R531 Weakness: Secondary | ICD-10-CM | POA: Diagnosis not present

## 2020-03-02 DIAGNOSIS — N189 Chronic kidney disease, unspecified: Secondary | ICD-10-CM | POA: Diagnosis not present

## 2020-03-02 DIAGNOSIS — I340 Nonrheumatic mitral (valve) insufficiency: Secondary | ICD-10-CM | POA: Diagnosis not present

## 2020-03-02 DIAGNOSIS — I429 Cardiomyopathy, unspecified: Secondary | ICD-10-CM | POA: Diagnosis present

## 2020-03-02 DIAGNOSIS — Z79899 Other long term (current) drug therapy: Secondary | ICD-10-CM | POA: Diagnosis not present

## 2020-03-02 DIAGNOSIS — R569 Unspecified convulsions: Secondary | ICD-10-CM

## 2020-03-02 DIAGNOSIS — G40909 Epilepsy, unspecified, not intractable, without status epilepticus: Secondary | ICD-10-CM | POA: Diagnosis not present

## 2020-03-02 DIAGNOSIS — E785 Hyperlipidemia, unspecified: Secondary | ICD-10-CM | POA: Diagnosis present

## 2020-03-02 DIAGNOSIS — Z20822 Contact with and (suspected) exposure to covid-19: Secondary | ICD-10-CM | POA: Diagnosis present

## 2020-03-02 DIAGNOSIS — F429 Obsessive-compulsive disorder, unspecified: Secondary | ICD-10-CM | POA: Diagnosis present

## 2020-03-02 DIAGNOSIS — N179 Acute kidney failure, unspecified: Secondary | ICD-10-CM | POA: Diagnosis not present

## 2020-03-02 DIAGNOSIS — N182 Chronic kidney disease, stage 2 (mild): Secondary | ICD-10-CM | POA: Diagnosis present

## 2020-03-02 DIAGNOSIS — F72 Severe intellectual disabilities: Secondary | ICD-10-CM | POA: Diagnosis present

## 2020-03-02 DIAGNOSIS — R159 Full incontinence of feces: Secondary | ICD-10-CM | POA: Diagnosis present

## 2020-03-02 DIAGNOSIS — R000 Tachycardia, unspecified: Secondary | ICD-10-CM | POA: Diagnosis present

## 2020-03-02 DIAGNOSIS — Z681 Body mass index (BMI) 19 or less, adult: Secondary | ICD-10-CM | POA: Diagnosis not present

## 2020-03-02 DIAGNOSIS — K219 Gastro-esophageal reflux disease without esophagitis: Secondary | ICD-10-CM | POA: Diagnosis present

## 2020-03-02 DIAGNOSIS — I5033 Acute on chronic diastolic (congestive) heart failure: Secondary | ICD-10-CM | POA: Diagnosis present

## 2020-03-02 DIAGNOSIS — I130 Hypertensive heart and chronic kidney disease with heart failure and stage 1 through stage 4 chronic kidney disease, or unspecified chronic kidney disease: Secondary | ICD-10-CM | POA: Diagnosis present

## 2020-03-02 LAB — BASIC METABOLIC PANEL
Anion gap: 10 (ref 5–15)
BUN: 18 mg/dL (ref 6–20)
CO2: 22 mmol/L (ref 22–32)
Calcium: 8.6 mg/dL — ABNORMAL LOW (ref 8.9–10.3)
Chloride: 105 mmol/L (ref 98–111)
Creatinine, Ser: 1.13 mg/dL (ref 0.61–1.24)
GFR calc Af Amer: >60 mL/min (ref >60)
GFR calc non Af Amer: >60 mL/min (ref >60)
Glucose, Bld: 121 mg/dL — ABNORMAL HIGH (ref 70–99)
Potassium: 4.2 mmol/L (ref 3.5–5.1)
Sodium: 137 mmol/L (ref 135–145)

## 2020-03-02 LAB — CBC
HCT: 31.1 % — ABNORMAL LOW (ref 39.0–52.0)
Hemoglobin: 10.3 g/dL — ABNORMAL LOW (ref 13.0–17.0)
MCH: 32 pg (ref 26.0–34.0)
MCHC: 33.1 g/dL (ref 30.0–36.0)
MCV: 96.6 fL (ref 80.0–100.0)
Platelets: 141 10*3/uL — ABNORMAL LOW (ref 150–400)
RBC: 3.22 MIL/uL — ABNORMAL LOW (ref 4.22–5.81)
RDW: 16.2 % — ABNORMAL HIGH (ref 11.5–15.5)
WBC: 6.1 10*3/uL (ref 4.0–10.5)
nRBC: 0 % (ref 0.0–0.2)

## 2020-03-02 LAB — VALPROIC ACID LEVEL: Valproic Acid Lvl: 59 ug/mL (ref 50.0–100.0)

## 2020-03-02 LAB — CARBAMAZEPINE LEVEL, TOTAL: Carbamazepine Lvl: 3.8 ug/mL — ABNORMAL LOW (ref 4.0–12.0)

## 2020-03-02 LAB — TSH: TSH: 2.268 u[IU]/mL (ref 0.350–4.500)

## 2020-03-02 LAB — HIV ANTIBODY (ROUTINE TESTING W REFLEX): HIV Screen 4th Generation wRfx: NONREACTIVE

## 2020-03-02 LAB — MAGNESIUM: Magnesium: 2.1 mg/dL (ref 1.7–2.4)

## 2020-03-02 MED ORDER — AMIODARONE HCL 200 MG PO TABS
400.0000 mg | ORAL_TABLET | Freq: Two times a day (BID) | ORAL | Status: AC
  Administered 2020-03-02 – 2020-03-08 (×14): 400 mg via ORAL
  Filled 2020-03-02 (×14): qty 2

## 2020-03-02 MED ORDER — DIGOXIN 0.25 MG/ML IJ SOLN
0.2500 mg | Freq: Once | INTRAMUSCULAR | Status: AC
  Administered 2020-03-03: 0.25 mg via INTRAVENOUS
  Filled 2020-03-02: qty 2

## 2020-03-02 MED ORDER — DIGOXIN 0.25 MG/ML IJ SOLN
0.2500 mg | Freq: Once | INTRAMUSCULAR | Status: AC
  Administered 2020-03-02: 0.25 mg via INTRAVENOUS
  Filled 2020-03-02: qty 2

## 2020-03-02 MED ORDER — SODIUM CHLORIDE 0.9 % IV BOLUS: 500.0000 mL | Freq: Once | INTRAVENOUS | Status: DC

## 2020-03-02 MED ORDER — AMIODARONE HCL IN DEXTROSE 360-4.14 MG/200ML-% IV SOLN
30.0000 mg/h | INTRAVENOUS | Status: DC
  Administered 2020-03-02: 30 mg/h via INTRAVENOUS

## 2020-03-02 MED ORDER — AMIODARONE HCL IN DEXTROSE 360-4.14 MG/200ML-% IV SOLN
60.0000 mg/h | INTRAVENOUS | Status: AC
  Administered 2020-03-02: 60 mg/h via INTRAVENOUS
  Filled 2020-03-02 (×2): qty 200

## 2020-03-02 NOTE — Progress Notes (Signed)
   03/02/20 1639  Assess: MEWS Score  Temp (!) 97.5 F (36.4 C)  BP 96/80  Pulse Rate (!) 114  Resp 18  SpO2 98 %  O2 Device Room Air  Assess: MEWS Score  MEWS Temp 0  MEWS Systolic 1  MEWS Pulse 2  MEWS RR 0  MEWS LOC 0  MEWS Score 3  MEWS Score Color Yellow  Assess: if the MEWS score is Yellow or Red  Were vital signs taken at a resting state? Yes  Focused Assessment Change from prior assessment (see assessment flowsheet)  Early Detection of Sepsis Score *See Row Information* Low  MEWS guidelines implemented *See Row Information* Yes  Treat  MEWS Interventions Administered scheduled meds/treatments  Pain Scale 0-10  Pain Score 0  Take Vital Signs  Increase Vital Sign Frequency  Yellow: Q 2hr X 2 then Q 4hr X 2, if remains yellow, continue Q 4hrs  Escalate  MEWS: Escalate Yellow: discuss with charge nurse/RN and consider discussing with provider and RRT  Notify: Charge Nurse/RN  Name of Charge Nurse/RN Notified [name]  Date Charge Nurse/RN Notified 03/02/20  Time Charge Nurse/RN Notified 1700  Notify: Provider  Provider Name/Title Dr. Lady Gary  Date Provider Notified 03/02/20  Time Provider Notified 1730  Notification Type Page  Notification Reason Change in status  Response See new orders  Date of Provider Response 03/02/20  Time of Provider Response 1735  Document  Patient Outcome Other (Comment) (monitoring closely)  Progress note created (see row info) Yes

## 2020-03-02 NOTE — Progress Notes (Signed)
Pt in afib with RVR  HR 130-150's sustaining,  Night dose of PO amio given early. Dr. Lady Gary notiifed. Per MD monitor HR for one hour and we will assess.will continue to monitor.

## 2020-03-02 NOTE — Progress Notes (Signed)
Raymond [name] is a 59 y.o. male  Who has a past medical history of Epilepsy, Hyperlipidemia, Mental retardation, OCD (obsessive compulsive disorder), Severe mitral regurgitation who was brought in by EMS after he had a brief seizure was noted to be unresponsive. He is back to his baseline per care providers.  Objective:  Temp:  [97.4 F (36.3 C)-97.8 F (36.6 C)] 97.6 F (36.4 C) (08/04 1316) Pulse Rate:  [41-130] 103 (08/04 1316) Resp:  [16-22] 18 (08/04 1316) BP: (74-109)/(61-90) 102/79 (08/04 1316) SpO2:  [71 %-100 %] 97 % (08/04 1316) Weight:  [59.5 kg-60.6 kg] 60.6 kg (08/04 0330) Body mass index is 20.31 kg/m.  Neurologic Examination  MENTAL STATUS: Sleepy, opens eyes, looks at me briefly before going back to sleep. LANG/SPEECH: says yes when asked if he is okay. CRANIAL NERVES: II: Pupils equal and reactive III, IV, VI: Dysconjugate gaze, tracks my face briefly. V: regards touch in his face BL. VII: no asymmetry, no nasolabial fold flattening VIII: normal hearing to speech XII: midline tongue protrusion Moves all extremities. Squeezed my fingers on command and lifted both legs off the bed. Unable to do a detailed assessment due to somnolence.  SENSORY: Intact touch in all extremities.   Imaging/Labs/Diagnostics: Imaging/Labs/Diagnostics: MRI Brain from 10/08/19: 1. No acute intracranial abnormality. 2. Diffuse parenchymal volume loss, more pronounced than expected for age. Stable since prior MRI.  Impression and plan: Raymond [name] is a 59 y.o. male with PMH significant for Epilepsy, Hyperlipidemia, Mental retardation, OCD (obsessive compulsive disorder), Severe mitral regurgitation who was brought in by EMS after he had a brief seizure was noted to be unresponsive.  Now back to his baseline.  In addition to that, it seems like he has been developing some parkinsonian features which have been attributed to  Overall, it seems like his seizure frequency has  decreased once his Keppra dose was increased to 1000 mg twice daily from 500 mg twice daily.  He has not had any grand mal seizures since and has only had a couple breakthrough brief seizures.  Recs: -Continue increased dose of Keppra 1000mg  in AM and 1500mg  in PM. - Continue Valproic acid 500mg  TID -Total Valproic acid levels in the morning returned low normal. This might be because per chart, he got 2 doses of VPA instead of 3 yesterday. He seems to be on the right dosing today. Free Valproic acid levels pending. - He was noted to have Afibb with RVR yesterday which is being worked up by cardiology and primary team. - Neurology inpatient team will signoff. Please feel free to contact with any questions or concerns. - He should follow up with his outpatient neurologist within 1-2 weeks of discharge. - Patient's care takers were given an update on the phone today.  ______________________________________________________________________  Thank you for the opportunity to take part in the care of this patient. If you have any further questions, please contact the neurology consultation attending on call. Signed,   

## 2020-03-02 NOTE — Progress Notes (Signed)
Pt HR continues to be elevated. Pt appears to be asymtomatic. Dr. Lady Gary made aware. Per MD will put orders for one time dose of IV digoxin.will continue to monitor and pass on to oncoming nurse [place].

## 2020-03-02 NOTE — Progress Notes (Signed)
This note also relates to the following rows which could not be included: ECG Heart Rate - Cannot attach notes to unvalidated device data    03/01/20 2300  Assess: MEWS Score  BP (!) 89/69  Pulse Rate (!) 116  Assess: MEWS Score  MEWS Temp 0  MEWS Systolic 1  MEWS Pulse 2  MEWS RR 0  MEWS LOC 0  MEWS Score 3  MEWS Score Color Yellow  Assess: if the MEWS score is Yellow or Red  Were vital signs taken at a resting state? Yes  Focused Assessment No change from prior assessment  Early Detection of Sepsis Score *See Row Information* Low  MEWS guidelines implemented *See Row Information* No, previously yellow, continue vital signs every 4 hours  Treat  MEWS Interventions Escalated (See documentation below)  Take Vital Signs  Increase Vital Sign Frequency  Yellow: Q 2hr X 2 then Q 4hr X 2, if remains yellow, continue Q 4hrs  Escalate  MEWS: Escalate Yellow: discuss with charge nurse/RN and consider discussing with provider and RRT  Notify: Provider  Provider Name/Title Jon Billings NP  Date Provider Notified 03/01/20  Time Provider Notified 2306  Notification Type Page  Notification Reason Change in status  Response See new orders  Date of Provider Response 03/01/20  Time of Provider Response 2315  Document  Patient Outcome Stabilized after interventions  Progress note created (see row info) Yes

## 2020-03-02 NOTE — Progress Notes (Signed)
Patient Name: Raymond [name] Date of Encounter: 03/02/2020  Hospital Problem List     Principal Problem:   Acute on chronic diastolic CHF (congestive heart failure) (HCC) Active Problems:   Hyperlipidemia   HTN (hypertension)   GERD (gastroesophageal reflux disease)   New onset atrial fibrillation (HCC)   Atrial fibrillation with RVR (HCC)   Seizure disorder [facility])    Patient Profile     59 y.o. male with history of seizure disorder, hyperlipidemia, OCD with a history of severe MR admitted with seizure.  Given IV fluids and developed crackles consistent with congestive heart failure.  Was followed by [facility] with echocardiogram done in 2019 read as showing EF 55 to 60% with mitral valve prolapse and severe MR.  Left atrium is severely dilated.  More recent echocardiogram per Dr. Juliann Pares in February 2020 showing an EF of 40% with severe left atrial enlargement severe MR.  Was found to have global hypokinesis.  Last seen in March of this year and apparently was doing okay.  He was treated by neurology with Keppra 1000 twice daily as well as Depakote 500 mg 3 times daily with plans to possibly discontinue Depakote and start on Vimpat.  Presented to the ER yesterday with a seizure.  Patient is nonverbal.  Was given IV fluids 2 L over 9 hours developed crackles.  BNP was 550.1, 1.08, with a valproate level of 134.  Chest x-ray showed cardiomegaly with pulmonary venous congestion.  EKG showed atrial fibrillation with rapid ventricular response.  Echocardiogram from this visit is pending.  Patient is currently on carvedilol 3.125 mg twice daily, enteric-coated aspirin, carbamazepine, divalproex, losartan 25 mg daily, Risperdal.  Subjective   Nonverbal  Inpatient Medications    . amiodarone  400 mg Oral BID  . aspirin EC  81 mg Oral Daily  . cholecalciferol  2,000 Units Oral Daily  . divalproex  500 mg Oral TID WC  . enoxaparin (LOVENOX) injection  40 mg Subcutaneous Q24H  . famotidine  40  mg Oral QHS  . fenofibrate  160 mg Oral Daily  . lactose free nutrition  237 mL Oral Daily  . levETIRAcetam  1,500 mg Oral QPM   And  . levETIRAcetam  1,000 mg Oral Daily  . loratadine  10 mg Oral Daily  . multivitamin with minerals  1 tablet Oral Daily  . pantoprazole  40 mg Oral Daily  . psyllium  1 packet Oral Daily  . risperiDONE  1 mg Oral Daily  . risperiDONE  2 mg Oral QHS  . sodium chloride flush  3 mL Intravenous Q12H  . vitamin B-12  1,000 mcg Oral Daily  . [START ON 03/04/2020] Vitamin D (Ergocalciferol)  50,000 Units Oral Weekly    Vital Signs    Vitals:   03/02/20 0701 03/02/20 0721 03/02/20 1129 03/02/20 1316  BP: (!) 81/65  (!) 85/70 102/79  Pulse: 97  (!) 107 (!) 103  Resp:    18  Temp:  (!) 97.4 F (36.3 C) 97.6 F (36.4 C) 97.6 F (36.4 C)  TempSrc:  Oral Axillary Oral  SpO2: 96%  95% 97%  Weight:      Height:        Intake/Output Summary (Last 24 hours) at 03/02/2020 1429 Last data filed at 03/02/2020 0700 Gross per 24 hour  Intake 259.33 ml  Output 0 ml  Net 259.33 ml   Filed Weights   02/29/20 2206 03/01/20 1828 03/02/20 0330  Weight: 63.7 kg  59.5 kg 60.6 kg    Physical Exam    GEN: Well nourished, well developed, in no acute distress.  HEENT: normal.  Neck: Supple, no JVD, carotid bruits, or masses. Cardiac: RRR, no murmurs, rubs, or gallops. No clubbing, cyanosis, edema.  Radials/DP/PT 2+ and equal bilaterally.  Respiratory:  Respirations regular and unlabored, clear to auscultation bilaterally. GI: Soft, nontender, nondistended, BS + x 4. MS: no deformity or atrophy. Skin: warm and dry, no rash. Neuro:  Strength and sensation are intact. Psych: Normal affect.  Labs    CBC Recent Labs    02/29/20 2218 03/02/20 0617  WBC 6.4 6.1  HGB 10.9* 10.3*  HCT 32.1* 31.1*  MCV 93.9 96.6  PLT 160 141*   Basic Metabolic Panel Recent Labs    69/62/9506/09/19 2218 03/02/20 0617  NA 136 137  K 4.7 4.2  CL 107 105  CO2 20* 22  GLUCOSE 103* 121*   BUN 17 18  CREATININE 1.05 1.13  CALCIUM 8.4* 8.6*  MG  --  2.1   Liver Function Tests Recent Labs    02/29/20 2218  AST 21  ALT 13  ALKPHOS 21*  BILITOT 0.7  PROT 6.2*  ALBUMIN 3.7   No results for input(s): LIPASE, AMYLASE in the last 72 hours. Cardiac Enzymes No results for input(s): CKTOTAL, CKMB, CKMBINDEX, TROPONINI in the last 72 hours. BNP Recent Labs    03/01/20 0622  BNP 550.7*   D-Dimer No results for input(s): DDIMER in the last 72 hours. Hemoglobin A1C No results for input(s): HGBA1C in the last 72 hours. Fasting Lipid Panel No results for input(s): CHOL, HDL, LDLCALC, TRIG, CHOLHDL, LDLDIRECT in the last 72 hours. Thyroid Function Tests Recent Labs    03/02/20 0617  TSH 2.268    Telemetry    Atrial fibrillation with controlled ventricular response  ECG    Atrial fibrillation with rapid ventricular response  Radiology    DG Chest 2 View  Result Date: 03/01/2020 CLINICAL DATA:  Cough.  Tachycardia. EXAM: CHEST - 2 VIEW COMPARISON:  CT abdomen 10/07/2019.  Chest x-ray 07/22/2016. FINDINGS: Cardiomegaly with pulmonary venous congestion and bilateral interstitial prominence most consistent interstitial edema. Pneumonitis cannot be excluded. No pleural effusion or pneumothorax. Pulmonary nodule identified on prior CT of 10/07/2019 best identified by prior CT. No acute bony abnormality. IMPRESSION: Cardiomegaly with pulmonary venous congestion and bilateral interstitial prominence most consistent with congestive heart failure with interstitial edema. No pleural effusion. Electronically Signed   By: Maisie Fushomas  Register   On: 03/01/2020 06:04   ECHOCARDIOGRAM COMPLETE  Result Date: 03/01/2020    ECHOCARDIOGRAM REPORT   Patient Name:   Raymond [name] Date of Exam: 03/01/2020 Medical Rec #:  [id number]        Height:       68.0 in Accession #:    [id number]       Weight:       140.4 lb Date of Birth:  1960/09/08       BSA:          1.759 m Patient Age:    59  years         BP:           103/79 mmHg Patient Gender: M                HR:           114 bpm. Exam Location:  [facility] Procedure: 2D Echo, Color Doppler and Cardiac Doppler Indications:  CHF- acute diastolic 428.31  History:         Patient has prior history of Echocardiogram examinations, most                  recent 10/29/2017. Signs/Symptoms:Murmur. Severe Mitral                  regurgitation.  Sonographer:     Cristela Blue RDCS (AE) Referring Phys:  [id number] Brien Few NIU Diagnosing Phys: Harold Hedge MD IMPRESSIONS  1. Left ventricular ejection fraction, by estimation, is 40 to 45%. Left ventricular ejection fraction by 2D MOD biplane is 34.5 %. The left ventricle has mildly decreased function. The left ventricle demonstrates global hypokinesis. Left ventricular diastolic parameters were normal.  2. Right ventricular systolic function is normal. The right ventricular size is normal. There is normal pulmonary artery systolic pressure.  3. Left atrial size was moderately dilated.  4. Right atrial size was mildly dilated.  5. The mitral valve is abnormal. Moderate to severe mitral valve regurgitation.  6. The aortic valve was not well visualized. Aortic valve regurgitation is trivial. FINDINGS  Left Ventricle: Left ventricular ejection fraction, by estimation, is 40 to 45%. Left ventricular ejection fraction by 2D MOD biplane is 34.5 %. The left ventricle has mildly decreased function. The left ventricle demonstrates global hypokinesis. The left ventricular internal cavity size was normal in size. There is borderline left ventricular hypertrophy. Left ventricular diastolic parameters were normal. Right Ventricle: The right ventricular size is normal. No increase in right ventricular wall thickness. Right ventricular systolic function is normal. There is normal pulmonary artery systolic pressure. The tricuspid regurgitant velocity is 2.43 m/s, and  with an assumed right atrial pressure of 10 mmHg, the estimated right  ventricular systolic pressure is 33.6 mmHg. Left Atrium: Left atrial size was moderately dilated. Right Atrium: Right atrial size was mildly dilated. Pericardium: There is no evidence of pericardial effusion. Mitral Valve: The mitral valve is abnormal. There is moderate prolapse of the medial scallop of the posterior leaflet of the mitral valve. Moderate to severe mitral valve regurgitation. MV peak gradient, 8.8 mmHg. The mean mitral valve gradient is 4.0 mmHg. Tricuspid Valve: The tricuspid valve is grossly normal. Tricuspid valve regurgitation is trivial. Aortic Valve: The aortic valve was not well visualized. Aortic valve regurgitation is trivial. Aortic valve mean gradient measures 15.7 mmHg. Aortic valve peak gradient measures 24.5 mmHg. Aortic valve area, by VTI measures 0.73 cm. Pulmonic Valve: The pulmonic valve was not well visualized. Pulmonic valve regurgitation is trivial. Aorta: The aortic root is normal in size and structure. IAS/Shunts: The interatrial septum was not assessed.  LEFT VENTRICLE PLAX 2D                        Biplane EF (MOD) LVIDd:         6.07 cm         LV Biplane EF:   Left LVIDs:         5.56 cm                          ventricular LV PW:         1.66 cm                          ejection LV IVS:        0.96 cm  fraction by LVOT diam:     2.30 cm                          2D MOD LV SV:         35                               biplane is LV SV Index:   20                               34.5 %. LVOT Area:     4.15 cm                                Diastology                                LV e' lateral:   12.20 cm/s LV Volumes (MOD)               LV E/e' lateral: 8.4 LV vol d, MOD    133.0 ml      LV e' medial:    11.10 cm/s A2C:                           LV E/e' medial:  9.2 LV vol d, MOD    169.0 ml A4C: LV vol s, MOD    93.1 ml A2C: LV vol s, MOD    99.5 ml A4C: LV SV MOD A2C:   39.9 ml LV SV MOD A4C:   169.0 ml LV SV MOD BP:    53.6 ml RIGHT VENTRICLE RV S  prime:     11.60 cm/s TAPSE (M-mode): 2.4 cm LEFT ATRIUM            Index       RIGHT ATRIUM           Index LA diam:      5.60 cm  3.18 cm/m  RA Area:     12.20 cm LA Vol (A2C): 98.9 ml  56.24 ml/m RA Volume:   26.50 ml  15.07 ml/m LA Vol (A4C): 173.0 ml 98.37 ml/m  AORTIC VALVE                    PULMONIC VALVE AV Area (Vmax):    0.73 cm     RVOT Peak grad: 3 mmHg AV Area (Vmean):   0.58 cm AV Area (VTI):     0.73 cm AV Vmax:           247.67 cm/s AV Vmean:          179.000 cm/s AV VTI:            0.485 m AV Peak Grad:      24.5 mmHg AV Mean Grad:      15.7 mmHg LVOT Vmax:         43.60 cm/s LVOT Vmean:        25.000 cm/s LVOT VTI:          0.085 m LVOT/AV VTI ratio: 0.17  AORTA Ao Root diam: 2.90 cm MITRAL VALVE  TRICUSPID VALVE MV Area (PHT): 4.36 cm     TR Peak grad:   23.6 mmHg MV Peak grad:  8.8 mmHg     TR Vmax:        243.00 cm/s MV Mean grad:  4.0 mmHg MV Vmax:       1.48 m/s     SHUNTS MV Vmean:      82.1 cm/s    Systemic VTI:  0.08 m MV Decel Time: 174 msec     Systemic Diam: 2.30 cm MV E velocity: 102.00 cm/s MV A velocity: 37.80 cm/s MV E/A ratio:  2.70 Harold Hedge MD Electronically signed by Harold Hedge MD Signature Date/Time: 03/01/2020/12:35:00 PM    Final     Assessment & Plan    59 y.o. male with history of seizure disorder, hyperlipidemia, OCD with a history of severe MR admitted with seizure.  Given IV fluids and developed crackles consistent with congestive heart failure.  Was followed by [facility] with echocardiogram done in 2019 read as showing EF 55 to 60% with mitral valve prolapse and severe MR.  Left atrium is severely dilated.  More recent echocardiogram per Dr. Juliann Pares in February 2020 showing an EF of 40% with severe left atrial enlargement severe MR.  Was found to have global hypokinesis.  Last seen in March of this year and apparently was doing okay.  He was treated by neurology with Keppra 1000 twice daily as well as Depakote 500 mg 3 times daily with plans  to possibly discontinue Depakote and start on Vimpat.  Presented to the ER yesterday with a seizure.  Patient is nonverbal.  Was given IV fluids 2 L over 9 hours developed crackles.  BNP was 550.1, 1.08, with a valproate level of 134.  Chest x-ray showed cardiomegaly with pulmonary venous congestion.  EKG showed atrial fibrillation with rapid ventricular response.  Echocardiogram from this visit is pending.  Patient is currently on carvedilol 3.125 mg twice daily, enteric-coated aspirin, carbamazepine, divalproex, losartan 25 mg daily, Risperdal.   A. fib-he was placed on IV amiodarone last night for recurrent rapid A. fib.  Blood pressure is reduced rate is controlled.  May be secondary to the IV form of amiodarone.  We will discontinue IV amiodarone and placed on p.o. amiodarone 200 mg twice daily.  We will follow her rate.  Not an ideal candidate for anticoagulation at present  Hypotension-does not appear symptomatic despite systolic pressures between 80 and 100.  Currently 102.  Does not appear symptomatic.  We will continue to follow as his blood pressure currently is over 100 systolic.  Mitral valve disease-moderate severe MR with global hypokinesis.  We will continue with medical management.  Not an ideal candidate for surgical intervention.  Congestive heart failure-has improved.  Will check BNP tomorrow. Signed, Darlin Priestly Niema Carrara MD 03/02/2020, 2:29 PM  Pager: [phone]

## 2020-03-02 NOTE — Progress Notes (Addendum)
Pt currently on amio drip at maintenance however hypotensive, BP is 85/70 MAP 77 pulse 107, hypontensive overnight, pt nonverbal, and mentally challenge, Dr. Lady Gary aware.per MD will switch pt to PO and will come assess pt. Will continue to assess.

## 2020-03-02 NOTE — Progress Notes (Signed)
   03/01/20 2001  Assess: MEWS Score  BP 100/80  Pulse Rate (!) 124  Resp 18  SpO2 98 %  Assess: MEWS Score  MEWS Temp 0  MEWS Systolic 1  MEWS Pulse 2  MEWS RR 0  MEWS LOC 0  MEWS Score 3  MEWS Score Color Yellow  Assess: if the MEWS score is Yellow or Red  Were vital signs taken at a resting state? Yes  Focused Assessment Change from prior assessment (see assessment flowsheet)  Early Detection of Sepsis Score *See Row Information* Low  MEWS guidelines implemented *See Row Information* Yes  Treat  MEWS Interventions Escalated (See documentation below)  Breathing 0  Negative Vocalization 0  Facial Expression 0  Body Language 0  Consolability 0  PAINAD Score 0  Take Vital Signs  Increase Vital Sign Frequency  Yellow: Q 2hr X 2 then Q 4hr X 2, if remains yellow, continue Q 4hrs  Escalate  MEWS: Escalate Yellow: discuss with charge nurse/RN and consider discussing with provider and RRT  Notify: Provider  Provider Name/Title Jon Billings NP  Date Provider Notified 03/01/20  Time Provider Notified 2006  Notification Type Page  Notification Reason Change in status  Response See new orders  Date of Provider Response 03/01/20  Time of Provider Response 2013  Document  Patient Outcome Not stable and remains on department

## 2020-03-02 NOTE — Progress Notes (Signed)
PT Cancellation Note  Patient Details Name: GRAIG HESSLING MRN: [id number] DOB: December 29, 1960   Cancelled Treatment:    Reason Eval/Treat Not Completed: Patient not medically ready.  PT consult received.  Chart reviewed.  Nurse recommending holding PT at this time  (pt has had low BP concerns that they are addressing) and to retry at a later date/time.  Hendricks Limes, PT 03/02/20, 3:46 PM

## 2020-03-02 NOTE — Progress Notes (Signed)
Patient ID: Raymond [name] Raymond [name], male   DOB: 1961-07-14, 59 y.o.   MRN: [id number] [facility] PROGRESS NOTE  Raymond [name] Pauli ONG:[id number] DOB: 1961-07-14 DOA: 02/29/2020 PCP: Raymond [name], [facility]  HPI/Subjective: Patient answers some yes or no questions.  Patient was brought in secondary to seizure.  Patient also had rapid atrial fibrillation.  Last night requiring amiodarone drip.  Objective: Vitals:   03/02/20 1129 03/02/20 1316  BP: (!) 85/70 102/79  Pulse: (!) 107 (!) 103  Resp:  18  Temp: 97.6 F (36.4 C) 97.6 F (36.4 C)  SpO2: 95% 97%    Intake/Output Summary (Last 24 hours) at 03/02/2020 1538 Last data filed at 03/02/2020 0700 Gross per 24 hour  Intake 259.33 ml  Output 0 ml  Net 259.33 ml   Filed Weights   02/29/20 2206 03/01/20 1828 03/02/20 0330  Weight: 63.7 kg 59.5 kg 60.6 kg    ROS: Review of Systems  Respiratory: Negative for shortness of breath.   Cardiovascular: Negative for chest pain.  Gastrointestinal: Negative for abdominal pain, nausea and vomiting.  Genitourinary: Negative for dysuria.  Musculoskeletal: Negative for back pain.   Exam: Physical Exam HENT:     Nose: No mucosal edema.     Mouth/Throat:     Pharynx: No oropharyngeal exudate.  Eyes:     General: Lids are normal.     Conjunctiva/sclera: Conjunctivae normal.     Pupils: Pupils are equal, round, and reactive to light.  Cardiovascular:     Rate and Rhythm: Normal rate and regular rhythm.     Heart sounds: Normal heart sounds, S1 normal and S2 normal.  Pulmonary:     Breath sounds: No decreased breath sounds, wheezing, rhonchi or rales.  Abdominal:     Palpations: Abdomen is soft.     Tenderness: There is no abdominal tenderness.  Musculoskeletal:     Right ankle: No swelling.     Left ankle: No swelling.  Skin:    General: Skin is warm.     Findings: No rash.  Neurological:     Mental Status: He is alert.       Data Reviewed: Basic Metabolic Panel: Recent Labs   Lab 02/29/20 2218 03/02/20 0617  NA 136 137  K 4.7 4.2  CL 107 105  CO2 20* 22  GLUCOSE 103* 121*  BUN 17 18  CREATININE 1.05 1.13  CALCIUM 8.4* 8.6*  MG  --  2.1   Liver Function Tests: Recent Labs  Lab 02/29/20 2218  AST 21  ALT 13  ALKPHOS 21*  BILITOT 0.7  PROT 6.2*  ALBUMIN 3.7   CBC: Recent Labs  Lab 02/29/20 2218 03/02/20 0617  WBC 6.4 6.1  HGB 10.9* 10.3*  HCT 32.1* 31.1*  MCV 93.9 96.6  PLT 160 141*   BNP (last 3 results) Recent Labs    03/01/20 0622  BNP 550.7*      Recent Results (from the past 240 hour(s))  SARS Coronavirus 2 by RT PCR (hospital order, [facility]) Nasopharyngeal Nasopharyngeal Swab     Status: None   Collection Time: 03/01/20  6:22 AM   Specimen: Nasopharyngeal Swab  Result Value Ref Range Status   SARS Coronavirus 2 NEGATIVE NEGATIVE Final    Comment: (NOTE) SARS-CoV-2 target nucleic acids are NOT DETECTED.  The SARS-CoV-2 RNA is generally detectable in upper and lower respiratory specimens during the acute phase of infection. The lowest concentration of SARS-CoV-2 viral copies this assay can detect is  250 copies / mL. A negative result does not preclude SARS-CoV-2 infection and should not be used as the sole basis for treatment or other patient management decisions.  A negative result may occur with improper specimen collection / handling, submission of specimen other than nasopharyngeal swab, presence of viral mutation(s) within the areas targeted by this assay, and inadequate number of viral copies (<250 copies / mL). A negative result must be combined with clinical observations, patient history, and epidemiological information.  Fact Sheet for Patients:   BoilerBrush.com.cy  Fact Sheet for Healthcare Providers: https://pope.com/  This test is not yet approved or  cleared by the [place] FDA and has been authorized for detection  and/or diagnosis of SARS-CoV-2 by FDA under an Emergency Use Authorization (EUA).  This EUA will remain in effect (meaning this test can be used) for the duration of the COVID-19 declaration under Section 564(b)(1) of the Act, 21 U.S.C. section 360bbb-3(b)(1), unless the authorization is terminated or revoked sooner.  Performed at [facility], [address] [place], [place] [address]   MRSA PCR Screening     Status: None   Collection Time: 03/01/20  6:36 PM   Specimen: Nasopharyngeal  Result Value Ref Range Status   MRSA by PCR NEGATIVE NEGATIVE Final    Comment:        The GeneXpert MRSA Assay (FDA approved for NASAL specimens only), is one component of a comprehensive MRSA colonization surveillance program. It is not intended to diagnose MRSA infection nor to guide or monitor treatment for MRSA infections. Performed at [facility], [address] [place], [place] [address]      Studies: DG Chest 2 View  Result Date: 03/01/2020 CLINICAL DATA:  Cough.  Tachycardia. EXAM: CHEST - 2 VIEW COMPARISON:  CT abdomen 10/07/2019.  Chest x-ray 07/22/2016. FINDINGS: Cardiomegaly with pulmonary venous congestion and bilateral interstitial prominence most consistent interstitial edema. Pneumonitis cannot be excluded. No pleural effusion or pneumothorax. Pulmonary nodule identified on prior CT of 10/07/2019 best identified by prior CT. No acute bony abnormality. IMPRESSION: Cardiomegaly with pulmonary venous congestion and bilateral interstitial prominence most consistent with congestive heart failure with interstitial edema. No pleural effusion. Electronically Signed   By: Maisie Fus  Register   On: 03/01/2020 06:04   ECHOCARDIOGRAM COMPLETE  Result Date: 03/01/2020    ECHOCARDIOGRAM REPORT   Patient Name:   Raymond [name] Date of Exam: 03/01/2020 Medical Rec #:  [id number]        Height:       68.0 in Accession #:    [id number]       Weight:       140.4 lb Date of Birth:   13-Jun-1961       BSA:          1.759 m Patient Age:    58 years         BP:           103/79 mmHg Patient Gender: M                HR:           114 bpm. Exam Location:  [facility] Procedure: 2D Echo, Color Doppler and Cardiac Doppler Indications:     CHF- acute diastolic 428.31  History:         Patient has prior history of Echocardiogram examinations, most                  recent 10/29/2017. Signs/Symptoms:Murmur. Severe Mitral  regurgitation.  Sonographer:     Cristela Blue RDCS (AE) Referring Phys:  [id number] Brien Few NIU Diagnosing Phys: Harold Hedge MD IMPRESSIONS  1. Left ventricular ejection fraction, by estimation, is 40 to 45%. Left ventricular ejection fraction by 2D MOD biplane is 34.5 %. The left ventricle has mildly decreased function. The left ventricle demonstrates global hypokinesis. Left ventricular diastolic parameters were normal.  2. Right ventricular systolic function is normal. The right ventricular size is normal. There is normal pulmonary artery systolic pressure.  3. Left atrial size was moderately dilated.  4. Right atrial size was mildly dilated.  5. The mitral valve is abnormal. Moderate to severe mitral valve regurgitation.  6. The aortic valve was not well visualized. Aortic valve regurgitation is trivial. FINDINGS  Left Ventricle: Left ventricular ejection fraction, by estimation, is 40 to 45%. Left ventricular ejection fraction by 2D MOD biplane is 34.5 %. The left ventricle has mildly decreased function. The left ventricle demonstrates global hypokinesis. The left ventricular internal cavity size was normal in size. There is borderline left ventricular hypertrophy. Left ventricular diastolic parameters were normal. Right Ventricle: The right ventricular size is normal. No increase in right ventricular wall thickness. Right ventricular systolic function is normal. There is normal pulmonary artery systolic pressure. The tricuspid regurgitant velocity is 2.43 m/s, and  with an assumed  right atrial pressure of 10 mmHg, the estimated right ventricular systolic pressure is 33.6 mmHg. Left Atrium: Left atrial size was moderately dilated. Right Atrium: Right atrial size was mildly dilated. Pericardium: There is no evidence of pericardial effusion. Mitral Valve: The mitral valve is abnormal. There is moderate prolapse of the medial scallop of the posterior leaflet of the mitral valve. Moderate to severe mitral valve regurgitation. MV peak gradient, 8.8 mmHg. The mean mitral valve gradient is 4.0 mmHg. Tricuspid Valve: The tricuspid valve is grossly normal. Tricuspid valve regurgitation is trivial. Aortic Valve: The aortic valve was not well visualized. Aortic valve regurgitation is trivial. Aortic valve mean gradient measures 15.7 mmHg. Aortic valve peak gradient measures 24.5 mmHg. Aortic valve area, by VTI measures 0.73 cm. Pulmonic Valve: The pulmonic valve was not well visualized. Pulmonic valve regurgitation is trivial. Aorta: The aortic root is normal in size and structure. IAS/Shunts: The interatrial septum was not assessed.  LEFT VENTRICLE PLAX 2D                        Biplane EF (MOD) LVIDd:         6.07 cm         LV Biplane EF:   Left LVIDs:         5.56 cm                          ventricular LV PW:         1.66 cm                          ejection LV IVS:        0.96 cm                          fraction by LVOT diam:     2.30 cm                          2D MOD LV SV:  35                               biplane is LV SV Index:   20                               34.5 %. LVOT Area:     4.15 cm                                Diastology                                LV e' lateral:   12.20 cm/s LV Volumes (MOD)               LV E/e' lateral: 8.4 LV vol d, MOD    133.0 ml      LV e' medial:    11.10 cm/s A2C:                           LV E/e' medial:  9.2 LV vol d, MOD    169.0 ml A4C: LV vol s, MOD    93.1 ml A2C: LV vol s, MOD    99.5 ml A4C: LV SV MOD A2C:   39.9 ml LV SV MOD A4C:    169.0 ml LV SV MOD BP:    53.6 ml RIGHT VENTRICLE RV S prime:     11.60 cm/s TAPSE (M-mode): 2.4 cm LEFT ATRIUM            Index       RIGHT ATRIUM           Index LA diam:      5.60 cm  3.18 cm/m  RA Area:     12.20 cm LA Vol (A2C): 98.9 ml  56.24 ml/m RA Volume:   26.50 ml  15.07 ml/m LA Vol (A4C): 173.0 ml 98.37 ml/m  AORTIC VALVE                    PULMONIC VALVE AV Area (Vmax):    0.73 cm     RVOT Peak grad: 3 mmHg AV Area (Vmean):   0.58 cm AV Area (VTI):     0.73 cm AV Vmax:           247.67 cm/s AV Vmean:          179.000 cm/s AV VTI:            0.485 m AV Peak Grad:      24.5 mmHg AV Mean Grad:      15.7 mmHg LVOT Vmax:         43.60 cm/s LVOT Vmean:        25.000 cm/s LVOT VTI:          0.085 m LVOT/AV VTI ratio: 0.17  AORTA Ao Root diam: 2.90 cm MITRAL VALVE                TRICUSPID VALVE MV Area (PHT): 4.36 cm     TR Peak grad:   23.6 mmHg MV Peak grad:  8.8 mmHg     TR Vmax:        243.00 cm/s MV Mean grad:  4.0  mmHg MV Vmax:       1.48 m/s     SHUNTS MV Vmean:      82.1 cm/s    Systemic VTI:  0.08 m MV Decel Time: 174 msec     Systemic Diam: 2.30 cm MV E velocity: 102.00 cm/s MV A velocity: 37.80 cm/s MV E/A ratio:  2.70 Harold Hedge MD Electronically signed by Harold Hedge MD Signature Date/Time: 03/01/2020/12:35:00 PM    Final     Scheduled Meds: . amiodarone  400 mg Oral BID  . aspirin EC  81 mg Oral Daily  . cholecalciferol  2,000 Units Oral Daily  . divalproex  500 mg Oral TID WC  . enoxaparin (LOVENOX) injection  40 mg Subcutaneous Q24H  . famotidine  40 mg Oral QHS  . fenofibrate  160 mg Oral Daily  . lactose free nutrition  237 mL Oral Daily  . levETIRAcetam  1,500 mg Oral QPM   And  . levETIRAcetam  1,000 mg Oral Daily  . loratadine  10 mg Oral Daily  . multivitamin with minerals  1 tablet Oral Daily  . pantoprazole  40 mg Oral Daily  . psyllium  1 packet Oral Daily  . risperiDONE  1 mg Oral Daily  . risperiDONE  2 mg Oral QHS  . sodium chloride flush  3 mL  Intravenous Q12H  . vitamin B-12  1,000 mcg Oral Daily  . [START ON 03/04/2020] Vitamin D (Ergocalciferol)  50,000 Units Oral Weekly   Continuous Infusions: . sodium chloride    . sodium chloride      Assessment/Plan:  1. Rapid atrial fibrillation and hypotension.  Coreg needed to be discontinued this morning.  The IV amiodarone could be contributing to the hypotension this was switched over to oral. Appreciate cardiology evaluation. 2. Seizure.  Continue Keppra and valproic acid 3. Acute on chronic diastolic congestive heart failure with mitral regurgitation.  Received 1 dose of Lasix.  Continue to monitor closely. 4. Hypertriglyceridemia on fenofibrate 5. GERD on Pepcid and Protonix 6. Weakness.  Physical therapy evaluation     Code Status:     Code Status Orders  (From admission, onward)         Start     Ordered   03/01/20 0739  Full code  Continuous        03/01/20 0739        Code Status History    This patient has a current code status but no historical code status.   Advance Care Planning Activity     Family Communication: Left message for main contact in the computer Disposition Plan: Status is: Inpatient  Dispo: The patient is from: Group home              Anticipated d/c is to: Group home              Anticipated d/c date is: Potential 03/03/2020              Patient currently being treated for rapid atrial fibrillation and hypotension.  Trying to balance both of these factors.  Patient currently on amiodarone high-dose.  Consultants:  Cardiology  Neurology signed off  Time spent: 28 minutes  Wanda Cellucci [employer]

## 2020-03-03 ENCOUNTER — Inpatient Hospital Stay: Payer: Medicare Other

## 2020-03-03 ENCOUNTER — Encounter: Payer: Medicare Other | Admitting: Physical Therapy

## 2020-03-03 DIAGNOSIS — R531 Weakness: Secondary | ICD-10-CM

## 2020-03-03 LAB — BASIC METABOLIC PANEL
Anion gap: 9 (ref 5–15)
BUN: 18 mg/dL (ref 6–20)
CO2: 21 mmol/L — ABNORMAL LOW (ref 22–32)
Calcium: 8.8 mg/dL — ABNORMAL LOW (ref 8.9–10.3)
Chloride: 108 mmol/L (ref 98–111)
Creatinine, Ser: 1.29 mg/dL — ABNORMAL HIGH (ref 0.61–1.24)
GFR calc Af Amer: >60 mL/min (ref >60)
GFR calc non Af Amer: >60 mL/min (ref >60)
Glucose, Bld: 102 mg/dL — ABNORMAL HIGH (ref 70–99)
Potassium: 4.7 mmol/L (ref 3.5–5.1)
Sodium: 138 mmol/L (ref 135–145)

## 2020-03-03 LAB — LEVETIRACETAM LEVEL: Levetiracetam Lvl: 42 ug/mL — ABNORMAL HIGH (ref 10.0–40.0)

## 2020-03-03 LAB — VALPROIC ACID LEVEL, FREE: Valproic Acid, Free: 8.2 ug/mL (ref 6.0–22.0)

## 2020-03-03 MED ORDER — FUROSEMIDE 10 MG/ML IJ SOLN
20.0000 mg | Freq: Once | INTRAMUSCULAR | Status: AC
  Administered 2020-03-03: 20 mg via INTRAVENOUS
  Filled 2020-03-03: qty 2

## 2020-03-03 MED ORDER — ALBUTEROL SULFATE (2.5 MG/3ML) 0.083% IN NEBU
2.5000 mg | INHALATION_SOLUTION | Freq: Four times a day (QID) | RESPIRATORY_TRACT | Status: DC
  Administered 2020-03-03 – 2020-03-04 (×5): 2.5 mg via RESPIRATORY_TRACT
  Filled 2020-03-03 (×5): qty 3

## 2020-03-03 MED ORDER — FUROSEMIDE 10 MG/ML IJ SOLN
20.0000 mg | Freq: Two times a day (BID) | INTRAMUSCULAR | Status: DC
  Administered 2020-03-03 – 2020-03-04 (×2): 20 mg via INTRAVENOUS
  Filled 2020-03-03 (×2): qty 2

## 2020-03-03 MED ORDER — DIGOXIN 125 MCG PO TABS
0.1250 mg | ORAL_TABLET | Freq: Every day | ORAL | Status: DC
  Administered 2020-03-03 – 2020-03-04 (×2): 0.125 mg via ORAL
  Filled 2020-03-03 (×3): qty 1

## 2020-03-03 NOTE — Progress Notes (Signed)
Patient ID: Raymond [name], male   DOB: 1960/11/12, 59 y.o.   MRN: [id number] [facility] PROGRESS NOTE  NOLLAN MULDROW [id number] DOB: [birth year] DOA: 02/29/2020 PCP: [name]  HPI/Subjective: Patient coming from group home.  In speaking with the group home owner [name], the patient needs to be able to walk in order to come back to the group home.  Today the patient had audible noises coming from the lungs.  The patient answers some yes or no questions.  Patient was admitted with seizure and also had rapid atrial fibrillation.  Objective: Vitals:   03/03/20 1100 03/03/20 1215  BP:  (!) 89/62  Pulse:  (!) 103  Resp:  14  Temp:  98.2 F (36.8 C)  SpO2: 100% 96%    Intake/Output Summary (Last 24 hours) at 03/03/2020 1321 Last data filed at 03/03/2020 0700 Gross per 24 hour  Intake 110.87 ml  Output 800 ml  Net -689.13 ml   Filed Weights   03/01/20 1828 03/02/20 0330 03/03/20 0401  Weight: 59.5 kg 60.6 kg 61 kg    ROS: Review of Systems  Respiratory: Positive for cough. Negative for shortness of breath.   Cardiovascular: Negative for chest pain.  Gastrointestinal: Negative for abdominal pain.   Exam: Physical Exam HENT:     Nose: No mucosal edema.     Mouth/Throat:     Pharynx: No oropharyngeal exudate.  Eyes:     General: Lids are normal.     Conjunctiva/sclera: Conjunctivae normal.     Pupils: Pupils are equal, round, and reactive to light.  Cardiovascular:     Rate and Rhythm: Tachycardia present. Rhythm irregularly irregular.     Heart sounds: Normal heart sounds, S1 normal and S2 normal.  Pulmonary:     Breath sounds: Examination of the right-middle field reveals decreased breath sounds and wheezing. Examination of the left-middle field reveals decreased breath sounds and wheezing. Examination of the right-lower field reveals decreased breath sounds and rhonchi. Examination of the left-lower field reveals decreased breath sounds and rhonchi.  Decreased breath sounds, wheezing and rhonchi present. No rales.  Abdominal:     Palpations: Abdomen is soft.     Tenderness: There is no abdominal tenderness.  Musculoskeletal:     Right ankle: No swelling.     Left ankle: No swelling.  Skin:    General: Skin is warm.     Findings: No rash.  Neurological:     Mental Status: He is alert.       Data Reviewed: Basic Metabolic Panel: Recent Labs  Lab 02/29/20 2218 03/02/20 0617 03/03/20 0729  NA 136 137 138  K 4.7 4.2 4.7  CL 107 105 108  CO2 20* 22 21*  GLUCOSE 103* 121* 102*  BUN 17 18 18   CREATININE 1.05 1.13 1.29*  CALCIUM 8.4* 8.6* 8.8*  MG  --  2.1  --    Liver Function Tests: Recent Labs  Lab 02/29/20 2218  AST 21  ALT 13  ALKPHOS 21*  BILITOT 0.7  PROT 6.2*  ALBUMIN 3.7   CBC: Recent Labs  Lab 02/29/20 2218 03/02/20 0617  WBC 6.4 6.1  HGB 10.9* 10.3*  HCT 32.1* 31.1*  MCV 93.9 96.6  PLT 160 141*   BNP (last 3 results) Recent Labs    03/01/20 0622  BNP 550.7*     Recent Results (from the past 240 hour(s))  SARS Coronavirus 2 by RT PCR (hospital order, performed in [facility] hospital lab) Nasopharyngeal Nasopharyngeal  Swab     Status: None   Collection Time: 03/01/20  6:22 AM   Specimen: Nasopharyngeal Swab  Result Value Ref Range Status   SARS Coronavirus 2 NEGATIVE NEGATIVE Final    Comment: (NOTE) SARS-CoV-2 target nucleic acids are NOT DETECTED.  The SARS-CoV-2 RNA is generally detectable in upper and lower respiratory specimens during the acute phase of infection. The lowest concentration of SARS-CoV-2 viral copies this assay can detect is 250 copies / mL. A negative result does not preclude SARS-CoV-2 infection and should not be used as the sole basis for treatment or other patient management decisions.  A negative result may occur with improper specimen collection / handling, submission of specimen other than nasopharyngeal swab, presence of viral mutation(s) within  the areas targeted by this assay, and inadequate number of viral copies (<250 copies / mL). A negative result must be combined with clinical observations, patient history, and epidemiological information.  Fact Sheet for Patients:   BoilerBrush.com.cy  Fact Sheet for Healthcare Providers: https://pope.com/  This test is not yet approved or  cleared by the [place] FDA and has been authorized for detection and/or diagnosis of SARS-CoV-2 by FDA under an Emergency Use Authorization (EUA).  This EUA will remain in effect (meaning this test can be used) for the duration of the COVID-19 declaration under Section 564(b)(1) of the Act, 21 U.S.C. section 360bbb-3(b)(1), unless the authorization is terminated or revoked sooner.  Performed at [facility], [address] [place], [place] [address]   MRSA PCR Screening     Status: None   Collection Time: 03/01/20  6:36 PM   Specimen: Nasopharyngeal  Result Value Ref Range Status   MRSA by PCR NEGATIVE NEGATIVE Final    Comment:        The GeneXpert MRSA Assay (FDA approved for NASAL specimens only), is one component of a comprehensive MRSA colonization surveillance program. It is not intended to diagnose MRSA infection nor to guide or monitor treatment for MRSA infections. Performed at [facility], [address] [place], [place] [address]      Studies: [facility] Chest [place] 1 View  Result Date: 03/03/2020 CLINICAL DATA:  History of congestive heart failure and mitral regurgitation EXAM: PORTABLE CHEST 1 VIEW COMPARISON:  March 01, 2020 FINDINGS: There is cardiomegaly with pulmonary venous hypertension. There is a slight degree of interstitial edema. There is no airspace opacity. No adenopathy. No bone lesions. IMPRESSION: Cardiomegaly with pulmonary vascular congestion. Mild interstitial edema. Suspect a degree of underlying congestive heart failure. No consolidation.  Electronically Signed   By: Bretta Bang III M.D.   On: 03/03/2020 12:19    Scheduled Meds: . albuterol  2.5 mg Inhalation Q6H  . amiodarone  400 mg Oral BID  . aspirin EC  81 mg Oral Daily  . cholecalciferol  2,000 Units Oral Daily  . digoxin  0.125 mg Oral Daily  . divalproex  500 mg Oral TID WC  . enoxaparin (LOVENOX) injection  40 mg Subcutaneous Q24H  . famotidine  40 mg Oral QHS  . fenofibrate  160 mg Oral Daily  . furosemide  20 mg Intravenous Once  . furosemide  20 mg Intravenous BID  . lactose free nutrition  237 mL Oral Daily  . levETIRAcetam  1,500 mg Oral QPM   And  . levETIRAcetam  1,000 mg Oral Daily  . loratadine  10 mg Oral Daily  . multivitamin with minerals  1 tablet Oral Daily  . pantoprazole  40 mg Oral Daily  . psyllium  1 packet Oral Daily  . risperiDONE  1 mg Oral Daily  . risperiDONE  2 mg Oral QHS  . sodium chloride flush  3 mL Intravenous Q12H  . vitamin B-12  1,000 mcg Oral Daily  . [START ON 03/04/2020] Vitamin D (Ergocalciferol)  50,000 Units Oral Weekly   Continuous Infusions: . sodium chloride      Assessment/Plan:  1. Rapid atrial fibrillation and hypotension.  Digoxin need to be given last night and patient also on amiodarone.  Limited with medication secondary to hypotension. 2. Acute on chronic diastolic congestive heart failure with mitral regurgitation.  Needed to start IV Lasix with rattling in the chest today and more sounds in the lungs.  Patient also placed on oxygen. 3. Seizure disorder on Keppra and valproic acid 4. Weakness.  Patient will have to walk in order to go back to the group home. 5. Hypertriglyceridemia on fenofibrate 6. GERD on Pepcid and Protonix 7. We will get speech therapy evaluation    Code Status:     Code Status Orders  (From admission, onward)         Start     Ordered   03/01/20 0739  Full code  Continuous        03/01/20 0739        Code Status History    This patient has a current code  status but no historical code status.   Advance Care Planning Activity     Family Communication: Spoke with group home owner [name] Disposition Plan: Status is: Inpatient  Dispo: The patient is from: Group home              Anticipated d/c is to: Group home versus rehab depending on clinical course              Anticipated d/c date is: Will be pushed back at this point since now has congestive heart failure again and heart rate fast last night.              Patient currently not medically stable for disposition at this point.  Patient needs better heart rate control and CHF needs to be controlled.  Consultants:  Cardiology  Time spent: 28 minutes  Juanette Urizar [employer]

## 2020-03-03 NOTE — Progress Notes (Signed)
Patient Name: Madalyn RobCharles T Errico Date of Encounter: 03/03/2020  Hospital Problem List     Principal Problem:   Acute on chronic diastolic CHF (congestive heart failure) (HCC) Active Problems:   Hypertriglyceridemia   Weakness   HTN (hypertension)   GERD (gastroesophageal reflux disease)   New onset atrial fibrillation (HCC)   Atrial fibrillation with RVR (HCC)   Seizure (HCC)   Acute on chronic diastolic (congestive) heart failure (HCC)   Hypotension   Mitral valve insufficiency    Patient Profile     59 y.o.malewith history ofseizure disorder, hyperlipidemia, OCD with a history of severe MR admitted with seizure. Given IV fluids and developed crackles consistent with congestive heart failure. [facility] with echocardiogram done in 2019 read as showingEF 55 to 60% with mitral valve prolapse and severe MR. Left atrium is severely dilated. More recent echocardiogram per Dr. Juliann Paresallwood in February 2020 showing an EF of 40% with severe left atrial enlargement severe MR. Was found to have global hypokinesis. Last seen in March of this year and apparently was doing okay. He was treated by neurology with Keppra 1000 twice daily as well as Depakote 500 mg 3 times daily with plans to possibly discontinue Depakote and start on Vimpat. Presented to the ER yesterday with a seizure. Patient is nonverbal. Was given IV fluids 2 L over 9 hours developed crackles. BNP was 550.1, 1.08, with a valproate level of 134. Chest x-ray showed cardiomegaly with pulmonary venous congestion. EKG showed atrial fibrillation with rapid ventricular response. Echocardiogram from this visit is pending. Patient is currently on carvedilol 3.125 mg twice daily, enteric-coated aspirin, carbamazepine, divalproex, losartan 25 mg daily, Risperdal.  Subjective   noncomunnicative  Inpatient Medications    . albuterol  2.5 mg Inhalation Q6H  . amiodarone  400 mg Oral BID  . aspirin EC  81 mg Oral  Daily  . cholecalciferol  2,000 Units Oral Daily  . digoxin  0.125 mg Oral Daily  . divalproex  500 mg Oral TID WC  . enoxaparin (LOVENOX) injection  40 mg Subcutaneous Q24H  . famotidine  40 mg Oral QHS  . fenofibrate  160 mg Oral Daily  . furosemide  20 mg Intravenous BID  . lactose free nutrition  237 mL Oral Daily  . levETIRAcetam  1,500 mg Oral QPM   And  . levETIRAcetam  1,000 mg Oral Daily  . loratadine  10 mg Oral Daily  . multivitamin with minerals  1 tablet Oral Daily  . pantoprazole  40 mg Oral Daily  . psyllium  1 packet Oral Daily  . risperiDONE  1 mg Oral Daily  . risperiDONE  2 mg Oral QHS  . sodium chloride flush  3 mL Intravenous Q12H  . vitamin B-12  1,000 mcg Oral Daily  . [START ON 03/04/2020] Vitamin D (Ergocalciferol)  50,000 Units Oral Weekly    Vital Signs    Vitals:   03/03/20 0720 03/03/20 1100 03/03/20 1215 03/03/20 1354  BP: 98/74  (!) 89/62 107/71  Pulse: 92  (!) 103 (!) 116  Resp: 14  14 20   Temp: 98.3 F (36.8 C)  98.2 F (36.8 C) 98.2 F (36.8 C)  TempSrc: Oral  Oral Oral  SpO2: 99% 100% 96% 95%  Weight:      Height:        Intake/Output Summary (Last 24 hours) at 03/03/2020 1434 Last data filed at 03/03/2020 0700 Gross per 24 hour  Intake 110.87 ml  Output 800 ml  Net -689.13 ml   Filed Weights   03/01/20 1828 03/02/20 0330 03/03/20 0401  Weight: 59.5 kg 60.6 kg 61 kg    Physical Exam    GEN: Well nourished, well developed, in no acute distress.  HEENT: normal.  Neck: Supple, no JVD, carotid bruits, or masses. Cardiac:irr, irr Respiratory:  Respirations regular and unlabored, clear to auscultation bilaterally. GI: Soft, nontender, nondistended, BS + x 4. MS: no deformity or atrophy. Skin: warm and dry, no rash. Neuro:  noncommunicative  Labs    CBC Recent Labs    02/29/20 2218 03/02/20 0617  WBC 6.4 6.1  HGB 10.9* 10.3*  HCT 32.1* 31.1*  MCV 93.9 96.6  PLT 160 141*   Basic Metabolic Panel Recent Labs     03/02/20 0617 03/03/20 0729  NA 137 138  K 4.2 4.7  CL 105 108  CO2 22 21*  GLUCOSE 121* 102*  BUN 18 18  CREATININE 1.13 1.29*  CALCIUM 8.6* 8.8*  MG 2.1  --    Liver Function Tests Recent Labs    02/29/20 2218  AST 21  ALT 13  ALKPHOS 21*  BILITOT 0.7  PROT 6.2*  ALBUMIN 3.7   No results for input(s): LIPASE, AMYLASE in the last 72 hours. Cardiac Enzymes No results for input(s): CKTOTAL, CKMB, CKMBINDEX, TROPONINI in the last 72 hours. BNP Recent Labs    03/01/20 0622  BNP 550.7*   D-Dimer No results for input(s): DDIMER in the last 72 hours. Hemoglobin A1C No results for input(s): HGBA1C in the last 72 hours. Fasting Lipid Panel No results for input(s): CHOL, HDL, LDLCALC, TRIG, CHOLHDL, LDLDIRECT in the last 72 hours. Thyroid Function Tests Recent Labs    03/02/20 0617  TSH 2.268    Telemetry    afib with variable vr  ECG    afib with rvr  Radiology    DG Chest 2 View  Result Date: 03/01/2020 CLINICAL DATA:  Cough.  Tachycardia. EXAM: CHEST - 2 VIEW COMPARISON:  CT abdomen 10/07/2019.  Chest x-ray 07/22/2016. FINDINGS: Cardiomegaly with pulmonary venous congestion and bilateral interstitial prominence most consistent interstitial edema. Pneumonitis cannot be excluded. No pleural effusion or pneumothorax. Pulmonary nodule identified on prior CT of 10/07/2019 best identified by prior CT. No acute bony abnormality. IMPRESSION: Cardiomegaly with pulmonary venous congestion and bilateral interstitial prominence most consistent with congestive heart failure with interstitial edema. No pleural effusion. Electronically Signed   By: Maisie Fus  Register   On: 03/01/2020 06:04   DG Chest Port 1 View  Result Date: 03/03/2020 CLINICAL DATA:  History of congestive heart failure and mitral regurgitation EXAM: PORTABLE CHEST 1 VIEW COMPARISON:  March 01, 2020 FINDINGS: There is cardiomegaly with pulmonary venous hypertension. There is a slight degree of interstitial  edema. There is no airspace opacity. No adenopathy. No bone lesions. IMPRESSION: Cardiomegaly with pulmonary vascular congestion. Mild interstitial edema. Suspect a degree of underlying congestive heart failure. No consolidation. Electronically Signed   By: Bretta Bang III M.D.   On: 03/03/2020 12:19   ECHOCARDIOGRAM COMPLETE  Result Date: 03/01/2020    ECHOCARDIOGRAM REPORT   Patient Name:   REYAAN THOMA Date of Exam: 03/01/2020 Medical Rec #:  [id number]        Height:       68.0 in Accession #:    [id number]       Weight:       140.4 lb Date of Birth:  1960/12/29  BSA:          1.759 m Patient Age:    58 years         BP:           103/79 mmHg Patient Gender: M                HR:           114 bpm. Exam Location:  [facility] Procedure: 2D Echo, Color Doppler and Cardiac Doppler Indications:     CHF- acute diastolic 428.31  History:         Patient has prior history of Echocardiogram examinations, most                  recent 10/29/2017. Signs/Symptoms:Murmur. Severe Mitral                  regurgitation.  Sonographer:     Cristela Blue RDCS (AE) Referring Phys:  [id number] Brien Few NIU Diagnosing Phys: Harold Hedge MD IMPRESSIONS  1. Left ventricular ejection fraction, by estimation, is 40 to 45%. Left ventricular ejection fraction by 2D MOD biplane is 34.5 %. The left ventricle has mildly decreased function. The left ventricle demonstrates global hypokinesis. Left ventricular diastolic parameters were normal.  2. Right ventricular systolic function is normal. The right ventricular size is normal. There is normal pulmonary artery systolic pressure.  3. Left atrial size was moderately dilated.  4. Right atrial size was mildly dilated.  5. The mitral valve is abnormal. Moderate to severe mitral valve regurgitation.  6. The aortic valve was not well visualized. Aortic valve regurgitation is trivial. FINDINGS  Left Ventricle: Left ventricular ejection fraction, by estimation, is 40 to 45%. Left ventricular ejection  fraction by 2D MOD biplane is 34.5 %. The left ventricle has mildly decreased function. The left ventricle demonstrates global hypokinesis. The left ventricular internal cavity size was normal in size. There is borderline left ventricular hypertrophy. Left ventricular diastolic parameters were normal. Right Ventricle: The right ventricular size is normal. No increase in right ventricular wall thickness. Right ventricular systolic function is normal. There is normal pulmonary artery systolic pressure. The tricuspid regurgitant velocity is 2.43 m/s, and  with an assumed right atrial pressure of 10 mmHg, the estimated right ventricular systolic pressure is 33.6 mmHg. Left Atrium: Left atrial size was moderately dilated. Right Atrium: Right atrial size was mildly dilated. Pericardium: There is no evidence of pericardial effusion. Mitral Valve: The mitral valve is abnormal. There is moderate prolapse of the medial scallop of the posterior leaflet of the mitral valve. Moderate to severe mitral valve regurgitation. MV peak gradient, 8.8 mmHg. The mean mitral valve gradient is 4.0 mmHg. Tricuspid Valve: The tricuspid valve is grossly normal. Tricuspid valve regurgitation is trivial. Aortic Valve: The aortic valve was not well visualized. Aortic valve regurgitation is trivial. Aortic valve mean gradient measures 15.7 mmHg. Aortic valve peak gradient measures 24.5 mmHg. Aortic valve area, by VTI measures 0.73 cm. Pulmonic Valve: The pulmonic valve was not well visualized. Pulmonic valve regurgitation is trivial. Aorta: The aortic root is normal in size and structure. IAS/Shunts: The interatrial septum was not assessed.  LEFT VENTRICLE PLAX 2D                        Biplane EF (MOD) LVIDd:         6.07 cm         LV Biplane EF:   Left LVIDs:  5.56 cm                          ventricular LV PW:         1.66 cm                          ejection LV IVS:        0.96 cm                          fraction by LVOT diam:      2.30 cm                          2D MOD LV SV:         35                               biplane is LV SV Index:   20                               34.5 %. LVOT Area:     4.15 cm                                Diastology                                LV e' lateral:   12.20 cm/s LV Volumes (MOD)               LV E/e' lateral: 8.4 LV vol d, MOD    133.0 ml      LV e' medial:    11.10 cm/s A2C:                           LV E/e' medial:  9.2 LV vol d, MOD    169.0 ml A4C: LV vol s, MOD    93.1 ml A2C: LV vol s, MOD    99.5 ml A4C: LV SV MOD A2C:   39.9 ml LV SV MOD A4C:   169.0 ml LV SV MOD BP:    53.6 ml RIGHT VENTRICLE RV S prime:     11.60 cm/s TAPSE (M-mode): 2.4 cm LEFT ATRIUM            Index       RIGHT ATRIUM           Index LA diam:      5.60 cm  3.18 cm/m  RA Area:     12.20 cm LA Vol (A2C): 98.9 ml  56.24 ml/m RA Volume:   26.50 ml  15.07 ml/m LA Vol (A4C): 173.0 ml 98.37 ml/m  AORTIC VALVE                    PULMONIC VALVE AV Area (Vmax):    0.73 cm     RVOT Peak grad: 3 mmHg AV Area (Vmean):   0.58 cm AV Area (VTI):     0.73 cm AV Vmax:           247.67 cm/s AV Vmean:  179.000 cm/s AV VTI:            0.485 m AV Peak Grad:      24.5 mmHg AV Mean Grad:      15.7 mmHg LVOT Vmax:         43.60 cm/s LVOT Vmean:        25.000 cm/s LVOT VTI:          0.085 m LVOT/AV VTI ratio: 0.17  AORTA Ao Root diam: 2.90 cm MITRAL VALVE                TRICUSPID VALVE MV Area (PHT): 4.36 cm     TR Peak grad:   23.6 mmHg MV Peak grad:  8.8 mmHg     TR Vmax:        243.00 cm/s MV Mean grad:  4.0 mmHg MV Vmax:       1.48 m/s     SHUNTS MV Vmean:      82.1 cm/s    Systemic VTI:  0.08 m MV Decel Time: 174 msec     Systemic Diam: 2.30 cm MV E velocity: 102.00 cm/s MV A velocity: 37.80 cm/s MV E/A ratio:  2.70 Harold Hedge MD Electronically signed by Harold Hedge MD Signature Date/Time: 03/01/2020/12:35:00 PM    Final     Assessment & Plan    59 y.o.malewith history ofseizure disorder, hyperlipidemia, OCD with a  history of severe MR admitted with seizure. Given IV fluids and developed crackles consistent with congestive heart failure. Was followed by [facility] with echocardiogram done in 2019 read as showingEF 55 to 60% with mitral valve prolapse and severe MR. Left atrium is severely dilated. More recent echocardiogram per Dr. Juliann Pares in February 2020 showing an EF of 40% with severe left atrial enlargement severe MR. Was found to have global hypokinesis. Last seen in March of this year and apparently was doing okay. He was treated by neurology with Keppra 1000 twice daily as well as Depakote 500 mg 3 times daily with plans to possibly discontinue Depakote and start on Vimpat. Presented to the ER yesterday with a seizure. Patient is nonverbal. Was given IV fluids 2 L over 9 hours developed crackles. BNP was 550.1, 1.08, with a valproate level of 134. Chest x-ray showed cardiomegaly with pulmonary venous congestion. EKG showed atrial fibrillation with rapid ventricular response. Echocardiogram from this visit is pending. Patient is currently on carvedilol 3.125 mg twice daily, enteric-coated aspirin, carbamazepine, divalproex, losartan 25 mg daily, Risperdal.  Atrial fibrillation-has had variable ventricular response.  Is currently on amiodarone oral load at 400 mg twice daily.  Did not tolerate IV due to hypotension.  Has intermittent episodes of rapid ventricular response.  Very soft blood pressure which is limited intervention.  Has received IV digoxin on 2 occasions with some benefit.  Not a candidate for anticoagulation.  We will continue with p.o. amiodarone and attempt to control as we can.  Had a 4 beat run of wide-complex tachycardia aberrant SVT versus VT.  Patient was asymptomatic.  Mitral valve disease-severe MR.  Not a candidate for surgical intervention.  CHF-likely secondary to the aforementioned problems mitral valve disease as well as A. fib.  Will carefully diurese following renal  function, hemodynamics, and symptoms.  Signed, Darlin Priestly Perline Awe MD 03/03/2020, 2:34 PM  Pager: [phone]

## 2020-03-03 NOTE — Progress Notes (Signed)
Pt had 4 beat run of v-tach, pt asymptomatic, vss, Dr. Lady Gary notified, no new orders received, will continue to monitor.

## 2020-03-03 NOTE — Evaluation (Signed)
Physical Therapy Evaluation Patient Details Name: Raymond [name] MRN: [id number] DOB: [birth year] Today's Date: 03/03/2020   History of Present Illness  Patient is a 59 year old male brought in by EMS after he had a brief seizure was noted to be unresponsive. Rapid atrial fibrillation and hypotension requiring amiodarone, seizure, acute on chronic diastolic congestive heart failure with mitral regurgitation, Hypertriglyceridemia, GERD, weakness. History of mental retardation, epilepsy, obsessive compulsive disorder, and lives at a group home.     Clinical Impression  PT evaluation completed. Patient lethargic initially but arouses with verbal stimulation and is able to follow single step commands consistently with extra time. Patient needs Min A for bed mobility, Mod A for sit to stand transfers. Min A required to maintain standing balance with standing tolerance of less than 2 minutes. Patient is able to participate with LE therapeutic exercises while seated on edge of bed and in supine position. Orthostatic vitals are as follows: supine 89/64 mmHg, pulse 90, Sp02 100%; sitting: 101/81 mmHg, pulse 117, Sp02 100%; standing: 106/81 mmHg, pulse 123, Sp02 100%. No dizziness reported with activity. Recommend PT to address functional limitations listed below to maximize function and return to PLOF. Patient will need assistance for mobility at discharge.     Follow Up Recommendations Supervision/Assistance - 24 hour (anticipate d/c back to facility, recommend PT at discharge )    Equipment Recommendations   (to be determined at next level of care )    Recommendations for Other Services       Precautions / Restrictions Precautions Precautions: Fall Restrictions Weight Bearing Restrictions: No      Mobility  Bed Mobility Overal bed mobility: Needs Assistance Bed Mobility: Supine to Sit;Sit to Supine     Supine to sit: Min assist Sit to supine: Min assist   General bed mobility comments:  assistance for trunk support. verbal cues for sequending and task initiation   Transfers Overall transfer level: Needs assistance Equipment used: None Transfers: Sit to/from Stand;Lateral/Scoot Transfers Sit to Stand: Mod assist        Lateral/Scoot Transfers: Min assist General transfer comment: with first attempt at sit to stand, patient unable to complete due to delayed motor planning. with cues for technique, patient is able to stand with Mod A for lifting and lowering assistance. Min A required for scooting along edge of bed x 4 bouts towards head of bed   Ambulation/Gait             General Gait Details: not assessed this session   Stairs            Wheelchair Mobility    Modified Rankin (Stroke Patients Only)       Balance Overall balance assessment: Needs assistance Sitting-balance support: Feet supported;Bilateral upper extremity supported Sitting balance-Leahy Scale: Fair Sitting balance - Comments: intermittent cues required for midline as patient with tendency to have flexed posture in sitting  Postural control:  (forward lean ) Standing balance support: During functional activity Standing balance-Leahy Scale: Poor Standing balance comment: patient required Min A to maintain standing balance                              Pertinent Vitals/Pain Pain Assessment: No/denies pain    Home Living Family/patient expects to be discharged to:: Group home                      Prior Function Level of Independence:  Needs assistance         Comments: per report, patient needs assistance with mobility at group home. patient states that he walks "sometimes". patient is a poor historian      Hand Dominance        Extremity/Trunk Assessment   Upper Extremity Assessment Upper Extremity Assessment: Generalized weakness    Lower Extremity Assessment Lower Extremity Assessment: Generalized weakness;Difficult to assess due to impaired  cognition (unable to formall MMT due to cognition )       Communication   Communication: Other (comment) (minimal verbalizations but is able to communicate )  Cognition Arousal/Alertness: Lethargic Behavior During Therapy: Flat affect Overall Cognitive Status: History of cognitive impairments - at baseline                                 General Comments: patient has history of mental retardation. patient is able to follow single step commands consistently       General Comments      Exercises General Exercises - Lower Extremity Ankle Circles/Pumps: AAROM;Strengthening;Both;10 reps;Supine Short Arc Quad: AAROM;Strengthening;Both;10 reps;Supine Long Arc Quad: AAROM;Strengthening;Both;10 reps;Seated Hip ABduction/ADduction: AAROM;Strengthening;Both;10 reps;Supine Other Exercises Other Exercises: verbal and visual cues for technique for exercise    Assessment/Plan    PT Assessment Patient needs continued PT services  PT Problem List Decreased strength;Decreased activity tolerance;Decreased balance;Decreased mobility;Decreased cognition;Decreased safety awareness;Decreased knowledge of precautions       PT Treatment Interventions DME instruction;Gait training;Stair training;Functional mobility training;Therapeutic activities;Therapeutic exercise;Balance training;Patient/family education    PT Goals (Current goals can be found in the Care Plan section)  Acute Rehab PT Goals Patient Stated Goal: patient unable to participate with goal setting  PT Goal Formulation: Patient unable to participate in goal setting Time For Goal Achievement: 03/17/20 Potential to Achieve Goals: Fair    Frequency Min 2X/week   Barriers to discharge        Co-evaluation               AM-PAC PT "6 Clicks" Mobility  Outcome Measure Help needed turning from your back to your side while in a flat bed without using bedrails?: A Little Help needed moving from lying on your back to  sitting on the side of a flat bed without using bedrails?: A Little Help needed moving to and from a bed to a chair (including a wheelchair)?: A Lot Help needed standing up from a chair using your arms (e.g., wheelchair or bedside chair)?: A Lot Help needed to walk in hospital room?: Total Help needed climbing 3-5 steps with a railing? : Total 6 Click Score: 12    End of Session Equipment Utilized During Treatment: Gait belt;Oxygen Activity Tolerance: Patient tolerated treatment well Patient left: in bed;with call bell/phone within reach;with bed alarm set Nurse Communication: Mobility status PT Visit Diagnosis: Muscle weakness (generalized) (M62.81);Unsteadiness on feet (R26.81)    Time: 1027-2536 PT Time Calculation (min) (ACUTE ONLY): 23 min   Charges:   PT Evaluation $PT Eval Moderate Complexity: 1 Mod PT Treatments $Therapeutic Exercise: 8-22 mins        Donna Bernard, PT, MPT  Ina [employer] 03/03/2020, 11:20 AM

## 2020-03-03 NOTE — Progress Notes (Signed)
   03/02/20 2000  Assess: MEWS Score  BP 100/75  Pulse Rate (!) 134  ECG Heart Rate (!) 142  Resp 20  Assess: MEWS Score  MEWS Temp 0  MEWS Systolic 1  MEWS Pulse 3  MEWS RR 0  MEWS LOC 0  MEWS Score 4  MEWS Score Color Red  Assess: if the MEWS score is Yellow or Red  Were vital signs taken at a resting state? Yes  Focused Assessment Change from prior assessment (see assessment flowsheet)  Early Detection of Sepsis Score *See Row Information* Low  MEWS guidelines implemented *See Row Information* Yes  Treat  MEWS Interventions Escalated (See documentation below)  Take Vital Signs  Increase Vital Sign Frequency  Red: Q 1hr X 4 then Q 4hr X 4, if remains red, continue Q 4hrs  Escalate  MEWS: Escalate Red: discuss with charge nurse/RN and provider, consider discussing with RRT  Notify: Provider  Provider Name/Title Fath MD  Date Provider Notified 03/02/20  Time Provider Notified 1955  Notification Type Page  Notification Reason Change in status  Response See new orders  Date of Provider Response 03/02/20  Time of Provider Response 1955  Document  Patient Outcome Stabilized after interventions  Progress note created (see row info) Yes

## 2020-03-03 NOTE — Progress Notes (Signed)
PT BEING MOVED UP IN BED AND CHANGED BY TRACY B RN AND I

## 2020-03-04 ENCOUNTER — Encounter: Payer: Self-pay | Admitting: Internal Medicine

## 2020-03-04 DIAGNOSIS — Z7189 Other specified counseling: Secondary | ICD-10-CM

## 2020-03-04 DIAGNOSIS — Z515 Encounter for palliative care: Secondary | ICD-10-CM

## 2020-03-04 LAB — BASIC METABOLIC PANEL
Anion gap: 13 (ref 5–15)
BUN: 25 mg/dL — ABNORMAL HIGH (ref 6–20)
CO2: 24 mmol/L (ref 22–32)
Calcium: 9 mg/dL (ref 8.9–10.3)
Chloride: 103 mmol/L (ref 98–111)
Creatinine, Ser: 1.32 mg/dL — ABNORMAL HIGH (ref 0.61–1.24)
GFR calc Af Amer: >60 mL/min (ref >60)
GFR calc non Af Amer: 59 mL/min — ABNORMAL LOW (ref >60)
Glucose, Bld: 108 mg/dL — ABNORMAL HIGH (ref 70–99)
Potassium: 4.1 mmol/L (ref 3.5–5.1)
Sodium: 140 mmol/L (ref 135–145)

## 2020-03-04 LAB — DIGOXIN LEVEL: Digoxin Level: 0.6 ng/mL — ABNORMAL LOW (ref 0.8–2.0)

## 2020-03-04 LAB — VALPROIC ACID LEVEL: Valproic Acid Lvl: 83 ug/mL (ref 50.0–100.0)

## 2020-03-04 MED ORDER — FUROSEMIDE 20 MG PO TABS
20.0000 mg | ORAL_TABLET | Freq: Two times a day (BID) | ORAL | Status: DC
  Administered 2020-03-04: 20 mg via ORAL
  Filled 2020-03-04: qty 1

## 2020-03-04 MED ORDER — MIDODRINE HCL 5 MG PO TABS
2.5000 mg | ORAL_TABLET | Freq: Three times a day (TID) | ORAL | Status: DC
  Administered 2020-03-04 – 2020-03-07 (×11): 2.5 mg via ORAL
  Filled 2020-03-04 (×10): qty 1

## 2020-03-04 MED ORDER — ALBUTEROL SULFATE (2.5 MG/3ML) 0.083% IN NEBU
2.5000 mg | INHALATION_SOLUTION | Freq: Three times a day (TID) | RESPIRATORY_TRACT | Status: DC
  Administered 2020-03-04 – 2020-03-05 (×4): 2.5 mg via RESPIRATORY_TRACT
  Filled 2020-03-04 (×4): qty 3

## 2020-03-04 MED ORDER — DIGOXIN 125 MCG PO TABS
0.0625 mg | ORAL_TABLET | Freq: Every day | ORAL | Status: DC
  Administered 2020-03-05 – 2020-03-10 (×6): 0.0625 mg via ORAL
  Filled 2020-03-04 (×6): qty 0.5

## 2020-03-04 MED ORDER — ENSURE ENLIVE PO LIQD
237.0000 mL | Freq: Three times a day (TID) | ORAL | Status: DC
  Administered 2020-03-04 – 2020-03-09 (×11): 237 mL via ORAL

## 2020-03-04 NOTE — Progress Notes (Signed)
Caregiver Langston Reusing phone # [phone] updated on pt status she is the caregiver at [facility] group home, per caregiver patient has legal guardian is [employer]. The social worker who is on his case is Britt Bottom [phone].

## 2020-03-04 NOTE — Progress Notes (Signed)
Physical Therapy Treatment Patient Details Name: Raymond [name] MRN: [id number] DOB: [birth year] Today's Date: 03/04/2020    History of Present Illness Patient is a 59 year old male brought in by EMS after he had a brief seizure was noted to be unresponsive. Rapid atrial fibrillation and hypotension requiring amiodarone, seizure, acute on chronic diastolic congestive heart failure with mitral regurgitation, Hypertriglyceridemia, GERD, weakness. History of mental retardation, epilepsy, obsessive compulsive disorder, and lives at a group home.     PT Comments    Pt in supine position upon arrival to room.  Pt was able to follow commands with for therapeutic exercises in bed.  Pt given extra time and needed assistance from therapist to complete full ROM, however pt was able to assist and perform most activities with 50-75% of full ROM and musculature contraction.  Pt then proceeded to transfer from supine to sit and needed minA for hand held assist and verbal cuing.  Pt performed sit <> stand x2 with good form and modA.  Pt utilized walker and was able to ambulate 14 feet to the sink counter and back to bed.  Pt exhibited increase lateral sway while ambulating which could be corrected by using tactile feedback from therapist.  Pt then transferred back to bed and placed in supine with all needs within reach.  Pt will continue to benefit from skilled therapy at this time, and recommended d/c back to facility with supervision/assistance still appropriate at this time.    Follow Up Recommendations  Supervision/Assistance - 24 hour (anticipate d/c back to facility, recommend PT at discharge )     Equipment Recommendations  Rolling walker with 5" wheels (to be determined at next level of care )    Recommendations for Other Services       Precautions / Restrictions Precautions Precautions: Fall Restrictions Weight Bearing Restrictions: No    Mobility  Bed Mobility Overal bed mobility: Needs  Assistance Bed Mobility: Supine to Sit;Sit to Supine     Supine to sit: Min assist Sit to supine: Min assist   General bed mobility comments: assistance for trunk support. verbal cues for sequencing and task initiation  Transfers Overall transfer level: Needs assistance Equipment used: Rolling walker (2 wheeled) Transfers: Sit to/from Stand Sit to Stand: Mod assist         General transfer comment: Pt able to stand from EOB with use of the FWW and UE support.  PT performed STS twice with good carryover from verbal cues.  Ambulation/Gait Ambulation/Gait assistance: Mod assist Gait Distance (Feet): 14 Feet Assistive device: Rolling walker (2 wheeled) Gait Pattern/deviations: Step-through pattern;Decreased step length - right;Decreased step length - left;Decreased stride length;Staggering left;Staggering right;Narrow base of support Gait velocity: decreased   General Gait Details: Pt able to ambulate with FWW however has difficulty with retaining balance without support from therapist and nurse.   Stairs             Wheelchair Mobility    Modified Rankin (Stroke Patients Only)       Balance Overall balance assessment: Needs assistance Sitting-balance support: Feet supported;Bilateral upper extremity supported Sitting balance-Leahy Scale: Fair Sitting balance - Comments: Pt has flexed posture in seated and has difficulty remaining upright, typically with posterior lean. Postural control: Posterior lean Standing balance support: Bilateral upper extremity supported Standing balance-Leahy Scale: Poor Standing balance comment: Pt requires BUE support on FWW to remain upright and to ambulated tot he counter and back.  Pt has difficulty remaining upright and needs tactile  feedback from therapist to prevent fall.                            Cognition Arousal/Alertness: Lethargic Behavior During Therapy: Flat affect Overall Cognitive Status: History of  cognitive impairments - at baseline                                 General Comments: patient has history of mental retardation. patient is able to follow single step commands consistently       Exercises General Exercises - Lower Extremity Ankle Circles/Pumps: AAROM;Strengthening;Both;10 reps;Supine Long Arc Quad: AAROM;Strengthening;Both;10 reps;Seated Hip ABduction/ADduction: AAROM;Strengthening;Both;10 reps;Supine Straight Leg Raises: AAROM;Strengthening;Both;10 reps;Supine    General Comments        Pertinent Vitals/Pain Pain Assessment: No/denies pain    Home Living                      Prior Function            PT Goals (current goals can now be found in the care plan section) Acute Rehab PT Goals Patient Stated Goal: Patient unable to participate with goal setting PT Goal Formulation: Patient unable to participate in goal setting Time For Goal Achievement: 03/17/20 Potential to Achieve Goals: Fair    Frequency    Min 2X/week      PT Plan      Co-evaluation              AM-PAC PT "6 Clicks" Mobility   Outcome Measure  Help needed turning from your back to your side while in a flat bed without using bedrails?: A Little Help needed moving from lying on your back to sitting on the side of a flat bed without using bedrails?: A Little Help needed moving to and from a bed to a chair (including a wheelchair)?: A Lot Help needed standing up from a chair using your arms (e.g., wheelchair or bedside chair)?: A Lot Help needed to walk in hospital room?: A Lot Help needed climbing 3-5 steps with a railing? : Total 6 Click Score: 13    End of Session Equipment Utilized During Treatment: Gait belt;Oxygen Activity Tolerance: Patient tolerated treatment well Patient left: in bed;with call bell/phone within reach;with bed alarm set;with nursing/sitter in room Nurse Communication: Mobility status PT Visit Diagnosis: Muscle weakness  (generalized) (M62.81);Unsteadiness on feet (R26.81)     Time: 3329-5188 PT Time Calculation (min) (ACUTE ONLY): 26 min  Charges:  $Gait Training: 8-22 mins $Therapeutic Exercise: 8-22 mins                     Nolon Bussing, PT, DPT 03/04/20, 1:33 PM

## 2020-03-04 NOTE — Plan of Care (Signed)
  Problem: Education: Goal: Knowledge of General Education information will improve Description Including pain rating scale, medication(s)/side effects and non-pharmacologic comfort measures Outcome: Progressing   
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
## 2020-03-04 NOTE — Progress Notes (Signed)
Patient Name: Raymond [name] Date of Encounter: 03/04/2020  Hospital Problem List     Principal Problem:   Acute on chronic diastolic CHF (congestive heart failure) (HCC) Active Problems:   Hypertriglyceridemia   Weakness   HTN (hypertension)   GERD (gastroesophageal reflux disease)   New onset atrial fibrillation (HCC)   Atrial fibrillation with RVR (HCC)   Seizure (HCC)   Acute on chronic diastolic (congestive) heart failure (HCC)   Hypotension   Mitral valve insufficiency    Patient Profile     59 y.o.malewith history ofseizure disorder, hyperlipidemia, OCD with a history of severe MR admitted with seizure. Given IV fluids and developed crackles consistent with congestive heart failure. Was followed by [facility] with echocardiogram done in 2019 read as showingEF 55 to 60% with mitral valve prolapse and severe MR. Left atrium is severely dilated. More recent echocardiogram per Dr. Juliann Pares in February 2020 showing an EF of 40% with severe left atrial enlargement severe MR. Was found to have global hypokinesis. Last seen in March of this year and apparently was doing okay. He was treated by neurology with Keppra 1000 twice daily as well as Depakote 500 mg 3 times daily with plans to possibly discontinue Depakote and start on Vimpat. Presented to the ER yesterday with a seizure. Patient is nonverbal. Was given IV fluids 2 L over 9 hours developed crackles. BNP was 550.1, 1.08, with a valproate level of 134. Chest x-ray showed cardiomegaly with pulmonary venous congestion. EKG showed atrial fibrillation with rapid ventricular response. Echocardiogram from this visit is pending. Patient is currently on carvedilol 3.125 mg twice daily, enteric-coated aspirin, carbamazepine, divalproex, losartan 25 mg daily, Risperdal.  Subjective   Nonverbal  Inpatient Medications     albuterol  2.5 mg Inhalation Q6H   amiodarone  400 mg Oral BID   aspirin EC  81 mg Oral Daily    cholecalciferol  2,000 Units Oral Daily   digoxin  0.125 mg Oral Daily   divalproex  500 mg Oral TID WC   enoxaparin (LOVENOX) injection  40 mg Subcutaneous Q24H   famotidine  40 mg Oral QHS   fenofibrate  160 mg Oral Daily   furosemide  20 mg Oral BID   lactose free nutrition  237 mL Oral Daily   levETIRAcetam  1,500 mg Oral QPM   And   levETIRAcetam  1,000 mg Oral Daily   loratadine  10 mg Oral Daily   midodrine  2.5 mg Oral TID WC   multivitamin with minerals  1 tablet Oral Daily   pantoprazole  40 mg Oral Daily   psyllium  1 packet Oral Daily   risperiDONE  1 mg Oral Daily   risperiDONE  2 mg Oral QHS   sodium chloride flush  3 mL Intravenous Q12H   vitamin B-12  1,000 mcg Oral Daily   Vitamin D (Ergocalciferol)  50,000 Units Oral Weekly    Vital Signs    Vitals:   03/03/20 2100 03/04/20 0202 03/04/20 0421 03/04/20 0746  BP:   100/79 91/63  Pulse:   100 78  Resp: 16  15 17   Temp:   98.3 F (36.8 C) 98.1 F (36.7 C)  TempSrc:      SpO2:  100% 96% 96%  Weight:   60.8 kg   Height:        Intake/Output Summary (Last 24 hours) at 03/04/2020 0846 Last data filed at 03/03/2020 2151 Gross per 24 hour  Intake 243 ml  Output 625 ml  Net -382 ml   Filed Weights   03/02/20 0330 03/03/20 0401 03/04/20 0421  Weight: 60.6 kg 61 kg 60.8 kg    Physical Exam    GEN: Well nourished, well developed, in no acute distress.  HEENT: normal.  Neck: Supple, no JVD, carotid bruits, or masses. Cardiac: Irregularly irregular Respiratory:  Respirations regular and unlabored, clear to auscultation bilaterally. GI: Soft, nontender, nondistended, BS + x 4. MS: no deformity or atrophy. Skin: warm and dry, no rash. Neuro: Opens eyes to command but nonverbal. Labs    CBC Recent Labs    03/02/20 0617  WBC 6.1  HGB 10.3*  HCT 31.1*  MCV 96.6  PLT 141*   Basic Metabolic Panel Recent Labs    16/04/9607/04/21 0617 03/02/20 0617 03/03/20 0729 03/04/20 0453  NA 137    < > 138 140  K 4.2   < > 4.7 4.1  CL 105   < > 108 103  CO2 22   < > 21* 24  GLUCOSE 121*   < > 102* 108*  BUN 18   < > 18 25*  CREATININE 1.13   < > 1.29* 1.32*  CALCIUM 8.6*   < > 8.8* 9.0  MG 2.1  --   --   --    < > = values in this interval not displayed.   Liver Function Tests No results for input(s): AST, ALT, ALKPHOS, BILITOT, PROT, ALBUMIN in the last 72 hours. No results for input(s): LIPASE, AMYLASE in the last 72 hours. Cardiac Enzymes No results for input(s): CKTOTAL, CKMB, CKMBINDEX, TROPONINI in the last 72 hours. BNP No results for input(s): BNP in the last 72 hours. D-Dimer No results for input(s): DDIMER in the last 72 hours. Hemoglobin A1C No results for input(s): HGBA1C in the last 72 hours. Fasting Lipid Panel No results for input(s): CHOL, HDL, LDLCALC, TRIG, CHOLHDL, LDLDIRECT in the last 72 hours. Thyroid Function Tests Recent Labs    03/02/20 0617  TSH 2.268    Telemetry    Fibrillation with variable ventricular response  ECG    A. fib with RVR  Radiology    DG Chest 2 View  Result Date: 03/01/2020 CLINICAL DATA:  Cough.  Tachycardia. EXAM: CHEST - 2 VIEW COMPARISON:  CT abdomen 10/07/2019.  Chest x-ray 07/22/2016. FINDINGS: Cardiomegaly with pulmonary venous congestion and bilateral interstitial prominence most consistent interstitial edema. Pneumonitis cannot be excluded. No pleural effusion or pneumothorax. Pulmonary nodule identified on prior CT of 10/07/2019 best identified by prior CT. No acute bony abnormality. IMPRESSION: Cardiomegaly with pulmonary venous congestion and bilateral interstitial prominence most consistent with congestive heart failure with interstitial edema. No pleural effusion. Electronically Signed   By: Maisie Fushomas  Register   On: 03/01/2020 06:04   DG Chest Port 1 View  Result Date: 03/03/2020 CLINICAL DATA:  History of congestive heart failure and mitral regurgitation EXAM: PORTABLE CHEST 1 VIEW COMPARISON:  March 01, 2020  FINDINGS: There is cardiomegaly with pulmonary venous hypertension. There is a slight degree of interstitial edema. There is no airspace opacity. No adenopathy. No bone lesions. IMPRESSION: Cardiomegaly with pulmonary vascular congestion. Mild interstitial edema. Suspect a degree of underlying congestive heart failure. No consolidation. Electronically Signed   By: Bretta BangWilliam  Woodruff III M.D.   On: 03/03/2020 12:19   ECHOCARDIOGRAM COMPLETE  Result Date: 03/01/2020    ECHOCARDIOGRAM REPORT   Patient Name:   Madalyn RobCHARLES T Moors Date of Exam: 03/01/2020 Medical Rec #:  [id number]  Height:       68.0 in Accession #:    [id number]       Weight:       140.4 lb Date of Birth:  22-Jul-1961       BSA:          1.759 m Patient Age:    58 years         BP:           103/79 mmHg Patient Gender: M                HR:           114 bpm. Exam Location:  [facility] Procedure: 2D Echo, Color Doppler and Cardiac Doppler Indications:     CHF- acute diastolic 428.31  History:         Patient has prior history of Echocardiogram examinations, most                  recent 10/29/2017. Signs/Symptoms:Murmur. Severe Mitral                  regurgitation.  Sonographer:     Cristela Blue RDCS (AE) Referring Phys:  [id number] Brien Few NIU Diagnosing Phys: Harold Hedge MD IMPRESSIONS  1. Left ventricular ejection fraction, by estimation, is 40 to 45%. Left ventricular ejection fraction by 2D MOD biplane is 34.5 %. The left ventricle has mildly decreased function. The left ventricle demonstrates global hypokinesis. Left ventricular diastolic parameters were normal.  2. Right ventricular systolic function is normal. The right ventricular size is normal. There is normal pulmonary artery systolic pressure.  3. Left atrial size was moderately dilated.  4. Right atrial size was mildly dilated.  5. The mitral valve is abnormal. Moderate to severe mitral valve regurgitation.  6. The aortic valve was not well visualized. Aortic valve regurgitation is trivial. FINDINGS   Left Ventricle: Left ventricular ejection fraction, by estimation, is 40 to 45%. Left ventricular ejection fraction by 2D MOD biplane is 34.5 %. The left ventricle has mildly decreased function. The left ventricle demonstrates global hypokinesis. The left ventricular internal cavity size was normal in size. There is borderline left ventricular hypertrophy. Left ventricular diastolic parameters were normal. Right Ventricle: The right ventricular size is normal. No increase in right ventricular wall thickness. Right ventricular systolic function is normal. There is normal pulmonary artery systolic pressure. The tricuspid regurgitant velocity is 2.43 m/s, and  with an assumed right atrial pressure of 10 mmHg, the estimated right ventricular systolic pressure is 33.6 mmHg. Left Atrium: Left atrial size was moderately dilated. Right Atrium: Right atrial size was mildly dilated. Pericardium: There is no evidence of pericardial effusion. Mitral Valve: The mitral valve is abnormal. There is moderate prolapse of the medial scallop of the posterior leaflet of the mitral valve. Moderate to severe mitral valve regurgitation. MV peak gradient, 8.8 mmHg. The mean mitral valve gradient is 4.0 mmHg. Tricuspid Valve: The tricuspid valve is grossly normal. Tricuspid valve regurgitation is trivial. Aortic Valve: The aortic valve was not well visualized. Aortic valve regurgitation is trivial. Aortic valve mean gradient measures 15.7 mmHg. Aortic valve peak gradient measures 24.5 mmHg. Aortic valve area, by VTI measures 0.73 cm. Pulmonic Valve: The pulmonic valve was not well visualized. Pulmonic valve regurgitation is trivial. Aorta: The aortic root is normal in size and structure. IAS/Shunts: The interatrial septum was not assessed.  LEFT VENTRICLE PLAX 2D  Biplane EF (MOD) LVIDd:         6.07 cm         LV Biplane EF:   Left LVIDs:         5.56 cm                          ventricular LV PW:         1.66 cm                           ejection LV IVS:        0.96 cm                          fraction by LVOT diam:     2.30 cm                          2D MOD LV SV:         35                               biplane is LV SV Index:   20                               34.5 %. LVOT Area:     4.15 cm                                Diastology                                LV e' lateral:   12.20 cm/s LV Volumes (MOD)               LV E/e' lateral: 8.4 LV vol d, MOD    133.0 ml      LV e' medial:    11.10 cm/s A2C:                           LV E/e' medial:  9.2 LV vol d, MOD    169.0 ml A4C: LV vol s, MOD    93.1 ml A2C: LV vol s, MOD    99.5 ml A4C: LV SV MOD A2C:   39.9 ml LV SV MOD A4C:   169.0 ml LV SV MOD BP:    53.6 ml RIGHT VENTRICLE RV S prime:     11.60 cm/s TAPSE (M-mode): 2.4 cm LEFT ATRIUM            Index       RIGHT ATRIUM           Index LA diam:      5.60 cm  3.18 cm/m  RA Area:     12.20 cm LA Vol (A2C): 98.9 ml  56.24 ml/m RA Volume:   26.50 ml  15.07 ml/m LA Vol (A4C): 173.0 ml 98.37 ml/m  AORTIC VALVE                    PULMONIC VALVE AV Area (Vmax):    0.73 cm     RVOT Peak grad: 3 mmHg  AV Area (Vmean):   0.58 cm AV Area (VTI):     0.73 cm AV Vmax:           247.67 cm/s AV Vmean:          179.000 cm/s AV VTI:            0.485 m AV Peak Grad:      24.5 mmHg AV Mean Grad:      15.7 mmHg LVOT Vmax:         43.60 cm/s LVOT Vmean:        25.000 cm/s LVOT VTI:          0.085 m LVOT/AV VTI ratio: 0.17  AORTA Ao Root diam: 2.90 cm MITRAL VALVE                TRICUSPID VALVE MV Area (PHT): 4.36 cm     TR Peak grad:   23.6 mmHg MV Peak grad:  8.8 mmHg     TR Vmax:        243.00 cm/s MV Mean grad:  4.0 mmHg MV Vmax:       1.48 m/s     SHUNTS MV Vmean:      82.1 cm/s    Systemic VTI:  0.08 m MV Decel Time: 174 msec     Systemic Diam: 2.30 cm MV E velocity: 102.00 cm/s MV A velocity: 37.80 cm/s MV E/A ratio:  2.70 Harold Hedge MD Electronically signed by Harold Hedge MD Signature Date/Time: 03/01/2020/12:35:00 PM    Final      Assessment & Plan    59 y.o.malewith history ofseizure disorder, hyperlipidemia, OCD with a history of severe MR admitted with seizure. Given IV fluids and developed crackles consistent with congestive heart failure. Was followed by [facility] with echocardiogram done in 2019 read as showingEF 55 to 60% with mitral valve prolapse and severe MR. Left atrium is severely dilated. More recent echocardiogram per Dr. Juliann Pares in February 2020 showing an EF of 40% with severe left atrial enlargement severe MR. Was found to have global hypokinesis. Last seen in March of this year and apparently was doing okay. He was treated by neurology with Keppra 1000 twice daily as well as Depakote 500 mg 3 times daily with plans to possibly discontinue Depakote and start on Vimpat. Presented to the ER yesterday with a seizure. Patient is nonverbal. Was given IV fluids 2 L over 9 hours developed crackles. BNP was 550.1, 1.08, with a valproate level of 134. Chest x-ray showed cardiomegaly with pulmonary venous congestion. EKG showed atrial fibrillation with rapid ventricular response. Echocardiogram from this visit is pending. Patient is currently on carvedilol 3.125 mg twice daily, enteric-coated aspirin, carbamazepine, divalproex, losartan 25 mg daily, Risperdal.  Atrial fibrillation-has had variable ventricular response.  Is currently on amiodarone  at 400 mg twice daily.  Did not tolerate IV due to hypotension.  Has intermittent episodes of rapid ventricular response.  Very soft blood pressure which is limited intervention.  Has received IV digoxin on 2 occasions with some benefit.  Not a candidate for anticoagulation.  We will continue with p.o. amiodarone and p.o. digoxin and attempt to control rate .  No sustained ventricular rhythm.  Mitral valve disease-severe MR.  Not a candidate for surgical intervention.  CHF-likely secondary to the aforementioned problems mitral valve disease as well as A.  fib.  Will carefully diurese following renal function, hemodynamics, and symptoms.  Has diuresed 1.6 L since admission.  Blood pressure stable  in the upper to lower 90s.  Heart rate is remained 100-100 teens.   Signed, Darlin Priestly Jeran Hiltz MD 03/04/2020, 8:46 AM  Pager: [phone]

## 2020-03-04 NOTE — Evaluation (Signed)
Clinical/Bedside Swallow Evaluation Patient Details  Name: Raymond [name] MRN: [id number] Date of Birth: 08-05-1960  Today's Date: 03/04/2020  Past Medical History:  Past Medical History:  Diagnosis Date  . Epilepsy (HCC)   . Epileptic seizures (HCC)   . Hyperlipidemia   . Mental retardation   . OCD (obsessive compulsive disorder)    water drinking  . OCD (obsessive compulsive disorder)   . Severe mitral regurgitation    a. 02/2016 Echo: EF 55-60%, no rwma, MVP of posterior leaflet w/ an anteriorly directed MR - severe. Sev dil LA; b. TTE 8/18: EF of 55-60%, normal wall motion, normal LV diastolic function parameters, severe mitral prolapse involving the anterior leaflet and posterior leaflet with severe regurgitation directed anteriorly, severely dilated left atrium, unable to estimate the PASP  . Systolic murmur    Past Surgical History:  Past Surgical History:  Procedure Laterality Date  . COLONOSCOPY WITH PROPOFOL N/A 03/23/2019   Procedure: COLONOSCOPY WITH PROPOFOL;  Surgeon: Toledo, Boykin Nearing, MD;  Location: [facility] ENDOSCOPY;  Service: Gastroenterology;  Laterality: N/A;  . ESOPHAGOGASTRODUODENOSCOPY (EGD) WITH PROPOFOL N/A 03/23/2019   Procedure: ESOPHAGOGASTRODUODENOSCOPY (EGD) WITH PROPOFOL;  Surgeon: Toledo, Boykin Nearing, MD;  Location: [facility] ENDOSCOPY;  Service: Gastroenterology;  Laterality: N/A;  NEEDS A RAPID   HPI:  59 y.o. male with history of seizure disorder, hyperlipidemia, OCD with a history of severe MR admitted with seizure.  Given IV fluids and developed crackles consistent with congestive heart failure. Chest x-ray showed cardiomegaly with pulmonary venous congestion. ST consulted to r/o possible aspiration.    Assessment / Plan / Recommendation Clinical Impression  Pt's oropharyngeal abilities appear grossly intact when consuming current diet. during this evaluation, pt's swallow appeared swift with thin liquids via straw and no overt s/s of aspiration.  Additionally, during minimal trials of puree and solids, pt was free of s/s of dysphagia or aspiration. Silent aspiration cannot ruled out at bedside. However per Cardiology's note, pt acute lung crackles appear related to CHF. ST will sign off.  SLP Visit Diagnosis: Dysphagia, unspecified (R13.10)    Aspiration Risk  No limitations    Diet Recommendation  Soft solids with thin liquids via straw  Medication Administration: Whole meds with liquid (may put in applesauce if needed)    Other  Recommendations Oral Care Recommendations: Oral care BID   Follow up Recommendations None        Swallow Study   General Date of Onset: 03/02/20 HPI: 59 y.o. male with history of seizure disorder, hyperlipidemia, OCD with a history of severe MR admitted with seizure.  Given IV fluids and developed crackles consistent with congestive heart failure. Chest x-ray showed cardiomegaly with pulmonary venous congestion. ST consulted to r/o possible aspiration.  Type of Study: Bedside Swallow Evaluation Previous Swallow Assessment: none in chart Diet Prior to this Study: Thin liquids (soft solids) Temperature Spikes Noted: No Respiratory Status: Nasal cannula (2L) History of Recent Intubation: No Behavior/Cognition: Alert;Pleasant mood Oral Cavity Assessment: Within Functional Limits Oral Care Completed by SLP: No Oral Cavity - Dentition: Adequate natural dentition Self-Feeding Abilities: Needs assist Patient Positioning: Upright in bed Baseline Vocal Quality: Low vocal intensity Volitional Cough: Cognitively unable to elicit Volitional Swallow: Unable to elicit    Oral/Motor/Sensory Function Overall Oral Motor/Sensory Function:  (functional for consumption)   Ice Chips Ice chips: Not tested   Thin Liquid Thin Liquid: Within functional limits Presentation: Straw    Nectar Thick Nectar Thick Liquid: Not tested   Honey Thick Honey Thick  Liquid: Not tested   Puree Puree: Within functional  limits Presentation: Spoon   Solid  Raymond An B. Raymond [name] M.S., CCC-SLP, CBIS Speech-Language Pathologist Rehabilitation Services Office [phone]   Solid: Within functional limits     Time: SLP Start Time (ACUTE ONLY): [phone] SLP Stop Time (ACUTE ONLY): 0835 SLP Time Calculation (min) (ACUTE ONLY): 25 min  Raymond [name] 03/04/2020,9:08 AM

## 2020-03-04 NOTE — Progress Notes (Addendum)
Initial Nutrition Assessment  DOCUMENTATION CODES:   Severe malnutrition in context of social or environmental circumstances  INTERVENTION:   Ensure Enlive po TID, each supplement provides 350 kcal and 20 grams of protein  Magic cup TID with meals, each supplement provides 290 kcal and 9 grams of protein  MVI daily   Dysphagia 3 diet   NUTRITION DIAGNOSIS:   Severe Malnutrition related to social / environmental circumstances as evidenced by severe fat depletion, severe muscle depletion  GOAL:   Patient will meet greater than or equal to 90% of their needs  MONITOR:   PO intake, Supplement acceptance, Labs, Weight trends, Skin, I & O's  REASON FOR ASSESSMENT:   Consult Assessment of nutrition requirement/status  ASSESSMENT:   59 y.o. male  with past medical history of HTN/HLD, dCHF, mental retardation with aphasia, OCD (water-drinking), severe mitral regurgitation, epilepsy with seizure disorder, systolic murmur, colonoscopy/EGD August 2020 admitted on 02/29/2020 with acute on chronic diastolic heart failure, new onset A. fib with RVR and possible seizure   Visited pt's room today. Pt is unable to provide any history. Per chart review, pt is eating <50% of meals in hospital. Pt's lunch tray was sitting on his side table untouched. Calorie count ordered by MD and will completed next week. RD suspects pt with poor appetite and oral intake at baseline as pt is malnourished. RD will add supplements and MVI to help pt meet his estimated needs. Pt seen by SLP today who recommended mechanical soft diet. Per chart, pt appears weight stable at baseline. Palliative care following for GOC.   Medications reviewed and include: aspirin, lovenox, pepcid, lasix, protonix, psyllium, B12, Vitamin D  Labs reviewed: BUN 25(H), creat 1.32(H) Hgb 10.3(L), Hct 31.1(L)  NUTRITION - FOCUSED PHYSICAL EXAM:    Most Recent Value  Orbital Region Severe depletion  Upper Arm Region Severe depletion   Thoracic and Lumbar Region Severe depletion  Buccal Region Moderate depletion  Temple Region Severe depletion  Clavicle Bone Region Mild depletion  Clavicle and Acromion Bone Region Mild depletion  Scapular Bone Region Moderate depletion  Dorsal Hand Severe depletion  Patellar Region Severe depletion  Anterior Thigh Region Severe depletion  Posterior Calf Region Severe depletion  Edema (RD Assessment) None  Hair Reviewed  Eyes Reviewed  Mouth Reviewed  Skin Reviewed  Nails Reviewed     Diet Order:   Diet Order            DIET DYS 3 Room service appropriate? Yes; Fluid consistency: Thin  Diet effective now                EDUCATION NEEDS:   Not appropriate for education at this time  Skin:  Skin Assessment: Reviewed RN Assessment (ecchymosis)  Last BM:  8/6- type 3  Height:   Ht Readings from Last 1 Encounters:  03/01/20 5\' 8"  (1.727 m)    Weight:   Wt Readings from Last 1 Encounters:  03/04/20 60.8 kg    Ideal Body Weight:  70 kg  BMI:  Body mass index is 20.38 kg/m.  Estimated Nutritional Needs:   Kcal:  1800-2100kcal/day  Protein:  90-105g/day  Fluid:  >1.8L/day  05/04/20 MS, RD, LDN Please refer to [facility] for RD and/or RD on-call/weekend/after hours pager

## 2020-03-04 NOTE — Progress Notes (Signed)
Patient ID: Raymond [name], male   DOB: 1960/11/18, 59 y.o.   MRN: [id number] [facility] PROGRESS NOTE  CELVIN TANEY GUY:[id number] DOB: August 01, 1960 DOA: 02/29/2020 PCP: [name]  HPI/Subjective: Patient answers no to most yes or no questions.  Unable to elaborate.  Sent in for a seizure and found to be in rapid A. fib.  Objective: Vitals:   03/04/20 1210 03/04/20 1536  BP: 118/68 111/73  Pulse: (!) 122 (!) 120  Resp: 17 18  Temp: 97.9 F (36.6 C) 98.1 F (36.7 C)  SpO2: 99% 99%    Intake/Output Summary (Last 24 hours) at 03/04/2020 1542 Last data filed at 03/04/2020 1438 Gross per 24 hour  Intake 603 ml  Output 1100 ml  Net -497 ml   Filed Weights   03/02/20 0330 03/03/20 0401 03/04/20 0421  Weight: 60.6 kg 61 kg 60.8 kg    ROS: Review of Systems  Respiratory: Negative for shortness of breath.   Cardiovascular: Negative for chest pain.  Gastrointestinal: Negative for abdominal pain.   Exam: Physical Exam HENT:     Nose: No mucosal edema.     Mouth/Throat:     Pharynx: No oropharyngeal exudate.  Eyes:     General: Lids are normal.     Conjunctiva/sclera: Conjunctivae normal.     Pupils: Pupils are equal, round, and reactive to light.  Cardiovascular:     Rate and Rhythm: Tachycardia present. Rhythm irregularly irregular.     Heart sounds: Normal heart sounds, S1 normal and S2 normal.  Pulmonary:     Breath sounds: Examination of the right-lower field reveals decreased breath sounds. Examination of the left-lower field reveals decreased breath sounds. Decreased breath sounds present. No wheezing, rhonchi or rales.  Abdominal:     Palpations: Abdomen is soft.     Tenderness: There is no abdominal tenderness.  Musculoskeletal:     Right ankle: No swelling.     Left ankle: No swelling.  Skin:    General: Skin is warm.     Findings: No rash.  Neurological:     Mental Status: He is alert.     Comments: Answers some yes or no questions.        Data Reviewed: Basic Metabolic Panel: Recent Labs  Lab 02/29/20 2218 03/02/20 0617 03/03/20 0729 03/04/20 0453  NA 136 137 138 140  K 4.7 4.2 4.7 4.1  CL 107 105 108 103  CO2 20* 22 21* 24  GLUCOSE 103* 121* 102* 108*  BUN 17 18 18  25*  CREATININE 1.05 1.13 1.29* 1.32*  CALCIUM 8.4* 8.6* 8.8* 9.0  MG  --  2.1  --   --    Liver Function Tests: Recent Labs  Lab 02/29/20 2218  AST 21  ALT 13  ALKPHOS 21*  BILITOT 0.7  PROT 6.2*  ALBUMIN 3.7   CBC: Recent Labs  Lab 02/29/20 2218 03/02/20 0617  WBC 6.4 6.1  HGB 10.9* 10.3*  HCT 32.1* 31.1*  MCV 93.9 96.6  PLT 160 141*   BNP (last 3 results) Recent Labs    03/01/20 0622  BNP 550.7*    Recent Results (from the past 240 hour(s))  SARS Coronavirus 2 by RT PCR (hospital order, performed in [facility] hospital lab) Nasopharyngeal Nasopharyngeal Swab     Status: None   Collection Time: 03/01/20  6:22 AM   Specimen: Nasopharyngeal Swab  Result Value Ref Range Status   SARS Coronavirus 2 NEGATIVE NEGATIVE Final    Comment: (NOTE)  SARS-CoV-2 target nucleic acids are NOT DETECTED.  The SARS-CoV-2 RNA is generally detectable in upper and lower respiratory specimens during the acute phase of infection. The lowest concentration of SARS-CoV-2 viral copies this assay can detect is 250 copies / mL. A negative result does not preclude SARS-CoV-2 infection and should not be used as the sole basis for treatment or other patient management decisions.  A negative result may occur with improper specimen collection / handling, submission of specimen other than nasopharyngeal swab, presence of viral mutation(s) within the areas targeted by this assay, and inadequate number of viral copies (<250 copies / mL). A negative result must be combined with clinical observations, patient history, and epidemiological information.  Fact Sheet for Patients:   BoilerBrush.com.cy  Fact Sheet for  Healthcare Providers: https://pope.com/  This test is not yet approved or  cleared by the [place] FDA and has been authorized for detection and/or diagnosis of SARS-CoV-2 by FDA under an Emergency Use Authorization (EUA).  This EUA will remain in effect (meaning this test can be used) for the duration of the COVID-19 declaration under Section 564(b)(1) of the Act, 21 U.S.C. section 360bbb-3(b)(1), unless the authorization is terminated or revoked sooner.  Performed at [facility], [address] [place], [place] [address]   MRSA PCR Screening     Status: None   Collection Time: 03/01/20  6:36 PM   Specimen: Nasopharyngeal  Result Value Ref Range Status   MRSA by PCR NEGATIVE NEGATIVE Final    Comment:        The GeneXpert MRSA Assay (FDA approved for NASAL specimens only), is one component of a comprehensive MRSA colonization surveillance program. It is not intended to diagnose MRSA infection nor to guide or monitor treatment for MRSA infections. Performed at [facility], [address] [place], [place] [address]      Studies: [facility] Chest [place] 1 View  Result Date: 03/03/2020 CLINICAL DATA:  History of congestive heart failure and mitral regurgitation EXAM: PORTABLE CHEST 1 VIEW COMPARISON:  March 01, 2020 FINDINGS: There is cardiomegaly with pulmonary venous hypertension. There is a slight degree of interstitial edema. There is no airspace opacity. No adenopathy. No bone lesions. IMPRESSION: Cardiomegaly with pulmonary vascular congestion. Mild interstitial edema. Suspect a degree of underlying congestive heart failure. No consolidation. Electronically Signed   By: Bretta Bang III M.D.   On: 03/03/2020 12:19    Scheduled Meds: . albuterol  2.5 mg Inhalation Q6H  . amiodarone  400 mg Oral BID  . aspirin EC  81 mg Oral Daily  . cholecalciferol  2,000 Units Oral Daily  . [START ON 03/05/2020] digoxin  0.0625 mg Oral Daily   . divalproex  500 mg Oral TID WC  . enoxaparin (LOVENOX) injection  40 mg Subcutaneous Q24H  . famotidine  40 mg Oral QHS  . feeding supplement (ENSURE ENLIVE)  237 mL Oral TID BM  . fenofibrate  160 mg Oral Daily  . furosemide  20 mg Oral BID  . levETIRAcetam  1,500 mg Oral QPM   And  . levETIRAcetam  1,000 mg Oral Daily  . loratadine  10 mg Oral Daily  . midodrine  2.5 mg Oral TID WC  . multivitamin with minerals  1 tablet Oral Daily  . pantoprazole  40 mg Oral Daily  . psyllium  1 packet Oral Daily  . risperiDONE  1 mg Oral Daily  . risperiDONE  2 mg Oral QHS  . sodium chloride flush  3 mL Intravenous Q12H  . vitamin B-12  1,000 mcg Oral Daily  . Vitamin D (Ergocalciferol)  50,000 Units Oral Weekly   Continuous Infusions: . sodium chloride      Assessment/Plan:  1. Atrial fibrillation with rapid ventricular response with hypotension.  Patient on 400 mg p.o. twice daily of amiodarone.  On low-dose digoxin.  Limited with other medications secondary to hypotension.  Started low-dose midodrine today. 2. Acute on chronic diastolic congestive heart failure with severe mitral regurgitation.  Switch Lasix over to p.o. today.  Patient not a surgical candidate as per cardiology. 3. Seizure disorder on Keppra and valproic acid 4. Weakness.  Today patient walked 14 feet with the physical therapist. 5. Hypertriglyceridemia on fenofibrate 6. GERD on PPI and H2 blocker    Code Status:     Code Status Orders  (From admission, onward)         Start     Ordered   03/01/20 0739  Full code  Continuous        03/01/20 0739        Code Status History    This patient has a current code status but no historical code status.   Advance Care Planning Activity     Family Communication: Spoke with [name] the group home owner.  Tried to reach patient's guardian [phone] but left a message Disposition Plan: Status is: Inpatient  Dispo: The patient is from: Group home               Anticipated d/c is to: Group home versus rehab              Anticipated d/c date is: We will need to walk a little bit better prior to going back to the group home.              Patient currently being treated for rapid atrial fibrillation, hypotension CHF with severe mitral regurgitation.  It is a difficult balance.  I asked palliative care to see the patient today.  Unable to reach to guardian to discuss full CODE STATUS.  Consultants:  Cardiology  Palliative care  Time spent: 28 minutes  [employer]

## 2020-03-05 DIAGNOSIS — N179 Acute kidney failure, unspecified: Secondary | ICD-10-CM

## 2020-03-05 DIAGNOSIS — N189 Chronic kidney disease, unspecified: Secondary | ICD-10-CM

## 2020-03-05 DIAGNOSIS — E43 Unspecified severe protein-calorie malnutrition: Secondary | ICD-10-CM

## 2020-03-05 LAB — BASIC METABOLIC PANEL
Anion gap: 15 (ref 5–15)
BUN: 37 mg/dL — ABNORMAL HIGH (ref 6–20)
CO2: 24 mmol/L (ref 22–32)
Calcium: 9.4 mg/dL (ref 8.9–10.3)
Chloride: 99 mmol/L (ref 98–111)
Creatinine, Ser: 1.56 mg/dL — ABNORMAL HIGH (ref 0.61–1.24)
GFR calc Af Amer: 56 mL/min — ABNORMAL LOW (ref >60)
GFR calc non Af Amer: 49 mL/min — ABNORMAL LOW (ref >60)
Glucose, Bld: 140 mg/dL — ABNORMAL HIGH (ref 70–99)
Potassium: 3.6 mmol/L (ref 3.5–5.1)
Sodium: 138 mmol/L (ref 135–145)

## 2020-03-05 MED ORDER — FUROSEMIDE 20 MG PO TABS
20.0000 mg | ORAL_TABLET | Freq: Every day | ORAL | Status: DC
  Administered 2020-03-06 – 2020-03-09 (×4): 20 mg via ORAL
  Filled 2020-03-05 (×4): qty 1

## 2020-03-05 NOTE — Progress Notes (Signed)
Patient ID: Raymond [name], male   DOB: 06-24-1961, 59 y.o.   MRN: [id number] [facility] PROGRESS NOTE  Raymond [name] LPF:[id number] DOB: [birth year] DOA: 02/29/2020 PCP: [name]  HPI/Subjective: Patient seen this morning.  He was lying in bed and more lethargic than usual.  Initially admitted with seizure and then found to be in rapid atrial fibrillation.  Objective: Vitals:   03/05/20 1208 03/05/20 1451  BP: 113/86   Pulse: (!) 54 (!) 111  Resp: 17 16  Temp: 97.9 F (36.6 C)   SpO2: 97% 96%    Intake/Output Summary (Last 24 hours) at 03/05/2020 1536 Last data filed at 03/05/2020 0900 Gross per 24 hour  Intake 363 ml  Output 500 ml  Net -137 ml   Filed Weights   03/03/20 0401 03/04/20 0421 03/05/20 0530  Weight: 61 kg 60.8 kg 58.1 kg    ROS: Review of Systems  Unable to perform ROS: Acuity of condition   Exam: Physical Exam HENT:     Nose: No mucosal edema.     Mouth/Throat:     Pharynx: No oropharyngeal exudate.  Eyes:     General: Lids are normal.     Conjunctiva/sclera: Conjunctivae normal.  Cardiovascular:     Rate and Rhythm: Tachycardia present. Rhythm irregularly irregular.     Heart sounds: Normal heart sounds, S1 normal and S2 normal.  Pulmonary:     Breath sounds: Examination of the right-lower field reveals decreased breath sounds. Examination of the left-lower field reveals decreased breath sounds. Decreased breath sounds present. No wheezing, rhonchi or rales.  Abdominal:     Palpations: Abdomen is soft.     Tenderness: There is no abdominal tenderness.  Musculoskeletal:     Right lower leg: No swelling.     Left lower leg: No swelling.  Skin:    General: Skin is warm.     Findings: No rash.  Neurological:     Mental Status: He is lethargic.       Data Reviewed: Basic Metabolic Panel: Recent Labs  Lab 02/29/20 2218 03/02/20 0617 03/03/20 0729 03/04/20 0453 03/05/20 0454  NA 136 137 138 140 138  K 4.7 4.2 4.7  4.1 3.6  CL 107 105 108 103 99  CO2 20* 22 21* 24 24  GLUCOSE 103* 121* 102* 108* 140*  BUN 17 18 18  25* 37*  CREATININE 1.05 1.13 1.29* 1.32* 1.56*  CALCIUM 8.4* 8.6* 8.8* 9.0 9.4  MG  --  2.1  --   --   --    Liver Function Tests: Recent Labs  Lab 02/29/20 2218  AST 21  ALT 13  ALKPHOS 21*  BILITOT 0.7  PROT 6.2*  ALBUMIN 3.7   CBC: Recent Labs  Lab 02/29/20 2218 03/02/20 0617  WBC 6.4 6.1  HGB 10.9* 10.3*  HCT 32.1* 31.1*  MCV 93.9 96.6  PLT 160 141*   BNP (last 3 results) Recent Labs    03/01/20 0622  BNP 550.7*      Recent Results (from the past 240 hour(s))  SARS Coronavirus 2 by RT PCR (hospital order, performed in [facility] hospital lab) Nasopharyngeal Nasopharyngeal Swab     Status: None   Collection Time: 03/01/20  6:22 AM   Specimen: Nasopharyngeal Swab  Result Value Ref Range Status   SARS Coronavirus 2 NEGATIVE NEGATIVE Final    Comment: (NOTE) SARS-CoV-2 target nucleic acids are NOT DETECTED.  The SARS-CoV-2 RNA is generally detectable in upper and lower respiratory specimens  during the acute phase of infection. The lowest concentration of SARS-CoV-2 viral copies this assay can detect is 250 copies / mL. A negative result does not preclude SARS-CoV-2 infection and should not be used as the sole basis for treatment or other patient management decisions.  A negative result may occur with improper specimen collection / handling, submission of specimen other than nasopharyngeal swab, presence of viral mutation(s) within the areas targeted by this assay, and inadequate number of viral copies (<250 copies / mL). A negative result must be combined with clinical observations, patient history, and epidemiological information.  Fact Sheet for Patients:   BoilerBrush.com.cy  Fact Sheet for Healthcare Providers: https://pope.com/  This test is not yet approved or  cleared by the [place] FDA  and has been authorized for detection and/or diagnosis of SARS-CoV-2 by FDA under an Emergency Use Authorization (EUA).  This EUA will remain in effect (meaning this test can be used) for the duration of the COVID-19 declaration under Section 564(b)(1) of the Act, 21 U.S.C. section 360bbb-3(b)(1), unless the authorization is terminated or revoked sooner.  Performed at [facility], [address] [place], [place] [address]   MRSA PCR Screening     Status: None   Collection Time: 03/01/20  6:36 PM   Specimen: Nasopharyngeal  Result Value Ref Range Status   MRSA by PCR NEGATIVE NEGATIVE Final    Comment:        The GeneXpert MRSA Assay (FDA approved for NASAL specimens only), is one component of a comprehensive MRSA colonization surveillance program. It is not intended to diagnose MRSA infection nor to guide or monitor treatment for MRSA infections. Performed at [facility], [address] [place], [place] [address]      Scheduled Meds: . albuterol  2.5 mg Inhalation TID  . amiodarone  400 mg Oral BID  . aspirin EC  81 mg Oral Daily  . cholecalciferol  2,000 Units Oral Daily  . digoxin  0.0625 mg Oral Daily  . divalproex  500 mg Oral TID WC  . enoxaparin (LOVENOX) injection  40 mg Subcutaneous Q24H  . famotidine  40 mg Oral QHS  . feeding supplement (ENSURE ENLIVE)  237 mL Oral TID BM  . fenofibrate  160 mg Oral Daily  . [START ON 03/06/2020] furosemide  20 mg Oral Daily  . levETIRAcetam  1,500 mg Oral QPM   And  . levETIRAcetam  1,000 mg Oral Daily  . loratadine  10 mg Oral Daily  . midodrine  2.5 mg Oral TID WC  . multivitamin with minerals  1 tablet Oral Daily  . pantoprazole  40 mg Oral Daily  . psyllium  1 packet Oral Daily  . risperiDONE  1 mg Oral Daily  . risperiDONE  2 mg Oral QHS  . sodium chloride flush  3 mL Intravenous Q12H  . vitamin B-12  1,000 mcg Oral Daily  . Vitamin D (Ergocalciferol)  50,000 Units Oral Weekly   Continuous  Infusions: . sodium chloride      Assessment/Plan:  1. Atrial fibrillation with rapid ventricular response.  Limited with medication secondary to hypotension.  Patient on amiodarone and low-dose digoxin.  On low-dose midodrine to try to elevate blood pressure. 2. Acute on chronic diastolic congestive heart failure with severe mitral regurgitation.  Hold Lasix today and restart daily Lasix tomorrow.  Not a surgical candidate.  Difficult balance with the patient's acute kidney injury. 3. Acute kidney injury.  Cannot give IV fluids with  heart failure.  Hold Lasix today.  Encourage eating.  Patient will need feeding with each meal. 4. Severe protein calorie malnutrition with weight loss and poor appetite 5. Seizure disorder on Keppra and valproic acid 6. Weakness 7. Hypertriglyceridemia on fenofibrate 8. GERD on PPI and H2 blocker    Code Status:     Code Status Orders  (From admission, onward)         Start     Ordered   03/01/20 0739  Full code  Continuous        03/01/20 0739        Code Status History    This patient has a current code status but no historical code status.   Advance Care Planning Activity     Family Communication: Spoke with [name] at group home Disposition Plan: Status is: Inpatient   Dispo: The patient is from: Group home              Anticipated d/c is to: Potential rehab versus group home              Anticipated d/c date is: Patient has been a very difficult balance with rapid atrial fibrillation hypotension and heart failure with severe mitral regurgitation              Patient currently lethargic today and does not look good.  Overall prognosis is not good.  Consultants:  Cardiology  Time spent: 28 minutes   [employer]rey [employer]

## 2020-03-05 NOTE — TOC Progression Note (Signed)
Transition of Care [facility]) - Progression Note    Patient Details  Name: Raymond [name] MRN: [id number] Date of Birth: Mar 15, 1961  [facility]) CM/SW Contact  Ashley Royalty Lutricia Feil, RN Phone Number:[phone] 03/05/2020, 5:45 PM  Clinical Narrative:     MD notes indicated: Patient has been a very difficult balance with rapid atrial fibrillation hypotension and heart failure with severe mitral regurgitation. Patient currently lethargic today and does not look good.  Overall prognosis is not good.  TOC will continue to follow discharge plans.        Expected Discharge Plan and Services                                                 Social Determinants of Health (SDOH) Interventions    Readmission Risk Interventions No flowsheet data found.

## 2020-03-05 NOTE — Plan of Care (Signed)
  Problem: Education: Goal: Knowledge of General Education information will improve Description Including pain rating scale, medication(s)/side effects and non-pharmacologic comfort measures Outcome: Progressing   

## 2020-03-05 NOTE — Progress Notes (Signed)
[facility] Cardiology    SUBJECTIVE: Patient appears to be in some respiratory distress at this point week unable to engage in interview he has underlying severe mental retardation but appears to be ill-appearing tachypnea.   Vitals:   03/05/20 0530 03/05/20 0741 03/05/20 0758 03/05/20 0817  BP: 95/78 107/76 112/68   Pulse: (!) 105  (!) 122 (!) 103  Resp:   17 18  Temp: 98.1 F (36.7 C) 98.7 F (37.1 C) 98.4 F (36.9 C)   TempSrc: Oral     SpO2: 98% 98% 95% 94%  Weight: 58.1 kg     Height:         Intake/Output Summary (Last 24 hours) at 03/05/2020 2952 Last data filed at 03/04/2020 2115 Gross per 24 hour  Intake 363 ml  Output 1200 ml  Net -837 ml      PHYSICAL EXAM  General: Well developed, well nourished, respiratory acute distress HEENT:  Normocephalic and atramatic Neck:  No JVD.  Lungs: Dullness  bilaterally to auscultation and percussion. Heart: Irregular irregular heart rate. Normal S1 and S2 without gallops or 4/6 murmurs.  Abdomen: Bowel sounds are positive, abdomen soft and non-tender  Msk:  Back normal, normal gait. Normal strength and tone for age. Extremities: No clubbing, cyanosis or edema.   Neuro: Alert and oriented X 2. Psych:  Good affect, responds appropriately   LABS: Basic Metabolic Panel: Recent Labs    03/04/20 0453 03/05/20 0454  NA 140 138  K 4.1 3.6  CL 103 99  CO2 24 24  GLUCOSE 108* 140*  BUN 25* 37*  CREATININE 1.32* 1.56*  CALCIUM 9.0 9.4   Liver Function Tests: No results for input(s): AST, ALT, ALKPHOS, BILITOT, PROT, ALBUMIN in the last 72 hours. No results for input(s): LIPASE, AMYLASE in the last 72 hours. CBC: No results for input(s): WBC, NEUTROABS, HGB, HCT, MCV, PLT in the last 72 hours. Cardiac Enzymes: No results for input(s): CKTOTAL, CKMB, CKMBINDEX, TROPONINI in the last 72 hours. BNP: Invalid input(s): POCBNP D-Dimer: No results for input(s): DDIMER in the last 72 hours. Hemoglobin A1C: No results for  input(s): HGBA1C in the last 72 hours. Fasting Lipid Panel: No results for input(s): CHOL, HDL, LDLCALC, TRIG, CHOLHDL, LDLDIRECT in the last 72 hours. Thyroid Function Tests: No results for input(s): TSH, T4TOTAL, T3FREE, THYROIDAB in the last 72 hours.  Invalid input(s): FREET3 Anemia Panel: No results for input(s): VITAMINB12, FOLATE, FERRITIN, TIBC, IRON, RETICCTPCT in the last 72 hours.  DG Chest Port 1 View  Result Date: 03/03/2020 CLINICAL DATA:  History of congestive heart failure and mitral regurgitation EXAM: PORTABLE CHEST 1 VIEW COMPARISON:  March 01, 2020 FINDINGS: There is cardiomegaly with pulmonary venous hypertension. There is a slight degree of interstitial edema. There is no airspace opacity. No adenopathy. No bone lesions. IMPRESSION: Cardiomegaly with pulmonary vascular congestion. Mild interstitial edema. Suspect a degree of underlying congestive heart failure. No consolidation. Electronically Signed   By: Bretta Bang III M.D.   On: 03/03/2020 12:19     Echo moderately depressed overall left ventricular function 35 to 40% severe MR  TELEMETRY: Rapid atrial fibrillation rate of around 115-120  ASSESSMENT AND PLAN:  Principal Problem:   Acute on chronic diastolic CHF (congestive heart failure) (HCC) Active Problems:   Hypertriglyceridemia   Weakness   HTN (hypertension)   GERD (gastroesophageal reflux disease)   New onset atrial fibrillation (HCC)   Atrial fibrillation with RVR (HCC)   Seizure disorder (HCC)   Acute  on chronic diastolic (congestive) heart failure (HCC)   Hypotension   Mitral valve insufficiency   Goals of care, counseling/discussion   Palliative care by specialist   DNR (do not resuscitate) discussion   Protein-calorie malnutrition, severe    Plan Atrial fibrillation rapid ventricular response we will continue amiodarone digoxin limited options because of hypotension Congestive heart failure continue diuretics when possible because  of hypotension Consider nephrology input for renal insufficiency GERD continue omeprazole therapy as necessary Agree with medication for seizure disorder Continue therapy for hyperlipidemia Generalized weakness relatively persistent related to heart failure cardiomyopathy Will strongly recommend palliative care at this point her options for therapy are very limited with his underlying condition   Alwyn Pea, MD 03/05/2020 9:28 AM

## 2020-03-06 LAB — COMPREHENSIVE METABOLIC PANEL
ALT: 11 U/L (ref 0–44)
AST: 21 U/L (ref 15–41)
Albumin: 4.1 g/dL (ref 3.5–5.0)
Alkaline Phosphatase: 26 U/L — ABNORMAL LOW (ref 38–126)
Anion gap: 12 (ref 5–15)
BUN: 41 mg/dL — ABNORMAL HIGH (ref 6–20)
CO2: 26 mmol/L (ref 22–32)
Calcium: 9.4 mg/dL (ref 8.9–10.3)
Chloride: 98 mmol/L (ref 98–111)
Creatinine, Ser: 1.28 mg/dL — ABNORMAL HIGH (ref 0.61–1.24)
GFR calc Af Amer: >60 mL/min (ref >60)
GFR calc non Af Amer: >60 mL/min (ref >60)
Glucose, Bld: 129 mg/dL — ABNORMAL HIGH (ref 70–99)
Potassium: 4.2 mmol/L (ref 3.5–5.1)
Sodium: 136 mmol/L (ref 135–145)
Total Bilirubin: 1.4 mg/dL — ABNORMAL HIGH (ref 0.3–1.2)
Total Protein: 7.4 g/dL (ref 6.5–8.1)

## 2020-03-06 LAB — CBC WITH DIFFERENTIAL/PLATELET
Abs Immature Granulocytes: 0.04 10*3/uL (ref 0.00–0.07)
Basophils Absolute: 0 10*3/uL (ref 0.0–0.1)
Basophils Relative: 0 %
Eosinophils Absolute: 0 10*3/uL (ref 0.0–0.5)
Eosinophils Relative: 0 %
HCT: 37.8 % — ABNORMAL LOW (ref 39.0–52.0)
Hemoglobin: 12.6 g/dL — ABNORMAL LOW (ref 13.0–17.0)
Immature Granulocytes: 0 %
Lymphocytes Relative: 22 %
Lymphs Abs: 2.2 10*3/uL (ref 0.7–4.0)
MCH: 31.5 pg (ref 26.0–34.0)
MCHC: 33.3 g/dL (ref 30.0–36.0)
MCV: 94.5 fL (ref 80.0–100.0)
Monocytes Absolute: 1.7 10*3/uL — ABNORMAL HIGH (ref 0.1–1.0)
Monocytes Relative: 17 %
Neutro Abs: 5.7 10*3/uL (ref 1.7–7.7)
Neutrophils Relative %: 61 %
Platelets: 166 10*3/uL (ref 150–400)
RBC: 4 MIL/uL — ABNORMAL LOW (ref 4.22–5.81)
RDW: 15.4 % (ref 11.5–15.5)
WBC: 9.6 10*3/uL (ref 4.0–10.5)
nRBC: 0 % (ref 0.0–0.2)

## 2020-03-06 LAB — VALPROIC ACID LEVEL: Valproic Acid Lvl: 47 ug/mL — ABNORMAL LOW (ref 50.0–100.0)

## 2020-03-06 LAB — DIGOXIN LEVEL: Digoxin Level: 0.9 ng/mL (ref 0.8–2.0)

## 2020-03-06 MED ORDER — ALBUTEROL SULFATE (2.5 MG/3ML) 0.083% IN NEBU: 2.5000 mg | INHALATION_SOLUTION | Freq: Four times a day (QID) | RESPIRATORY_TRACT | Status: DC | PRN

## 2020-03-06 MED ORDER — METOPROLOL TARTRATE 5 MG/5ML IV SOLN
5.0000 mg | INTRAVENOUS | Status: DC | PRN
  Administered 2020-03-06: 5 mg via INTRAVENOUS
  Filled 2020-03-06: qty 5

## 2020-03-06 MED ORDER — METOPROLOL SUCCINATE ER 25 MG PO TB24
25.0000 mg | ORAL_TABLET | Freq: Every day | ORAL | Status: DC
  Administered 2020-03-07: 25 mg via ORAL
  Filled 2020-03-06: qty 1

## 2020-03-06 NOTE — Progress Notes (Signed)
Patient ID: Raymond [name], male   DOB: February 19, 1961, 59 y.o.   MRN: [id number] [facility] PROGRESS NOTE  MAYNOR MWANGI YQI:[id number] DOB: [birth year] DOA: 02/29/2020 PCP: [name]  HPI/Subjective: Patient seen this morning was sitting up in bed and I put the breakfast tray in front of him and he started feeding himself some food.  He answers some yes or no questions.  Does not elaborate much at all.  Admitted initially with seizure.  Objective: Vitals:   03/06/20 0755 03/06/20 1146  BP: 107/82 (!) 122/92  Pulse: (!) 109 95  Resp: 16 16  Temp: 97.7 F (36.5 C)   SpO2: 96% 96%    Intake/Output Summary (Last 24 hours) at 03/06/2020 1352 Last data filed at 03/06/2020 0936 Gross per 24 hour  Intake 483 ml  Output 1400 ml  Net -917 ml   Filed Weights   03/04/20 0421 03/05/20 0530 03/06/20 0328  Weight: 60.8 kg 58.1 kg 55.7 kg    ROS: Review of Systems  Respiratory: Negative for shortness of breath.   Cardiovascular: Negative for chest pain.  Gastrointestinal: Negative for abdominal pain.   Exam: Physical Exam HENT:     Nose: No mucosal edema.     Mouth/Throat:     Pharynx: No oropharyngeal exudate.  Eyes:     General: Lids are normal.     Conjunctiva/sclera: Conjunctivae normal.     Pupils: Pupils are equal, round, and reactive to light.  Cardiovascular:     Rate and Rhythm: Tachycardia present. Rhythm irregularly irregular.     Heart sounds: S1 normal and S2 normal. Murmur heard.  Systolic murmur is present with a grade of 3/6.   Pulmonary:     Breath sounds: Examination of the right-lower field reveals decreased breath sounds. Examination of the left-lower field reveals decreased breath sounds. Decreased breath sounds present. No wheezing, rhonchi or rales.  Abdominal:     Palpations: Abdomen is soft.     Tenderness: There is no abdominal tenderness.  Musculoskeletal:     Right ankle: No swelling.     Left ankle: No swelling.  Skin:     General: Skin is warm.     Findings: No rash.  Neurological:     Mental Status: He is alert.     Comments: Answers some yes/no questions.       Data Reviewed: Basic Metabolic Panel: Recent Labs  Lab 03/02/20 0617 03/03/20 0729 03/04/20 0453 03/05/20 0454 03/06/20 0648  NA 137 138 140 138 136  K 4.2 4.7 4.1 3.6 4.2  CL 105 108 103 99 98  CO2 22 21* 24 24 26   GLUCOSE 121* 102* 108* 140* 129*  BUN 18 18 25* 37* 41*  CREATININE 1.13 1.29* 1.32* 1.56* 1.28*  CALCIUM 8.6* 8.8* 9.0 9.4 9.4  MG 2.1  --   --   --   --    Liver Function Tests: Recent Labs  Lab 02/29/20 2218 03/06/20 0648  AST 21 21  ALT 13 11  ALKPHOS 21* 26*  BILITOT 0.7 1.4*  PROT 6.2* 7.4  ALBUMIN 3.7 4.1   CBC: Recent Labs  Lab 02/29/20 2218 03/02/20 0617 03/06/20 0648  WBC 6.4 6.1 9.6  NEUTROABS  --   --  5.7  HGB 10.9* 10.3* 12.6*  HCT 32.1* 31.1* 37.8*  MCV 93.9 96.6 94.5  PLT 160 141* 166   BNP (last 3 results) Recent Labs    03/01/20 0622  BNP 550.7*  Recent Results (from the past 240 hour(s))  SARS Coronavirus 2 by RT PCR (hospital order, performed in [facility] hospital lab) Nasopharyngeal Nasopharyngeal Swab     Status: None   Collection Time: 03/01/20  6:22 AM   Specimen: Nasopharyngeal Swab  Result Value Ref Range Status   SARS Coronavirus 2 NEGATIVE NEGATIVE Final    Comment: (NOTE) SARS-CoV-2 target nucleic acids are NOT DETECTED.  The SARS-CoV-2 RNA is generally detectable in upper and lower respiratory specimens during the acute phase of infection. The lowest concentration of SARS-CoV-2 viral copies this assay can detect is 250 copies / mL. A negative result does not preclude SARS-CoV-2 infection and should not be used as the sole basis for treatment or other patient management decisions.  A negative result may occur with improper specimen collection / handling, submission of specimen other than nasopharyngeal swab, presence of viral mutation(s) within  the areas targeted by this assay, and inadequate number of viral copies (<250 copies / mL). A negative result must be combined with clinical observations, patient history, and epidemiological information.  Fact Sheet for Patients:   BoilerBrush.com.cy  Fact Sheet for Healthcare Providers: https://pope.com/  This test is not yet approved or  cleared by the [place] FDA and has been authorized for detection and/or diagnosis of SARS-CoV-2 by FDA under an Emergency Use Authorization (EUA).  This EUA will remain in effect (meaning this test can be used) for the duration of the COVID-19 declaration under Section 564(b)(1) of the Act, 21 U.S.C. section 360bbb-3(b)(1), unless the authorization is terminated or revoked sooner.  Performed at [facility], [address] [place], [place] [address]   MRSA PCR Screening     Status: None   Collection Time: 03/01/20  6:36 PM   Specimen: Nasopharyngeal  Result Value Ref Range Status   MRSA by PCR NEGATIVE NEGATIVE Final    Comment:        The GeneXpert MRSA Assay (FDA approved for NASAL specimens only), is one component of a comprehensive MRSA colonization surveillance program. It is not intended to diagnose MRSA infection nor to guide or monitor treatment for MRSA infections. Performed at [facility], [address] [place], [place] [address]       Scheduled Meds: . albuterol  2.5 mg Inhalation TID  . amiodarone  400 mg Oral BID  . aspirin EC  81 mg Oral Daily  . cholecalciferol  2,000 Units Oral Daily  . digoxin  0.0625 mg Oral Daily  . divalproex  500 mg Oral TID WC  . enoxaparin (LOVENOX) injection  40 mg Subcutaneous Q24H  . famotidine  40 mg Oral QHS  . feeding supplement (ENSURE ENLIVE)  237 mL Oral TID BM  . fenofibrate  160 mg Oral Daily  . furosemide  20 mg Oral Daily  . levETIRAcetam  1,500 mg Oral QPM   And  . levETIRAcetam  1,000 mg Oral  Daily  . loratadine  10 mg Oral Daily  . metoprolol succinate  25 mg Oral Daily  . midodrine  2.5 mg Oral TID WC  . multivitamin with minerals  1 tablet Oral Daily  . pantoprazole  40 mg Oral Daily  . psyllium  1 packet Oral Daily  . risperiDONE  1 mg Oral Daily  . risperiDONE  2 mg Oral QHS  . sodium chloride flush  3 mL Intravenous Q12H  . vitamin B-12  1,000 mcg Oral Daily  . Vitamin D (Ergocalciferol)  50,000 Units Oral Weekly  Continuous Infusions: . sodium chloride      Assessment/Plan:  1. Atrial fibrillation with rapid ventricular response.  This afternoon called with elevated heart rate.  Patient on amiodarone, low-dose digoxin.  Since blood pressure better with midodrine I will add Toprol-XL and as needed IV metoprolol. 2. Acute on chronic diastolic congestive heart failure with severe mitral regurgitation.  Restarted Lasix 20 mg p.o. daily.  As per cardiology not a surgical candidate.  Unfortunately this is a difficult balance with the patient's acute kidney injury and atrial fibrillation with rapid ventricular response. 3. Acute kidney injury.  Creatinine improved today.  Restart low-dose Lasix once a day.  Creatinine improved from 1.56 down to 1.28. 4. Severe protein calorie malnutrition 5. Seizure disorder on Keppra and valproic acid 6. Weakness 7. Hypertriglyceridemia on fenofibrate 8. GERD on PPI and H2 blocker      Code Status:     Code Status Orders  (From admission, onward)         Start     Ordered   03/01/20 0739  Full code  Continuous        03/01/20 0739        Code Status History    This patient has a current code status but no historical code status.   Advance Care Planning Activity     Family Communication: Left message for [name] Disposition Plan: Status is: Inpatient  Dispo: The patient is from: Group home              Anticipated d/c is to: Group home              Anticipated d/c date is: We will need another day or so to balance out  heart rate, blood pressure and CHF              Patient currently being treated for atrial fibrillation with rapid ventricular response now on 3 medications and also on medications to lift up the blood pressure.  Patient not a candidate for valve replacement for severe mitral regurgitation  Consultants:  Cardiology  Time spent: 28 minutes  Caylah Plouff [employer]

## 2020-03-06 NOTE — Plan of Care (Signed)
  Problem: Clinical Measurements: Goal: Cardiovascular complication will be avoided Outcome: Progressing Note: Pt on Po amio and can get IV metop Q1h if needed   Problem: Nutrition: Goal: Adequate nutrition will be maintained Outcome: Progressing

## 2020-03-07 ENCOUNTER — Encounter: Payer: Medicare Other | Admitting: Physical Therapy

## 2020-03-07 LAB — BASIC METABOLIC PANEL
Anion gap: 11 (ref 5–15)
BUN: 40 mg/dL — ABNORMAL HIGH (ref 6–20)
CO2: 28 mmol/L (ref 22–32)
Calcium: 8.9 mg/dL (ref 8.9–10.3)
Chloride: 98 mmol/L (ref 98–111)
Creatinine, Ser: 1.16 mg/dL (ref 0.61–1.24)
GFR calc Af Amer: >60 mL/min (ref >60)
GFR calc non Af Amer: >60 mL/min (ref >60)
Glucose, Bld: 110 mg/dL — ABNORMAL HIGH (ref 70–99)
Potassium: 4.2 mmol/L (ref 3.5–5.1)
Sodium: 137 mmol/L (ref 135–145)

## 2020-03-07 MED ORDER — MIDODRINE HCL 5 MG PO TABS
5.0000 mg | ORAL_TABLET | Freq: Three times a day (TID) | ORAL | Status: DC
  Administered 2020-03-07 – 2020-03-08 (×3): 5 mg via ORAL
  Filled 2020-03-07 (×3): qty 1

## 2020-03-07 NOTE — Progress Notes (Signed)
Mobility Specialist - Progress Note   03/07/20 1324  Mobility  Activity Dangled on edge of bed  Level of Assistance Minimal assist, patient does 75% or more  Assistive Device None  Mobility Response Tolerated fair  Mobility performed by Mobility specialist  $Mobility charge 1 Mobility    Pre-mobility: 91 HR, 90/64 BP, 94% SpO2 During mobility: 98 HR, 90/64 BP, 98% SpO2   Pt was in bed upon arrival. Pt agreed to session by communicating w/ a head nod. Pt needed min. A getting to EOB. After siitting EOB for a couple of minutes, pt told mobility specialist to "hurry" when taking vitals at EOB. Pt was unable to stand w/ mobility specialist. Further mobility limited d/t pt's frustration w/ not being able to stand and ambulate. Pt was laid back in bed, which resolved his frustration. Pt left in bed with call bell and phone in reach. Nurse was notified.    Raymond [name] Mobility Specialist  03/07/20, 1:36 PM

## 2020-03-07 NOTE — Progress Notes (Signed)
NT Mykia notified me patient had removed his IV. I placed ordere for IV team re insertion. Site clean, dry and intact with no bleeding from his removal.

## 2020-03-07 NOTE — Progress Notes (Signed)
48 Hour Calorie Count   Estimated Nutritional Needs:   Kcal:  1800-2100kcal/day Protein:  90-105g/day Fluid:  >1.8L/day  Day 1:  Breakfast 8/7: Pt consumed 25% of meal  Total- 205kcal and 5g protein  Lunch 8/7: Pt consumed 25% of meal  Total- 176kcal and 9g protein   Dinner 8/7: pt consumed 0% of meal  Supplements not documented  Day 2:   Breakfast 8/8: pt consumed 20% of meal  Total: 145kcal and 4g protein   Lunch 8/8: Pt consumed 0% of meal  Dinner 8/8: Pt consumed 75% of meal  Total- 613kcal and 22g protein  Supplements not documented  Day 3:  Breakfast 8/9: Pt consumed 50% of meal and has drank 2 Ensures  Total- 1125kcal and 50g protein    Total Intake Day 1: 381 kcal (21% estimated needs) and  14g protein (16% estimated needs)  Total Intake Day 2: 758 kcal (42% estimated needs) and  26g protein (29% estimated needs)  Total Intake Day 3: 1125kcal (63% estimated needs) and  50g protein (55% estimated needs)  Betsey Holiday MS, RD, LDN Please refer to [facility] for RD and/or RD on-call/weekend/after hours pager

## 2020-03-07 NOTE — Progress Notes (Signed)
Patient removed IV. Placed order for IV consult to replace. Contacted MD Wieting and he messaged me back stating to leave the IV out for now. MD Wieting aware patient has no IV access at this time and patient is still full code.

## 2020-03-07 NOTE — Care Management Important Message (Signed)
Important Message  Patient Details  Name: Raymond [name] MRN: [id number] Date of Birth: 1960-11-01   Medicare Important Message Given:  Other (see comment)  Left message with patient's legal guardian, [name].  Encouraged callback to review and obtain verbal consent for Medicare IM.   [name] 03/07/2020, 12:05 PM

## 2020-03-07 NOTE — Progress Notes (Signed)
Nutrition Follow Up Note   DOCUMENTATION CODES:   Severe malnutrition in context of social or environmental circumstances  INTERVENTION:   Ensure Enlive po TID, each supplement provides 350 kcal and 20 grams of protein  Magic cup TID with meals, each supplement provides 290 kcal and 9 grams of protein  MVI daily   Dysphagia 3 diet   Bowel regimen per MD  NUTRITION DIAGNOSIS:   Severe Malnutrition related to social / environmental circumstances as evidenced by severe fat depletion, severe muscle depletion  GOAL:   Patient will meet greater than or equal to 90% of their needs  -progressing   MONITOR:   PO intake, Supplement acceptance, Labs, Weight trends, Skin, I & O's  ASSESSMENT:   59 y.o. male  with past medical history of HTN/HLD, dCHF, mental retardation with aphasia, OCD (water-drinking), severe mitral regurgitation, epilepsy with seizure disorder, systolic murmur, colonoscopy/EGD August 2020 admitted on 02/29/2020 with acute on chronic diastolic heart failure, new onset A. fib with RVR and possible seizure   Forty-eight hour calorie count completed; pt meeting anywhere from 16-63% of his estimated needs. Pt's oral intake seems to be improving every day. Pt is drinking 100% of his Ensure supplements today. Pt is also receiving Magic Cups on his meal trays. Recommend continue supplements and MVI daily. Pt would benefit from supplements after discharge. Per chart, pt is weight stable since admit. Pt with type 1 BM yesterday; recommend bowel regimen as needed per MD.   Medications reviewed and include: aspirin, lovenox, pepcid, lasix, protonix, psyllium, B12, Vitamin D, MVI  Labs reviewed: BUN 40(H)  Diet Order:   Diet Order            DIET DYS 3 Room service appropriate? Yes; Fluid consistency: Thin  Diet effective now                EDUCATION NEEDS:   Not appropriate for education at this time  Skin:  Skin Assessment: Reviewed RN Assessment (ecchymosis)  Last  BM:  8/8- type 1  Height:   Ht Readings from Last 1 Encounters:  03/01/20 5\' 8"  (1.727 m)    Weight:   Wt Readings from Last 1 Encounters:  03/07/20 58.9 kg    Ideal Body Weight:  70 kg  BMI:  Body mass index is 19.74 kg/m.  Estimated Nutritional Needs:   Kcal:  1800-2100kcal/day  Protein:  90-105g/day  Fluid:  >1.8L/day  05/07/20 MS, RD, LDN Please refer to [facility] for RD and/or RD on-call/weekend/after hours pager

## 2020-03-07 NOTE — Progress Notes (Signed)
PIV consult: Pt not currently receiving IV meds. Per Sheria Lang, RN. OK to leave IV out for now. Please enter STAT consult if pt has urgent/emergent need for IV meds.

## 2020-03-07 NOTE — Progress Notes (Signed)
Patient ID: Raymond [name], male   DOB: April 20, 1961, 59 y.o.   MRN: [id number] [facility] PROGRESS NOTE  Raymond [name]:[id number] DOB: [birth year] DOA: 02/29/2020 PCP: [name]  HPI/Subjective: Patient seen this morning after stimulation he was often answered yes or no questions.  Always answers no to all questions.  Initially admitted with seizure but then found to be in rapid atrial fibrillation and heart failure.  Objective: Vitals:   03/07/20 0735 03/07/20 1119  BP: 101/73 91/63  Pulse: 93 (!) 56  Resp: 16 16  Temp: 98.4 F (36.9 C) 97.9 F (36.6 C)  SpO2: 99% 94%    Intake/Output Summary (Last 24 hours) at 03/07/2020 1511 Last data filed at 03/07/2020 1454 Gross per 24 hour  Intake 717 ml  Output 700 ml  Net 17 ml   Filed Weights   03/05/20 0530 03/06/20 0328 03/07/20 0513  Weight: 58.1 kg 55.7 kg 58.9 kg    ROS: Review of Systems  Respiratory: Negative for shortness of breath.   Cardiovascular: Negative for chest pain.  Gastrointestinal: Negative for abdominal pain.   Exam: Physical Exam HENT:     Nose: No mucosal edema.     Mouth/Throat:     Pharynx: No oropharyngeal exudate.  Eyes:     General: Lids are normal.     Conjunctiva/sclera: Conjunctivae normal.  Cardiovascular:     Rate and Rhythm: Normal rate. Rhythm regularly irregular.     Heart sounds: Normal heart sounds, S1 normal and S2 normal.  Pulmonary:     Breath sounds: No decreased breath sounds, wheezing, rhonchi or rales.  Abdominal:     Palpations: Abdomen is soft.     Tenderness: There is no abdominal tenderness.  Musculoskeletal:     Right ankle: No swelling.     Left ankle: No swelling.  Skin:    General: Skin is warm.     Findings: No rash.  Neurological:     Mental Status: He is alert.     Comments: Answers yes or no questions       Data Reviewed: Basic Metabolic Panel: Recent Labs  Lab 03/02/20 0617 03/02/20 0617 03/03/20 0729 03/04/20 0453  03/05/20 0454 03/06/20 0648 03/07/20 0602  NA 137   < > 138 140 138 136 137  K 4.2   < > 4.7 4.1 3.6 4.2 4.2  CL 105   < > 108 103 99 98 98  CO2 22   < > 21* 24 24 26 28   GLUCOSE 121*   < > 102* 108* 140* 129* 110*  BUN 18   < > 18 25* 37* 41* 40*  CREATININE 1.13   < > 1.29* 1.32* 1.56* 1.28* 1.16  CALCIUM 8.6*   < > 8.8* 9.0 9.4 9.4 8.9  MG 2.1  --   --   --   --   --   --    < > = values in this interval not displayed.   Liver Function Tests: Recent Labs  Lab 02/29/20 2218 03/06/20 0648  AST 21 21  ALT 13 11  ALKPHOS 21* 26*  BILITOT 0.7 1.4*  PROT 6.2* 7.4  ALBUMIN 3.7 4.1   CBC: Recent Labs  Lab 02/29/20 2218 03/02/20 0617 03/06/20 0648  WBC 6.4 6.1 9.6  NEUTROABS  --   --  5.7  HGB 10.9* 10.3* 12.6*  HCT 32.1* 31.1* 37.8*  MCV 93.9 96.6 94.5  PLT 160 141* 166  BNP (last 3 results) Recent Labs  03/01/20 0622  BNP 550.7*     Recent Results (from the past 240 hour(s))  SARS Coronavirus 2 by RT PCR (hospital order, performed in [facility] hospital lab) Nasopharyngeal Nasopharyngeal Swab     Status: None   Collection Time: 03/01/20  6:22 AM   Specimen: Nasopharyngeal Swab  Result Value Ref Range Status   SARS Coronavirus 2 NEGATIVE NEGATIVE Final    Comment: (NOTE) SARS-CoV-2 target nucleic acids are NOT DETECTED.  The SARS-CoV-2 RNA is generally detectable in upper and lower respiratory specimens during the acute phase of infection. The lowest concentration of SARS-CoV-2 viral copies this assay can detect is 250 copies / mL. A negative result does not preclude SARS-CoV-2 infection and should not be used as the sole basis for treatment or other patient management decisions.  A negative result may occur with improper specimen collection / handling, submission of specimen other than nasopharyngeal swab, presence of viral mutation(s) within the areas targeted by this assay, and inadequate number of viral copies (<250 copies / mL). A negative result  must be combined with clinical observations, patient history, and epidemiological information.  Fact Sheet for Patients:   BoilerBrush.com.cy  Fact Sheet for Healthcare Providers: https://pope.com/  This test is not yet approved or  cleared by the [place] FDA and has been authorized for detection and/or diagnosis of SARS-CoV-2 by FDA under an Emergency Use Authorization (EUA).  This EUA will remain in effect (meaning this test can be used) for the duration of the COVID-19 declaration under Section 564(b)(1) of the Act, 21 U.S.C. section 360bbb-3(b)(1), unless the authorization is terminated or revoked sooner.  Performed at [facility], [address] [place], [place] [address]   MRSA PCR Screening     Status: None   Collection Time: 03/01/20  6:36 PM   Specimen: Nasopharyngeal  Result Value Ref Range Status   MRSA by PCR NEGATIVE NEGATIVE Final    Comment:        The GeneXpert MRSA Assay (FDA approved for NASAL specimens only), is one component of a comprehensive MRSA colonization surveillance program. It is not intended to diagnose MRSA infection nor to guide or monitor treatment for MRSA infections. Performed at [facility], [address] [place], [place] [address]      Scheduled Meds: . amiodarone  400 mg Oral BID  . aspirin EC  81 mg Oral Daily  . cholecalciferol  2,000 Units Oral Daily  . digoxin  0.0625 mg Oral Daily  . divalproex  500 mg Oral TID WC  . enoxaparin (LOVENOX) injection  40 mg Subcutaneous Q24H  . famotidine  40 mg Oral QHS  . feeding supplement (ENSURE ENLIVE)  237 mL Oral TID BM  . fenofibrate  160 mg Oral Daily  . furosemide  20 mg Oral Daily  . levETIRAcetam  1,500 mg Oral QPM   And  . levETIRAcetam  1,000 mg Oral Daily  . loratadine  10 mg Oral Daily  . metoprolol succinate  25 mg Oral Daily  . midodrine  2.5 mg Oral TID WC  . multivitamin with minerals  1  tablet Oral Daily  . pantoprazole  40 mg Oral Daily  . psyllium  1 packet Oral Daily  . risperiDONE  1 mg Oral Daily  . risperiDONE  2 mg Oral QHS  . sodium chloride flush  3 mL Intravenous Q12H  . vitamin B-12  1,000 mcg Oral Daily  . Vitamin D (Ergocalciferol)  50,000 Units Oral Weekly  Continuous Infusions: . sodium chloride      Assessment/Plan:  1. Atrial fibrillation with rapid ventricular response.  Last heart rate likely taken off the blood pressure cuff rather than the monitor so it is lower than what it really is.  Heart rate on the monitor is in the 90s and 100s.  Patient on amiodarone, low-dose digoxin and low-dose Toprol-XL. 2. Hypotension.  Increase midodrine to 5 mg 3 times daily 3. Acute on chronic diastolic congestive heart failure with severe mitral regurgitation.  Continue Lasix 20 mg daily.  Patient not a surgical candidate.  Appreciate palliative care consultation. 4. Acute kidney injury on chronic kidney disease stage II.  Patient's creatinine has improved from 1.56 down to 1.16. 5. Severe protein calorie malnutrition 6. Seizure disorder on Keppra and valproic acid 7. Weakness.  Physical therapy reevaluation today to see how much she can walk.  Group health would like to take him home. 8. Hypertriglyceridemia on fenofibrate     Code Status:     Code Status Orders  (From admission, onward)         Start     Ordered   03/01/20 0739  Full code  Continuous        03/01/20 0739        Code Status History    This patient has a current code status but no historical code status.   Advance Care Planning Activity     Family Communication: Spoke with [name] at the group home Disposition Plan: Status is: Inpatient  Dispo: The patient is from: Group home              Anticipated d/c is to: Group home              Anticipated d/c date is: Potential 03/08/2020 versus 03/09/2020              Patient currently has a poor prognosis.  High risk for readmission.   Difficult balance between blood pressure and heart rate and heart failure.  Increasing midodrine today.  Consultants:  Cardiology  Time spent: 27 minutes  Samreet [place]

## 2020-03-07 NOTE — Progress Notes (Signed)
Able to feed patient about 60% food tray for breakfast. Alert and oriented to person, place and situation at the time. Crushed pills in applesauce. Patient has strong non productive cough without any expectorant.

## 2020-03-07 NOTE — Progress Notes (Signed)
Got in report  From day shift RN that pt pulled out IV and the MD is aware and okay with it. I notified NP on of pt code status being a full code with no IV access and that day shift MD was aware and okay with it.

## 2020-03-07 NOTE — Plan of Care (Signed)
  Problem: Education: Goal: Knowledge of General Education information will improve Description: Including pain rating scale, medication(s)/side effects and non-pharmacologic comfort measures Outcome: Progressing   Problem: Activity: Goal: Risk for activity intolerance will decrease Outcome: Progressing   Problem: Nutrition: Goal: Adequate nutrition will be maintained Outcome: Progressing Note: Pt drinking supplemental ensure

## 2020-03-08 DIAGNOSIS — N189 Chronic kidney disease, unspecified: Secondary | ICD-10-CM

## 2020-03-08 MED ORDER — AMIODARONE HCL 200 MG PO TABS: 200.0000 mg | ORAL_TABLET | Freq: Every day | ORAL | Status: DC

## 2020-03-08 MED ORDER — MIDODRINE HCL 5 MG PO TABS
10.0000 mg | ORAL_TABLET | Freq: Three times a day (TID) | ORAL | Status: DC
  Administered 2020-03-08 – 2020-03-10 (×5): 10 mg via ORAL
  Filled 2020-03-08 (×5): qty 2

## 2020-03-08 MED ORDER — AMIODARONE HCL 200 MG PO TABS
200.0000 mg | ORAL_TABLET | Freq: Two times a day (BID) | ORAL | Status: DC
  Administered 2020-03-08 – 2020-03-10 (×4): 200 mg via ORAL
  Filled 2020-03-08 (×4): qty 1

## 2020-03-08 NOTE — Progress Notes (Signed)
Patient ID: Raymond [name], male   DOB: 17-Aug-1960, 59 y.o.   MRN: [id number] [facility] PROGRESS NOTE  Raymond [name]:[id number] DOB: 04/21/1961 DOA: 02/29/2020 PCP: [name]  HPI/Subjective: Patient answers some yes or no questions.  Answers no to every question.  Initially came in with seizure.  Having difficult time balancing blood pressure and heart rate.  Objective: Vitals:   03/08/20 0841 03/08/20 1130  BP: 94/77 90/68  Pulse: 91 78  Resp: 16 18  Temp: 98.4 F (36.9 C) 98.3 F (36.8 C)  SpO2: 93% 96%    Intake/Output Summary (Last 24 hours) at 03/08/2020 1437 Last data filed at 03/08/2020 1345 Gross per 24 hour  Intake 477 ml  Output 750 ml  Net -273 ml   Filed Weights   03/06/20 0328 03/07/20 0513 03/08/20 0554  Weight: 55.7 kg 58.9 kg 58.3 kg    ROS: Review of Systems  Respiratory: Negative for shortness of breath.   Cardiovascular: Negative for chest pain.  Gastrointestinal: Negative for abdominal pain.   Exam: Physical Exam HENT:     Nose: No mucosal edema.     Mouth/Throat:     Pharynx: No oropharyngeal exudate.  Eyes:     General: Lids are normal.     Conjunctiva/sclera: Conjunctivae normal.     Pupils: Pupils are equal, round, and reactive to light.  Cardiovascular:     Rate and Rhythm: Normal rate and regular rhythm.     Heart sounds: S1 normal and S2 normal. Murmur heard.  Systolic murmur is present with a grade of 3/6.   Pulmonary:     Breath sounds: Examination of the right-lower field reveals decreased breath sounds. Examination of the left-lower field reveals decreased breath sounds. Decreased breath sounds present. No wheezing, rhonchi or rales.  Abdominal:     Palpations: Abdomen is soft.     Tenderness: There is no abdominal tenderness.  Musculoskeletal:     Right lower leg: No swelling.     Left lower leg: No swelling.  Skin:    General: Skin is warm.     Findings: No rash.  Neurological:     Mental  Status: He is alert.     Comments: Able to straight leg raise to command       Data Reviewed: Basic Metabolic Panel: Recent Labs  Lab 03/02/20 0617 03/02/20 0617 03/03/20 0729 03/04/20 0453 03/05/20 0454 03/06/20 0648 03/07/20 0602  NA 137   < > 138 140 138 136 137  K 4.2   < > 4.7 4.1 3.6 4.2 4.2  CL 105   < > 108 103 99 98 98  CO2 22   < > 21* 24 24 26 28   GLUCOSE 121*   < > 102* 108* 140* 129* 110*  BUN 18   < > 18 25* 37* 41* 40*  CREATININE 1.13   < > 1.29* 1.32* 1.56* 1.28* 1.16  CALCIUM 8.6*   < > 8.8* 9.0 9.4 9.4 8.9  MG 2.1  --   --   --   --   --   --    < > = values in this interval not displayed.   Liver Function Tests: Recent Labs  Lab 03/06/20 0648  AST 21  ALT 11  ALKPHOS 26*  BILITOT 1.4*  PROT 7.4  ALBUMIN 4.1   CBC: Recent Labs  Lab 03/02/20 0617 03/06/20 0648  WBC 6.1 9.6  NEUTROABS  --  5.7  HGB 10.3* 12.6*  HCT 31.1* 37.8*  MCV 96.6 94.5  PLT 141* 166   BNP (last 3 results) Recent Labs    03/01/20 0622  BNP 550.7*      Recent Results (from the past 240 hour(s))  SARS Coronavirus 2 by RT PCR (hospital order, performed in [facility] hospital lab) Nasopharyngeal Nasopharyngeal Swab     Status: None   Collection Time: 03/01/20  6:22 AM   Specimen: Nasopharyngeal Swab  Result Value Ref Range Status   SARS Coronavirus 2 NEGATIVE NEGATIVE Final    Comment: (NOTE) SARS-CoV-2 target nucleic acids are NOT DETECTED.  The SARS-CoV-2 RNA is generally detectable in upper and lower respiratory specimens during the acute phase of infection. The lowest concentration of SARS-CoV-2 viral copies this assay can detect is 250 copies / mL. A negative result does not preclude SARS-CoV-2 infection and should not be used as the sole basis for treatment or other patient management decisions.  A negative result may occur with improper specimen collection / handling, submission of specimen other than nasopharyngeal swab, presence of viral  mutation(s) within the areas targeted by this assay, and inadequate number of viral copies (<250 copies / mL). A negative result must be combined with clinical observations, patient history, and epidemiological information.  Fact Sheet for Patients:   BoilerBrush.com.cy  Fact Sheet for Healthcare Providers: https://pope.com/  This test is not yet approved or  cleared by the [place] FDA and has been authorized for detection and/or diagnosis of SARS-CoV-2 by FDA under an Emergency Use Authorization (EUA).  This EUA will remain in effect (meaning this test can be used) for the duration of the COVID-19 declaration under Section 564(b)(1) of the Act, 21 U.S.C. section 360bbb-3(b)(1), unless the authorization is terminated or revoked sooner.  Performed at [facility], [address] [place], [place] [address]   MRSA PCR Screening     Status: None   Collection Time: 03/01/20  6:36 PM   Specimen: Nasopharyngeal  Result Value Ref Range Status   MRSA by PCR NEGATIVE NEGATIVE Final    Comment:        The GeneXpert MRSA Assay (FDA approved for NASAL specimens only), is one component of a comprehensive MRSA colonization surveillance program. It is not intended to diagnose MRSA infection nor to guide or monitor treatment for MRSA infections. Performed at [facility], [address] [place], [place] [address]       Scheduled Meds: . amiodarone  200 mg Oral BID   Followed by  . [START ON 03/15/2020] amiodarone  200 mg Oral Daily  . amiodarone  400 mg Oral BID  . aspirin EC  81 mg Oral Daily  . cholecalciferol  2,000 Units Oral Daily  . digoxin  0.0625 mg Oral Daily  . divalproex  500 mg Oral TID WC  . enoxaparin (LOVENOX) injection  40 mg Subcutaneous Q24H  . famotidine  40 mg Oral QHS  . feeding supplement (ENSURE ENLIVE)  237 mL Oral TID BM  . fenofibrate  160 mg Oral Daily  . furosemide  20 mg Oral  Daily  . levETIRAcetam  1,500 mg Oral QPM   And  . levETIRAcetam  1,000 mg Oral Daily  . loratadine  10 mg Oral Daily  . midodrine  10 mg Oral TID WC  . multivitamin with minerals  1 tablet Oral Daily  . pantoprazole  40 mg Oral Daily  . psyllium  1 packet Oral Daily  . risperiDONE  1 mg Oral  Daily  . risperiDONE  2 mg Oral QHS  . sodium chloride flush  3 mL Intravenous Q12H  . vitamin B-12  1,000 mcg Oral Daily  . Vitamin D (Ergocalciferol)  50,000 Units Oral Weekly   Continuous Infusions: . sodium chloride      Assessment/Plan:  1. Atrial fibrillation with rapid ventricular response on presentation.  Heart rate trending better.  Had to get rid of Toprol-XL secondary to low blood pressure.  Continue amiodarone and low-dose digoxin. 2. Hypotension.  Increase midodrine to 10 mg 3 times daily 3. Acute on chronic diastolic congestive heart failure with severe mitral regurgitation.  Continue Lasix 20 mg daily.  Patient not a surgical candidate as per cardiology.  Appreciate palliative care consultation. 4. Acute kidney injury on chronic kidney disease stage II.  Creatinine improved down from 1.56 to 1.16. 5. Severe protein calorie malnutrition 6. Seizure disorder on Keppra and valproic acid. 7. Weakness.  Physical therapy today stated patient can walk but would benefit from rehab.  Patient unsteady with gait.  Group home owner Raymond [name] interested in looking into rehab options. 8. Hypertriglyceridemia on fenofibrate.    Code Status:     Code Status Orders  (From admission, onward)         Start     Ordered   03/01/20 0739  Full code  Continuous        03/01/20 0739        Code Status History    This patient has a current code status but no historical code status.   Advance Care Planning Activity     Family Communication: Spoke with [facility] group home owner. Disposition Plan: Status is: Inpatient  Dispo: The patient is from: Group home              Anticipated d/c is to:  Rehab versus group home              Anticipated d/c date is: Potential disposition 03/09/2020              Patient currently requiring midodrine to lift up blood pressure.  Increasing midodrine to 10 mg 3 times daily today.  Continue amiodarone and low-dose digoxin for rate control.  Low-dose Lasix for CHF.  Consultants:  Cardiology  Palliative care  Time spent: 28 minutes.  Case discussed with physical therapist, transitional care team and Raymond [name] [phone]) group home owner.  Raymond [name] [employer]  [facility] [employer]

## 2020-03-08 NOTE — Progress Notes (Signed)
Physical Therapy Treatment Patient Details Name: Raymond [name] MRN: [id number] DOB: October 13, 1960 Today's Date: 03/08/2020    History of Present Illness Patient is a 59 year old male brought in by EMS after he had a brief seizure was noted to be unresponsive. Rapid atrial fibrillation and hypotension requiring amiodarone, seizure, acute on chronic diastolic congestive heart failure with mitral regurgitation, Hypertriglyceridemia, GERD, weakness. History of mental retardation, epilepsy, obsessive compulsive disorder, and lives at a group home.     PT Comments    Pt was long sitting in bed upon arriving. Mostly non verbal but is able to respond to direct one answer questions. Agreeable to session and OOB activity. Required min assist to exit L side of bed, stand to RW and ambulate ~ 110 ft in room. Pt easily distracted and therapist had pt stay in room to limit these distractions. Pt is very unsteady with gait training. Per staff members at pt's group home, " He has been unsteady with walking for a long time but did not need to use RW." RN staff here in hospital states he has been incontinent and per staff at group home, was continent  prior to admission.  Pt is unsafe to ambulate without AD or assistance.Acute PT highly recommends DC to SNF to address deficits and improve safe functional mobility. Pt was repositioned in bed post session with call bell in reach and bed alarm in place. Therapist assisted pt with lunch tray set up for it was at EOB without pt aware.     Follow Up Recommendations  SNF     Equipment Recommendations  Rolling walker with 5" wheels    Recommendations for Other Services       Precautions / Restrictions Precautions Precautions: Fall Restrictions Weight Bearing Restrictions: No    Mobility  Bed Mobility Overal bed mobility: Needs Assistance Bed Mobility: Supine to Sit;Sit to Supine     Supine to sit: Min assist Sit to supine: Min guard   General bed  mobility comments: MIn assist to exit L side of bed with CGA to return after OOB activity  Transfers Overall transfer level: Needs assistance Equipment used: Rolling walker (2 wheeled) Transfers: Sit to/from Stand Sit to Stand: Min assist;From elevated surface         General transfer comment: Min assist to stand from slightly elevated bed height. Vcs for imporved technique and safety. pt does follow commands well with increased time.  Ambulation/Gait Ambulation/Gait assistance: Min assist;Min guard Gait Distance (Feet): 110 Feet Assistive device: Rolling walker (2 wheeled) Gait Pattern/deviations: Staggering left;Staggering right;Scissoring;Step-through pattern Gait velocity: decreased   General Gait Details: pt was able to ambulate ~ 110 ft with RW with unsteadiness. CGA throughout with occasional min assist to prevent fall.   Stairs             Wheelchair Mobility    Modified Rankin (Stroke Patients Only)       Balance Overall balance assessment: Needs assistance Sitting-balance support: Feet supported;Bilateral upper extremity supported Sitting balance-Leahy Scale: Fair     Standing balance support: Bilateral upper extremity supported Standing balance-Leahy Scale: Poor Standing balance comment: pt is high fall risk and will need assistance for safety with all OOB activity                            Cognition Arousal/Alertness: Awake/alert Behavior During Therapy: Flat affect Overall Cognitive Status: History of cognitive impairments - at baseline  General Comments: patient has history of mental retardation. patient is able to follow single step commands consistently. unable to discribe medical history/ PLOF      Exercises      General Comments        Pertinent Vitals/Pain Pain Assessment: No/denies pain    Home Living                      Prior Function            PT Goals  (current goals can now be found in the care plan section) Acute Rehab PT Goals Patient Stated Goal: none stated. pt only talks when asked direct questions Progress towards PT goals: Progressing toward goals    Frequency    Min 2X/week      PT Plan Discharge plan needs to be updated    Co-evaluation              AM-PAC PT "6 Clicks" Mobility   Outcome Measure  Help needed turning from your back to your side while in a flat bed without using bedrails?: A Little Help needed moving from lying on your back to sitting on the side of a flat bed without using bedrails?: A Little Help needed moving to and from a bed to a chair (including a wheelchair)?: A Lot Help needed standing up from a chair using your arms (e.g., wheelchair or bedside chair)?: A Lot Help needed to walk in hospital room?: A Lot Help needed climbing 3-5 steps with a railing? : Total 6 Click Score: 13    End of Session Equipment Utilized During Treatment: Gait belt;Oxygen Activity Tolerance: Patient tolerated treatment well Patient left: in bed;with call bell/phone within reach;with bed alarm set;with nursing/sitter in room Nurse Communication: Mobility status PT Visit Diagnosis: Muscle weakness (generalized) (M62.81);Unsteadiness on feet (R26.81)     Time: 1313-1350 PT Time Calculation (min) (ACUTE ONLY): 37 min  Charges:  $Gait Training: 8-22 mins $Therapeutic Activity: 8-22 mins                     Jetta Lout PTA 03/08/20, 3:43 PM

## 2020-03-08 NOTE — NC FL2 (Signed)
[place] MEDICAID FL2 LEVEL OF CARE SCREENING TOOL     IDENTIFICATION  Patient Name: Raymond [name] Birthdate: 14-Jan-1961 Sex: male Admission Date (Current Location): 02/29/2020  [place] and [place] Number:  Chiropodist and Address:  [facility], [address], [place], [place] [address]      Provider Number: [id number]  Attending Physician Name and Address:  Alford Highland, MD  Relative Name and Phone Number:       Current Level of Care: Hospital Recommended Level of Care: Skilled Nursing Facility Prior Approval Number:    Date Approved/Denied:   PASRR Number: pending  Discharge Plan: SNF    Current Diagnoses: Patient Active Problem List   Diagnosis Date Noted  . Protein-calorie malnutrition, severe 03/05/2020  . AKI (acute kidney injury) (HCC)   . Goals of care, counseling/discussion   . Palliative care by specialist   . DNR (do not resuscitate) discussion   . Acute on chronic diastolic (congestive) heart failure (HCC) 03/02/2020  . Hypotension   . Mitral valve insufficiency   . Acute on chronic diastolic CHF (congestive heart failure) (HCC) 03/01/2020  . HTN (hypertension) 03/01/2020  . GERD (gastroesophageal reflux disease) 03/01/2020  . New onset atrial fibrillation (HCC) 03/01/2020  . Atrial fibrillation with RVR (HCC) 03/01/2020  . Seizure disorder (HCC)   . Nose fracture 04/09/2016  . Weakness 07/11/2015  . Epileptic seizures (HCC) 01/12/2015  . Intellectual disability   . OCD (obsessive compulsive disorder)   . Hypertriglyceridemia   . Epilepsy (HCC)   . Systolic murmur     Orientation RESPIRATION BLADDER Height & Weight     Self  Normal External catheter, Incontinent (placed 8/5) Weight: 128 lb 9.6 oz (58.3 kg) Height:  5\' 8"  (172.7 cm)  BEHAVIORAL SYMPTOMS/MOOD NEUROLOGICAL BOWEL NUTRITION STATUS      Incontinent Diet (DYS 3 diet, thin liquids)  AMBULATORY STATUS COMMUNICATION OF NEEDS Skin   Limited  Assist Verbally Normal                       Personal Care Assistance Level of Assistance  Bathing, Feeding, Dressing Bathing Assistance: Limited assistance Feeding assistance: Independent Dressing Assistance: Limited assistance     Functional Limitations Info  Sight, Hearing, Speech Sight Info: Adequate Hearing Info: Adequate Speech Info: Adequate    SPECIAL CARE FACTORS FREQUENCY  PT (By licensed PT), OT (By licensed OT)     PT Frequency: 5x OT Frequency: 5x            Contractures Contractures Info: Not present    Additional Factors Info  Code Status, Allergies Code Status Info: Full Code Allergies Info: no known allergies           Current Medications (03/08/2020):  This is the current hospital active medication list Current Facility-Administered Medications  Medication Dose Route Frequency Provider Last Rate Last Admin  . 0.9 %  sodium chloride infusion  250 mL Intravenous PRN 05/08/2020, MD      . acetaminophen (TYLENOL) tablet 650 mg  650 mg Oral Q4H PRN Lorretta Harp, MD      . albuterol (PROVENTIL) (2.5 MG/3ML) 0.083% nebulizer solution 2.5 mg  2.5 mg Inhalation Q6H PRN Wieting, Richard, MD      . amiodarone (PACERONE) tablet 200 mg  200 mg Oral BID Lorretta Harp, MD       Followed by  . [START ON 03/15/2020] amiodarone (PACERONE) tablet 200 mg  200 mg Oral Daily Wieting,  Richard, MD      . amiodarone (PACERONE) tablet 400 mg  400 mg Oral BID Alford Highland, MD   400 mg at 03/08/20 0839  . aspirin EC tablet 81 mg  81 mg Oral Daily Lorretta Harp, MD   81 mg at 03/08/20 0839  . cholecalciferol (VITAMIN D) tablet 2,000 Units  2,000 Units Oral Daily Lorretta Harp, MD   2,000 Units at 03/08/20 [phone]  . digoxin (LANOXIN) tablet 0.0625 mg  0.0625 mg Oral Daily Alford Highland, MD   0.0625 mg at 03/08/20 0840  . divalproex (DEPAKOTE) DR tablet 500 mg  500 mg Oral TID WC Lorretta Harp, MD   500 mg at 03/08/20 1137  . enoxaparin (LOVENOX) injection 40 mg  40 mg  Subcutaneous Q24H Lorretta Harp, MD   40 mg at 03/08/20 0847  . famotidine (PEPCID) tablet 40 mg  40 mg Oral QHS Lorretta Harp, MD   40 mg at 03/07/20 2135  . feeding supplement (ENSURE ENLIVE) (ENSURE ENLIVE) liquid 237 mL  237 mL Oral TID BM Alford Highland, MD   237 mL at 03/08/20 0847  . fenofibrate tablet 160 mg  160 mg Oral Daily Lorretta Harp, MD   160 mg at 03/08/20 0840  . furosemide (LASIX) tablet 20 mg  20 mg Oral Daily Alford Highland, MD   20 mg at 03/08/20 0840  . guaiFENesin (MUCINEX) 12 hr tablet 600 mg  600 mg Oral BID PRN Lorretta Harp, MD      . levETIRAcetam (KEPPRA) tablet 1,500 mg  1,500 mg Oral QPM Erick Blinks, MD   1,500 mg at 03/07/20 1749   And  . levETIRAcetam (KEPPRA) tablet 1,000 mg  1,000 mg Oral Daily Erick Blinks, MD   1,000 mg at 03/08/20 0839  . loratadine (CLARITIN) tablet 10 mg  10 mg Oral Daily Lorretta Harp, MD   10 mg at 03/08/20 0839  . LORazepam (ATIVAN) injection 1 mg  1 mg Intravenous Q4H PRN Lorretta Harp, MD      . metoprolol tartrate (LOPRESSOR) injection 5 mg  5 mg Intravenous Q1H PRN Alford Highland, MD   5 mg at 03/06/20 1348  . midodrine (PROAMATINE) tablet 10 mg  10 mg Oral TID WC Wieting, Richard, MD      . multivitamin with minerals tablet 1 tablet  1 tablet Oral Daily Lorretta Harp, MD   1 tablet at 03/08/20 [phone]  . ondansetron (ZOFRAN) injection 4 mg  4 mg Intravenous Q6H PRN Lorretta Harp, MD      . pantoprazole (PROTONIX) EC tablet 40 mg  40 mg Oral Daily Lorretta Harp, MD   40 mg at 03/08/20 0840  . psyllium (HYDROCIL/METAMUCIL) packet 1 packet  1 packet Oral Daily Lorretta Harp, MD   1 packet at 03/08/20 0848  . risperiDONE (RISPERDAL) tablet 1 mg  1 mg Oral Daily Lorretta Harp, MD   1 mg at 03/08/20 0839  . risperiDONE (RISPERDAL) tablet 2 mg  2 mg Oral QHS Lorretta Harp, MD   2 mg at 03/07/20 2135  . sodium chloride flush (NS) 0.9 % injection 3 mL  3 mL Intravenous Q12H Lorretta Harp, MD   3 mL at 03/07/20 1057  . sodium chloride flush (NS) 0.9 % injection 3  mL  3 mL Intravenous PRN Lorretta Harp, MD      . vitamin B-12 (CYANOCOBALAMIN) tablet 1,000 mcg  1,000 mcg Oral Daily Lorretta Harp, MD   1,000 mcg at 03/08/20 0840  . Vitamin D (Ergocalciferol) (DRISDOL)  capsule 50,000 Units  50,000 Units Oral Weekly Lorretta Harp, MD   50,000 Units at 03/06/20 8099     Discharge Medications: Please see discharge summary for a list of discharge medications.  Relevant Imaging Results:  Relevant Lab Results:   Additional Information SSN: [id number]  Maree Krabbe, LCSW

## 2020-03-08 NOTE — Progress Notes (Signed)
Palliative: Raymond [name], Raymond [name], is resting quietly in bed.  He appears chronically ill and somewhat frail.  He will briefly make but not keep eye contact.  He is mostly nonverbal, but can answer simple yes and no answers.  I believe he is able to make his basic needs known when asked.  There is no family at bedside at this time.  I asked Raymond [name] if he would like something to drink and he replies in the affirmative.  He is able to take several sips from a straw without overt signs and symptoms of aspiration.  I asked if he would like to watch TV, and again he indicates that he would.  Raymond [name] likes to watch game shows.  I reassure him that we are caring for him, and that he will return to his group home [facility] as soon as possible.  Medically, we are still working for heart rate and blood pressure control.  Raymond [name] continues to have need of physical therapy assistance, and must be able to ambulate in order to return to his group home.  It may be that he would need short-term rehab for strength training before returning.  However, I feel like returning to his group home would be the best place for him psychologically and spiritually.  Conference with attending, bedside nursing staff, transition of care team related to patient condition, needs, goals of care, disposition.  Plan: Continue to treat the treatable.  Outpatient palliative medicine to follow.  Hopefully can return to group home and not need short-term rehab.  25 minutes Lillia Carmel, NP Palliative medicine team Team phone [phone] Greater than 50% of this time was spent counseling and coordinating care related to the above assessment and plan.

## 2020-03-08 NOTE — TOC Initial Note (Signed)
Transition of Care [facility]) - Initial/Assessment Note    Patient Details  Name: Raymond [name] MRN: [id number] Date of Birth: [birth year]  Transition of Care [facility]) CM/SW Contact:    Maree Krabbe, LCSW Phone Number: 03/08/2020, 2:44 PM  Clinical Narrative: CSW spoke with pt's caretaker at group home and he is agreeable for pt to d/c to SNF. Christen Bame would prefer pt go to [facility]. CSW has sent referral at this time. Pt's PASRR pending.                 Expected Discharge Plan: Skilled Nursing Facility Barriers to Discharge: Continued Medical Work up, English as a second language teacher   Patient Goals and CMS Choice Patient states their goals for this hospitalization and ongoing recovery are:: to get better   Choice offered to / list presented to : [facility] POA / Guardian  Expected Discharge Plan and Services Expected Discharge Plan: Skilled Nursing Facility In-house Referral: Clinical Social Work   Post Acute Care Choice: Skilled Nursing Facility Living arrangements for the past 2 months: Group Home                                      Prior Living Arrangements/Services Living arrangements for the past 2 months: Group Home Lives with:: Facility Resident Patient language and need for interpreter reviewed:: Yes Do you feel safe going back to the place where you live?: Yes      Need for Family Participation in Patient Care: Yes (Comment) Care giver support system in place?: Yes (comment)   Criminal Activity/Legal Involvement Pertinent to Current Situation/Hospitalization: No - Comment as needed  Activities of Daily Living   ADL Screening (condition at time of admission) Patient's cognitive ability adequate to safely complete daily activities?: No Is the patient deaf or have difficulty hearing?: No Does the patient have difficulty seeing, even when wearing glasses/contacts?: No Does the patient have difficulty concentrating, remembering, or making decisions?: No Patient able  to express need for assistance with ADLs?: No Does the patient have difficulty dressing or bathing?: Yes Independently performs ADLs?: No Communication: Dependent Is this a change from baseline?: Pre-admission baseline Dressing (OT): Needs assistance Is this a change from baseline?: Pre-admission baseline Grooming: Needs assistance Is this a change from baseline?: Pre-admission baseline Feeding: Needs assistance Is this a change from baseline?: Pre-admission baseline Bathing: Needs assistance Is this a change from baseline?: Pre-admission baseline Toileting: Needs assistance Is this a change from baseline?: Pre-admission baseline In/Out Bed: Needs assistance Is this a change from baseline?: Pre-admission baseline Walks in Home: Needs assistance Is this a change from baseline?: Pre-admission baseline Does the patient have difficulty walking or climbing stairs?: Yes Weakness of Legs: None Weakness of Arms/Hands: None  Permission Sought/Granted Permission sought to share information with : Family Supports    Share Information with NAME: Christen Bame  Permission granted to share info w AGENCY: Licensed conveyancer granted to share info w Relationship: Guardian     Emotional Assessment Appearance:: Appears stated age Attitude/Demeanor/Rapport: Unable to Assess Affect (typically observed): Unable to Assess Orientation: : Oriented to Self Alcohol / Substance Use: Not Applicable Psych Involvement: No (comment)  Admission diagnosis:  Sinus tachycardia [R00.0] Seizure (HCC) [R56.9] Acute pulmonary edema (HCC) [J81.0] Seizure disorder (HCC) [G40.909] Intellectual disability [F79] CHF exacerbation (HCC) [I50.9] Acute on chronic diastolic (congestive) heart failure (HCC) [I50.33] Atrial fibrillation with RVR (HCC) [I48.91] Patient Active Problem List  Diagnosis Date Noted  . Protein-calorie malnutrition, severe 03/05/2020  . AKI (acute kidney injury) (HCC)   . Goals of care,  counseling/discussion   . Palliative care by specialist   . DNR (do not resuscitate) discussion   . Acute on chronic diastolic (congestive) heart failure (HCC) 03/02/2020  . Hypotension   . Mitral valve insufficiency   . Acute on chronic diastolic CHF (congestive heart failure) (HCC) 03/01/2020  . HTN (hypertension) 03/01/2020  . GERD (gastroesophageal reflux disease) 03/01/2020  . New onset atrial fibrillation (HCC) 03/01/2020  . Atrial fibrillation with RVR (HCC) 03/01/2020  . Seizure disorder (HCC)   . Nose fracture 04/09/2016  . Weakness 07/11/2015  . Epileptic seizures (HCC) 01/12/2015  . Intellectual disability   . OCD (obsessive compulsive disorder)   . Hypertriglyceridemia   . Epilepsy (HCC)   . Systolic murmur    PCP:  [name] Pharmacy:   [facility] - [place], [place] - [address] [place] [address] Phone: [phone] Fax: [phone]     Social Determinants of Health (SDOH) Interventions    Readmission Risk Interventions No flowsheet data found.

## 2020-03-09 NOTE — Plan of Care (Signed)
  Problem: Clinical Measurements: Goal: Cardiovascular complication will be avoided Outcome: Progressing  Pt heart rate is controlled

## 2020-03-09 NOTE — Progress Notes (Signed)
PROGRESS NOTE    Raymond [name]  JAS:[id number] DOB: 07/05/1961 DOA: 02/29/2020 PCP: [name]  Assessment & Plan:   Principal Problem:   Acute on chronic diastolic CHF (congestive heart failure) (HCC) Active Problems:   Hypertriglyceridemia   Weakness   HTN (hypertension)   GERD (gastroesophageal reflux disease)   New onset atrial fibrillation (HCC)   Atrial fibrillation with RVR (HCC)   Seizure (HCC)   Acute on chronic diastolic (congestive) heart failure (HCC)   Hypotension   Mitral valve insufficiency   Goals of care, counseling/discussion   Palliative care by specialist   DNR (do not resuscitate) discussion   Protein-calorie malnutrition, severe   Acute kidney injury superimposed on CKD (HCC)   Atrial fibrillation: w/ RVR.Continue amiodarone, digoxin. Continue on tele  Hypotension: continue on midodrine. Keep MAP >65  Acute on chronic diastolic CHF: with severe mitral regurgitation.  Continue on lasix. Not a surgical candidate as per cardiology.  Palliative care will follow pt outpatient   AKI on CKDII:  Cr is trending down from day prior. Avoid nephrotoxic meds.  Severe protein calorie malnutrition: continue w/ nutrition supplements   Seizure disorder: continue on keppra and valproic acid.  Weakness: PT recs SNF  Hypertriglyceridemia: continue on fenofibrate.   DVT prophylaxis: lovenox Code Status: full  Family Communication:  Disposition Plan: likely to d/c to SNF  Status is: Inpatient  Remains inpatient appropriate because:Unsafe d/c plan   Dispo: The patient is from: Home              Anticipated d/c is to: SNF              Anticipated d/c date is: 2 days              Patient currently is medically stable to d/c.         Consultants:    Procedures:    Antimicrobials:    Subjective: Pt c/o fatigue   Objective: Vitals:   03/08/20 2038 03/09/20 0418 03/09/20 0716 03/09/20 1110  BP: 110/79 96/71 (!) 87/57 94/68    Pulse: 95 81 74 86  Resp: 14 20 17 17   Temp: 98.5 F (36.9 C) 97.6 F (36.4 C) 98.4 F (36.9 C) 98.2 F (36.8 C)  TempSrc: Oral  Axillary   SpO2: 95% 91% 93% 96%  Weight:  58.2 kg    Height:        Intake/Output Summary (Last 24 hours) at 03/09/2020 1235 Last data filed at 03/09/2020 1135 Gross per 24 hour  Intake 717 ml  Output 1100 ml  Net -383 ml   Filed Weights   03/07/20 0513 03/08/20 0554 03/09/20 0418  Weight: 58.9 kg 58.3 kg 58.2 kg    Examination:  General exam: Appears lethargic Respiratory system: Clear to auscultation. Respiratory effort normal. Cardiovascular system: S1 & S2 +. Norubs, gallops or clicks. No pedal edema. Gastrointestinal system: Abdomen is nondistended, soft and nontender. Hypoactive bowel sounds heard. Central nervous system: Lethargic. Moves all 4 extremities  Psychiatry: Judgement and insight appear abnormal. Flat mood and affect    Data Reviewed: I have personally reviewed following labs and imaging studies  CBC: Recent Labs  Lab 03/06/20 0648  WBC 9.6  NEUTROABS 5.7  HGB 12.6*  HCT 37.8*  MCV 94.5  PLT 166   Basic Metabolic Panel: Recent Labs  Lab 03/03/20 0729 03/04/20 0453 03/05/20 0454 03/06/20 0648 03/07/20 0602  NA 138 140 138 136 137  K 4.7 4.1 3.6 4.2 4.2  CL 108 103 99 98 98  CO2 21* 24 24 26 28   GLUCOSE 102* 108* 140* 129* 110*  BUN 18 25* 37* 41* 40*  CREATININE 1.29* 1.32* 1.56* 1.28* 1.16  CALCIUM 8.8* 9.0 9.4 9.4 8.9   GFR: Estimated Creatinine Clearance: 57.8 mL/min (by C-G formula based on SCr of 1.16 mg/dL). Liver Function Tests: Recent Labs  Lab 03/06/20 0648  AST 21  ALT 11  ALKPHOS 26*  BILITOT 1.4*  PROT 7.4  ALBUMIN 4.1   No results for input(s): LIPASE, AMYLASE in the last 168 hours. No results for input(s): AMMONIA in the last 168 hours. Coagulation Profile: No results for input(s): INR, PROTIME in the last 168 hours. Cardiac Enzymes: No results for input(s): CKTOTAL, CKMB,  CKMBINDEX, TROPONINI in the last 168 hours. BNP (last 3 results) No results for input(s): PROBNP in the last 8760 hours. HbA1C: No results for input(s): HGBA1C in the last 72 hours. CBG: No results for input(s): GLUCAP in the last 168 hours. Lipid Profile: No results for input(s): CHOL, HDL, LDLCALC, TRIG, CHOLHDL, LDLDIRECT in the last 72 hours. Thyroid Function Tests: No results for input(s): TSH, T4TOTAL, FREET4, T3FREE, THYROIDAB in the last 72 hours. Anemia Panel: No results for input(s): VITAMINB12, FOLATE, FERRITIN, TIBC, IRON, RETICCTPCT in the last 72 hours. Sepsis Labs: No results for input(s): PROCALCITON, LATICACIDVEN in the last 168 hours.  Recent Results (from the past 240 hour(s))  SARS Coronavirus 2 by RT PCR (hospital order, performed in [facility] hospital lab) Nasopharyngeal Nasopharyngeal Swab     Status: None   Collection Time: 03/01/20  6:22 AM   Specimen: Nasopharyngeal Swab  Result Value Ref Range Status   SARS Coronavirus 2 NEGATIVE NEGATIVE Final    Comment: (NOTE) SARS-CoV-2 target nucleic acids are NOT DETECTED.  The SARS-CoV-2 RNA is generally detectable in upper and lower respiratory specimens during the acute phase of infection. The lowest concentration of SARS-CoV-2 viral copies this assay can detect is 250 copies / mL. A negative result does not preclude SARS-CoV-2 infection and should not be used as the sole basis for treatment or other patient management decisions.  A negative result may occur with improper specimen collection / handling, submission of specimen other than nasopharyngeal swab, presence of viral mutation(s) within the areas targeted by this assay, and inadequate number of viral copies (<250 copies / mL). A negative result must be combined with clinical observations, patient history, and epidemiological information.  Fact Sheet for Patients:   05/01/20  Fact Sheet for Healthcare  Providers: BoilerBrush.com.cy  This test is not yet approved or  cleared by the https://pope.com/ FDA and has been authorized for detection and/or diagnosis of SARS-CoV-2 by FDA under an Emergency Use Authorization (EUA).  This EUA will remain in effect (meaning this test can be used) for the duration of the COVID-19 declaration under Section 564(b)(1) of the Act, 21 U.S.C. section 360bbb-3(b)(1), unless the authorization is terminated or revoked sooner.  Performed at [facility], [address] [place], [place]   MRSA PCR Screening     Status: None   Collection Time: 03/01/20  6:36 PM   Specimen: Nasopharyngeal  Result Value Ref Range Status   MRSA by PCR NEGATIVE NEGATIVE Final    Comment:        The GeneXpert MRSA Assay (FDA approved for NASAL specimens only), is one component of a comprehensive MRSA colonization surveillance program. It is not intended to diagnose MRSA infection nor to guide  or monitor treatment for MRSA infections. Performed at [facility], [address] [place], [place] [address]          Radiology Studies: No results found.      Scheduled Meds: . amiodarone  200 mg Oral BID   Followed by  . [START ON 03/15/2020] amiodarone  200 mg Oral Daily  . aspirin EC  81 mg Oral Daily  . cholecalciferol  2,000 Units Oral Daily  . digoxin  0.0625 mg Oral Daily  . divalproex  500 mg Oral TID WC  . enoxaparin (LOVENOX) injection  40 mg Subcutaneous Q24H  . famotidine  40 mg Oral QHS  . feeding supplement (ENSURE ENLIVE)  237 mL Oral TID BM  . fenofibrate  160 mg Oral Daily  . furosemide  20 mg Oral Daily  . levETIRAcetam  1,500 mg Oral QPM   And  . levETIRAcetam  1,000 mg Oral Daily  . loratadine  10 mg Oral Daily  . midodrine  10 mg Oral TID WC  . multivitamin with minerals  1 tablet Oral Daily  . pantoprazole  40 mg Oral Daily  . psyllium  1 packet Oral Daily  . risperiDONE  1 mg Oral  Daily  . risperiDONE  2 mg Oral QHS  . sodium chloride flush  3 mL Intravenous Q12H  . vitamin B-12  1,000 mcg Oral Daily  . Vitamin D (Ergocalciferol)  50,000 Units Oral Weekly   Continuous Infusions: . sodium chloride       LOS: 7 days    Time spent: 30 mins     Charise Killian, MD [facility] Pager [phone]  If 7PM-7AM, please contact night-coverage [email or web address] 03/09/2020, 12:35 PM

## 2020-03-09 NOTE — Progress Notes (Signed)
Palliative care at beside and called this RN into room. Helped change patient and move up in bed. Pillows placed bilaterally to support patient. Patient unable to answer questions but made eye contact and aware of presence in room. Palliative concerned about placement of patient into SNF with current status of patient declining.

## 2020-03-09 NOTE — Progress Notes (Signed)
[facility] Room 240 Civil engineer, contracting [facility]) Hospital Liaison RN note:  Received new referral for [facility] to follow at discharge from Lillia Carmel, NP. Patient information given to referral. I will follow for disposition.  Thank you for this referral.  Cyndra Numbers, RN [facility] Liaison [phone]

## 2020-03-09 NOTE — TOC Progression Note (Signed)
Transition of Care [facility]) - Progression Note    Patient Details  Name: Raymond [name] MRN: [id number] Date of Birth: [birth year]  Transition of Care [facility]) CM/SW Contact  Shawn Route, RN Phone Number: 03/09/2020, 1:40 PM  Clinical Narrative:     PASSR/[place] MUST requested more information for PASSR.  Information attached to case today.  Expected Discharge Plan: Skilled Nursing Facility Barriers to Discharge: Continued Medical Work up, English as a second language teacher  Expected Discharge Plan and Services Expected Discharge Plan: Skilled Nursing Facility In-house Referral: Clinical Social Work   Post Acute Care Choice: Skilled Nursing Facility Living arrangements for the past 2 months: Group Home                                       Social Determinants of Health (SDOH) Interventions    Readmission Risk Interventions No flowsheet data found.

## 2020-03-09 NOTE — Progress Notes (Addendum)
Palliative:  [name], [name], is lying quietly in bed.  He appears chronically ill and somewhat frail.  He will make and briefly keep eye contact.  He is somewhat nonverbal, but able answer yes and no simple questions.  I believe he is able to make his basic needs known if I asked.  There is no family at bedside at this time.  I asked, if he would like something special for lunch, he declines.  I ask if he would like ice cream, and again I get a weak response.  I share with him that I am going to call his caregiver Raymond [name].  He states, "you call Raymond [name]?",  And I reassure him that I will indeed call Raymond [name].  Raymond [name] has been in a loving group home for many years now.  There seems to be concerned about his ability to walk and function upon returning to group home.  I am very concerned about his emotional and psychological welfare transitioning to short-term rehab.  Call to longtime caregiver, Raymond [name].  We talk about disposition.  Raymond [name] states that she agrees the time he would likely do better returning to [facility] group home.  She states that if he cannot do well at the group home they can then set up different services.  Outpatient palliative services to follow to continue talking about the "what if's and maybe's".  Main caregiver Raymond [name] and Raymond [name] are both agreeable to DNR status.  Paperwork needs to be completed through DSS.  If outpatient palliative has questions, please reach out to me for assistance.  Conference with attending, bedside nursing staff, transition of care team, local hospice liaison related to patient condition, needs, disposition, outpatient palliative services, CODE STATUS.  Plan: Return home with treat the treatable care.  Outpatient palliative services to follow.  Please work with DSS Child psychotherapist for DNR paperwork.  45 minutes  Raymond Carmel, NP Palliative Medicine Team  Team phone [phone] Greater than 50% of this time was spent counseling and coordinating care related to  the above assessment and plan.

## 2020-03-09 NOTE — TOC Progression Note (Signed)
Transition of Care [facility]) - Progression Note    Patient Details  Name: Raymond [name] MRN: [id number] Date of Birth: 05/02/1961  [facility]) CM/SW Contact  Shawn Route, RN Phone Number: 03/09/2020, 2:42 PM  Clinical Narrative:    HH arranged with [facility] for PT and RN.   Expected Discharge Plan: Skilled Nursing Facility Barriers to Discharge: Continued Medical Work up, English as a second language teacher  Expected Discharge Plan and Services Expected Discharge Plan: Skilled Nursing Facility In-house Referral: Clinical Social Work   Post Acute Care Choice: Skilled Nursing Facility Living arrangements for the past 2 months: Group Home                           HH Arranged: PT, RN HH Agency: [facility] Date HH Agency Contacted: 03/09/20 Time HH Agency Contacted: 1442 Representative spoke with at [facility] Agency: Barbara Cower   Social Determinants of Health (SDOH) Interventions    Readmission Risk Interventions No flowsheet data found.

## 2020-03-10 ENCOUNTER — Encounter: Payer: Medicare Other | Admitting: Physical Therapy

## 2020-03-10 LAB — BASIC METABOLIC PANEL
Anion gap: 12 (ref 5–15)
BUN: 51 mg/dL — ABNORMAL HIGH (ref 6–20)
CO2: 29 mmol/L (ref 22–32)
Calcium: 9.2 mg/dL (ref 8.9–10.3)
Chloride: 98 mmol/L (ref 98–111)
Creatinine, Ser: 1.61 mg/dL — ABNORMAL HIGH (ref 0.61–1.24)
GFR calc Af Amer: 54 mL/min — ABNORMAL LOW (ref >60)
GFR calc non Af Amer: 47 mL/min — ABNORMAL LOW (ref >60)
Glucose, Bld: 104 mg/dL — ABNORMAL HIGH (ref 70–99)
Potassium: 3.7 mmol/L (ref 3.5–5.1)
Sodium: 139 mmol/L (ref 135–145)

## 2020-03-10 LAB — CBC
HCT: 37.5 % — ABNORMAL LOW (ref 39.0–52.0)
Hemoglobin: 12.4 g/dL — ABNORMAL LOW (ref 13.0–17.0)
MCH: 31.5 pg (ref 26.0–34.0)
MCHC: 33.1 g/dL (ref 30.0–36.0)
MCV: 95.2 fL (ref 80.0–100.0)
Platelets: 249 10*3/uL (ref 150–400)
RBC: 3.94 MIL/uL — ABNORMAL LOW (ref 4.22–5.81)
RDW: 14.6 % (ref 11.5–15.5)
WBC: 5.6 10*3/uL (ref 4.0–10.5)
nRBC: 0 % (ref 0.0–0.2)

## 2020-03-10 MED ORDER — DIGOXIN 62.5 MCG PO TABS: 0.0625 mg | ORAL_TABLET | Freq: Every day | ORAL | 0 refills | Status: AC

## 2020-03-10 MED ORDER — LEVETIRACETAM 1000 MG PO TABS: 1000.0000 mg | ORAL_TABLET | Freq: Every day | ORAL | 0 refills | Status: DC

## 2020-03-10 MED ORDER — AMIODARONE HCL 200 MG PO TABS: 200.0000 mg | ORAL_TABLET | Freq: Two times a day (BID) | ORAL | 0 refills | Status: DC

## 2020-03-10 MED ORDER — LEVETIRACETAM 750 MG PO TABS: 1500.0000 mg | ORAL_TABLET | Freq: Every evening | ORAL | 0 refills | Status: DC

## 2020-03-10 MED ORDER — SODIUM CHLORIDE 0.9 % IV SOLN: INTRAVENOUS | Status: DC

## 2020-03-10 MED ORDER — MIDODRINE HCL 10 MG PO TABS: 10.0000 mg | ORAL_TABLET | Freq: Three times a day (TID) | ORAL | 0 refills | Status: AC

## 2020-03-10 MED ORDER — AMIODARONE HCL 200 MG PO TABS: 200.0000 mg | ORAL_TABLET | Freq: Every day | ORAL | 0 refills | Status: AC

## 2020-03-10 NOTE — Progress Notes (Signed)
Mobility Specialist - Progress Note   03/10/20 1451  Mobility  Activity Refused mobility  Mobility performed by Mobility specialist     Pt refused mobility. No specific reason given. Will attempt mobility session again at a later date/time.   Tyanne Derocher Mobility Specialist  03/10/20, 2:53 PM

## 2020-03-10 NOTE — TOC Progression Note (Signed)
Transition of Care [facility]) - Progression Note    Patient Details  Name: Raymond [name] MRN: [id number] Date of Birth: [birth year]  Transition of Care [facility]) CM/SW Contact  Shawn Route, RN Phone Number: 03/10/2020, 8:42 AM  Clinical Narrative:     Left message with [name] caregiver, to verify patient may return to group home with [facility] PT and RN as well as palliative following in home.    Will continue to persue PASSR for SNF short stay rehab while waiting for return call.   Expected Discharge Plan: Skilled Nursing Facility Barriers to Discharge: Continued Medical Work up, English as a second language teacher  Expected Discharge Plan and Services Expected Discharge Plan: Skilled Nursing Facility In-house Referral: Clinical Social Work   Post Acute Care Choice: Skilled Nursing Facility Living arrangements for the past 2 months: Group Home                           HH Arranged: Charity fundraiser, PT, OT HH Agency: [facility] Date HH Agency Contacted: 03/09/20 Time HH Agency Contacted: 1442 Representative spoke with at [facility] Agency: Barbara Cower   Social Determinants of Health (SDOH) Interventions    Readmission Risk Interventions No flowsheet data found.

## 2020-03-10 NOTE — Care Management Important Message (Signed)
Important Message  Patient Details  Name: Raymond [name] MRN: [id number] Date of Birth: 08-20-1960   Medicare Important Message Given:  Yes  Reviewed with legal guardian, Lasha Hickman.  Copy of Medicare IM sent securely to email address provided: [email or web address].    [name] 03/10/2020, 1:31 PM

## 2020-03-10 NOTE — Discharge Summary (Signed)
Physician Discharge Summary  Raymond [name]:[id number] DOB: 10-04-1960 DOA: 02/29/2020  PCP: [name]  Admit date: 02/29/2020 Discharge date: 03/10/2020  Admitted From: home Disposition:  Home w/ home health   Recommendations for Outpatient Follow-up:  1. Follow up with PCP in 1-2 weeks 2. F/u w/ cardio in 1 week   Home Health: yes Equipment/Devices:  Discharge Condition: stable CODE STATUS: full  Diet recommendation: Heart Healthy   Brief/Interim Summary: HPI was taken from Dr. Clyde Lundborg: Raymond [name] is a 59 y.o. male with medical history significant of hypertension, mental retardation with aphasia, hypertension, hyperlipidemia, GERD, severe mitral valve regurgitation, dCHF, OCD, who presents with seizure.  Per caregiver, patient used to take carbamazepine and Depakote for seizure.  His carbamazepine has been tapering down and discontinued recently. He is started on Keppra 1000 mg twice a day.  Today patient was noted to have seizure-like activity and was in postictal status.  Patient was given 500 cc normal saline by EMS.  Initially patient was breathing normally.  After received another 1.5 L normal saline in ED, patient developed shortness breath and tachycardia.  She was found to have new onset atrial fibrillation with RVR in the ED.  Patient does not seem to have chest pain, no active cough.  No active nausea, vomiting, diarrhea noted.  Per caregiver, patient has a chronic fecal incontinence.  No fever or chills.  ED Course: pt was found to have WBC 6.4, subtherapeutic carbamazepine level (<2.0), initial valproate level is supratherapeutic at 134, but the repeated valproic level is therapeutic 82.  Pending Covid PCR.  Temperature normal.  Soft blood pressure, RR 26, oxygen saturation 97% on room air.  Chest x-ray showed cardiomegaly and interstitial pulmonary edema.  Patient is placed on progressive bed for observation   Hospital course from Dr. Shela Commons. Mayford Knife  8/11-8/12/21: Pt presented w/ a seizure. Pt's anti-epileptic meds were changed as per neuro recs. Pt was started on Keppra and taken off of carbamazepine. The pt did not have any seizure on 8/11 or 8/12. Furthermore, pt was found to have acute on chronic diastolic CHF exacerbation & was treated w/ lasix as tolerated. Of note, pt has hx of mitral reg but is not a surgical candidate as per cardio. Cardio did recommend palliative care see the pt. Palliative care saw the pt inpatient and recommended to continue to treat the problems that can be treated and palliative care will continue to follow the pt outpatient. Also, PT/OT saw the pt and recommended SNF. Pt the pt's caregiver as well as palliative care thought the pt would do better back at his group home w/ home health. Home health was set up by CM prior to d/c. For more information, please see previous progress notes.   Discharge Diagnoses:  Principal Problem:   Acute on chronic diastolic CHF (congestive heart failure) (HCC) Active Problems:   Hypertriglyceridemia   Weakness   HTN (hypertension)   GERD (gastroesophageal reflux disease)   New onset atrial fibrillation (HCC)   Atrial fibrillation with RVR (HCC)   Seizure (HCC)   Acute on chronic diastolic (congestive) heart failure (HCC)   Hypotension   Mitral valve insufficiency   Goals of care, counseling/discussion   Palliative care by specialist   DNR (do not resuscitate) discussion   Protein-calorie malnutrition, severe   Acute kidney injury superimposed on CKD (HCC)  Atrial fibrillation: w/ RVR.Continue amiodarone, digoxin. D/c metoprolol. Continue on tele  Hypotension: continue on midodrine. Keep MAP >65  Acute on  chronic diastolic CHF: with severe mitral regurgitation.  Continue on lasix. Not a surgical candidate as per cardiology.  Palliative care will follow pt outpatient   AKI on CKDII:  Cr is labile. Avoid nephrotoxic meds.  Severe protein calorie malnutrition: continue w/  nutrition supplements   Seizure disorder: continue on keppra and valproic acid.  Weakness: PT recs SNF  Hypertriglyceridemia: continue on fenofibrate.  Discharge Instructions  Discharge Instructions    Diet - low sodium heart healthy   Complete by: As directed    Discharge instructions   Complete by: As directed    F/u PCP in 1-2 weeks. F/u neuro in 1 week. F/u cardio, Dr. Lady Gary, in 2 weeks   Increase activity slowly   Complete by: As directed      Allergies as of 03/10/2020   No Known Allergies     Medication List    STOP taking these medications   carbamazepine 200 MG tablet Commonly known as: TEGRETOL   carvedilol 3.125 MG tablet Commonly known as: COREG     TAKE these medications   albuterol 108 (90 Base) MCG/ACT inhaler Commonly known as: VENTOLIN HFA Inhale 2 puffs into the lungs every 6 (six) hours as needed for wheezing or shortness of breath.   amiodarone 200 MG tablet Commonly known as: PACERONE Take 1 tablet (200 mg total) by mouth 2 (two) times daily for 5 days.   amiodarone 200 MG tablet Commonly known as: PACERONE Take 1 tablet (200 mg total) by mouth daily. Start taking this dosage on March 16, 2020 Start taking on: March 15, 2020   aspirin EC 81 MG tablet Take 81 mg by mouth daily.   BENEFIBER PO Take by mouth daily. 2 ? in 8 oz QD   Digoxin 62.5 MCG Tabs Take 0.0625 mg by mouth daily. Start taking on: March 11, 2020   divalproex 500 MG DR tablet Commonly known as: DEPAKOTE TAKE 1 TABLET BY MOUTH THREE TIMES DAILY WITH MEALS.   ENSURE PO Take 1 Can by mouth daily.   famotidine 40 MG tablet Commonly known as: PEPCID Take 40 mg by mouth at bedtime.   fenofibrate 145 MG tablet Commonly known as: TRICOR TAKE 1 TABLET BY MOUTH ONCE DAILY FOR LIPIDS.   fluticasone 50 MCG/ACT nasal spray Commonly known as: FLONASE Place 2 sprays into both nostrils daily. Use PRN   guaiFENesin 600 MG 12 hr tablet Commonly known as: MUCINEX Take 1  tablet (600 mg total) by mouth 2 (two) times daily as needed.   levETIRAcetam 750 MG tablet Commonly known as: KEPPRA Take 2 tablets (1,500 mg total) by mouth every evening.   levETIRAcetam 1000 MG tablet Commonly known as: KEPPRA Take 1 tablet (1,000 mg total) by mouth daily. Start taking on: March 11, 2020   loratadine 10 MG tablet Commonly known as: CLARITIN TAKE 1 TABLET BY MOUTH ONCE DAILY.   losartan 25 MG tablet Commonly known as: COZAAR Take 25 mg by mouth daily.   meclizine 25 MG tablet Commonly known as: ANTIVERT Take 25 mg by mouth 3 (three) times daily as needed for dizziness.   midodrine 10 MG tablet Commonly known as: PROAMATINE Take 1 tablet (10 mg total) by mouth 3 (three) times daily with meals.   multivitamin with minerals Tabs tablet Take 1 tablet by mouth daily.   omeprazole 40 MG capsule Commonly known as: PRILOSEC Take 40 mg by mouth daily.   risperiDONE 2 MG tablet Commonly known as: RISPERDAL Take 2 mg by  mouth at bedtime.   risperiDONE 1 MG tablet Commonly known as: RISPERDAL Take 1 mg by mouth daily.   VITAMIN B 12 PO Take 1,000 mcg by mouth daily.   Vitamin D 50 MCG (2000 UT) Caps Take 2,000 Units by mouth daily.   VITAMIN D2 PO Take 1.25 mg by mouth once a week.            Durable Medical Equipment  (From admission, onward)         Start     Ordered   03/10/20 0853  For home use only DME Walker rolling  Once       Question Answer Comment  Walker: With 5 Inch Wheels   Patient needs a walker to treat with the following condition Weakness      03/10/20 0853          No Known Allergies  Consultations:  Cardio   Palliative care   Procedures/Studies: DG Chest 2 View  Result Date: 03/01/2020 CLINICAL DATA:  Cough.  Tachycardia. EXAM: CHEST - 2 VIEW COMPARISON:  CT abdomen 10/07/2019.  Chest x-ray 07/22/2016. FINDINGS: Cardiomegaly with pulmonary venous congestion and bilateral interstitial prominence most  consistent interstitial edema. Pneumonitis cannot be excluded. No pleural effusion or pneumothorax. Pulmonary nodule identified on prior CT of 10/07/2019 best identified by prior CT. No acute bony abnormality. IMPRESSION: Cardiomegaly with pulmonary venous congestion and bilateral interstitial prominence most consistent with congestive heart failure with interstitial edema. No pleural effusion. Electronically Signed   By: Maisie Fus  Register   On: 03/01/2020 06:04   DG Chest Port 1 View  Result Date: 03/03/2020 CLINICAL DATA:  History of congestive heart failure and mitral regurgitation EXAM: PORTABLE CHEST 1 VIEW COMPARISON:  March 01, 2020 FINDINGS: There is cardiomegaly with pulmonary venous hypertension. There is a slight degree of interstitial edema. There is no airspace opacity. No adenopathy. No bone lesions. IMPRESSION: Cardiomegaly with pulmonary vascular congestion. Mild interstitial edema. Suspect a degree of underlying congestive heart failure. No consolidation. Electronically Signed   By: Bretta Bang III M.D.   On: 03/03/2020 12:19   ECHOCARDIOGRAM COMPLETE  Result Date: 03/01/2020    ECHOCARDIOGRAM REPORT   Patient Name:   RAMESES OU Date of Exam: 03/01/2020 Medical Rec #:  [id number]        Height:       68.0 in Accession #:    [id number]       Weight:       140.4 lb Date of Birth:  [birth year]       BSA:          1.759 m Patient Age:    58 years         BP:           103/79 mmHg Patient Gender: M                HR:           114 bpm. Exam Location:  [facility] Procedure: 2D Echo, Color Doppler and Cardiac Doppler Indications:     CHF- acute diastolic 428.31  History:         Patient has prior history of Echocardiogram examinations, most                  recent 10/29/2017. Signs/Symptoms:Murmur. Severe Mitral                  regurgitation.  Sonographer:     Cristela Blue RDCS (AE) Referring Phys:  [id number] Lorretta Harp Diagnosing Phys: Harold Hedge MD IMPRESSIONS  1. Left ventricular ejection fraction, by  estimation, is 40 to 45%. Left ventricular ejection fraction by 2D MOD biplane is 34.5 %. The left ventricle has mildly decreased function. The left ventricle demonstrates global hypokinesis. Left ventricular diastolic parameters were normal.  2. Right ventricular systolic function is normal. The right ventricular size is normal. There is normal pulmonary artery systolic pressure.  3. Left atrial size was moderately dilated.  4. Right atrial size was mildly dilated.  5. The mitral valve is abnormal. Moderate to severe mitral valve regurgitation.  6. The aortic valve was not well visualized. Aortic valve regurgitation is trivial. FINDINGS  Left Ventricle: Left ventricular ejection fraction, by estimation, is 40 to 45%. Left ventricular ejection fraction by 2D MOD biplane is 34.5 %. The left ventricle has mildly decreased function. The left ventricle demonstrates global hypokinesis. The left ventricular internal cavity size was normal in size. There is borderline left ventricular hypertrophy. Left ventricular diastolic parameters were normal. Right Ventricle: The right ventricular size is normal. No increase in right ventricular wall thickness. Right ventricular systolic function is normal. There is normal pulmonary artery systolic pressure. The tricuspid regurgitant velocity is 2.43 m/s, and  with an assumed right atrial pressure of 10 mmHg, the estimated right ventricular systolic pressure is 33.6 mmHg. Left Atrium: Left atrial size was moderately dilated. Right Atrium: Right atrial size was mildly dilated. Pericardium: There is no evidence of pericardial effusion. Mitral Valve: The mitral valve is abnormal. There is moderate prolapse of the medial scallop of the posterior leaflet of the mitral valve. Moderate to severe mitral valve regurgitation. MV peak gradient, 8.8 mmHg. The mean mitral valve gradient is 4.0 mmHg. Tricuspid Valve: The tricuspid valve is grossly normal. Tricuspid valve regurgitation is trivial.  Aortic Valve: The aortic valve was not well visualized. Aortic valve regurgitation is trivial. Aortic valve mean gradient measures 15.7 mmHg. Aortic valve peak gradient measures 24.5 mmHg. Aortic valve area, by VTI measures 0.73 cm. Pulmonic Valve: The pulmonic valve was not well visualized. Pulmonic valve regurgitation is trivial. Aorta: The aortic root is normal in size and structure. IAS/Shunts: The interatrial septum was not assessed.  LEFT VENTRICLE PLAX 2D                        Biplane EF (MOD) LVIDd:         6.07 cm         LV Biplane EF:   Left LVIDs:         5.56 cm                          ventricular LV PW:         1.66 cm                          ejection LV IVS:        0.96 cm                          fraction by LVOT diam:     2.30 cm                          2D MOD LV SV:         35  biplane is LV SV Index:   20                               34.5 %. LVOT Area:     4.15 cm                                Diastology                                LV e' lateral:   12.20 cm/s LV Volumes (MOD)               LV E/e' lateral: 8.4 LV vol d, MOD    133.0 ml      LV e' medial:    11.10 cm/s A2C:                           LV E/e' medial:  9.2 LV vol d, MOD    169.0 ml A4C: LV vol s, MOD    93.1 ml A2C: LV vol s, MOD    99.5 ml A4C: LV SV MOD A2C:   39.9 ml LV SV MOD A4C:   169.0 ml LV SV MOD BP:    53.6 ml RIGHT VENTRICLE RV S prime:     11.60 cm/s TAPSE (M-mode): 2.4 cm LEFT ATRIUM            Index       RIGHT ATRIUM           Index LA diam:      5.60 cm  3.18 cm/m  RA Area:     12.20 cm LA Vol (A2C): 98.9 ml  56.24 ml/m RA Volume:   26.50 ml  15.07 ml/m LA Vol (A4C): 173.0 ml 98.37 ml/m  AORTIC VALVE                    PULMONIC VALVE AV Area (Vmax):    0.73 cm     RVOT Peak grad: 3 mmHg AV Area (Vmean):   0.58 cm AV Area (VTI):     0.73 cm AV Vmax:           247.67 cm/s AV Vmean:          179.000 cm/s AV VTI:            0.485 m AV Peak Grad:      24.5 mmHg AV Mean Grad:       15.7 mmHg LVOT Vmax:         43.60 cm/s LVOT Vmean:        25.000 cm/s LVOT VTI:          0.085 m LVOT/AV VTI ratio: 0.17  AORTA Ao Root diam: 2.90 cm MITRAL VALVE                TRICUSPID VALVE MV Area (PHT): 4.36 cm     TR Peak grad:   23.6 mmHg MV Peak grad:  8.8 mmHg     TR Vmax:        243.00 cm/s MV Mean grad:  4.0 mmHg MV Vmax:       1.48 m/s     SHUNTS MV Vmean:      82.1 cm/s    Systemic VTI:  0.08 m MV Decel Time: 174 msec     Systemic Diam: 2.30 cm MV E velocity: 102.00 cm/s MV A velocity: 37.80 cm/s MV E/A ratio:  2.70 Harold Hedge MD Electronically signed by Harold Hedge MD Signature Date/Time: 03/01/2020/12:35:00 PM    Final        Subjective: Pt is only able to answer yes and no to some questions. Pt denies any pain.    Discharge Exam: Vitals:   03/10/20 0743 03/10/20 1105  BP: 97/65 94/66  Pulse: 84 80  Resp: 17 16  Temp: 97.8 F (36.6 C) 98 F (36.7 C)  SpO2: 90% 95%   Vitals:   03/10/20 0454 03/10/20 0511 03/10/20 0743 03/10/20 1105  BP: (!) 79/55 96/72 97/65  94/66  Pulse: 91  84 80  Resp: 20  17 16   Temp: 97.8 F (36.6 C) 98.8 F (37.1 C) 97.8 F (36.6 C) 98 F (36.7 C)  TempSrc: Oral Axillary Oral Oral  SpO2: 93%  90% 95%  Weight:      Height:        General: Pt is alert, awake, not in acute distress Cardiovascular: S1/S2 +, no rubs, no gallops Respiratory: CTA bilaterally, no wheezing, no rales Abdominal: Soft, NT, ND, bowel sounds + Extremities: no cyanosis    The results of significant diagnostics from this hospitalization (including imaging, microbiology, ancillary and laboratory) are listed below for reference.     Microbiology: Recent Results (from the past 240 hour(s))  SARS Coronavirus 2 by RT PCR (hospital order, performed in [facility] hospital lab) Nasopharyngeal Nasopharyngeal Swab     Status: None   Collection Time: 03/01/20  6:22 AM   Specimen: Nasopharyngeal Swab  Result Value Ref Range Status   SARS Coronavirus 2 NEGATIVE  NEGATIVE Final    Comment: (NOTE) SARS-CoV-2 target nucleic acids are NOT DETECTED.  The SARS-CoV-2 RNA is generally detectable in upper and lower respiratory specimens during the acute phase of infection. The lowest concentration of SARS-CoV-2 viral copies this assay can detect is 250 copies / mL. A negative result does not preclude SARS-CoV-2 infection and should not be used as the sole basis for treatment or other patient management decisions.  A negative result may occur with improper specimen collection / handling, submission of specimen other than nasopharyngeal swab, presence of viral mutation(s) within the areas targeted by this assay, and inadequate number of viral copies (<250 copies / mL). A negative result must be combined with clinical observations, patient history, and epidemiological information.  Fact Sheet for Patients:   [facility]  Fact Sheet for Healthcare Providers: 05/01/20  This test is not yet approved or  cleared by the [employer] FDA and has been authorized for detection and/or diagnosis of SARS-CoV-2 by FDA under an Emergency Use Authorization (EUA).  This EUA will remain in effect (meaning this test can be used) for the duration of the COVID-19 declaration under Section 564(b)(1) of the Act, 21 U.S.C. section 360bbb-3(b)(1), unless the authorization is terminated or revoked sooner.  Performed at [facility], [address] [place], [address] [place]   MRSA PCR Screening     Status: None   Collection Time: 03/01/20  6:36 PM   Specimen: Nasopharyngeal  Result Value Ref Range Status   MRSA by PCR NEGATIVE NEGATIVE Final    Comment:        The GeneXpert MRSA Assay (FDA approved for NASAL specimens only), is one component of a comprehensive MRSA colonization surveillance program. It is not  intended to diagnose MRSA infection nor to guide or monitor treatment for MRSA  infections. Performed at [facility], [address] [place], [place] [id number]      Labs: BNP (last 3 results) Recent Labs    03/01/20 0622  BNP 550.7*   Basic Metabolic Panel: Recent Labs  Lab 03/04/20 0453 03/05/20 0454 03/06/20 0648 03/07/20 0602 03/10/20 0523  NA 140 138 136 137 139  K 4.1 3.6 4.2 4.2 3.7  CL 103 99 98 98 98  CO2 24 24 26 28 29   GLUCOSE 108* 140* 129* 110* 104*  BUN 25* 37* 41* 40* 51*  CREATININE 1.32* 1.56* 1.28* 1.16 1.61*  CALCIUM 9.0 9.4 9.4 8.9 9.2   Liver Function Tests: Recent Labs  Lab 03/06/20 0648  AST 21  ALT 11  ALKPHOS 26*  BILITOT 1.4*  PROT 7.4  ALBUMIN 4.1   No results for input(s): LIPASE, AMYLASE in the last 168 hours. No results for input(s): AMMONIA in the last 168 hours. CBC: Recent Labs  Lab 03/06/20 0648 03/10/20 0523  WBC 9.6 5.6  NEUTROABS 5.7  --   HGB 12.6* 12.4*  HCT 37.8* 37.5*  MCV 94.5 95.2  PLT 166 249   Cardiac Enzymes: No results for input(s): CKTOTAL, CKMB, CKMBINDEX, TROPONINI in the last 168 hours. BNP: Invalid input(s): POCBNP CBG: No results for input(s): GLUCAP in the last 168 hours. D-Dimer No results for input(s): DDIMER in the last 72 hours. Hgb A1c No results for input(s): HGBA1C in the last 72 hours. Lipid Profile No results for input(s): CHOL, HDL, LDLCALC, TRIG, CHOLHDL, LDLDIRECT in the last 72 hours. Thyroid function studies No results for input(s): TSH, T4TOTAL, T3FREE, THYROIDAB in the last 72 hours.  Invalid input(s): FREET3 Anemia work up No results for input(s): VITAMINB12, FOLATE, FERRITIN, TIBC, IRON, RETICCTPCT in the last 72 hours. Urinalysis    Component Value Date/Time   COLORURINE YELLOW (A) 03/01/2020 0407   APPEARANCEUR CLEAR (A) 03/01/2020 0407   APPEARANCEUR Clear 12/19/2017 1424   LABSPEC 1.011 03/01/2020 0407   PHURINE 5.0 03/01/2020 0407   GLUCOSEU NEGATIVE 03/01/2020 0407   HGBUR NEGATIVE 03/01/2020 0407   BILIRUBINUR NEGATIVE  03/01/2020 0407   BILIRUBINUR Negative 12/19/2017 1424   KETONESUR NEGATIVE 03/01/2020 0407   PROTEINUR NEGATIVE 03/01/2020 0407   NITRITE NEGATIVE 03/01/2020 0407   LEUKOCYTESUR NEGATIVE 03/01/2020 0407   Sepsis Labs Invalid input(s): PROCALCITONIN,  WBC,  LACTICIDVEN Microbiology Recent Results (from the past 240 hour(s))  SARS Coronavirus 2 by RT PCR (hospital order, performed in [facility] hospital lab) Nasopharyngeal Nasopharyngeal Swab     Status: None   Collection Time: 03/01/20  6:22 AM   Specimen: Nasopharyngeal Swab  Result Value Ref Range Status   SARS Coronavirus 2 NEGATIVE NEGATIVE Final    Comment: (NOTE) SARS-CoV-2 target nucleic acids are NOT DETECTED.  The SARS-CoV-2 RNA is generally detectable in upper and lower respiratory specimens during the acute phase of infection. The lowest concentration of SARS-CoV-2 viral copies this assay can detect is 250 copies / mL. A negative result does not preclude SARS-CoV-2 infection and should not be used as the sole basis for treatment or other patient management decisions.  A negative result may occur with improper specimen collection / handling, submission of specimen other than nasopharyngeal swab, presence of viral mutation(s) within the areas targeted by this assay, and inadequate number of viral copies (<250 copies / mL). A negative result must be combined with clinical observations, patient history, and  epidemiological information.  Fact Sheet for Patients:   [employer]  Fact Sheet for Healthcare Providers: https://pope.com/  This test is not yet approved or  cleared by the [place] FDA and has been authorized for detection and/or diagnosis of SARS-CoV-2 by FDA under an Emergency Use Authorization (EUA).  This EUA will remain in effect (meaning this test can be used) for the duration of the COVID-19 declaration under Section 564(b)(1) of the Act, 21  U.S.C. section 360bbb-3(b)(1), unless the authorization is terminated or revoked sooner.  Performed at [facility], [address] [place], [place] [address]   MRSA PCR Screening     Status: None   Collection Time: 03/01/20  6:36 PM   Specimen: Nasopharyngeal  Result Value Ref Range Status   MRSA by PCR NEGATIVE NEGATIVE Final    Comment:        The GeneXpert MRSA Assay (FDA approved for NASAL specimens only), is one component of a comprehensive MRSA colonization surveillance program. It is not intended to diagnose MRSA infection nor to guide or monitor treatment for MRSA infections. Performed at [facility], [address] [place], [place] [address]      Time coordinating discharge: Over 30 minutes  SIGNED:   Charise Killian, MD  [facility] 03/10/2020, 1:32 PM Pager   If 7PM-7AM, please contact night-coverage [email or web address]

## 2020-03-10 NOTE — Plan of Care (Signed)
  Problem: Education: Goal: Knowledge of General Education information will improve Description: Including pain rating scale, medication(s)/side effects and non-pharmacologic comfort measures Outcome: Completed/Met   Problem: Health Behavior/Discharge Planning: Goal: Ability to manage health-related needs will improve Outcome: Completed/Met   Problem: Clinical Measurements: Goal: Ability to maintain clinical measurements within normal limits will improve Outcome: Completed/Met Goal: Will remain free from infection Outcome: Completed/Met Goal: Diagnostic test results will improve Outcome: Completed/Met Goal: Respiratory complications will improve Outcome: Completed/Met Goal: Cardiovascular complication will be avoided Outcome: Completed/Met   Problem: Activity: Goal: Risk for activity intolerance will decrease Outcome: Completed/Met   Problem: Nutrition: Goal: Adequate nutrition will be maintained Outcome: Completed/Met   Problem: Coping: Goal: Level of anxiety will decrease Outcome: Completed/Met   Problem: Elimination: Goal: Will not experience complications related to bowel motility Outcome: Completed/Met Goal: Will not experience complications related to urinary retention Outcome: Completed/Met   Problem: Pain Managment: Goal: General experience of comfort will improve Outcome: Completed/Met   Problem: Safety: Goal: Ability to remain free from injury will improve Outcome: Completed/Met   Problem: Skin Integrity: Goal: Risk for impaired skin integrity will decrease Outcome: Completed/Met   Problem: Education: Goal: Ability to manage disease process will improve Outcome: Completed/Met Goal: Individualized Educational Video(s) Outcome: Completed/Met   Problem: Cardiac: Goal: Ability to achieve and maintain adequate cardiopulmonary perfusion will improve Outcome: Completed/Met   

## 2020-03-10 NOTE — Discharge Instructions (Signed)
Intellectual Disability Intellectual disability (ID) is a defect in a person's mental abilities and behavior. It begins before age 58 and lasts for a lifetime. Intellectual disability used to be called mental retardation. That term is no longer used. Having an intellectual disability means that a person is limited in the ability to:  Learn new skills, solve problems, and make certain decisions (intellectual functioning).  Function well in daily life (adaptive functioning). People with an ID may have limited physical abilities (developmental disability). ID may also occur with many other conditions, including Down syndrome, cerebral palsy, epilepsy, attention-deficit hyperactivity disorder (ADHD), autism, depression, and anxiety. ID can range from mild to severe. It is more common in boys than in girls. Most children with ID can learn to do many things, though it may take them longer to do so. Adults with ID can lead partially independent lives. What are the causes? There are many possible causes of this condition. It can develop:  Before birth. Causes can include: ? Changes in the genes (genetic mutations). ? Problems during pregnancy, like the mother's illness or drug use. ? Exposure of the mother to certain poisons (toxins).  During labor and delivery. This can be caused by: ? Lack of oxygen. ? Infection. ? Failure to diagnose fetal or maternal distress. These are problems that occur in the baby or mother during delivery. ? Umbilical cord problems.  Before age 58. This can be caused by: ? Head injury or stroke. ? Infections, such as meningitis, whooping cough (pertussis), and measles. ? Exposure to toxins, such as mercury or lead. In some cases, the cause of this condition is not known. What are the signs or symptoms? Signs and symptoms of ID can vary from person to person and may depend on the age of the child. Some symptoms may not be seen until the child is older.  For babies,  symptoms may include delays in their ability to sit, crawl, walk, or talk.  For older children, symptoms may include: ? Inability to dress, eat, or do other activities or chores of daily life. ? Problems at school or work. ? Difficulty solving problems, controlling behavior, or maintaining social relationships. ? Difficulty remembering and following rules or understanding the results of their actions or decisions. How is this diagnosed? ID is diagnosed by health care providers who specialize in intellectual disability. The health care provider will:  Talk to the parents, other family members, and the child. If the child is school age, the health care provider may also get reports from teachers.  Give exams and tests. This may include a test to measure how well the child thinks and solves problems (IQ test). An IQ score of 70 to 75 may indicate an intellectual disability. Other tests may be given to confirm the diagnosis. These may include:  Tests in language, reading, writing, math, reasoning, or memory.  Tests to check social skills, such as judgment, independence, relationships, ability to follow rules, and ability to control behavior.  Tests to check the child's ability to solve problems and do daily tasks of life. These include personal care, managing money, and job or school performance. How is this treated? There is no cure for this condition. Treatment for ID focuses on services and support to encourage independence. Early intervention to diagnose and treat the condition is guaranteed by federal law under the Individuals with Disabilities Education Act (IDEA). The level of services and support that are needed may depend on your child's symptoms and how severe  they are. Treatment works best when started early and continued throughout life. Treatments may include:  Infant and early childhood services.  Special education.  Job or school coaching.  Family support.  Vocational  training.  Day programs.  Care manager support services.  Occupational therapy.  Special housing options, such as group homes and special needs foster care. Follow these instructions at home:  Learn as much as you can about intellectual disability. Ask other family members such as aunts, uncles, siblings, or grandparents to take part in learning about this condition.  Give over-the-counter and prescription medicines only as told by your child's health care provider.  Find support from federal and community services.  As a caregiver, think about how you can meet the needs of your child. If you need support, try to find other resources that may better meet your child's needs.  Work with a developmental pediatrician to identify your child's needs and to plan the best treatment for his or her condition.  Keep all follow-up visits as told by your health care provider. This is important. Where to find more information  American Association on Intellectual and Developmental Disabilities: aaidd.org  Center for Parent Information & Resources: FailFactory.se  Family Voices: Call (506)872-8148 or visit familyvoices.org  Individuals with Disabilities Education Act: https://avila-olson.com/ Contact a health care provider if:  You have questions about intellectual disability.  You need more help or support to manage your child's condition.  You are concerned that your child may harm himself or herself or others.  You are feeling down and discouraged, and you feel that you need help yourself. Get help right away if:  Your child becomes aggressive or violent toward himself or herself, you, or others. If you ever feel like your child may hurt himself or herself or others, or shares thoughts about taking his or her own life, get help right away. You can go to your nearest emergency department or call:  Your local emergency services (911 in the [place]  A suicide crisis helpline, such as  the National Suicide Prevention Lifeline at 352-395-5775. This is open 24 hours a day. Summary  Intellectual disability (ID) is a defect in a person's mental abilities and behavior. It begins before age 19 and lasts for a lifetime.  There are many causes of ID. In some cases, the cause is not known.  There is no cure for this condition. Treatment for ID focuses on services and support to encourage independence.  There are resources to help you and your child. Use them if available.  Get help right away if you ever feel like your child may harm himself or herself or others. Go to the nearest emergency department or call your local emergency services. This information is not intended to replace advice given to you by your health care provider. Make sure you discuss any questions you have with your health care provider. Document Revised: 08/07/2018 Document Reviewed: 08/07/2018 Elsevier Patient Education  2020 [employer]. Epilepsy Epilepsy is when a person keeps having seizures. A seizure is a burst of abnormal activity in the brain. A seizure can change how you think or behave, and it can make it hard to be aware of what is happening. This condition can cause problems such as:  Falls, accidents, and injury.  Sadness (depression).  Poor memory.  Sudden unexplained death in epilepsy (SUDEP). This is rare. Its cause is not known. Most people with epilepsy lead normal lives. What are the causes? This condition may be  caused by:  A head injury.  An injury that happens at birth.  A high fever during childhood.  A stroke.  Bleeding that goes into or around the brain.  Certain medicines and drugs.  Having too little oxygen for a long period of time.  Abnormal brain development.  Certain infections.  Brain tumors.  Conditions that are passed from parent to child (are hereditary). What are the signs or symptoms? Symptoms of a seizure vary from person to person. They may  include:  Jerky movements of muscles (convulsions).  Stiffening of the body.  Movements of the arms or legs that you are not able to control.  Passing out (loss of consciousness).  Breathing problems.  Sudden falls.  Confusion.  Head nodding.  Eye blinking or twitching.  Lip smacking.  Drooling.  Fast eye movements.  Grunting.  Not being able to control when you pee or poop.  Staring.  Being hard to wake up (unresponsiveness). Some people have symptoms right before a seizure happens (aura) and right after a seizure happens. These symptoms include:  Fear or anxiety.  Feeling sick to your stomach (nauseous).  Feeling like the room is spinning (vertigo).  A feeling of having seen or heard something before (dj vu).  Odd tastes or smells.  Changes in how you see (vision), such as seeing flashing lights or spots. Symptoms that follow a seizure include:  Being confused.  Being sleepy.  Having a headache. How is this treated? Treatment can control seizures. Treatment for this condition may involve:  Taking medicines to control seizures.  Having a device (vagus nerve stimulator) put in the chest. The device sends signals to a nerve and to the brain to prevent seizures.  Brain surgery to stop seizures from happening or to reduce how often they happen.  Having blood tests often to make sure you are getting the right amount of medicine. Once this condition has been diagnosed, it is important to start treatment as soon as possible. For some people, epilepsy goes away in time. Others will need treatment for the rest of their life. Follow these instructions at home: Medicines  Take over-the-counter and prescription medicines only as told by your doctor.  Avoid anything that may keep your medicine from working, such as alcohol. Activity  Get enough rest. Lack of sleep can make seizures more likely to occur.  Follow your doctor's advice about driving,  swimming, and doing anything else that would be dangerous if you had a seizure. ? If you live in the [place] check with your local DMV (department of motor vehicles) to find out about local driving laws. Each state has rules about when you can return to driving. Teaching others Teach friends and family what to do if you have a seizure. They should:  Lay you on the ground to prevent a fall.  Cushion your head and body.  Loosen any tight clothing around your neck.  Turn you on your side.  Stay with you until you are better.  Not hold you down.  Not put anything in your mouth.  Know whether or not you need emergency care.  General instructions  Avoid anything that causes you to have seizures.  Keep a seizure diary. Write down what you remember about each seizure. Be sure to include what might have caused it.  Keep all follow-up visits as told by your doctor. This is important. Contact a doctor if:  You have a change in how often or when you have  seizures.  You get an infection or start to feel sick. You may have more seizures when you are sick. Get help right away if:  A seizure does not stop after 5 minutes.  You have more than one seizure in a row, and you do not have enough time between the seizures to feel better.  A seizure makes it harder to breathe.  A seizure is different from other seizures you have had.  A seizure makes you unable to speak or use a part of your body.  You did not wake up right after a seizure. These symptoms may be an emergency. Do not wait to see if the symptoms will go away. Get medical help right away. Call your local emergency services (911 in the [place] Do not drive yourself to the hospital. Summary  Epilepsy is when a person keeps having seizures. A seizure is a burst of abnormal activity in the brain.  Treatment can control seizures.  Teach friends and family what to do if you have a seizure. This information is not intended to  replace advice given to you by your health care provider. Make sure you discuss any questions you have with your health care provider. Document Revised: 03/10/2018 Document Reviewed: 03/10/2018 Elsevier Patient Education  2020 [employer].

## 2020-03-10 NOTE — TOC Transition Note (Signed)
Transition of Care [facility]) - CM/SW Discharge Note   Patient Details  Name: Raymond [name] MRN: [id number] Date of Birth: 31-Mar-1961  [facility]) CM/SW Contact:  Shawn Route, RN Phone Number: 03/10/2020, 9:22 AM   Clinical Narrative:     Spoke with [name], patient will be able to go back to group home with [facility] service:  PT, RN and OT to be provided by [facility].  Outpatient Palliative Care to follow with [facility].  Gaynelle Adu stated to call him for discharge pickup.  Rolling Walker to be delivered to room by [employer]  Final next level of care: Group Home Barriers to Discharge: Barriers Resolved   Patient Goals and CMS Choice Patient states their goals for this hospitalization and ongoing recovery are:: to get better   Choice offered to / list presented to : [facility] POA / Guardian  Discharge Placement                  Name of family member notified: Christen Bame and Merril Abbe    Discharge Plan and Services In-house Referral: Clinical Social Work   Post Acute Care Choice: Skilled Nursing Facility          DME Arranged: Dan Humphreys rolling DME Agency: [employer] Date DME Agency Contacted: 03/10/20 Time DME Agency Contacted: [phone] Representative spoke with at DME Agency: Ian Malkin HH Arranged: RN, PT, OT [facility] Agency: [facility] Date HH Agency Contacted: 03/09/20 Time HH Agency Contacted: [phone] Representative spoke with at [facility] Agency: Barbara Cower  Social Determinants of Health (SDOH) Interventions     Readmission Risk Interventions No flowsheet data found.

## 2020-03-14 ENCOUNTER — Encounter: Payer: Medicare Other | Admitting: Physical Therapy

## 2020-03-17 ENCOUNTER — Encounter: Payer: Medicare Other | Admitting: Physical Therapy

## 2020-03-21 ENCOUNTER — Encounter: Payer: Medicare Other | Admitting: Physical Therapy

## 2020-03-22 ENCOUNTER — Other Ambulatory Visit: Payer: Self-pay

## 2020-03-22 ENCOUNTER — Emergency Department
Admission: EM | Admit: 2020-03-22 | Discharge: 2020-03-22 | Disposition: A | Discharge status: Home / Self Care | Payer: Medicare Other | Source: Home / Self Care | Attending: Emergency Medicine | Admitting: Emergency Medicine

## 2020-03-22 DIAGNOSIS — I130 Hypertensive heart and chronic kidney disease with heart failure and stage 1 through stage 4 chronic kidney disease, or unspecified chronic kidney disease: Secondary | ICD-10-CM | POA: Insufficient documentation

## 2020-03-22 DIAGNOSIS — I5033 Acute on chronic diastolic (congestive) heart failure: Secondary | ICD-10-CM | POA: Insufficient documentation

## 2020-03-22 DIAGNOSIS — Z20822 Contact with and (suspected) exposure to covid-19: Secondary | ICD-10-CM | POA: Insufficient documentation

## 2020-03-22 DIAGNOSIS — I9589 Other hypotension: Secondary | ICD-10-CM | POA: Diagnosis not present

## 2020-03-22 DIAGNOSIS — N189 Chronic kidney disease, unspecified: Secondary | ICD-10-CM | POA: Insufficient documentation

## 2020-03-22 DIAGNOSIS — R531 Weakness: Secondary | ICD-10-CM | POA: Diagnosis not present

## 2020-03-22 DIAGNOSIS — Z79899 Other long term (current) drug therapy: Secondary | ICD-10-CM | POA: Insufficient documentation

## 2020-03-22 DIAGNOSIS — Z7982 Long term (current) use of aspirin: Secondary | ICD-10-CM | POA: Insufficient documentation

## 2020-03-22 DIAGNOSIS — I959 Hypotension, unspecified: Secondary | ICD-10-CM | POA: Insufficient documentation

## 2020-03-22 LAB — BASIC METABOLIC PANEL
Anion gap: 11 (ref 5–15)
BUN: 13 mg/dL (ref 6–20)
CO2: 17 mmol/L — ABNORMAL LOW (ref 22–32)
Calcium: 8.1 mg/dL — ABNORMAL LOW (ref 8.9–10.3)
Chloride: 106 mmol/L (ref 98–111)
Creatinine, Ser: 1.22 mg/dL (ref 0.61–1.24)
GFR calc Af Amer: >60 mL/min (ref >60)
GFR calc non Af Amer: >60 mL/min (ref >60)
Glucose, Bld: 85 mg/dL (ref 70–99)
Potassium: 3.8 mmol/L (ref 3.5–5.1)
Sodium: 134 mmol/L — ABNORMAL LOW (ref 135–145)

## 2020-03-22 LAB — CBC
HCT: 32.8 % — ABNORMAL LOW (ref 39.0–52.0)
Hemoglobin: 10.6 g/dL — ABNORMAL LOW (ref 13.0–17.0)
MCH: 31.6 pg (ref 26.0–34.0)
MCHC: 32.3 g/dL (ref 30.0–36.0)
MCV: 97.9 fL (ref 80.0–100.0)
Platelets: 133 10*3/uL — ABNORMAL LOW (ref 150–400)
RBC: 3.35 MIL/uL — ABNORMAL LOW (ref 4.22–5.81)
RDW: 15.1 % (ref 11.5–15.5)
WBC: 4.6 10*3/uL (ref 4.0–10.5)
nRBC: 0 % (ref 0.0–0.2)

## 2020-03-22 LAB — SARS CORONAVIRUS 2 BY RT PCR (HOSPITAL ORDER, PERFORMED IN ~~LOC~~ HOSPITAL LAB): SARS Coronavirus 2: NEGATIVE

## 2020-03-22 LAB — VALPROIC ACID LEVEL: Valproic Acid Lvl: 22 ug/mL — ABNORMAL LOW (ref 50.0–100.0)

## 2020-03-22 LAB — URINALYSIS, COMPLETE (UACMP) WITH MICROSCOPIC
Bacteria, UA: NONE SEEN
Bilirubin Urine: NEGATIVE
Glucose, UA: NEGATIVE mg/dL
Hgb urine dipstick: NEGATIVE
Ketones, ur: NEGATIVE mg/dL
Leukocytes,Ua: NEGATIVE
Nitrite: NEGATIVE
Protein, ur: NEGATIVE mg/dL
Specific Gravity, Urine: 1.009 (ref 1.005–1.030)
Squamous Epithelial / HPF: NONE SEEN (ref 0–5)
pH: 5 (ref 5.0–8.0)

## 2020-03-22 LAB — TROPONIN I (HIGH SENSITIVITY)
Troponin I (High Sensitivity): 7 ng/L (ref <18)
Troponin I (High Sensitivity): 7 ng/L (ref <18)

## 2020-03-22 MED ORDER — SODIUM CHLORIDE 0.9 % IV BOLUS
1000.0000 mL | Freq: Once | INTRAVENOUS | Status: AC
  Administered 2020-03-22: 1000 mL via INTRAVENOUS

## 2020-03-22 NOTE — ED Notes (Signed)
Pt resting quietly, no distress noted at this time, cont to monitor 

## 2020-03-22 NOTE — ED Notes (Signed)
Pt moved into an exam room to get a urine sample, pt was able to use the urinal and brief removed, pt cleaned and new brief applied. Pt is more alert and less sleepy at this time and able to participate in his care

## 2020-03-22 NOTE — TOC Initial Note (Signed)
Transition of Care [facility]) - Initial/Assessment Note    Patient Details  Name: Raymond [name] MRN: [id number] Date of Birth: [birth year]  [facility]) CM/SW Contact:    Cementon Cellar, RN Phone Number: 03/22/2020, 3:07 PM  Clinical Narrative:                 Sherron Monday with [name], [phone] caregiver at [facility]. States patient has been eating very well since last hospital admission however has had increased weakness last 2 days. Had lost 8lbs since last admission.Patient currently receiving home health care and upon assessment today HHC nurse was unable to obtain BP and called EMS.         Patient Goals and CMS Choice        Expected Discharge Plan and Services                                                Prior Living Arrangements/Services                       Activities of Daily Living      Permission Sought/Granted                  Emotional Assessment              Admission diagnosis:  weakness syncopal ems Patient Active Problem List   Diagnosis Date Noted  . Protein-calorie malnutrition, severe 03/05/2020  . Acute kidney injury superimposed on CKD (HCC)   . Goals of care, counseling/discussion   . Palliative care by specialist   . DNR (do not resuscitate) discussion   . Acute on chronic diastolic (congestive) heart failure (HCC) 03/02/2020  . Hypotension   . Mitral valve insufficiency   . Acute on chronic diastolic CHF (congestive heart failure) (HCC) 03/01/2020  . HTN (hypertension) 03/01/2020  . GERD (gastroesophageal reflux disease) 03/01/2020  . New onset atrial fibrillation (HCC) 03/01/2020  . Atrial fibrillation with RVR (HCC) 03/01/2020  . Seizure (HCC)   . Nose fracture 04/09/2016  . Weakness 07/11/2015  . Epileptic seizures (HCC) 01/12/2015  . Intellectual disability   . OCD (obsessive compulsive disorder)   . Hypertriglyceridemia   . Epilepsy (HCC)   . Systolic murmur     PCP:  [name] Pharmacy:   [facility] [place], [place] - [address]  [place] [address] Phone: [phone] Fax: [phone]     Social Determinants of Health (SDOH) Interventions    Readmission Risk Interventions No flowsheet data found.

## 2020-03-22 NOTE — ED Notes (Signed)
Discharge instructions reviewed with [name] caregiver who came to pick patient up.

## 2020-03-22 NOTE — ED Provider Notes (Signed)
[facility] Emergency Department Provider Note  Time seen: 1:37 PM  I have reviewed the triage vital signs and the nursing notes.   HISTORY  Chief Complaint Weakness and Hypotension   HPI Raymond [name] is a 59 y.o. male with a past medical history of epilepsy, mental retardation, OCD, gastric reflux, presents to the emergency department for reported low blood pressure.  Patient blood pressure initially by EMS of around 80 systolic after nearly a liter of fluid now 104 systolic.  Here patient is quite somnolent, does awaken to voice but does not answer questions.  It is unclear what the patient's baseline.  Patient is unable to contribute to his history or why he is here.  Unable to obtain a review of systems.  Past Medical History:  Diagnosis Date  . Epilepsy (HCC)   . Epileptic seizures (HCC)   . Hyperlipidemia   . Mental retardation   . OCD (obsessive compulsive disorder)    water drinking  . OCD (obsessive compulsive disorder)   . Severe mitral regurgitation    a. 02/2016 Echo: EF 55-60%, no rwma, MVP of posterior leaflet w/ an anteriorly directed MR - severe. Sev dil LA; b. TTE 8/18: EF of 55-60%, normal wall motion, normal LV diastolic function parameters, severe mitral prolapse involving the anterior leaflet and posterior leaflet with severe regurgitation directed anteriorly, severely dilated left atrium, unable to estimate the PASP  . Systolic murmur     Patient Active Problem List   Diagnosis Date Noted  . Protein-calorie malnutrition, severe 03/05/2020  . Acute kidney injury superimposed on CKD (HCC)   . Goals of care, counseling/discussion   . Palliative care by specialist   . DNR (do not resuscitate) discussion   . Acute on chronic diastolic (congestive) heart failure (HCC) 03/02/2020  . Hypotension   . Mitral valve insufficiency   . Acute on chronic diastolic CHF (congestive heart failure) (HCC) 03/01/2020  . HTN (hypertension) 03/01/2020   . GERD (gastroesophageal reflux disease) 03/01/2020  . New onset atrial fibrillation (HCC) 03/01/2020  . Atrial fibrillation with RVR (HCC) 03/01/2020  . Seizure (HCC)   . Nose fracture 04/09/2016  . Weakness 07/11/2015  . Epileptic seizures (HCC) 01/12/2015  . Intellectual disability   . OCD (obsessive compulsive disorder)   . Hypertriglyceridemia   . Epilepsy (HCC)   . Systolic murmur     Past Surgical History:  Procedure Laterality Date  . COLONOSCOPY WITH PROPOFOL N/A 03/23/2019   Procedure: COLONOSCOPY WITH PROPOFOL;  Surgeon: Toledo, Boykin Nearing, MD;  Location: [facility] ENDOSCOPY;  Service: Gastroenterology;  Laterality: N/A;  . ESOPHAGOGASTRODUODENOSCOPY (EGD) WITH PROPOFOL N/A 03/23/2019   Procedure: ESOPHAGOGASTRODUODENOSCOPY (EGD) WITH PROPOFOL;  Surgeon: Toledo, Boykin Nearing, MD;  Location: [facility] ENDOSCOPY;  Service: Gastroenterology;  Laterality: N/A;  NEEDS A RAPID    Prior to Admission medications   Medication Sig Start Date End Date Taking? Authorizing Provider  albuterol (PROVENTIL HFA;VENTOLIN HFA) 108 (90 Base) MCG/ACT inhaler Inhale 2 puffs into the lungs every 6 (six) hours as needed for wheezing or shortness of breath. 07/26/16   Particia Nearing, PA-C  amiodarone (PACERONE) 200 MG tablet Take 1 tablet (200 mg total) by mouth 2 (two) times daily for 5 days. 03/10/20 03/15/20  Charise Killian, MD  amiodarone (PACERONE) 200 MG tablet Take 1 tablet (200 mg total) by mouth daily. Start taking this dosage on March 16, 2020 03/15/20 04/14/20  Charise Killian, MD  aspirin EC 81 MG tablet Take 81  mg by mouth daily.    [provider]  Cholecalciferol (VITAMIN D) 50 MCG (2000 UT) CAPS Take 2,000 Units by mouth daily.     [provider]  Cyanocobalamin (VITAMIN B 12 PO) Take 1,000 mcg by mouth daily.     [provider]  digoxin 62.5 MCG TABS Take 0.0625 mg by mouth daily. 03/11/20 04/10/20  Charise Killian, MD  divalproex (DEPAKOTE) 500 MG DR  tablet TAKE 1 TABLET BY MOUTH THREE TIMES DAILY WITH MEALS. 07/02/16   Steele Sizer, MD  Ergocalciferol (VITAMIN D2 PO) Take 1.25 mg by mouth once a week.    [provider]  famotidine (PEPCID) 40 MG tablet Take 40 mg by mouth at bedtime.    [provider]  fenofibrate (TRICOR) 145 MG tablet TAKE 1 TABLET BY MOUTH ONCE DAILY FOR LIPIDS. 01/02/17   Steele Sizer, MD  fluticasone (FLONASE) 50 MCG/ACT nasal spray Place 2 sprays into both nostrils daily. Use PRN 02/23/16   Steele Sizer, MD  guaiFENesin (MUCINEX) 600 MG 12 hr tablet Take 1 tablet (600 mg total) by mouth 2 (two) times daily as needed. 04/24/18   Johnson, Megan P, DO  levETIRAcetam (KEPPRA) 1000 MG tablet Take 1 tablet (1,000 mg total) by mouth daily. 03/11/20 04/10/20  Charise Killian, MD  levETIRAcetam (KEPPRA) 750 MG tablet Take 2 tablets (1,500 mg total) by mouth every evening. 03/10/20 04/09/20  Charise Killian, MD  loratadine (CLARITIN) 10 MG tablet TAKE 1 TABLET BY MOUTH ONCE DAILY. 04/02/17   Johnson, Megan P, DO  losartan (COZAAR) 25 MG tablet Take 25 mg by mouth daily.    [provider]  meclizine (ANTIVERT) 25 MG tablet Take 25 mg by mouth 3 (three) times daily as needed for dizziness.    [provider]  midodrine (PROAMATINE) 10 MG tablet Take 1 tablet (10 mg total) by mouth 3 (three) times daily with meals. 03/10/20 04/09/20  Charise Killian, MD  Multiple Vitamin (MULTIVITAMIN WITH MINERALS) TABS tablet Take 1 tablet by mouth daily.    [provider]  Nutritional Supplements (ENSURE PO) Take 1 Can by mouth daily.    [provider]  omeprazole (PRILOSEC) 40 MG capsule Take 40 mg by mouth daily.    [provider]  risperiDONE (RISPERDAL) 1 MG tablet Take 1 mg by mouth daily.  11/27/17   [provider]  risperiDONE (RISPERDAL) 2 MG tablet Take 2 mg by mouth at bedtime.  10/28/17   [provider]  Wheat Dextrin (BENEFIBER PO) Take by  mouth daily. 2 ? in 8 oz QD    [provider]    No Known Allergies  Family History  Family history unknown: Yes    Social History Social History   Tobacco Use  . Smoking status: Never Smoker  . Smokeless tobacco: Never Used  Vaping Use  . Vaping Use: Never used  Substance Use Topics  . Alcohol use: No    Alcohol/week: 0.0 standard drinks  . Drug use: No    Review of Systems Unable to obtain adequate/accurate review of systems secondary to mental status and baseline mental retardation.  ____________________________________________   PHYSICAL EXAM:  VITAL SIGNS: ED Triage Vitals  Enc Vitals Group     BP 03/22/20 1135 95/64     Pulse Rate 03/22/20 1135 81     Resp 03/22/20 1135 16     Temp 03/22/20 1135 98.4 F (36.9 C)  Temp Source 03/22/20 1135 Axillary     SpO2 03/22/20 1135 96 %     Weight 03/22/20 1136 128 lb 4.9 oz (58.2 kg)     Height 03/22/20 1136 6' (1.829 m)     Head Circumference --      Peak Flow --      Pain Score 03/22/20 1136 0     Pain Loc --      Pain Edu? --      Excl. in GC? --    Constitutional: Patient is quite somnolent but does awaken to voice.  Falls asleep if not engaged. Eyes: Normal exam ENT      Head: Normocephalic and atraumatic.      Mouth/Throat: Mucous membranes are moist. Cardiovascular: Normal rate, regular rhythm.  Respiratory: Normal respiratory effort without tachypnea nor retractions. Breath sounds are clear  Gastrointestinal: Soft and nontender. No distention.  Musculoskeletal: Nontender with normal range of motion in all extremities.  Neurologic: Somnolent but does awaken to voice.  Denies any pain.  Unable to obtain a more thorough neurologic exam. Skin:  Skin is warm, dry and intact.  Psychiatric: Calm. ____________________________________________    EKG  EKG viewed and interpreted by myself shows atrial fibrillation at 88 bpm, narrow QRS, normal axis, normal intervals, nonspecific ST  changes.  ____________________________________________   INITIAL IMPRESSION / ASSESSMENT AND PLAN / ED COURSE  Pertinent labs & imaging results that were available during my care of the patient were reviewed by me and considered in my medical decision making (see chart for details).   Patient presents emergency department for complaints of hypotension.  Here the patient is somewhat somnolent but does awaken to voice.  Does not really contribute to his history or answer questions or really follow commands.  Is unclear his baseline.  I reviewed old notes.  Patient's blood pressure appears to be consistent with his recent discharge 03/10/2020 in which blood pressures for the most part stayed in the mid 90s over the mid 60s.  We will check labs including a valproic acid level, cardiac enzymes and EKG.  Patient appears to be in no distress.  We will continue to closely monitor in the emergency department.  Raymond [name] was evaluated in Emergency Department on 03/22/2020 for the symptoms described in the history of present illness. He was evaluated in the context of the global COVID-19 pandemic, which necessitated consideration that the patient might be at risk for infection with the SARS-CoV-2 virus that causes COVID-19. Institutional protocols and algorithms that pertain to the evaluation of patients at risk for COVID-19 are in a state of rapid change based on information released by regulatory bodies including the CDC and federal and state organizations. These policies and algorithms were followed during the patient's care in the ED.  ____________________________________________   FINAL CLINICAL IMPRESSION(S) / ED DIAGNOSES  Hypotension Weakness   Minna Antis, MD 03/23/20 1353

## 2020-03-22 NOTE — ED Triage Notes (Signed)
Pt here via [facility] with hypotension. Pt denies any c/o

## 2020-03-22 NOTE — ED Triage Notes (Signed)
Pt in via EMS with c/o hypotension. Pt a-fib, hx of same. 78 BP by palp, 98%RA. 800cc's of fluids administered, BP now 104 palp. FSBS 80

## 2020-03-22 NOTE — Discharge Instructions (Addendum)
Please seek medical attention for any high fevers, chest pain, shortness of breath, change in behavior, persistent vomiting, bloody stool or any other new or concerning symptoms.  

## 2020-03-22 NOTE — ED Provider Notes (Signed)
Patient's urine with findings consistent with infection. Will plan on discharging back to facility.   Phineas Semen, MD 03/22/20 [phone]

## 2020-03-23 ENCOUNTER — Emergency Department: Payer: Medicare Other

## 2020-03-23 ENCOUNTER — Inpatient Hospital Stay
Admission: EM | Admit: 2020-03-23 | Discharge: 2020-03-29 | DRG: 314 | Disposition: A | Discharge status: Skilled Nursing Facility | Payer: Medicare Other | Attending: Internal Medicine | Admitting: Internal Medicine

## 2020-03-23 ENCOUNTER — Other Ambulatory Visit: Payer: Self-pay

## 2020-03-23 DIAGNOSIS — I482 Chronic atrial fibrillation, unspecified: Secondary | ICD-10-CM | POA: Diagnosis not present

## 2020-03-23 DIAGNOSIS — Z681 Body mass index (BMI) 19 or less, adult: Secondary | ICD-10-CM | POA: Diagnosis not present

## 2020-03-23 DIAGNOSIS — E785 Hyperlipidemia, unspecified: Secondary | ICD-10-CM | POA: Diagnosis present

## 2020-03-23 DIAGNOSIS — F79 Unspecified intellectual disabilities: Secondary | ICD-10-CM | POA: Diagnosis present

## 2020-03-23 DIAGNOSIS — E43 Unspecified severe protein-calorie malnutrition: Secondary | ICD-10-CM | POA: Diagnosis present

## 2020-03-23 DIAGNOSIS — I959 Hypotension, unspecified: Secondary | ICD-10-CM | POA: Diagnosis not present

## 2020-03-23 DIAGNOSIS — I110 Hypertensive heart disease with heart failure: Secondary | ICD-10-CM | POA: Diagnosis present

## 2020-03-23 DIAGNOSIS — E538 Deficiency of other specified B group vitamins: Secondary | ICD-10-CM | POA: Diagnosis present

## 2020-03-23 DIAGNOSIS — G40909 Epilepsy, unspecified, not intractable, without status epilepticus: Secondary | ICD-10-CM

## 2020-03-23 DIAGNOSIS — D696 Thrombocytopenia, unspecified: Secondary | ICD-10-CM | POA: Diagnosis present

## 2020-03-23 DIAGNOSIS — Z20822 Contact with and (suspected) exposure to covid-19: Secondary | ICD-10-CM | POA: Diagnosis present

## 2020-03-23 DIAGNOSIS — R0902 Hypoxemia: Secondary | ICD-10-CM

## 2020-03-23 DIAGNOSIS — F429 Obsessive-compulsive disorder, unspecified: Secondary | ICD-10-CM | POA: Diagnosis present

## 2020-03-23 DIAGNOSIS — I4819 Other persistent atrial fibrillation: Secondary | ICD-10-CM | POA: Diagnosis present

## 2020-03-23 DIAGNOSIS — I9589 Other hypotension: Secondary | ICD-10-CM | POA: Diagnosis present

## 2020-03-23 DIAGNOSIS — I5022 Chronic systolic (congestive) heart failure: Secondary | ICD-10-CM | POA: Diagnosis present

## 2020-03-23 DIAGNOSIS — D649 Anemia, unspecified: Secondary | ICD-10-CM | POA: Diagnosis present

## 2020-03-23 DIAGNOSIS — I4891 Unspecified atrial fibrillation: Secondary | ICD-10-CM | POA: Diagnosis present

## 2020-03-23 DIAGNOSIS — R531 Weakness: Secondary | ICD-10-CM

## 2020-03-23 DIAGNOSIS — I340 Nonrheumatic mitral (valve) insufficiency: Secondary | ICD-10-CM | POA: Diagnosis present

## 2020-03-23 DIAGNOSIS — G9341 Metabolic encephalopathy: Secondary | ICD-10-CM | POA: Diagnosis not present

## 2020-03-23 DIAGNOSIS — E0781 Sick-euthyroid syndrome: Secondary | ICD-10-CM | POA: Diagnosis present

## 2020-03-23 DIAGNOSIS — Z79899 Other long term (current) drug therapy: Secondary | ICD-10-CM

## 2020-03-23 DIAGNOSIS — R400 Somnolence: Secondary | ICD-10-CM | POA: Diagnosis present

## 2020-03-23 DIAGNOSIS — E7220 Disorder of urea cycle metabolism, unspecified: Secondary | ICD-10-CM | POA: Diagnosis not present

## 2020-03-23 DIAGNOSIS — I10 Essential (primary) hypertension: Secondary | ICD-10-CM | POA: Diagnosis not present

## 2020-03-23 DIAGNOSIS — Z7982 Long term (current) use of aspirin: Secondary | ICD-10-CM | POA: Diagnosis not present

## 2020-03-23 LAB — BASIC METABOLIC PANEL
Anion gap: 9 (ref 5–15)
BUN: 15 mg/dL (ref 6–20)
CO2: 22 mmol/L (ref 22–32)
Calcium: 8.7 mg/dL — ABNORMAL LOW (ref 8.9–10.3)
Chloride: 106 mmol/L (ref 98–111)
Creatinine, Ser: 1.24 mg/dL (ref 0.61–1.24)
GFR calc Af Amer: >60 mL/min (ref >60)
GFR calc non Af Amer: >60 mL/min (ref >60)
Glucose, Bld: 116 mg/dL — ABNORMAL HIGH (ref 70–99)
Potassium: 3.9 mmol/L (ref 3.5–5.1)
Sodium: 137 mmol/L (ref 135–145)

## 2020-03-23 LAB — BRAIN NATRIURETIC PEPTIDE: B Natriuretic Peptide: 414.7 pg/mL — ABNORMAL HIGH (ref 0.0–100.0)

## 2020-03-23 LAB — LACTIC ACID, PLASMA: Lactic Acid, Venous: 1.2 mmol/L (ref 0.5–1.9)

## 2020-03-23 LAB — TROPONIN I (HIGH SENSITIVITY)
Troponin I (High Sensitivity): 11 ng/L (ref <18)
Troponin I (High Sensitivity): 11 ng/L (ref <18)

## 2020-03-23 LAB — CBC
HCT: 36.1 % — ABNORMAL LOW (ref 39.0–52.0)
Hemoglobin: 11.9 g/dL — ABNORMAL LOW (ref 13.0–17.0)
MCH: 31.6 pg (ref 26.0–34.0)
MCHC: 33 g/dL (ref 30.0–36.0)
MCV: 96 fL (ref 80.0–100.0)
Platelets: 134 10*3/uL — ABNORMAL LOW (ref 150–400)
RBC: 3.76 MIL/uL — ABNORMAL LOW (ref 4.22–5.81)
RDW: 15.2 % (ref 11.5–15.5)
WBC: 5.2 10*3/uL (ref 4.0–10.5)
nRBC: 0 % (ref 0.0–0.2)

## 2020-03-23 LAB — DIGOXIN LEVEL: Digoxin Level: 0.4 ng/mL — ABNORMAL LOW (ref 0.8–2.0)

## 2020-03-23 LAB — AMMONIA: Ammonia: 34 umol/L (ref 9–35)

## 2020-03-23 MED ORDER — MIDODRINE HCL 5 MG PO TABS
10.0000 mg | ORAL_TABLET | Freq: Once | ORAL | Status: AC
  Administered 2020-03-23: 10 mg via ORAL
  Filled 2020-03-23: qty 2

## 2020-03-23 MED ORDER — AMIODARONE HCL 200 MG PO TABS
200.0000 mg | ORAL_TABLET | Freq: Once | ORAL | Status: AC
  Administered 2020-03-23: 200 mg via ORAL
  Filled 2020-03-23: qty 1

## 2020-03-23 NOTE — ED Notes (Signed)
Pt taken to CT.

## 2020-03-23 NOTE — ED Provider Notes (Signed)
[facility] Emergency Department Provider Note  ____________________________________________   First MD Initiated Contact with Patient 03/23/20 2136     (approximate)  I have reviewed the triage vital signs and the nursing notes.   HISTORY  Chief Complaint Weakness    HPI Raymond [name] is a 59 y.o. male  With extensive PMHx including MR, epilepsy, HTN, HLD, CHF, severe MR, here with weakness. Pt was just here yesterday for generalized weakness. At that time, he had a medical w/u which was reassuring and he was sent home. Returns today with reports of ongoing weakness, fatigue. Due to cognitive delay, unable to fully further elaborate. Denies any pain.   Level 5 caveat invoked as remainder of history, ROS, and physical exam limited due to patient's MR.  Past Medical History:  Diagnosis Date  . Epilepsy (HCC)   . Epileptic seizures (HCC)   . Hyperlipidemia   . Mental retardation   . OCD (obsessive compulsive disorder)    water drinking  . OCD (obsessive compulsive disorder)   . Severe mitral regurgitation    a. 02/2016 Echo: EF 55-60%, no rwma, MVP of posterior leaflet w/ an anteriorly directed MR - severe. Sev dil LA; b. TTE 8/18: EF of 55-60%, normal wall motion, normal LV diastolic function parameters, severe mitral prolapse involving the anterior leaflet and posterior leaflet with severe regurgitation directed anteriorly, severely dilated left atrium, unable to estimate the PASP  . Systolic murmur     Patient Active Problem List   Diagnosis Date Noted  . Protein-calorie malnutrition, severe 03/05/2020  . Acute kidney injury superimposed on CKD (HCC)   . Goals of care, counseling/discussion   . Palliative care by specialist   . DNR (do not resuscitate) discussion   . Acute on chronic diastolic (congestive) heart failure (HCC) 03/02/2020  . Hypotension   . Mitral valve insufficiency   . Acute on chronic diastolic CHF (congestive heart failure)  (HCC) 03/01/2020  . HTN (hypertension) 03/01/2020  . GERD (gastroesophageal reflux disease) 03/01/2020  . New onset atrial fibrillation (HCC) 03/01/2020  . Atrial fibrillation with RVR (HCC) 03/01/2020  . Seizure (HCC)   . Nose fracture 04/09/2016  . Weakness 07/11/2015  . Epileptic seizures (HCC) 01/12/2015  . Intellectual disability   . OCD (obsessive compulsive disorder)   . Hypertriglyceridemia   . Epilepsy (HCC)   . Systolic murmur     Past Surgical History:  Procedure Laterality Date  . COLONOSCOPY WITH PROPOFOL N/A 03/23/2019   Procedure: COLONOSCOPY WITH PROPOFOL;  Surgeon: Toledo, Boykin Nearing, MD;  Location: [facility] ENDOSCOPY;  Service: Gastroenterology;  Laterality: N/A;  . ESOPHAGOGASTRODUODENOSCOPY (EGD) WITH PROPOFOL N/A 03/23/2019   Procedure: ESOPHAGOGASTRODUODENOSCOPY (EGD) WITH PROPOFOL;  Surgeon: Toledo, Boykin Nearing, MD;  Location: [facility] ENDOSCOPY;  Service: Gastroenterology;  Laterality: N/A;  NEEDS A RAPID    Prior to Admission medications   Medication Sig Start Date End Date Taking? Authorizing Provider  albuterol (PROVENTIL HFA;VENTOLIN HFA) 108 (90 Base) MCG/ACT inhaler Inhale 2 puffs into the lungs every 6 (six) hours as needed for wheezing or shortness of breath. 07/26/16   Particia Nearing, PA-C  amiodarone (PACERONE) 200 MG tablet Take 1 tablet (200 mg total) by mouth 2 (two) times daily for 5 days. 03/10/20 03/15/20  Charise Killian, MD  amiodarone (PACERONE) 200 MG tablet Take 1 tablet (200 mg total) by mouth daily. Start taking this dosage on March 16, 2020 03/15/20 04/14/20  Charise Killian, MD  aspirin EC 81 MG  tablet Take 81 mg by mouth daily.    [provider]  Cholecalciferol (VITAMIN D) 50 MCG (2000 UT) CAPS Take 2,000 Units by mouth daily.     [provider]  Cyanocobalamin (VITAMIN B 12 PO) Take 1,000 mcg by mouth daily.     [provider]  digoxin 62.5 MCG TABS Take 0.0625 mg by mouth daily. 03/11/20 04/10/20   Charise Killian, MD  divalproex (DEPAKOTE) 500 MG DR tablet TAKE 1 TABLET BY MOUTH THREE TIMES DAILY WITH MEALS. 07/02/16   Steele Sizer, MD  Ergocalciferol (VITAMIN D2 PO) Take 1.25 mg by mouth once a week.    [provider]  famotidine (PEPCID) 40 MG tablet Take 40 mg by mouth at bedtime.    [provider]  fenofibrate (TRICOR) 145 MG tablet TAKE 1 TABLET BY MOUTH ONCE DAILY FOR LIPIDS. 01/02/17   Steele Sizer, MD  fluticasone (FLONASE) 50 MCG/ACT nasal spray Place 2 sprays into both nostrils daily. Use PRN 02/23/16   Steele Sizer, MD  guaiFENesin (MUCINEX) 600 MG 12 hr tablet Take 1 tablet (600 mg total) by mouth 2 (two) times daily as needed. 04/24/18   Johnson, Megan P, DO  levETIRAcetam (KEPPRA) 1000 MG tablet Take 1 tablet (1,000 mg total) by mouth daily. 03/11/20 04/10/20  Charise Killian, MD  levETIRAcetam (KEPPRA) 750 MG tablet Take 2 tablets (1,500 mg total) by mouth every evening. 03/10/20 04/09/20  Charise Killian, MD  loratadine (CLARITIN) 10 MG tablet TAKE 1 TABLET BY MOUTH ONCE DAILY. 04/02/17   Johnson, Megan P, DO  losartan (COZAAR) 25 MG tablet Take 25 mg by mouth daily.    [provider]  meclizine (ANTIVERT) 25 MG tablet Take 25 mg by mouth 3 (three) times daily as needed for dizziness.    [provider]  midodrine (PROAMATINE) 10 MG tablet Take 1 tablet (10 mg total) by mouth 3 (three) times daily with meals. 03/10/20 04/09/20  Charise Killian, MD  Multiple Vitamin (MULTIVITAMIN WITH MINERALS) TABS tablet Take 1 tablet by mouth daily.    [provider]  Nutritional Supplements (ENSURE PO) Take 1 Can by mouth daily.    [provider]  omeprazole (PRILOSEC) 40 MG capsule Take 40 mg by mouth daily.    [provider]  risperiDONE (RISPERDAL) 1 MG tablet Take 1 mg by mouth daily.  11/27/17   [provider]  risperiDONE (RISPERDAL) 2 MG tablet Take 2 mg by mouth at bedtime.  10/28/17    [provider]  Wheat Dextrin (BENEFIBER PO) Take by mouth daily. 2 ? in 8 oz QD    [provider]    Allergies Patient has no known allergies.  Family History  Family history unknown: Yes    Social History Social History   Tobacco Use  . Smoking status: Never Smoker  . Smokeless tobacco: Never Used  Vaping Use  . Vaping Use: Never used  Substance Use Topics  . Alcohol use: No    Alcohol/week: 0.0 standard drinks  . Drug use: No    Review of Systems  Review of Systems  Unable to perform ROS: Psychiatric disorder  Constitutional: Positive for fatigue.  Neurological: Positive for weakness.     ____________________________________________  PHYSICAL EXAM:      VITAL SIGNS: ED Triage Vitals  Enc Vitals Group     BP 03/23/20 1302 104/61     Pulse Rate 03/23/20 1302 (!) 102  Resp 03/23/20 1302 16     Temp 03/23/20 1302 97.9 F (36.6 C)     Temp Source 03/23/20 1302 Oral     SpO2 03/23/20 1302 95 %     Weight 03/23/20 1303 128 lb 4.9 oz (58.2 kg)     Height 03/23/20 1303 6' (1.829 m)     Head Circumference --      Peak Flow --      Pain Score 03/23/20 1303 0     Pain Loc --      Pain Edu? --      Excl. in GC? --      Physical Exam Vitals and nursing note reviewed.  Constitutional:      General: He is not in acute distress.    Appearance: He is well-developed.  HENT:     Head: Normocephalic and atraumatic.  Eyes:     Conjunctiva/sclera: Conjunctivae normal.  Cardiovascular:     Rate and Rhythm: Tachycardia present. Rhythm irregular.     Heart sounds: Murmur heard.  Systolic murmur is present with a grade of 5/6.  No friction rub.  Pulmonary:     Effort: Pulmonary effort is normal. Tachypnea present. No respiratory distress.     Breath sounds: Normal breath sounds. Transmitted upper airway sounds present. No wheezing or rales.  Abdominal:     General: There is no distension.     Palpations: Abdomen is soft.     Tenderness:  There is no abdominal tenderness.  Musculoskeletal:     Cervical back: Neck supple.  Skin:    General: Skin is warm.     Capillary Refill: Capillary refill takes less than 2 seconds.  Neurological:     Mental Status: He is alert and oriented to person, place, and time.     Motor: No abnormal muscle tone.     Comments: Flat facies and affect. MAE with 5/5 strength. Tongue protrusion midline.        ____________________________________________   LABS (all labs ordered are listed, but only abnormal results are displayed)  Labs Reviewed  BASIC METABOLIC PANEL - Abnormal; Notable for the following components:      Result Value   Glucose, Bld 116 (*)    Calcium 8.7 (*)    All other components within normal limits  CBC - Abnormal; Notable for the following components:   RBC 3.76 (*)    Hemoglobin 11.9 (*)    HCT 36.1 (*)    Platelets 134 (*)    All other components within normal limits  BRAIN NATRIURETIC PEPTIDE - Abnormal; Notable for the following components:   B Natriuretic Peptide 414.7 (*)    All other components within normal limits  DIGOXIN LEVEL - Abnormal; Notable for the following components:   Digoxin Level 0.4 (*)    All other components within normal limits  LACTIC ACID, PLASMA  AMMONIA  URINALYSIS, COMPLETE (UACMP) WITH MICROSCOPIC  LACTIC ACID, PLASMA  TROPONIN I (HIGH SENSITIVITY)  TROPONIN I (HIGH SENSITIVITY)  TROPONIN I (HIGH SENSITIVITY)    ____________________________________________  EKG: atrial fibrillation, VR 88. QRS 106. Non-specific St changes. No ST elevations. Normal axis. ________________________________________  RADIOLOGY All imaging, including plain films, CT scans, and ultrasounds, independently reviewed by me, and interpretations confirmed via formal radiology reads.  ED MD interpretation:   CXR: Clear  Official radiology report(s): DG Chest 2 View  Result Date: 03/23/2020 CLINICAL DATA:  Weakness EXAM: CHEST - 2 VIEW COMPARISON:   03/03/2020 FINDINGS: Cardiac shadow is enlarged  but stable. Aortic calcifications are again seen. Previously seen vascular congestion has resolved in the interval. No focal infiltrate or effusion is seen. No bony abnormality is noted. IMPRESSION: No acute abnormality seen. Electronically Signed   By: Alcide Clever M.D.   On: 03/23/2020 19:12   CT Head Wo Contrast  Result Date: 03/23/2020 CLINICAL DATA:  Delay area.  Weakness. EXAM: CT HEAD WITHOUT CONTRAST TECHNIQUE: Contiguous axial images were obtained from the base of the skull through the vertex without intravenous contrast. COMPARISON:  Brain MRI 10/08/2019 FINDINGS: Brain: No hemorrhage or evidence of acute ischemia. Mild generalized atrophy. There is ventricular dilatation which is stable from prior MRI, but greater than expected for age, likely due to central atrophy. No midline shift or mass effect. No extra-axial or subdural collection. Occasional basal gangliar mineralization typically senescent. Vascular: No hyperdense vessel. Skull: No fracture or focal lesion. Sinuses/Orbits: Paranasal sinuses and mastoid air cells are clear. The visualized orbits are unremarkable. Mild leftward nasal septal deviation. Other: None. IMPRESSION: 1. No acute intracranial abnormality. 2. Stable but age advanced atrophy from MRI in March. Electronically Signed   By: Narda Rutherford M.D.   On: 03/23/2020 22:42   CT Angio Chest PE W and/or Wo Contrast  Result Date: 03/24/2020 CLINICAL DATA:  Hypoxia EXAM: CT ANGIOGRAPHY CHEST WITH CONTRAST TECHNIQUE: Multidetector CT imaging of the chest was performed using the standard protocol during bolus administration of intravenous contrast. Multiplanar CT image reconstructions and MIPs were obtained to evaluate the vascular anatomy. CONTRAST:  75mL OMNIPAQUE IOHEXOL 350 MG/ML SOLN COMPARISON:  None. FINDINGS: Cardiovascular: There is excellent opacification of the central pulmonary arteries. No intraluminal filling defect to  suggest acute pulmonary embolism. The central pulmonary arteries are of normal caliber. There is moderate cardiomegaly with left ventricular and left atrial dilation. Mild coronary artery calcification. No pericardial effusion. The thoracic aorta is of normal caliber. Minimal atherosclerotic calcification within the thoracic aorta. Mediastinum/Nodes: No enlarged mediastinal, hilar, or axillary lymph nodes. Thyroid gland, trachea, and esophagus demonstrate no significant findings. Lungs/Pleura: Imaging is slightly limited by motion artifact. The lungs are clear. No pneumothorax or pleural effusion. Central airways are widely patent. Upper Abdomen: No acute abnormality. Musculoskeletal: No chest wall abnormality. No acute or significant osseous findings. Review of the MIP images confirms the above findings. IMPRESSION: 1. No evidence of acute pulmonary embolism. 2. Moderate cardiomegaly with left ventricular and left atrial dilation. 3. Lungs are clear. Aortic Atherosclerosis (ICD10-I70.0). Electronically Signed   By: Helyn Numbers MD   On: 03/24/2020 00:26    ____________________________________________  PROCEDURES   Procedure(s) performed (including Critical Care):  .1-3 Lead EKG Interpretation Performed by: Shaune Pollack, MD Authorized by: Shaune Pollack, MD     Interpretation: abnormal     ECG rate:  90-120   ECG rate assessment: tachycardic     Rhythm: atrial fibrillation     Ectopy: PVCs     Conduction: normal   Comments:     Indication: weakness, afib    ____________________________________________  INITIAL IMPRESSION / MDM / ASSESSMENT AND PLAN / ED COURSE  As part of my medical decision making, I reviewed the following data within the electronic MEDICAL RECORD NUMBER Nursing notes reviewed and incorporated, Old chart reviewed, Notes from prior ED visits, and [place] Controlled Substance Database       *Raymond [name] was evaluated in Emergency Department on 03/24/2020 for the  symptoms described in the history of present illness. He was evaluated in the context of the  global COVID-19 pandemic, which necessitated consideration that the patient might be at risk for infection with the SARS-CoV-2 virus that causes COVID-19. Institutional protocols and algorithms that pertain to the evaluation of patients at risk for COVID-19 are in a state of rapid change based on information released by regulatory bodies including the CDC and federal and state organizations. These policies and algorithms were followed during the patient's care in the ED.  Some ED evaluations and interventions may be delayed as a result of limited staffing during the pandemic.*     Medical Decision Making:  59 yo M here with generalized weakness. History somewhat limited 2/2 underlying cognitive issues, but suspect pt may have symptomatic CHF vs AFib RVR and underlying severe valvular disease. Labs obtained in triage and from recent visit reviewed - no significant abnormalities on CBC, electrolytes. EKG shows AFib. On tele, however, pt noted to intermittently go into AFib with occasional desats. He has some mild b/l edema. Will plan to ambulate, f/u remainder of labs.   ____________________________________________  FINAL CLINICAL IMPRESSION(S) / ED DIAGNOSES  Final diagnoses:  Generalized weakness  Hypoxia     MEDICATIONS GIVEN DURING THIS VISIT:  Medications  midodrine (PROAMATINE) tablet 10 mg (10 mg Oral Given 03/23/20 2300)  amiodarone (PACERONE) tablet 200 mg (200 mg Oral Given 03/23/20 2300)  iohexol (OMNIPAQUE) 350 MG/ML injection 75 mL (75 mLs Intravenous Contrast Given 03/24/20 0016)     ED Discharge Orders    None       Note:  This document was prepared using Dragon voice recognition software and may include unintentional dictation errors.   Shaune Pollack, MD 03/24/20 [phone]

## 2020-03-23 NOTE — ED Notes (Signed)
Pt ambulatory in room with this RN and Nicki [place] EDT. Pt with unsteady gait using walker. Pt ambualtory with pulse oximetry- o2 sats ranging from 88%-92% on RA.

## 2020-03-23 NOTE — H&P (Signed)
[place]   PATIENT NAME: Raymond [name]    MR#:  [id number]  DATE OF BIRTH:   18, 1962  DATE OF ADMISSION:  03/23/2020  PRIMARY CARE PHYSICIAN: [name]   REQUESTING/REFERRING PHYSICIAN: Alfonse Flavors, MD  CHIEF COMPLAINT:   Chief Complaint  Patient presents with  . Weakness    HISTORY OF PRESENT ILLNESS:  Raymond [name]  is a 59 y.o. Caucasian male with a known history of seizure disorder, mental retardation, obsessive-compulsive disorder and severe mitral regurgitation, presented to the emergency room with acute onset of generalized weakness.  The patient denied any dyspnea at rest, cough or wheezing.  No chest pain or palpitations.  No nausea or vomiting or abdominal pain.  No dysuria, oliguria or hematuria urgency or frequency or flank pain.  He was seen in the ER yesterday and work-up was reassuring therefore he was discharged.  Upon presentation to the emergency room, vital signs were within normal.  Pulse oximetry dropped when the patient stood up.  Labs revealed a creatinine of 1.24 post 2 previous levels with a BUN of 15 ammonia level of 34 and BNP 414 with high-sensitivity troponin I of 11 lactic acid 1.2.  CBC showed mild anemia better than previous levels.  Chest x-ray showed no acute cardiopulmonary disease.  EKG showed atrial fibrillation with controlled ventricular response of 88.  The patient was given 200 mg p.o. ibuprofen and 10 mg p.o. milligram for borderline blood pressure.  He will be admitted to the medical monitored bed for further evaluation and management. PAST MEDICAL HISTORY:   Past Medical History:  Diagnosis Date  . Epilepsy (HCC)   . Epileptic seizures (HCC)   . Hyperlipidemia   . Mental retardation   . OCD (obsessive compulsive disorder)    water drinking  . OCD (obsessive compulsive disorder)   . Severe mitral regurgitation    a. 02/2016 Echo: EF 55-60%, no rwma, MVP of posterior leaflet w/ an anteriorly directed MR -  severe. Sev dil LA; b. TTE 8/18: EF of 55-60%, normal wall motion, normal LV diastolic function parameters, severe mitral prolapse involving the anterior leaflet and posterior leaflet with severe regurgitation directed anteriorly, severely dilated left atrium, unable to estimate the PASP  . Systolic murmur     PAST SURGICAL HISTORY:   Past Surgical History:  Procedure Laterality Date  . COLONOSCOPY WITH PROPOFOL N/A 03/23/2019   Procedure: COLONOSCOPY WITH PROPOFOL;  Surgeon: Toledo, Boykin Nearing, MD;  Location: [facility];  Service: Gastroenterology;  Laterality: N/A;  . ESOPHAGOGASTRODUODENOSCOPY (EGD) WITH PROPOFOL N/A 03/23/2019   Procedure: ESOPHAGOGASTRODUODENOSCOPY (EGD) WITH PROPOFOL;  Surgeon: Toledo, Boykin Nearing, MD;  Location: [facility];  Service: Gastroenterology;  Laterality: N/A;  NEEDS A RAPID    SOCIAL HISTORY:   Social History   Tobacco Use  . Smoking status: Never Smoker  . Smokeless tobacco: Never Used  Substance Use Topics  . Alcohol use: No    Alcohol/week: 0.0 standard drinks    FAMILY HISTORY:   Family History  Family history unknown: Yes  He does not recall any familial diseases.  DRUG ALLERGIES:  No Known Allergies  REVIEW OF SYSTEMS:   ROS As per history of present illness. All pertinent systems were reviewed above. Constitutional, HEENT, cardiovascular, respiratory, GI, GU, musculoskeletal, neuro, psychiatric, endocrine, integumentary and hematologic systems were reviewed and are otherwise negative/unremarkable except for positive findings mentioned above in the HPI.   MEDICATIONS AT HOME:   Prior to Admission medications   Medication  Sig Start Date End Date Taking? Authorizing Provider  albuterol (PROVENTIL HFA;VENTOLIN HFA) 108 (90 Base) MCG/ACT inhaler Inhale 2 puffs into the lungs every 6 (six) hours as needed for wheezing or shortness of breath. 07/26/16   Particia Nearing, PA-C  amiodarone (PACERONE) 200 MG tablet Take 1 tablet  (200 mg total) by mouth 2 (two) times daily for 5 days. 03/10/20 03/15/20  Charise Killian, MD  amiodarone (PACERONE) 200 MG tablet Take 1 tablet (200 mg total) by mouth daily. Start taking this dosage on March 16, 2020 03/15/20 04/14/20  Charise Killian, MD  aspirin EC 81 MG tablet Take 81 mg by mouth daily.    [provider]  Cholecalciferol (VITAMIN D) 50 MCG (2000 UT) CAPS Take 2,000 Units by mouth daily.     [provider]  Cyanocobalamin (VITAMIN B 12 PO) Take 1,000 mcg by mouth daily.     [provider]  digoxin 62.5 MCG TABS Take 0.0625 mg by mouth daily. 03/11/20 04/10/20  Charise Killian, MD  divalproex (DEPAKOTE) 500 MG DR tablet TAKE 1 TABLET BY MOUTH THREE TIMES DAILY WITH MEALS. 07/02/16   Steele Sizer, MD  Ergocalciferol (VITAMIN D2 PO) Take 1.25 mg by mouth once a week.    [provider]  famotidine (PEPCID) 40 MG tablet Take 40 mg by mouth at bedtime.    [provider]  fenofibrate (TRICOR) 145 MG tablet TAKE 1 TABLET BY MOUTH ONCE DAILY FOR LIPIDS. 01/02/17   Steele Sizer, MD  fluticasone (FLONASE) 50 MCG/ACT nasal spray Place 2 sprays into both nostrils daily. Use PRN 02/23/16   Steele Sizer, MD  guaiFENesin (MUCINEX) 600 MG 12 hr tablet Take 1 tablet (600 mg total) by mouth 2 (two) times daily as needed. 04/24/18   Johnson, Megan P, DO  levETIRAcetam (KEPPRA) 1000 MG tablet Take 1 tablet (1,000 mg total) by mouth daily. 03/11/20 04/10/20  Charise Killian, MD  levETIRAcetam (KEPPRA) 750 MG tablet Take 2 tablets (1,500 mg total) by mouth every evening. 03/10/20 04/09/20  Charise Killian, MD  loratadine (CLARITIN) 10 MG tablet TAKE 1 TABLET BY MOUTH ONCE DAILY. 04/02/17   Johnson, Megan P, DO  losartan (COZAAR) 25 MG tablet Take 25 mg by mouth daily.    [provider]  meclizine (ANTIVERT) 25 MG tablet Take 25 mg by mouth 3 (three) times daily as needed for dizziness.    [provider]  midodrine  (PROAMATINE) 10 MG tablet Take 1 tablet (10 mg total) by mouth 3 (three) times daily with meals. 03/10/20 04/09/20  Charise Killian, MD  Multiple Vitamin (MULTIVITAMIN WITH MINERALS) TABS tablet Take 1 tablet by mouth daily.    [provider]  Nutritional Supplements (ENSURE PO) Take 1 Can by mouth daily.    [provider]  omeprazole (PRILOSEC) 40 MG capsule Take 40 mg by mouth daily.    [provider]  risperiDONE (RISPERDAL) 1 MG tablet Take 1 mg by mouth daily.  11/27/17   [provider]  risperiDONE (RISPERDAL) 2 MG tablet Take 2 mg by mouth at bedtime.  10/28/17   [provider]  Wheat Dextrin (BENEFIBER PO) Take by mouth daily. 2 ? in 8 oz QD    [provider]      VITAL SIGNS:  Blood pressure 114/88, pulse 89, temperature 97.8 F (36.6 C), temperature source Oral, resp. rate 19, height 6' (1.829 m), weight 58.2 kg, SpO2 98 %.  PHYSICAL EXAMINATION:  Physical Exam  GENERAL:  59 y.o.-year-old Caucasian male patient lying in the bed with mild conversational dyspnea. EYES: Pupils equal, round, reactive to light and accommodation. No scleral icterus. Extraocular muscles intact.  HEENT: Head atraumatic, normocephalic. Oropharynx and nasopharynx clear.  NECK:  Supple, no jugular venous distention. No thyroid enlargement, no tenderness.  LUNGS: Normal breath sounds bilaterally, no wheezing, rales,rhonchi or crepitation. No use of accessory muscles of respiration.  CARDIOVASCULAR: Regular rate and rhythm, S1, S2 normal. No murmurs, rubs, or gallops.  ABDOMEN: Soft, nondistended, nontender. Bowel sounds present. No organomegaly or mass.  EXTREMITIES: No pedal edema, cyanosis, or clubbing.  NEUROLOGIC: Cranial nerves II through XII are intact. Muscle strength 5/5 in all extremities. Sensation intact. Gait not checked.  PSYCHIATRIC: The patient is alert and oriented x 2 to place and person but not to time.  Normal affect and good eye  contact. SKIN: No obvious rash, lesion, or ulcer.   LABORATORY PANEL:   CBC Recent Labs  Lab 03/23/20 1334  WBC 5.2  HGB 11.9*  HCT 36.1*  PLT 134*   ------------------------------------------------------------------------------------------------------------------  Chemistries  Recent Labs  Lab 03/23/20 1334  NA 137  K 3.9  CL 106  CO2 22  GLUCOSE 116*  BUN 15  CREATININE 1.24  CALCIUM 8.7*   ------------------------------------------------------------------------------------------------------------------  Cardiac Enzymes No results for input(s): TROPONINI in the last 168 hours. ------------------------------------------------------------------------------------------------------------------  RADIOLOGY:  DG Chest 2 View  Result Date: 03/23/2020 CLINICAL DATA:  Weakness EXAM: CHEST - 2 VIEW COMPARISON:  03/03/2020 FINDINGS: Cardiac shadow is enlarged but stable. Aortic calcifications are again seen. Previously seen vascular congestion has resolved in the interval. No focal infiltrate or effusion is seen. No bony abnormality is noted. IMPRESSION: No acute abnormality seen. Electronically Signed   By: Alcide CleverMark  Lukens M.D.   On: 03/23/2020 19:12   CT Head Wo Contrast  Result Date: 03/23/2020 CLINICAL DATA:  Delay area.  Weakness. EXAM: CT HEAD WITHOUT CONTRAST TECHNIQUE: Contiguous axial images were obtained from the base of the skull through the vertex without intravenous contrast. COMPARISON:  Brain MRI 10/08/2019 FINDINGS: Brain: No hemorrhage or evidence of acute ischemia. Mild generalized atrophy. There is ventricular dilatation which is stable from prior MRI, but greater than expected for age, likely due to central atrophy. No midline shift or mass effect. No extra-axial or subdural collection. Occasional basal gangliar mineralization typically senescent. Vascular: No hyperdense vessel. Skull: No fracture or focal lesion. Sinuses/Orbits: Paranasal sinuses and mastoid air  cells are clear. The visualized orbits are unremarkable. Mild leftward nasal septal deviation. Other: None. IMPRESSION: 1. No acute intracranial abnormality. 2. Stable but age advanced atrophy from MRI in March. Electronically Signed   By: Narda RutherfordMelanie  Sanford M.D.   On: 03/23/2020 22:42      IMPRESSION AND PLAN:   1.  Generalized weakness. -The patient will be admitted to a medically monitored bed. -Physical therapy consult will be obtained. -Neurochecks will be followed every 4 hours for 24 hours.  2.  Transient hypoxia with exertion. -Patient has had chest CTA to come back with moderate cardiomegaly and left ventricular and left atrial dilation with clear lungs and no evidence for PE.  3.  Chronic atrial fibrillation with controlled ventricular response. -We will continue amiodarone, digoxin and Coreg.  4.  Essential hypertension. -Coreg and Cozaar will be continued.  5.  Vitamin B12 deficiency. -Vitamin B12 will be continued.  6.  Seizure disorder. -We will continue Depakote and Keppra.  7.  DVT  prophylaxis. -Subcutaneous Lovenox.  All the records are reviewed and case discussed with ED provider. The plan of care was discussed in details with the patient (and family). I answered all questions. The patient agreed to proceed with the above mentioned plan. Further management will depend upon hospital course.   CODE STATUS: Full code  Status is: Inpatient  Remains inpatient appropriate because:Ongoing diagnostic testing needed not appropriate for outpatient work up, Unsafe d/c plan, IV treatments appropriate due to intensity of illness or inability to take PO and Inpatient level of care appropriate due to severity of illness   Dispo: The patient is from: Group home              Anticipated d/c is to: Group home              Anticipated d/c date is: 2 days              Patient currently is not medically stable to d/c.   TOTAL TIME TAKING CARE OF THIS PATIENT: 55 minutes.     Hannah Beat M.D on 03/23/2020 at 11:49 PM  [facility]   From 7 PM-7 AM, contact night-coverage [email or web address]  CC: Primary care physician; [name]   Note: This dictation was prepared with Dragon dictation along with smaller phrase technology. Any transcriptional typo errors that result from this process are unintentional.

## 2020-03-23 NOTE — ED Triage Notes (Signed)
Pt here for weakness. Pt was seen here yesterday for same compliant. Pt states that he still feels bad.

## 2020-03-24 ENCOUNTER — Encounter: Payer: Self-pay | Admitting: Family Medicine

## 2020-03-24 ENCOUNTER — Inpatient Hospital Stay: Payer: Medicare Other

## 2020-03-24 ENCOUNTER — Telehealth: Payer: Self-pay | Admitting: Nurse Practitioner

## 2020-03-24 ENCOUNTER — Encounter: Payer: Medicare Other | Admitting: Physical Therapy

## 2020-03-24 DIAGNOSIS — I5022 Chronic systolic (congestive) heart failure: Secondary | ICD-10-CM

## 2020-03-24 DIAGNOSIS — E43 Unspecified severe protein-calorie malnutrition: Secondary | ICD-10-CM

## 2020-03-24 DIAGNOSIS — I959 Hypotension, unspecified: Secondary | ICD-10-CM

## 2020-03-24 DIAGNOSIS — F79 Unspecified intellectual disabilities: Secondary | ICD-10-CM

## 2020-03-24 DIAGNOSIS — G40909 Epilepsy, unspecified, not intractable, without status epilepticus: Secondary | ICD-10-CM

## 2020-03-24 DIAGNOSIS — I4819 Other persistent atrial fibrillation: Secondary | ICD-10-CM

## 2020-03-24 LAB — BASIC METABOLIC PANEL
Anion gap: 12 (ref 5–15)
BUN: 14 mg/dL (ref 6–20)
CO2: 22 mmol/L (ref 22–32)
Calcium: 8.8 mg/dL — ABNORMAL LOW (ref 8.9–10.3)
Chloride: 103 mmol/L (ref 98–111)
Creatinine, Ser: 0.99 mg/dL (ref 0.61–1.24)
GFR calc Af Amer: >60 mL/min (ref >60)
GFR calc non Af Amer: >60 mL/min (ref >60)
Glucose, Bld: 81 mg/dL (ref 70–99)
Potassium: 3.7 mmol/L (ref 3.5–5.1)
Sodium: 137 mmol/L (ref 135–145)

## 2020-03-24 LAB — CBC
HCT: 34.5 % — ABNORMAL LOW (ref 39.0–52.0)
Hemoglobin: 11.9 g/dL — ABNORMAL LOW (ref 13.0–17.0)
MCH: 32 pg (ref 26.0–34.0)
MCHC: 34.5 g/dL (ref 30.0–36.0)
MCV: 92.7 fL (ref 80.0–100.0)
Platelets: 132 10*3/uL — ABNORMAL LOW (ref 150–400)
RBC: 3.72 MIL/uL — ABNORMAL LOW (ref 4.22–5.81)
RDW: 15.3 % (ref 11.5–15.5)
WBC: 6.2 10*3/uL (ref 4.0–10.5)
nRBC: 0 % (ref 0.0–0.2)

## 2020-03-24 LAB — LACTIC ACID, PLASMA: Lactic Acid, Venous: 0.7 mmol/L (ref 0.5–1.9)

## 2020-03-24 LAB — TSH: TSH: 6.86 u[IU]/mL — ABNORMAL HIGH (ref 0.350–4.500)

## 2020-03-24 LAB — TROPONIN I (HIGH SENSITIVITY): Troponin I (High Sensitivity): 17 ng/L (ref <18)

## 2020-03-24 MED ORDER — GUAIFENESIN ER 600 MG PO TB12: 600.0000 mg | ORAL_TABLET | Freq: Two times a day (BID) | ORAL | Status: DC | PRN

## 2020-03-24 MED ORDER — ONDANSETRON HCL 4 MG PO TABS: 4.0000 mg | ORAL_TABLET | Freq: Four times a day (QID) | ORAL | Status: DC | PRN

## 2020-03-24 MED ORDER — POTASSIUM CHLORIDE IN NACL 20-0.9 MEQ/L-% IV SOLN
INTRAVENOUS | Status: AC
  Filled 2020-03-24 (×2): qty 1000

## 2020-03-24 MED ORDER — AMIODARONE HCL 200 MG PO TABS
200.0000 mg | ORAL_TABLET | Freq: Two times a day (BID) | ORAL | Status: DC
  Administered 2020-03-24 – 2020-03-29 (×12): 200 mg via ORAL
  Filled 2020-03-24 (×13): qty 1

## 2020-03-24 MED ORDER — ADULT MULTIVITAMIN W/MINERALS CH
1.0000 | ORAL_TABLET | Freq: Every day | ORAL | Status: DC
  Administered 2020-03-24 – 2020-03-29 (×5): 1 via ORAL
  Filled 2020-03-24 (×5): qty 1

## 2020-03-24 MED ORDER — PSYLLIUM 95 % PO PACK
PACK | Freq: Every day | ORAL | Status: DC
  Administered 2020-03-26 – 2020-03-29 (×4): 1 via ORAL
  Filled 2020-03-24 (×6): qty 1

## 2020-03-24 MED ORDER — ENSURE ENLIVE PO LIQD
270.0000 mL | Freq: Every day | ORAL | Status: DC
  Administered 2020-03-27 – 2020-03-28 (×2): 270 mL via ORAL

## 2020-03-24 MED ORDER — PANTOPRAZOLE SODIUM 40 MG PO TBEC
40.0000 mg | DELAYED_RELEASE_TABLET | Freq: Every day | ORAL | Status: DC
  Administered 2020-03-24 – 2020-03-29 (×6): 40 mg via ORAL
  Filled 2020-03-24 (×6): qty 1

## 2020-03-24 MED ORDER — AMIODARONE HCL 200 MG PO TABS: 200.0000 mg | ORAL_TABLET | Freq: Every day | ORAL | Status: DC

## 2020-03-24 MED ORDER — LEVETIRACETAM 500 MG PO TABS
1000.0000 mg | ORAL_TABLET | Freq: Every day | ORAL | Status: DC
  Administered 2020-03-24 – 2020-03-29 (×5): 1000 mg via ORAL
  Filled 2020-03-24 (×5): qty 2

## 2020-03-24 MED ORDER — VITAMIN B-12 1000 MCG PO TABS
1000.0000 ug | ORAL_TABLET | Freq: Every day | ORAL | Status: DC
  Administered 2020-03-24 – 2020-03-29 (×6): 1000 ug via ORAL
  Filled 2020-03-24 (×6): qty 1

## 2020-03-24 MED ORDER — RISPERIDONE 1 MG PO TABS
2.0000 mg | ORAL_TABLET | Freq: Every day | ORAL | Status: DC
  Administered 2020-03-24 (×2): 2 mg via ORAL
  Filled 2020-03-24 (×3): qty 2

## 2020-03-24 MED ORDER — TRAZODONE HCL 50 MG PO TABS: 25.0000 mg | ORAL_TABLET | Freq: Every evening | ORAL | Status: DC | PRN

## 2020-03-24 MED ORDER — FLUTICASONE PROPIONATE 50 MCG/ACT NA SUSP
2.0000 | Freq: Every day | NASAL | Status: DC
  Administered 2020-03-26 – 2020-03-29 (×4): 2 via NASAL
  Filled 2020-03-24 (×2): qty 16

## 2020-03-24 MED ORDER — MECLIZINE HCL 25 MG PO TABS
25.0000 mg | ORAL_TABLET | Freq: Three times a day (TID) | ORAL | Status: DC | PRN
  Filled 2020-03-24: qty 1

## 2020-03-24 MED ORDER — SODIUM CHLORIDE 0.9 % IV SOLN: INTRAVENOUS | Status: DC

## 2020-03-24 MED ORDER — MAGNESIUM HYDROXIDE 400 MG/5ML PO SUSP: 30.0000 mL | Freq: Every day | ORAL | Status: DC | PRN

## 2020-03-24 MED ORDER — ONDANSETRON HCL 4 MG/2ML IJ SOLN: 4.0000 mg | Freq: Four times a day (QID) | INTRAMUSCULAR | Status: DC | PRN

## 2020-03-24 MED ORDER — ASPIRIN EC 81 MG PO TBEC
81.0000 mg | DELAYED_RELEASE_TABLET | Freq: Every day | ORAL | Status: DC
  Administered 2020-03-24 – 2020-03-29 (×6): 81 mg via ORAL
  Filled 2020-03-24 (×6): qty 1

## 2020-03-24 MED ORDER — DIVALPROEX SODIUM 500 MG PO DR TAB
500.0000 mg | DELAYED_RELEASE_TABLET | Freq: Three times a day (TID) | ORAL | Status: DC
  Administered 2020-03-24 – 2020-03-29 (×16): 500 mg via ORAL
  Filled 2020-03-24 (×19): qty 1

## 2020-03-24 MED ORDER — ACETAMINOPHEN 325 MG PO TABS: 650.0000 mg | ORAL_TABLET | Freq: Four times a day (QID) | ORAL | Status: DC | PRN

## 2020-03-24 MED ORDER — MIDODRINE HCL 5 MG PO TABS
10.0000 mg | ORAL_TABLET | Freq: Three times a day (TID) | ORAL | Status: DC
  Administered 2020-03-24 – 2020-03-29 (×16): 10 mg via ORAL
  Filled 2020-03-24 (×17): qty 2

## 2020-03-24 MED ORDER — FENOFIBRATE 160 MG PO TABS
160.0000 mg | ORAL_TABLET | Freq: Every day | ORAL | Status: DC
  Administered 2020-03-24 – 2020-03-29 (×5): 160 mg via ORAL
  Filled 2020-03-24 (×6): qty 1

## 2020-03-24 MED ORDER — ALBUTEROL SULFATE (2.5 MG/3ML) 0.083% IN NEBU: 2.5000 mg | INHALATION_SOLUTION | Freq: Four times a day (QID) | RESPIRATORY_TRACT | Status: DC | PRN

## 2020-03-24 MED ORDER — LEVETIRACETAM 500 MG PO TABS
1500.0000 mg | ORAL_TABLET | Freq: Every evening | ORAL | Status: DC
  Administered 2020-03-24 – 2020-03-29 (×6): 1500 mg via ORAL
  Filled 2020-03-24 (×6): qty 3

## 2020-03-24 MED ORDER — VITAMIN D 25 MCG (1000 UNIT) PO TABS
2000.0000 [IU] | ORAL_TABLET | Freq: Every day | ORAL | Status: DC
  Administered 2020-03-24 – 2020-03-29 (×6): 2000 [IU] via ORAL
  Filled 2020-03-24 (×6): qty 2

## 2020-03-24 MED ORDER — FAMOTIDINE 20 MG PO TABS
40.0000 mg | ORAL_TABLET | Freq: Every day | ORAL | Status: DC
  Administered 2020-03-24 – 2020-03-28 (×6): 40 mg via ORAL
  Filled 2020-03-24 (×6): qty 2

## 2020-03-24 MED ORDER — DIGOXIN 125 MCG PO TABS
0.0625 mg | ORAL_TABLET | Freq: Every day | ORAL | Status: DC
  Administered 2020-03-24 – 2020-03-29 (×6): 0.0625 mg via ORAL
  Filled 2020-03-24 (×7): qty 0.5

## 2020-03-24 MED ORDER — ENOXAPARIN SODIUM 40 MG/0.4ML ~~LOC~~ SOLN
40.0000 mg | SUBCUTANEOUS | Status: DC
  Administered 2020-03-24 – 2020-03-29 (×6): 40 mg via SUBCUTANEOUS
  Filled 2020-03-24 (×6): qty 0.4

## 2020-03-24 MED ORDER — LOSARTAN POTASSIUM 50 MG PO TABS
25.0000 mg | ORAL_TABLET | Freq: Every day | ORAL | Status: DC
  Administered 2020-03-24: 25 mg via ORAL
  Filled 2020-03-24: qty 1

## 2020-03-24 MED ORDER — ACETAMINOPHEN 650 MG RE SUPP: 650.0000 mg | Freq: Four times a day (QID) | RECTAL | Status: DC | PRN

## 2020-03-24 MED ORDER — LORATADINE 10 MG PO TABS
10.0000 mg | ORAL_TABLET | Freq: Every day | ORAL | Status: DC
  Administered 2020-03-24 – 2020-03-29 (×6): 10 mg via ORAL
  Filled 2020-03-24 (×6): qty 1

## 2020-03-24 MED ORDER — RISPERIDONE 1 MG PO TABS
1.0000 mg | ORAL_TABLET | Freq: Every day | ORAL | Status: DC
  Administered 2020-03-24 – 2020-03-25 (×2): 1 mg via ORAL
  Filled 2020-03-24 (×2): qty 1

## 2020-03-24 MED ORDER — IOHEXOL 350 MG/ML SOLN
75.0000 mL | Freq: Once | INTRAVENOUS | Status: AC | PRN
  Administered 2020-03-24: 75 mL via INTRAVENOUS
  Filled 2020-03-24: qty 75

## 2020-03-24 NOTE — Progress Notes (Signed)
Patient is unable to answer admission questions.

## 2020-03-24 NOTE — Evaluation (Signed)
Occupational Therapy Evaluation Patient Details Name: Raymond [name] MRN: [id number] DOB: 1960/12/13 Today's Date: 03/24/2020    History of Present Illness Patient is a 59 year old male brought in by EMS for weakness from his group home. Recent hospitalization for seizure and unresponsive. PMHx includes atrial fibrillation, seizures, intellectual disability, OCD, severe MVR, CHF.   Clinical Impression   Pt was seen for OT evaluation this date. Per chart review of recent admission, pt was ambulating without AD and requiring some assist for ADL at his group home. Per chart, group home terminated outpatient PT in the past couple months. Pt presenting with significant lethargy despite sternal rubs limiting comprehensive evaluation of ADL/mobility. Pt able to briefly follow simple commands for MMT. At least 3+/5 in all limbs. Difficulty maintaining midline while semi-reclined in bed requiring Max A for repositioning. BP in supine 76/63, HOB elevated slightly and after active assisted ankle pumps, BP 81/65, 84/64. RN notified. OOB attempts deferred 2/2 BP and lethargy. Pt currently demonstrates impairments as described below (See OT problem list) which functionally limit his ability to perform ADL/self-care tasks and ADL mobility. Pt currently requires significant assist for all aspects of ADL.  Pt would benefit from skilled OT to address noted impairments and functional limitations (see below for any additional details) in order to maximize safety and independence while minimizing falls risk and caregiver burden. Upon hospital discharge, recommend STR to maximize pt safety and return to PLOF. Will continue to assess functional ability next session to better inform discharge recommendation.    Follow Up Recommendations  SNF    Equipment Recommendations       Recommendations for Other Services       Precautions / Restrictions Precautions Precautions: Fall Precaution Comments: watch  BP Restrictions Weight Bearing Restrictions: No      Mobility Bed Mobility               General bed mobility comments: deferred, pt very lethargic  Transfers                 General transfer comment: deferred, pt very lethargic    Balance Overall balance assessment: Needs assistance;History of Falls     Sitting balance - Comments: pt unable to maintain midline while semi-reclined in bed                                   ADL either performed or assessed with clinical judgement   ADL Overall ADL's : Needs assistance/impaired                                       General ADL Comments: at time of evaluation, ADL assessment limited 2/2 significant lethargy; Max assist for holding cup to drink     Vision         Perception     Praxis      Pertinent Vitals/Pain Pain Assessment: No/denies pain     Hand Dominance     Extremity/Trunk Assessment Upper Extremity Assessment Upper Extremity Assessment: Generalized weakness;Difficult to assess due to impaired cognition (at least 3+/5 bilat)   Lower Extremity Assessment Lower Extremity Assessment: Generalized weakness;Difficult to assess due to impaired cognition (at least 3+/5 bilat)       Communication Communication Communication: Other (comment) (minimally alert, able to respond to simple questions some of the  time)   Cognition Arousal/Alertness: Lethargic Behavior During Therapy: Flat affect Overall Cognitive Status: History of cognitive impairments - at baseline                                 General Comments: Pt minimally alert, unable to keep eyes open despite sternal rubs and assist for bed level repositioning. Follows commands briefly for limited MMT of BUE and BLE   General Comments  BP supine 76/63, HOB elevated slightly and after active assisted ankle pumps, BP 81/65, 84/64.    Exercises     Shoulder Instructions      Home Living  Family/patient expects to be discharged to:: Group home                                        Prior Functioning/Environment Level of Independence: Needs assistance  Gait / Transfers Assistance Needed: per recent admission pt walks "sometimes", pt poor historian and no caregivers present ADL's / Homemaking Assistance Needed: per recent admission, pt requires assist for ADL, unclear on level of assist            OT Problem List: Decreased strength;Cardiopulmonary status limiting activity;Decreased cognition;Impaired balance (sitting and/or standing);Decreased knowledge of use of DME or AE      OT Treatment/Interventions: Self-care/ADL training;Therapeutic exercise;Therapeutic activities;DME and/or AE instruction;Patient/family education;Balance training    OT Goals(Current goals can be found in the care plan section) Acute Rehab OT Goals Patient Stated Goal: none, pt unable to state OT Goal Formulation: Patient unable to participate in goal setting Time For Goal Achievement: 04/07/20 Potential to Achieve Goals: Fair ADL Goals Pt Will Transfer to Toilet: with min assist;bedside commode;stand pivot transfer Additional ADL Goal #1: Pt will sit EOB with CGA for seated table top ADL tasks without LOB and maintaining alertness throughout.  OT Frequency: Min 1X/week   Barriers to D/C:            Co-evaluation              AM-PAC OT "6 Clicks" Daily Activity     Outcome Measure Help from another person eating meals?: A Lot Help from another person taking care of personal grooming?: A Lot Help from another person toileting, which includes using toliet, bedpan, or urinal?: A Lot Help from another person bathing (including washing, rinsing, drying)?: A Lot Help from another person to put on and taking off regular upper body clothing?: A Lot Help from another person to put on and taking off regular lower body clothing?: A Lot 6 Click Score: 12   End of Session  Nurse Communication: Other (comment) (BP and lethargy)  Activity Tolerance: Patient limited by lethargy Patient left: in bed;with call bell/phone within reach  OT Visit Diagnosis: Other abnormalities of gait and mobility (R26.89);Muscle weakness (generalized) (M62.81);History of falling (Z91.81)                Time: 7948-0165 OT Time Calculation (min): 12 min Charges:  OT General Charges $OT Visit: 1 Visit OT Evaluation $OT Eval Moderate Complexity: 1 Mod  Richrd Prime, MPH, MS, OTR/L ascom [phone] 03/24/20, 3:50 PM

## 2020-03-24 NOTE — Progress Notes (Signed)
[facility] Room ED [address] [facility]) Hospital Liaison RN note:  This patient is currently being followed by Outpatient Based Palliative Care with [employer]. Notified TOC Dagoberto Reef via chat.  We will follow for disposition.  Thank you.  Cyndra Numbers, RN [facility] Liaison [phone]

## 2020-03-24 NOTE — ED Notes (Signed)
PT at bedside.

## 2020-03-24 NOTE — Progress Notes (Signed)
PROGRESS NOTE  Raymond [name] HBZ:[id number] DOB: Nov 25, 1960   PCP: [name]  Patient is from: ALF  DOA: 03/23/2020 LOS: 1  Brief Narrative / Interim history: 59 year old male with history of seizure disorder, intellectual disability, OCD, severe MVR, systolic CHF, A. Fib not on anticoagulation, hypertension and recent hospitalization from 8/2-8/12 for seizure, CHF and A. fib with RVR who presented from ALF with generalized weakness.   ED, hemodynamically stable.  Afebrile.  95% on room air.  Reportedly transient desaturation.  CBC and BMP without significant finding other than mild thrombocytopenia to 134.  High-sensitivity troponin negative x2.  Lactic acid negative x2.  BNP elevated to 414.  Ammonia 34.  Digoxin level subtherapeutic at 0.4.  Valproic acid subtherapeutic at 22.  UA without significant finding.  COVID-19 PCR negative.  CXR, CT head without contrast and CTA chest without acute or significant finding.  Patient was admitted for generalized weakness and transient hypoxia with exertion.  Subjective: Seen and examined earlier this morning.  No major events overnight of this morning.  No complaints but not a great historian.  Oriented to self and partial place.  Responds no to pain or difficulty breathing.  Objective: Vitals:   03/24/20 0830 03/24/20 1000 03/24/20 1030 03/24/20 1130  BP: 99/78 100/69 104/61 (!) 81/61  Pulse:  95    Resp: (!) 21 19 (!) 27 (!) 28  Temp:      TempSrc:      SpO2:  99%    Weight:      Height:       No intake or output data in the 24 hours ending 03/24/20 1320 Filed Weights   03/23/20 1303  Weight: 58.2 kg    Examination:  GENERAL: No apparent distress.  Nontoxic. HEENT: MMM.  Vision and hearing grossly intact.  NECK: Supple.  No apparent JVD.  RESP: On RA.  No IWOB.  Fair aeration bilaterally. CVS:  RRR. Heart sounds normal.  ABD/GI/GU: BS+. Abd soft, NTND.  MSK/EXT:  Moves extremities.  Some muscle mass and subcu fat  loss SKIN: no apparent skin lesion or wound NEURO: Awake, alert and oriented to self.  Knows he is in the hospital.  Follows commands.  No apparent acute neuro deficit. PSYCH: Calm. Normal affect.   Procedures:  None  Microbiology summarized: None  Assessment & Plan: Hypotension: Chronic issue.  Recently discharged on midodrine.  BP 81/61.  No signs of infection. -Discontinue home losartan -Continue home midodrine 10 mg 3 times daily -Start gentle IV fluid -Check TSH and a.m. cortisol -Check orthostatic vitals  Generalized weakness: Could be due to hypotension and polypharmacy.  Also has history of severe mitral valve regurgitation which could contribute.  No focal neuro symptoms.  Appears euvolemic.  He is on Keppra (2500 mg daily), risperidone and meclizine. -PT/OT eval. -Manage hypertension as above.  Transient hypoxia with exertion: Resolved.  CTA chest negative for PE or other acute finding.  Chronic systolic CHF: Echo on 03/01/2020 with EF of 40 to 45%, global hypokinesis, moderate LAE and moderate to severe MVR.  Appears euvolemic.  Not on diuretics.  BNP 414 but lower than previous value.  CXR without acute finding.  Report of transient ischemia but saturating well on room air now. -Monitor fluid status -Hold home losartan in the setting of hypotension  Persistent atrial fibrillation: Rate controlled.  CHA2DS2-VASc score 1 for CHF.  Recently discharged on amiodarone, Coreg and digoxin.  Not on anticoagulation.  Digoxin level subtherapeutic at 0.4.  Noncompliance? -Continue amiodarone and digoxin  Moderate care severe mitral valve insufficiency-evaluated previous hospitalization and deemed to be not a surgical candidate.  History of seizure disorder: Recently hospitalized and discharged on Keppra, and Depakote.  Depakote level subtherapeutic. -Continue home Keppra 1000 mg in the morning and 1500 mg in the evening -Continue home Depakote 500 mg 3 times daily. -We will run by  neurology for further recommendation.  Intellectual disability -Supportive care.  Vitamin B12 deficiency. -Vitamin B12 will be continued.  Severe malnutrition: As evidenced by some muscle mass and subcu fat loss.  He had about 22 pound weight loss in the last year but stable since last hospitalization earlier this month. Body mass index is 17.4 kg/m.  Consult dietitian      Wt Readings from Last 10 Encounters:  03/23/20 58.2 kg  03/22/20 58.2 kg  03/09/20 58.2 kg  03/23/19 68 kg  12/19/17 63.9 kg  11/12/17 65.2 kg  09/19/17 65.3 kg  02/11/17 62.4 kg  09/24/16 62.9 kg  08/09/16 66.7 kg     DVT prophylaxis:  enoxaparin (LOVENOX) injection 40 mg Start: 03/24/20 0130  Code Status: Full code Family Communication: Updated patient's guardian Status is: Inpatient  Remains inpatient appropriate because:Hemodynamically unstable, IV treatments appropriate due to intensity of illness or inability to take PO and Inpatient level of care appropriate due to severity of illness   Dispo: The patient is from: ALF              Anticipated d/c is to: ALF              Anticipated d/c date is: 1 day              Patient currently is not medically stable to d/c.       Consultants:  Neurology   Sch Meds:  Scheduled Meds:  amiodarone  200 mg Oral BID   aspirin EC  81 mg Oral Daily   cholecalciferol  2,000 Units Oral Daily   digoxin  0.0625 mg Oral Daily   divalproex  500 mg Oral TID WC   enoxaparin (LOVENOX) injection  40 mg Subcutaneous Q24H   famotidine  40 mg Oral QHS   feeding supplement (ENSURE ENLIVE)  270 mL Oral Daily   fenofibrate  160 mg Oral Daily   fluticasone  2 spray Each Nare Daily   levETIRAcetam  1,000 mg Oral Daily   levETIRAcetam  1,500 mg Oral QPM   loratadine  10 mg Oral Daily   midodrine  10 mg Oral TID WC   multivitamin with minerals  1 tablet Oral Daily   pantoprazole  40 mg Oral Daily   psyllium   Oral Daily   risperiDONE  1 mg  Oral Daily   risperiDONE  2 mg Oral QHS   vitamin B-12  1,000 mcg Oral Daily   Continuous Infusions: PRN Meds:.acetaminophen **OR** acetaminophen, albuterol, guaiFENesin, magnesium hydroxide, meclizine, ondansetron **OR** ondansetron (ZOFRAN) IV, traZODone  Antimicrobials: Anti-infectives (From admission, onward)   None       I have personally reviewed the following labs and images: CBC: Recent Labs  Lab 03/22/20 1140 03/23/20 1334 03/24/20 0757  WBC 4.6 5.2 6.2  HGB 10.6* 11.9* 11.9*  HCT 32.8* 36.1* 34.5*  MCV 97.9 96.0 92.7  PLT 133* 134* 132*   BMP &GFR Recent Labs  Lab 03/22/20 1140 03/23/20 1334 03/24/20 0757  NA 134* 137 137  K 3.8 3.9 3.7  CL 106 106 103  CO2 17* 22  22  GLUCOSE 85 116* 81  BUN 13 15 14   CREATININE 1.22 1.24 0.99  CALCIUM 8.1* 8.7* 8.8*   Estimated Creatinine Clearance: 67.8 mL/min (by C-G formula based on SCr of 0.99 mg/dL). Liver & Pancreas: No results for input(s): AST, ALT, ALKPHOS, BILITOT, PROT, ALBUMIN in the last 168 hours. No results for input(s): LIPASE, AMYLASE in the last 168 hours. Recent Labs  Lab 03/23/20 2200  AMMONIA 34   Diabetic: No results for input(s): HGBA1C in the last 72 hours. No results for input(s): GLUCAP in the last 168 hours. Cardiac Enzymes: No results for input(s): CKTOTAL, CKMB, CKMBINDEX, TROPONINI in the last 168 hours. No results for input(s): PROBNP in the last 8760 hours. Coagulation Profile: No results for input(s): INR, PROTIME in the last 168 hours. Thyroid Function Tests: No results for input(s): TSH, T4TOTAL, FREET4, T3FREE, THYROIDAB in the last 72 hours. Lipid Profile: No results for input(s): CHOL, HDL, LDLCALC, TRIG, CHOLHDL, LDLDIRECT in the last 72 hours. Anemia Panel: No results for input(s): VITAMINB12, FOLATE, FERRITIN, TIBC, IRON, RETICCTPCT in the last 72 hours. Urine analysis:    Component Value Date/Time   COLORURINE YELLOW (A) 03/22/2020 1551   APPEARANCEUR CLEAR (A)  03/22/2020 1551   APPEARANCEUR Clear 12/19/2017 1424   LABSPEC 1.009 03/22/2020 1551   PHURINE 5.0 03/22/2020 1551   GLUCOSEU NEGATIVE 03/22/2020 1551   HGBUR NEGATIVE 03/22/2020 1551   BILIRUBINUR NEGATIVE 03/22/2020 1551   BILIRUBINUR Negative 12/19/2017 1424   KETONESUR NEGATIVE 03/22/2020 1551   PROTEINUR NEGATIVE 03/22/2020 1551   NITRITE NEGATIVE 03/22/2020 1551   LEUKOCYTESUR NEGATIVE 03/22/2020 1551   Sepsis Labs: Invalid input(s): PROCALCITONIN, LACTICIDVEN  Microbiology: Recent Results (from the past 240 hour(s))  SARS Coronavirus 2 by RT PCR (hospital order, performed in [facility] hospital lab) Nasopharyngeal Nasopharyngeal Swab     Status: None   Collection Time: 03/22/20  1:56 PM   Specimen: Nasopharyngeal Swab  Result Value Ref Range Status   SARS Coronavirus 2 NEGATIVE NEGATIVE Final    Comment: (NOTE) SARS-CoV-2 target nucleic acids are NOT DETECTED.  The SARS-CoV-2 RNA is generally detectable in upper and lower respiratory specimens during the acute phase of infection. The lowest concentration of SARS-CoV-2 viral copies this assay can detect is 250 copies / mL. A negative result does not preclude SARS-CoV-2 infection and should not be used as the sole basis for treatment or other patient management decisions.  A negative result may occur with improper specimen collection / handling, submission of specimen other than nasopharyngeal swab, presence of viral mutation(s) within the areas targeted by this assay, and inadequate number of viral copies (<250 copies / mL). A negative result must be combined with clinical observations, patient history, and epidemiological information.  Fact Sheet for Patients:   03/24/20  Fact Sheet for Healthcare Providers: BoilerBrush.com.cy  This test is not yet approved or  cleared by the https://pope.com/ FDA and has been authorized for detection and/or diagnosis of  SARS-CoV-2 by FDA under an Emergency Use Authorization (EUA).  This EUA will remain in effect (meaning this test can be used) for the duration of the COVID-19 declaration under Section 564(b)(1) of the Act, 21 U.S.C. section 360bbb-3(b)(1), unless the authorization is terminated or revoked sooner.  Performed at [facility], [address] [place], [place]     Radiology Studies: DG Chest 2 View  Result Date: 03/23/2020 CLINICAL DATA:  Weakness EXAM: CHEST - 2 VIEW COMPARISON:  03/03/2020 FINDINGS: Cardiac shadow is enlarged  but stable. Aortic calcifications are again seen. Previously seen vascular congestion has resolved in the interval. No focal infiltrate or effusion is seen. No bony abnormality is noted. IMPRESSION: No acute abnormality seen. Electronically Signed   By: Alcide CleverMark  Lukens M.D.   On: 03/23/2020 19:12   CT Head Wo Contrast  Result Date: 03/23/2020 CLINICAL DATA:  Delay area.  Weakness. EXAM: CT HEAD WITHOUT CONTRAST TECHNIQUE: Contiguous axial images were obtained from the base of the skull through the vertex without intravenous contrast. COMPARISON:  Brain MRI 10/08/2019 FINDINGS: Brain: No hemorrhage or evidence of acute ischemia. Mild generalized atrophy. There is ventricular dilatation which is stable from prior MRI, but greater than expected for age, likely due to central atrophy. No midline shift or mass effect. No extra-axial or subdural collection. Occasional basal gangliar mineralization typically senescent. Vascular: No hyperdense vessel. Skull: No fracture or focal lesion. Sinuses/Orbits: Paranasal sinuses and mastoid air cells are clear. The visualized orbits are unremarkable. Mild leftward nasal septal deviation. Other: None. IMPRESSION: 1. No acute intracranial abnormality. 2. Stable but age advanced atrophy from MRI in March. Electronically Signed   By: Narda RutherfordMelanie  Sanford M.D.   On: 03/23/2020 22:42   CT Angio Chest PE W and/or Wo Contrast  Result  Date: 03/24/2020 CLINICAL DATA:  Hypoxia EXAM: CT ANGIOGRAPHY CHEST WITH CONTRAST TECHNIQUE: Multidetector CT imaging of the chest was performed using the standard protocol during bolus administration of intravenous contrast. Multiplanar CT image reconstructions and MIPs were obtained to evaluate the vascular anatomy. CONTRAST:  75mL OMNIPAQUE IOHEXOL 350 MG/ML SOLN COMPARISON:  None. FINDINGS: Cardiovascular: There is excellent opacification of the central pulmonary arteries. No intraluminal filling defect to suggest acute pulmonary embolism. The central pulmonary arteries are of normal caliber. There is moderate cardiomegaly with left ventricular and left atrial dilation. Mild coronary artery calcification. No pericardial effusion. The thoracic aorta is of normal caliber. Minimal atherosclerotic calcification within the thoracic aorta. Mediastinum/Nodes: No enlarged mediastinal, hilar, or axillary lymph nodes. Thyroid gland, trachea, and esophagus demonstrate no significant findings. Lungs/Pleura: Imaging is slightly limited by motion artifact. The lungs are clear. No pneumothorax or pleural effusion. Central airways are widely patent. Upper Abdomen: No acute abnormality. Musculoskeletal: No chest wall abnormality. No acute or significant osseous findings. Review of the MIP images confirms the above findings. IMPRESSION: 1. No evidence of acute pulmonary embolism. 2. Moderate cardiomegaly with left ventricular and left atrial dilation. 3. Lungs are clear. Aortic Atherosclerosis (ICD10-I70.0). Electronically Signed   By: Helyn NumbersAshesh  Parikh MD   On: 03/24/2020 00:26     Catilyn Boggus T. Jalyiah Shelley [facility]  If 7PM-7AM, please contact night-coverage [email or web address] 03/24/2020, 1:20 PM

## 2020-03-24 NOTE — Telephone Encounter (Signed)
Spoke with [name] [facility] Director) to schedule the Palliative Consult, she stated that patient was sent back to [facility] ED yesterday.  I told her that I would notify our Liaison Team and they would let me know when he was discharged and that I would call her back after he was discharged from hospital and she was in agreement with this.

## 2020-03-25 DIAGNOSIS — E7220 Disorder of urea cycle metabolism, unspecified: Secondary | ICD-10-CM

## 2020-03-25 DIAGNOSIS — G9341 Metabolic encephalopathy: Secondary | ICD-10-CM

## 2020-03-25 LAB — RENAL FUNCTION PANEL
Albumin: 3.1 g/dL — ABNORMAL LOW (ref 3.5–5.0)
Anion gap: 11 (ref 5–15)
BUN: 13 mg/dL (ref 6–20)
CO2: 24 mmol/L (ref 22–32)
Calcium: 8.5 mg/dL — ABNORMAL LOW (ref 8.9–10.3)
Chloride: 104 mmol/L (ref 98–111)
Creatinine, Ser: 1.1 mg/dL (ref 0.61–1.24)
GFR calc Af Amer: >60 mL/min (ref >60)
GFR calc non Af Amer: >60 mL/min (ref >60)
Glucose, Bld: 92 mg/dL (ref 70–99)
Phosphorus: 3.3 mg/dL (ref 2.5–4.6)
Potassium: 4 mmol/L (ref 3.5–5.1)
Sodium: 139 mmol/L (ref 135–145)

## 2020-03-25 LAB — CBC
HCT: 32.8 % — ABNORMAL LOW (ref 39.0–52.0)
Hemoglobin: 11.3 g/dL — ABNORMAL LOW (ref 13.0–17.0)
MCH: 31.9 pg (ref 26.0–34.0)
MCHC: 34.5 g/dL (ref 30.0–36.0)
MCV: 92.7 fL (ref 80.0–100.0)
Platelets: 121 10*3/uL — ABNORMAL LOW (ref 150–400)
RBC: 3.54 MIL/uL — ABNORMAL LOW (ref 4.22–5.81)
RDW: 15.2 % (ref 11.5–15.5)
WBC: 4.8 10*3/uL (ref 4.0–10.5)
nRBC: 0 % (ref 0.0–0.2)

## 2020-03-25 LAB — MAGNESIUM: Magnesium: 1.9 mg/dL (ref 1.7–2.4)

## 2020-03-25 LAB — AMMONIA: Ammonia: 69 umol/L — ABNORMAL HIGH (ref 9–35)

## 2020-03-25 LAB — BRAIN NATRIURETIC PEPTIDE: B Natriuretic Peptide: 601.1 pg/mL — ABNORMAL HIGH (ref 0.0–100.0)

## 2020-03-25 LAB — VITAMIN B12: Vitamin B-12: 2204 pg/mL — ABNORMAL HIGH (ref 180–914)

## 2020-03-25 LAB — CORTISOL-AM, BLOOD: Cortisol - AM: 14.5 ug/dL (ref 6.7–22.6)

## 2020-03-25 LAB — T4, FREE: Free T4: 1.62 ng/dL — ABNORMAL HIGH (ref 0.61–1.12)

## 2020-03-25 MED ORDER — RISPERIDONE 1 MG PO TABS
1.0000 mg | ORAL_TABLET | Freq: Every day | ORAL | Status: DC
  Administered 2020-03-25 – 2020-03-28 (×4): 1 mg via ORAL
  Filled 2020-03-25 (×5): qty 1

## 2020-03-25 MED ORDER — POTASSIUM CHLORIDE IN NACL 20-0.9 MEQ/L-% IV SOLN
INTRAVENOUS | Status: AC
  Filled 2020-03-25 (×2): qty 1000

## 2020-03-25 MED ORDER — RISPERIDONE 0.5 MG PO TABS
0.5000 mg | ORAL_TABLET | Freq: Every day | ORAL | Status: DC
  Administered 2020-03-26 – 2020-03-29 (×4): 0.5 mg via ORAL
  Filled 2020-03-25 (×4): qty 1

## 2020-03-25 MED ORDER — LACTULOSE 10 GM/15ML PO SOLN
20.0000 g | Freq: Two times a day (BID) | ORAL | Status: DC
  Administered 2020-03-25 – 2020-03-29 (×9): 20 g via ORAL
  Filled 2020-03-25 (×9): qty 30

## 2020-03-25 MED ORDER — COSYNTROPIN 0.25 MG IJ SOLR
0.2500 mg | Freq: Once | INTRAMUSCULAR | Status: AC
  Administered 2020-03-26: 0.25 mg via INTRAVENOUS
  Filled 2020-03-25: qty 0.25

## 2020-03-25 MED ORDER — LACTULOSE ENEMA
300.0000 mL | Freq: Two times a day (BID) | ORAL | Status: DC
  Filled 2020-03-25 (×2): qty 300

## 2020-03-25 NOTE — Progress Notes (Signed)
PT Cancellation Note  Patient Details Name: Raymond [name] MRN: [id number] DOB: 1961/07/08   Cancelled Treatment:    Reason Eval/Treat Not Completed: Fatigue/lethargy limiting ability to participate (Consult received and chart reviewed. Patient sleeping soundly upon arrival to room.  Briefly opens eyes with max cuing/stimulation, but unable to maintain for full participation with evaluation.  Will re-attempt at later time/date as medically appropriate.)   Chanel Mcadams H. Manson Passey, PT, DPT, NCS 03/25/20, 10:14 AM [phone]

## 2020-03-25 NOTE — NC FL2 (Signed)
[place] MEDICAID FL2 LEVEL OF CARE SCREENING TOOL     IDENTIFICATION  Patient Name: Raymond [name] Birthdate: 05-07-1961 Sex: male Admission Date (Current Location): 03/23/2020  [place] and [place] Number:  Chiropodist and Address:  [facility], [address] [place], [place] [address]      Provider Number: [id number]  Attending Physician Name and Address:  Almon Hercules, MD  Relative Name and Phone Number:       Current Level of Care: Hospital Recommended Level of Care: Skilled Nursing Facility Prior Approval Number:    Date Approved/Denied:   PASRR Number: [id number] E Expires 04/08/20  Discharge Plan: SNF    Current Diagnoses: Patient Active Problem List   Diagnosis Date Noted  . Protein-calorie malnutrition, severe 03/05/2020  . Acute kidney injury superimposed on CKD (HCC)   . Goals of care, counseling/discussion   . Palliative care by specialist   . DNR (do not resuscitate) discussion   . Acute on chronic diastolic (congestive) heart failure (HCC) 03/02/2020  . Hypotension   . Mitral valve insufficiency   . Acute on chronic diastolic CHF (congestive heart failure) (HCC) 03/01/2020  . HTN (hypertension) 03/01/2020  . GERD (gastroesophageal reflux disease) 03/01/2020  . New onset atrial fibrillation (HCC) 03/01/2020  . Atrial fibrillation with RVR (HCC) 03/01/2020  . Seizure (HCC)   . Nose fracture 04/09/2016  . Weakness 07/11/2015  . Epileptic seizures (HCC) 01/12/2015  . Intellectual disability   . OCD (obsessive compulsive disorder)   . Hypertriglyceridemia   . Epilepsy (HCC)   . Systolic murmur     Orientation RESPIRATION BLADDER Height & Weight     Self  Normal External catheter, Incontinent (8/27) Weight: 127 lb 9.6 oz (57.9 kg) Height:  6' (182.9 cm)  BEHAVIORAL SYMPTOMS/MOOD NEUROLOGICAL BOWEL NUTRITION STATUS      Incontinent Diet (heart healthy, thin liquids)  AMBULATORY STATUS COMMUNICATION OF NEEDS  Skin   Extensive Assist Verbally Normal                       Personal Care Assistance Level of Assistance  Bathing, Feeding, Dressing Bathing Assistance: Maximum assistance Feeding assistance: Independent Dressing Assistance: Maximum assistance     Functional Limitations Info  Sight, Hearing, Speech Sight Info: Adequate Hearing Info: Adequate Speech Info: Adequate    SPECIAL CARE FACTORS FREQUENCY  OT (By licensed OT), PT (By licensed PT)     PT Frequency: 5x OT Frequency: 5x            Contractures Contractures Info: Not present    Additional Factors Info  Code Status, Allergies Code Status Info: Full code Allergies Info: no known allergies           Current Medications (03/25/2020):  This is the current hospital active medication list Current Facility-Administered Medications  Medication Dose Route Frequency Provider Last Rate Last Admin  . 0.9 % NaCl with KCl 20 mEq/ L  infusion   Intravenous Continuous Candelaria Stagers T, MD 50 mL/hr at 03/24/20 1426 New Bag at 03/24/20 1426  . acetaminophen (TYLENOL) tablet 650 mg  650 mg Oral Q6H PRN Mansy, Jan A, MD       Or  . acetaminophen (TYLENOL) suppository 650 mg  650 mg Rectal Q6H PRN Mansy, Jan A, MD      . albuterol (PROVENTIL) (2.5 MG/3ML) 0.083% nebulizer solution 2.5 mg  2.5 mg Inhalation Q6H PRN Mansy, Vernetta Honey, MD      .  amiodarone (PACERONE) tablet 200 mg  200 mg Oral BID Mansy, Jan A, MD   200 mg at 03/25/20 1029  . aspirin EC tablet 81 mg  81 mg Oral Daily Mansy, Jan A, MD   81 mg at 03/25/20 1029  . cholecalciferol (VITAMIN D3) tablet 2,000 Units  2,000 Units Oral Daily Mansy, Jan A, MD   2,000 Units at 03/25/20 1029  . digoxin (LANOXIN) tablet 0.0625 mg  0.0625 mg Oral Daily Mansy, Jan A, MD   0.0625 mg at 03/25/20 1029  . divalproex (DEPAKOTE) DR tablet 500 mg  500 mg Oral TID WC Mansy, Jan A, MD   500 mg at 03/25/20 1128  . enoxaparin (LOVENOX) injection 40 mg  40 mg Subcutaneous Q24H Mansy, Jan A, MD   40  mg at 03/25/20 0241  . famotidine (PEPCID) tablet 40 mg  40 mg Oral QHS Mansy, Jan A, MD   40 mg at 03/24/20 2116  . feeding supplement (ENSURE ENLIVE) (ENSURE ENLIVE) liquid 270 mL  270 mL Oral Daily Mansy, Jan A, MD      . fenofibrate tablet 160 mg  160 mg Oral Daily Mansy, Jan A, MD   160 mg at 03/24/20 3267  . fluticasone (FLONASE) 50 MCG/ACT nasal spray 2 spray  2 spray Each Nare Daily Mansy, Jan A, MD      . guaiFENesin [facility]) 12 hr tablet 600 mg  600 mg Oral BID PRN Mansy, Jan A, MD      . levETIRAcetam (KEPPRA) tablet 1,000 mg  1,000 mg Oral Daily Mansy, Jan A, MD   1,000 mg at 03/24/20 1245  . levETIRAcetam (KEPPRA) tablet 1,500 mg  1,500 mg Oral QPM Mansy, Jan A, MD   1,500 mg at 03/24/20 1711  . loratadine (CLARITIN) tablet 10 mg  10 mg Oral Daily Mansy, Jan A, MD   10 mg at 03/25/20 1031  . magnesium hydroxide (MILK OF MAGNESIA) suspension 30 mL  30 mL Oral Daily PRN Mansy, Jan A, MD      . meclizine (ANTIVERT) tablet 25 mg  25 mg Oral TID PRN Mansy, Jan A, MD      . midodrine (PROAMATINE) tablet 10 mg  10 mg Oral TID WC Mansy, Jan A, MD   10 mg at 03/25/20 1129  . multivitamin with minerals tablet 1 tablet  1 tablet Oral Daily Mansy, Jan A, MD   1 tablet at 03/24/20 0940  . ondansetron (ZOFRAN) tablet 4 mg  4 mg Oral Q6H PRN Mansy, Jan A, MD       Or  . ondansetron [facility]) injection 4 mg  4 mg Intravenous Q6H PRN Mansy, Jan A, MD      . pantoprazole (PROTONIX) EC tablet 40 mg  40 mg Oral Daily Mansy, Jan A, MD   40 mg at 03/25/20 1032  . psyllium (HYDROCIL/METAMUCIL)   Oral Daily Mansy, Vernetta Honey, MD   Given at 03/24/20 [phone]  . risperiDONE (RISPERDAL) tablet 1 mg  1 mg Oral Daily Mansy, Jan A, MD   1 mg at 03/25/20 1033  . risperiDONE (RISPERDAL) tablet 2 mg  2 mg Oral QHS Mansy, Jan A, MD   2 mg at 03/24/20 2117  . traZODone (DESYREL) tablet 25 mg  25 mg Oral QHS PRN Mansy, Jan A, MD      . vitamin B-12 (CYANOCOBALAMIN) tablet 1,000 mcg  1,000 mcg Oral Daily Mansy, Jan A, MD    1,000 mcg at 03/25/20 1034  Discharge Medications: Please see discharge summary for a list of discharge medications.  Relevant Imaging Results:  Relevant Lab Results:   Additional Information SSN:[id number]  Reuel Boom Landri Dorsainvil, LCSW

## 2020-03-25 NOTE — TOC Initial Note (Signed)
Transition of Care [facility]) - Initial/Assessment Note    Patient Details  Name: Raymond [name] MRN: [id number] Date of Birth: 09/25/1960  Transition of Care [facility]) CM/SW Contact:    Maree Krabbe, LCSW Phone Number: 03/25/2020, 12:35 PM  Clinical Narrative:    Pt has a legal guardian. CSW called Lashae and left voicemail. CSW spoke with [name] at [facility] and he states he just spoke with Annetta Maw about the plan. [name] would prefer pt go to [facility] or [facility]--Dale states Annetta Maw would agree. CSW has sent referral. [name] also told Annetta Maw to give CSW a call. CSW is awaiting Lashae's call.           Expected Discharge Plan: Skilled Nursing Facility Barriers to Discharge: Continued Medical Work up   Patient Goals and CMS Choice Patient states their goals for this hospitalization and ongoing recovery are:: to get better   Choice offered to / list presented to : [facility] POA / Guardian  Expected Discharge Plan and Services Expected Discharge Plan: Skilled Nursing Facility In-house Referral: Clinical Social Work   Post Acute Care Choice: Skilled Nursing Facility Living arrangements for the past 2 months: Group Home                                      Prior Living Arrangements/Services Living arrangements for the past 2 months: Group Home Lives with:: Roommate Patient language and need for interpreter reviewed:: Yes Do you feel safe going back to the place where you live?: Yes      Need for Family Participation in Patient Care: Yes (Comment) Care giver support system in place?: Yes (comment)   Criminal Activity/Legal Involvement Pertinent to Current Situation/Hospitalization: No - Comment as needed  Activities of Daily Living      Permission Sought/Granted Permission sought to share information with : Guardian Permission granted to share information with : Yes, Release of Information Signed  Share Information with NAME: Severiano Gilbert  Permission granted to share info w AGENCY:  [facility], [facility]  Permission granted to share info w Relationship: guardian     Emotional Assessment Appearance:: Appears stated age Attitude/Demeanor/Rapport: Unable to Assess Affect (typically observed): Unable to Assess Orientation: : Oriented to Self Alcohol / Substance Use: Not Applicable Psych Involvement: No (comment)  Admission diagnosis:  Weakness [R53.1] Hypoxia [R09.02] Generalized weakness [R53.1] Patient Active Problem List   Diagnosis Date Noted  . Protein-calorie malnutrition, severe 03/05/2020  . Acute kidney injury superimposed on CKD (HCC)   . Goals of care, counseling/discussion   . Palliative care by specialist   . DNR (do not resuscitate) discussion   . Acute on chronic diastolic (congestive) heart failure (HCC) 03/02/2020  . Hypotension   . Mitral valve insufficiency   . Acute on chronic diastolic CHF (congestive heart failure) (HCC) 03/01/2020  . HTN (hypertension) 03/01/2020  . GERD (gastroesophageal reflux disease) 03/01/2020  . New onset atrial fibrillation (HCC) 03/01/2020  . Atrial fibrillation with RVR (HCC) 03/01/2020  . Seizure (HCC)   . Nose fracture 04/09/2016  . Weakness 07/11/2015  . Epileptic seizures (HCC) 01/12/2015  . Intellectual disability   . OCD (obsessive compulsive disorder)   . Hypertriglyceridemia   . Epilepsy (HCC)   . Systolic murmur    PCP:  [name] Pharmacy:   [facility] - [place], [place] - [address]  [place] [address] Phone: [phone] Fax:  [phone]     Social Determinants of Health (SDOH) Interventions    Readmission Risk Interventions No flowsheet data found.

## 2020-03-25 NOTE — Progress Notes (Signed)
PROGRESS NOTE  Raymond [name] YWV:[id number] DOB: November 06, 1960   PCP: [name]  Patient is from: ALF  DOA: 03/23/2020 LOS: 2  Brief Narrative / Interim history: 59 year old male with history of seizure disorder, intellectual disability, OCD, severe MVR, systolic CHF, A. Fib not on anticoagulation, hypertension and recent hospitalization from 8/2-8/12 for seizure, CHF and A. fib with RVR who presented from ALF with generalized weakness.   ED, hemodynamically stable.  Afebrile.  95% on room air.  Reportedly transient desaturation.  CBC and BMP without significant finding other than mild thrombocytopenia to 134.  High-sensitivity troponin negative x2.  Lactic acid negative x2.  BNP elevated to 414.  Ammonia 34.  Digoxin level subtherapeutic at 0.4.  Valproic acid subtherapeutic at 22.  UA without significant finding.  COVID-19 PCR negative.  CXR, CT head without contrast and CTA chest without acute or significant finding.  Patient was admitted for generalized weakness and transient hypoxia with exertion.  Subjective: Seen and examined earlier this morning and this afternoon.  Patient was sleepy this morning but wakes to voice with tactile stimuli and voice.  Follows some commands.  No apparent focal neuro deficit.  Responds yes and no to questions.  He is more awake this morning but appears lethargic.   Objective: Vitals:   03/25/20 0609 03/25/20 0700 03/25/20 0843 03/25/20 1118  BP: 100/73  94/67 96/63  Pulse: 95  88 98  Resp: 18  16   Temp: 97.8 F (36.6 C)  97.9 F (36.6 C) 98.6 F (37 C)  TempSrc:   Oral Oral  SpO2: 97%  96% 94%  Weight:  57.9 kg    Height:        Intake/Output Summary (Last 24 hours) at 03/25/2020 1530 Last data filed at 03/25/2020 1500 Gross per 24 hour  Intake 1600 ml  Output --  Net 1600 ml   Filed Weights   03/23/20 1303 03/25/20 0700  Weight: 58.2 kg 57.9 kg    Examination:  GENERAL: No apparent distress but appears sleepy and  lethargic HEENT: MMM.  Vision and hearing grossly intact.  NECK: Supple.  No apparent JVD.  RESP:  No IWOB.  Fair aeration bilaterally. CVS:  RRR. Heart sounds normal.  ABD/GI/GU: BS+. Abd soft, NTND.  MSK/EXT:  Moves extremities.  Significant muscle mass and subcu fat loss. SKIN: no apparent skin lesion or wound NEURO: Sleepy but wakes to voice easily.  Only responds yes or no to questions (reportedly baseline).  Follows some commands.  No facial asymmetry.  Moves all extremities. PSYCH: Calm.  No distress or agitation.  Procedures:  None  Microbiology summarized: None  Assessment & Plan: Hypotension: Chronic issue.  Recently discharged on midodrine. No signs of infection. -Discontinued home losartan -Continue home midodrine 10 mg 3 times daily -Start gentle IV fluid at 60 cc an hour.  -Random cortisol 14.5.  We will check ACTH stimulation test. -Check orthostatic vitals if able to stand.  Generalized weakness/somnolence: No apparent focal neuro deficit to suggest CVA.  Doubt infectious process without fever or leukocytosis. He is on Keppra (2500 mg daily), risperidone and meclizine which could contribute.  Also elevated ammonia likely due to Depakote.  Might have been dehydrated prior to arrival.  Both TSH and free T4 slightly elevated suggesting euthyroid sick syndrome likely from amiodarone. -ACTH stimulation test as above -Continue current dose of AEDs per neurology. -Ordered EEG per neuro recommendation. -Reduced to 0.5 mg in the morning and 1 mg at bedtime. -Start lactulose  for ammonia -PT/OT eval. -Gentle IV fluid as above  Chronic systolic CHF: Echo on 03/01/2020 with EF of 40 to 45%, global hypokinesis, moderate LAE and moderate to severe MVR.  Appears euvolemic.  Not on diuretics.  BNP 414> 600 which is about his baseline.  CXR without acute finding.  Clinically appears hypovolemic.  -Monitor fluid status -Hold home losartan in the setting of hypotension  Persistent  atrial fibrillation: Rate controlled.  CHA2DS2-VASc score 1 for CHF.  Recently discharged on amiodarone, Coreg and digoxin.  Not on anticoagulation.  Digoxin level subtherapeutic at 0.4.  Noncompliance? -Continue amiodarone and digoxin  Moderate care severe mitral valve insufficiency-evaluated previous hospitalization and deemed to be not a surgical candidate.  History of seizure disorder: Recently hospitalized and discharged on Keppra, and Depakote.  Depakote level subtherapeutic. -Discussed with neurology who recommended continuing current dose of AEDs and getting an EEG. -Continue home Keppra 1000 mg in the morning and 1500 mg in the evening -Continue home Depakote 500 mg 3 times daily. -Spot EEG  Intellectual disability -Supportive care.  Vitamin B12 deficiency: Vitamin B12 high  Severe malnutrition: As evidenced by some muscle mass and subcu fat loss.  He had about 22 pound weight loss in the last year but stable since last hospitalization earlier this month. Body mass index is 17.31 kg/m.  Consulted dietitian      Wt Readings from Last 10 Encounters:  03/25/20 57.9 kg  03/22/20 58.2 kg  03/09/20 58.2 kg  03/23/19 68 kg  12/19/17 63.9 kg  11/12/17 65.2 kg  09/19/17 65.3 kg  02/11/17 62.4 kg  09/24/16 62.9 kg  08/09/16 66.7 kg     DVT prophylaxis:  enoxaparin (LOVENOX) injection 40 mg Start: 03/24/20 0130  Code Status: Full code Family Communication: Updated patient's guardian on 8/26. Status is: Inpatient  Remains inpatient appropriate because:Hemodynamically unstable, IV treatments appropriate due to intensity of illness or inability to take PO and Inpatient level of care appropriate due to severity of illness   Dispo: The patient is from: ALF              Anticipated d/c is to: ALF              Anticipated d/c date is: 2 days              Patient currently is not medically stable to d/c.       Consultants:  Neurology   Sch Meds:  Scheduled Meds: .  amiodarone  200 mg Oral BID  . aspirin EC  81 mg Oral Daily  . cholecalciferol  2,000 Units Oral Daily  . [START ON 03/26/2020] cosyntropin  0.25 mg Intravenous Once  . digoxin  0.0625 mg Oral Daily  . divalproex  500 mg Oral TID WC  . enoxaparin (LOVENOX) injection  40 mg Subcutaneous Q24H  . famotidine  40 mg Oral QHS  . feeding supplement (ENSURE ENLIVE)  270 mL Oral Daily  . fenofibrate  160 mg Oral Daily  . fluticasone  2 spray Each Nare Daily  . lactulose  20 g Oral BID   Or  . lactulose  300 mL Rectal BID  . levETIRAcetam  1,000 mg Oral Daily  . levETIRAcetam  1,500 mg Oral QPM  . loratadine  10 mg Oral Daily  . midodrine  10 mg Oral TID WC  . multivitamin with minerals  1 tablet Oral Daily  . pantoprazole  40 mg Oral Daily  . psyllium   Oral Daily  .  risperiDONE  1 mg Oral Daily  . risperiDONE  2 mg Oral QHS  . vitamin B-12  1,000 mcg Oral Daily   Continuous Infusions: PRN Meds:.acetaminophen **OR** acetaminophen, albuterol, guaiFENesin, magnesium hydroxide, meclizine, ondansetron **OR** ondansetron (ZOFRAN) IV, traZODone  Antimicrobials: Anti-infectives (From admission, onward)   None       I have personally reviewed the following labs and images: CBC: Recent Labs  Lab 03/22/20 1140 03/23/20 1334 03/24/20 0757 03/25/20 0552  WBC 4.6 5.2 6.2 4.8  HGB 10.6* 11.9* 11.9* 11.3*  HCT 32.8* 36.1* 34.5* 32.8*  MCV 97.9 96.0 92.7 92.7  PLT 133* 134* 132* 121*   BMP &GFR Recent Labs  Lab 03/22/20 1140 03/23/20 1334 03/24/20 0757 03/25/20 0552  NA 134* 137 137 139  K 3.8 3.9 3.7 4.0  CL 106 106 103 104  CO2 17* 22 22 24   GLUCOSE 85 116* 81 92  BUN 13 15 14 13   CREATININE 1.22 1.24 0.99 1.10  CALCIUM 8.1* 8.7* 8.8* 8.5*  MG  --   --   --  1.9  PHOS  --   --   --  3.3   Estimated Creatinine Clearance: 60.7 mL/min (by C-G formula based on SCr of 1.1 mg/dL). Liver & Pancreas: Recent Labs  Lab 03/25/20 0552  ALBUMIN 3.1*   No results for input(s):  LIPASE, AMYLASE in the last 168 hours. Recent Labs  Lab 03/23/20 2200 03/25/20 1109  AMMONIA 34 69*   Diabetic: No results for input(s): HGBA1C in the last 72 hours. No results for input(s): GLUCAP in the last 168 hours. Cardiac Enzymes: No results for input(s): CKTOTAL, CKMB, CKMBINDEX, TROPONINI in the last 168 hours. No results for input(s): PROBNP in the last 8760 hours. Coagulation Profile: No results for input(s): INR, PROTIME in the last 168 hours. Thyroid Function Tests: Recent Labs    03/24/20 0757 03/25/20 1109  TSH 6.860*  --   FREET4  --  1.62*   Lipid Profile: No results for input(s): CHOL, HDL, LDLCALC, TRIG, CHOLHDL, LDLDIRECT in the last 72 hours. Anemia Panel: Recent Labs    03/25/20 1109  VITAMINB12 2,204*   Urine analysis:    Component Value Date/Time   COLORURINE YELLOW (A) 03/22/2020 1551   APPEARANCEUR CLEAR (A) 03/22/2020 1551   APPEARANCEUR Clear 12/19/2017 1424   LABSPEC 1.009 03/22/2020 1551   PHURINE 5.0 03/22/2020 1551   GLUCOSEU NEGATIVE 03/22/2020 1551   HGBUR NEGATIVE 03/22/2020 1551   BILIRUBINUR NEGATIVE 03/22/2020 1551   BILIRUBINUR Negative 12/19/2017 1424   KETONESUR NEGATIVE 03/22/2020 1551   PROTEINUR NEGATIVE 03/22/2020 1551   NITRITE NEGATIVE 03/22/2020 1551   LEUKOCYTESUR NEGATIVE 03/22/2020 1551   Sepsis Labs: Invalid input(s): PROCALCITONIN, LACTICIDVEN  Microbiology: Recent Results (from the past 240 hour(s))  SARS Coronavirus 2 by RT PCR (hospital order[facility]) Nasopharyngeal Nasopharyngeal Swab     Status: None   Collection Time: 03/22/20  1:56 PM   Specimen: Nasopharyngeal Swab  Result Value Ref Range Status   SARS Coronavirus 2 NEGATIVE NEGATIVE Final    Comment: (NOTE) SARS-CoV-2 target nucleic acids are NOT DETECTED.  The SARS-CoV-2 RNA is generally detectable in upper and lower respiratory specimens during the acute phase of infection. The lowest concentration of  SARS-CoV-2 viral copies this assay can detect is 250 copies / mL. A negative result does not preclude SARS-CoV-2 infection and should not be used as the sole basis for treatment or other patient management decisions.  A negative  result may occur with improper specimen collection / handling, submission of specimen other than nasopharyngeal swab, presence of viral mutation(s) within the areas targeted by this assay, and inadequate number of viral copies (<250 copies / mL). A negative result must be combined with clinical observations, patient history, and epidemiological information.  Fact Sheet for Patients:   BoilerBrush.com.cy  Fact Sheet for Healthcare Providers: https://pope.com/  This test is not yet approved or  cleared by the [place] FDA and has been authorized for detection and/or diagnosis of SARS-CoV-2 by FDA under an Emergency Use Authorization (EUA).  This EUA will remain in effect (meaning this test can be used) for the duration of the COVID-19 declaration under Section 564(b)(1) of the Act, 21 U.S.C. section 360bbb-3(b)(1), unless the authorization is terminated or revoked sooner.  Performed at [facility], [address], [place], [place] [address]     Radiology Studies: No results found.   Milianna Ericsson T. Chemere Steffler [facility]  If 7PM-7AM, please contact night-coverage [email or web address] 03/25/2020, 3:30 PM

## 2020-03-26 ENCOUNTER — Inpatient Hospital Stay: Payer: Medicare Other

## 2020-03-26 DIAGNOSIS — D649 Anemia, unspecified: Secondary | ICD-10-CM

## 2020-03-26 DIAGNOSIS — D696 Thrombocytopenia, unspecified: Secondary | ICD-10-CM

## 2020-03-26 LAB — CBC
HCT: 33.2 % — ABNORMAL LOW (ref 39.0–52.0)
Hemoglobin: 11 g/dL — ABNORMAL LOW (ref 13.0–17.0)
MCH: 31.5 pg (ref 26.0–34.0)
MCHC: 33.1 g/dL (ref 30.0–36.0)
MCV: 95.1 fL (ref 80.0–100.0)
Platelets: 120 10*3/uL — ABNORMAL LOW (ref 150–400)
RBC: 3.49 MIL/uL — ABNORMAL LOW (ref 4.22–5.81)
RDW: 15.2 % (ref 11.5–15.5)
WBC: 4.8 10*3/uL (ref 4.0–10.5)
nRBC: 0 % (ref 0.0–0.2)

## 2020-03-26 LAB — RENAL FUNCTION PANEL
Albumin: 3 g/dL — ABNORMAL LOW (ref 3.5–5.0)
Anion gap: 6 (ref 5–15)
BUN: 10 mg/dL (ref 6–20)
CO2: 23 mmol/L (ref 22–32)
Calcium: 8.1 mg/dL — ABNORMAL LOW (ref 8.9–10.3)
Chloride: 108 mmol/L (ref 98–111)
Creatinine, Ser: 0.99 mg/dL (ref 0.61–1.24)
GFR calc Af Amer: >60 mL/min (ref >60)
GFR calc non Af Amer: >60 mL/min (ref >60)
Glucose, Bld: 93 mg/dL (ref 70–99)
Phosphorus: 3.6 mg/dL (ref 2.5–4.6)
Potassium: 4.3 mmol/L (ref 3.5–5.1)
Sodium: 137 mmol/L (ref 135–145)

## 2020-03-26 LAB — ACTH: C206 ACTH: 32.8 pg/mL (ref 7.2–63.3)

## 2020-03-26 LAB — AMMONIA: Ammonia: 60 umol/L — ABNORMAL HIGH (ref 9–35)

## 2020-03-26 LAB — MAGNESIUM: Magnesium: 1.8 mg/dL (ref 1.7–2.4)

## 2020-03-26 MED ORDER — LORAZEPAM 2 MG/ML IJ SOLN
1.0000 mg | Freq: Once | INTRAMUSCULAR | Status: AC | PRN
  Administered 2020-03-26: 1 mg via INTRAVENOUS
  Filled 2020-03-26: qty 1

## 2020-03-26 MED ORDER — GADOBUTROL 1 MMOL/ML IV SOLN
6.0000 mL | Freq: Once | INTRAVENOUS | Status: AC | PRN
  Administered 2020-03-26: 6 mL via INTRAVENOUS

## 2020-03-26 NOTE — Progress Notes (Signed)
Patient has a yellow MEWS for low blood pressure. However, patient has baseline hypotension. B/p is now 95/66. Will continue to monitor
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
## 2020-03-26 NOTE — Progress Notes (Signed)
Physical Therapy Evaluation Patient Details Name: Raymond [name] MRN: [id number] DOB: 07/27/1961 Today's Date: 03/26/2020   History of Present Illness  Raymond [name]  is a 59 y.o. Caucasian male with a known history of seizure disorder, mental retardation, obsessive-compulsive disorder and severe mitral regurgitation, presented to the emergency room with acute onset of generalized weakness.  The patient denied any dyspnea at rest, cough or wheezing.  No chest pain or palpitations.  No nausea or vomiting or abdominal pain.  No dysuria, oliguria or hematuria urgency or frequency or flank pain. Upon presentation to the emergency room, vital signs were within normal.  Pulse oximetry dropped when the patient stood up.  Labs revealed a creatinine of 1.24 post 2 previous levels with a BUN of 15 ammonia level of 34 and BNP 414 with high-sensitivity troponin I of 11 lactic acid 1.2.  CBC showed mild anemia better than previous levels.  Chest x-ray showed no acute cardiopulmonary disease.  EKG showed atrial fibrillation with controlled ventricular response of 88. Raymond [name] is now admitted for hypotension which is a chronic problem for him in additional to generalized weakness and somnolence.   Clinical Impression  Raymond [name] admitted with above diagnosis. Raymond [name] currently with functional limitations due to the deficits listed below (see Raymond [name] Problem List). Raymond [name] with minimal verbalization during Raymond [name] evaluation. He does provide better responses with head nods to yes/no questions. He is able to confirm his name verbally and location with head nods. Cannot accurately identify the year/date or situation. He does follow simple commands properly approximately 75% of the time. No family/caregiver present to supplement history so most of history obtained from medical record. Raymond [name] requiring minA+1 for legs with supine<>sit bed mobility transfers. Once he is sitting upright at EOB he falls backwards continually requiring minA+1 to remain upright in  sitting. Raymond [name] requires minA+1 for hand placement and physical assist during sit to stand transfers. Once standing he supports himself with the back of his legs on the bed. MinA+1 requiring from therapist to maintain standing balance in addition to BUE support on walker. Attempted marching in place however Raymond [name] falling backwards onto bed. He is unsafe to attempt ambulation at this time. Orthostatic vitals obtained during mobility and Raymond [name] is hypotensive in supine which only worsens during position changes. Supine: BP: 93/63, HR: 108, Sitting: BP: 81/70, HR: 120, Standing: BP: 71/61, HR: 140. Raymond [name] unable to provide any verbal feedback as to whether he is symptomatic during position changes. Raymond [name] is not safe to discharge back to his group home and will need SNF placement. Raymond [name] will benefit from Raymond [name] services to address deficits in strength, balance, and mobility in order to transition back to group home after SNF placement.    Follow Up Recommendations SNF    Equipment Recommendations  None recommended by Raymond [name];Other (comment) (TBD at next venue)    Recommendations for Other Services       Precautions / Restrictions Precautions Precautions: Fall Precaution Comments: Monitor BP Restrictions Weight Bearing Restrictions: No      Mobility  Bed Mobility Overal bed mobility: Needs Assistance Bed Mobility: Supine to Sit;Sit to Supine     Supine to sit: Min assist Sit to supine: Min assist   General bed mobility comments: Raymond [name] requiring minA+1 for legs with supine<>sit transfers. Once he is sitting upright at EOB he falls backwards continually requiring minA+1 to remain upright in sitting  Transfers Overall transfer level: Needs assistance Equipment used: Rolling walker (2 wheeled) Transfers: Sit to/from Stand Sit to Stand:  Min assist         General transfer comment: Raymond [name] requires minA+1 for hand placement and physical assist during transfers. Once standing he supports himself with the back of his legs on  the bed. MinA+1 requiring from therapist to maintain standing balance in addition to BUE support on walker. Attempted marching in place however Raymond [name] falling backwards onto bed. He is unsafe to attempt ambulation at this time.   Ambulation/Gait                Stairs            Wheelchair Mobility    Modified Rankin (Stroke Patients Only)       Balance Overall balance assessment: Needs assistance Sitting-balance support: Feet supported;Bilateral upper extremity supported Sitting balance-Leahy Scale: Poor Sitting balance - Comments: Raymond [name] requiring minA+1 to maintaing upright sitting balance. He continually falls backwards in sitting with poor righting reactions   Standing balance support: Bilateral upper extremity supported Standing balance-Leahy Scale: Poor Standing balance comment: Raymond [name] requiring minA+1 to maintain standing balance during continual posterior LOB                             Pertinent Vitals/Pain Pain Assessment: Faces Faces Pain Scale: No hurt    Home Living Family/patient expects to be discharged to:: Unsure                 Additional Comments: Raymond [name] is from a group home per medical records    Prior Function Level of Independence: Needs assistance   Gait / Transfers Assistance Needed: Per medical record Raymond [name] walks "sometimes", chart suggests that he may occasionally require assistance. Raymond [name] is unable to provide additional history at this time and no caregivers are present.  ADL's / Homemaking Assistance Needed: Per medical record Raymond [name] requires assist for ADL, unclear on level of assist        Hand Dominance   Dominant Hand:  (Unsure, Raymond [name] unable to provide information)    Extremity/Trunk Assessment   Upper Extremity Assessment Upper Extremity Assessment: Generalized weakness    Lower Extremity Assessment Lower Extremity Assessment: Generalized weakness       Communication   Communication: Other (comment) (Minimal verbalization  from patient, does not head sometimes)  Cognition Arousal/Alertness: Awake/alert Behavior During Therapy: Flat affect Overall Cognitive Status: No family/caregiver present to determine baseline cognitive functioning                                 General Comments: Raymond [name] with minimal verbalization. He does provide better responses with head nods to yes/no questions. He is able to confirm his name verbally and location with head nods. Cannot accurately identify the year/date or situation. He does follow simple commands properly approximately 75% of the time      General Comments      Exercises     Assessment/Plan    Raymond [name] Assessment Patient needs continued Raymond [name] services  Raymond [name] Problem List Decreased strength;Decreased activity tolerance;Decreased balance;Decreased mobility;Decreased cognition;Decreased safety awareness;Decreased knowledge of precautions       Raymond [name] Treatment Interventions DME instruction;Gait training;Stair training;Functional mobility training;Therapeutic activities;Therapeutic exercise;Balance training;Patient/family education;Neuromuscular re-education;Cognitive remediation    Raymond [name] Goals (Current goals can be found in the Care Plan section)  Acute Rehab Raymond [name] Goals Patient Stated Goal: none, Raymond [name] unable to state Raymond [name] Goal Formulation: Patient unable to participate in goal setting  Frequency Min 2X/week   Barriers to discharge        Co-evaluation               AM-PAC Raymond [name] "6 Clicks" Mobility  Outcome Measure Help needed turning from your back to your side while in a flat bed without using bedrails?: A Little Help needed moving from lying on your back to sitting on the side of a flat bed without using bedrails?: A Little Help needed moving to and from a bed to a chair (including a wheelchair)?: A Lot Help needed standing up from a chair using your arms (e.g., wheelchair or bedside chair)?: A Little Help needed to walk in hospital room?: Total Help needed  climbing 3-5 steps with a railing? : Total 6 Click Score: 13    End of Session Equipment Utilized During Treatment: Gait belt Activity Tolerance: Treatment limited secondary to medical complications (Comment);Other (comment) (Limited by hypotension) Patient left: in bed;with call bell/phone within reach;with bed alarm set Nurse Communication: Other (comment) (Low BP) Raymond [name] Visit Diagnosis: Muscle weakness (generalized) (M62.81);Unsteadiness on feet (R26.81);Difficulty in walking, not elsewhere classified (R26.2)    Time: 9628-3662 Raymond [name] Time Calculation (min) (ACUTE ONLY): 22 min   Charges:   Raymond [name] Evaluation $Raymond [name] Eval Moderate Complexity: 1 Mod          Geanie Pacifico D Shawnise Peterkin Raymond [name], DPT, GCS   Edin Skarda 03/26/2020, 10:51 AM

## 2020-03-26 NOTE — Progress Notes (Signed)
PROGRESS NOTE  Raymond [name] ZDG:[id number] DOB: [birth year]   PCP: [name]  Patient is from: ALF  DOA: 03/23/2020 LOS: 3  Brief Narrative / Interim history: 59 year old male with history of seizure disorder, intellectual disability, OCD, severe MVR, systolic CHF, A. Fib not on anticoagulation, hypertension and recent hospitalization from 8/2-8/12 for seizure, CHF and A. fib with RVR who presented from ALF with generalized weakness.   ED, hemodynamically stable.  Afebrile.  95% on room air.  Reportedly transient desaturation.  CBC and BMP without significant finding other than mild thrombocytopenia to 134.  High-sensitivity troponin negative x2.  Lactic acid negative x2.  BNP elevated to 414.  Ammonia 34.  Digoxin level subtherapeutic at 0.4.  Valproic acid subtherapeutic at 22.  UA without significant finding.  COVID-19 PCR negative.  CXR, CT head without contrast and CTA chest without acute or significant finding.  Patient was admitted for generalized weakness and transient hypoxia.   Hospital course complicated by encephalopathy, acute on chronic hypotension and thrombocytopenia.  Neurology consulted and ordered MRI brain and suggested palliative medicine consultation.  Encephalopathy seems to have improved.  If MRI unrevealing, could be discharged to SNF when bed is available.   Subjective: Seen and examined earlier this morning and this afternoon.  Patient was sleepy this morning but wakes to voice with tactile stimuli and voice.  Follows some commands.  No apparent focal neuro deficit.  Responds yes and no to questions.  He is more awake this morning but appears lethargic.   Objective: Vitals:   03/26/20 0738 03/26/20 0844 03/26/20 1208 03/26/20 1300  BP: 92/71  (!) 87/66 95/66  Pulse: 99 75 91 84  Resp: 16  18 12   Temp: 97.9 F (36.6 C)     TempSrc:      SpO2: 95%  96%   Weight:      Height:        Intake/Output Summary (Last 24 hours) at 03/26/2020 1427 Last  data filed at 03/26/2020 1000 Gross per 24 hour  Intake 349.1 ml  Output 1050 ml  Net -700.9 ml   Filed Weights   03/23/20 1303 03/25/20 0700 03/26/20 0438  Weight: 58.2 kg 57.9 kg 56.7 kg    Examination:  GENERAL: No apparent distress.  Nontoxic. HEENT: MMM.  Vision and hearing grossly intact.  NECK: Supple.  No apparent JVD.  RESP: On RA.  No IWOB.  Fair aeration bilaterally. CVS: Irregular rhythm.  Normal rate.03/28/20 Heart sounds normal.  ABD/GI/GU: BS+. Abd soft, NTND.  MSK/EXT:  Moves extremities. No apparent deformity. No edema.  SKIN: no apparent skin lesion or wound NEURO: Awake, alert.  Oriented to self.  Follows commands.  No apparent new focal neuro deficit. PSYCH: Calm.  No distress or agitation.  Procedures:  None  Microbiology summarized: None  Assessment & Plan: Acute on chronic hypotension: Stable. -Discontinued home losartan -Continue home midodrine 10 mg 3 times daily -Continue IV fluid at 75 cc an hour for the next 24 hours. -Random cortisol 14.5.  ACTH stimulation test was not done earlier this morning. -Check orthostatic vitals if able to stand.  Generalized weakness/somnolence: No apparent focal neuro deficit to suggest CVA.  Doubt infectious process without fever or leukocytosis. He is on Keppra (2500 mg daily), risperidone and meclizine which could contribute.  Also elevated ammonia likely due to Depakote.  Might have been dehydrated prior to arrival.  Both TSH and free T4 slightly elevated suggesting euthyroid sick syndrome likely from amiodarone.  Overall,  encephalopathy seems to have resolved.  He is alert and awake this morning. -Agree with MRI brain per neurology -Continue reduced dose of home risperidone -Continue lactulose -PT/OT eval. -Gentle IV fluid as above  Chronic systolic CHF: Echo on 03/01/2020 with EF of 40 to 45%, global hypokinesis, moderate LAE and moderate to severe MVR.  Appears euvolemic.  Not on diuretics.  BNP 414> 600 which is  about his baseline.  CXR without acute finding.  Was somewhat hypovolemic on presentation.  Appears euvolemic now. -Monitor fluid status while on IV fluid.  Persistent atrial fibrillation: Rate controlled.  CHA2DS2-VASc score 1 for CHF.  Recently discharged on amiodarone, Coreg and digoxin.  Not on anticoagulation.  Digoxin level subtherapeutic at 0.4.  Noncompliance? -Continue amiodarone and digoxin -Recheck digoxin level in the morning  Moderate care severe mitral valve insufficiency-evaluated previous hospitalization and deemed to be not a surgical candidate.  History of seizure disorder: Recently hospitalized and discharged on Keppra, and Depakote.  Depakote level subtherapeutic. -Discussed with neurology who recommended continuing current dose of AEDs and getting an EEG. -Continue home Keppra 1000 mg in the morning and 1500 mg in the evening -Continue home Depakote 500 mg 3 times daily.  Normocytic anemia: Stable. -Check anemia panel  Intellectual disability -Supportive care.  Vitamin B12 deficiency: Vitamin B12 high  Thrombocytopenia: Dropped from mid 200s to 120's but seems to be stable now.  Had values about 150 previous hospitalization. -Will monitor closely. HIT labs if it drops again.  -Check anemia panel  Severe malnutrition: As evidenced by some muscle mass and subcu fat loss.  He had about 22 pound weight loss in the last year but stable since last hospitalization earlier this month. Body mass index is 16.95 kg/m.  Consulted dietitian      Wt Readings from Last 10 Encounters:  03/26/20 56.7 kg  03/22/20 58.2 kg  03/09/20 58.2 kg  03/23/19 68 kg  12/19/17 63.9 kg  11/12/17 65.2 kg  09/19/17 65.3 kg  02/11/17 62.4 kg  09/24/16 62.9 kg  08/09/16 66.7 kg     DVT prophylaxis:  enoxaparin (LOVENOX) injection 40 mg Start: 03/24/20 0130  Code Status: Full code Family Communication: Updated patient's guardian on 8/26. Status is: Inpatient  Remains inpatient  appropriate because:Hemodynamically unstable, IV treatments appropriate due to intensity of illness or inability to take PO and Inpatient level of care appropriate due to severity of illness   Dispo: The patient is from: ALF              Anticipated d/c is to: SNF              Anticipated d/c date is: 2 days              Patient currently is not medically stable to d/c.       Consultants:  Neurology Palliative medicine   Sch Meds:  Scheduled Meds: . amiodarone  200 mg Oral BID  . aspirin EC  81 mg Oral Daily  . cholecalciferol  2,000 Units Oral Daily  . digoxin  0.0625 mg Oral Daily  . divalproex  500 mg Oral TID WC  . enoxaparin (LOVENOX) injection  40 mg Subcutaneous Q24H  . famotidine  40 mg Oral QHS  . feeding supplement (ENSURE ENLIVE)  270 mL Oral Daily  . fenofibrate  160 mg Oral Daily  . fluticasone  2 spray Each Nare Daily  . lactulose  20 g Oral BID   Or  . lactulose  300 mL Rectal BID  . levETIRAcetam  1,000 mg Oral Daily  . levETIRAcetam  1,500 mg Oral QPM  . loratadine  10 mg Oral Daily  . midodrine  10 mg Oral TID WC  . multivitamin with minerals  1 tablet Oral Daily  . pantoprazole  40 mg Oral Daily  . psyllium   Oral Daily  . risperiDONE  0.5 mg Oral Daily  . risperiDONE  1 mg Oral QHS  . vitamin B-12  1,000 mcg Oral Daily   Continuous Infusions: . 0.9 % NaCl with KCl 20 mEq / L 75 mL/hr at 03/26/20 0841   PRN Meds:.acetaminophen **OR** acetaminophen, albuterol, guaiFENesin, LORazepam, magnesium hydroxide, ondansetron **OR** ondansetron (ZOFRAN) IV, traZODone  Antimicrobials: Anti-infectives (From admission, onward)   None       I have personally reviewed the following labs and images: CBC: Recent Labs  Lab 03/22/20 1140 03/23/20 1334 03/24/20 0757 03/25/20 0552 03/26/20 0542  WBC 4.6 5.2 6.2 4.8 4.8  HGB 10.6* 11.9* 11.9* 11.3* 11.0*  HCT 32.8* 36.1* 34.5* 32.8* 33.2*  MCV 97.9 96.0 92.7 92.7 95.1  PLT 133* 134* 132* 121* 120*    BMP &GFR Recent Labs  Lab 03/22/20 1140 03/23/20 1334 03/24/20 0757 03/25/20 0552 03/26/20 0542  NA 134* 137 137 139 137  K 3.8 3.9 3.7 4.0 4.3  CL 106 106 103 104 108  CO2 17* 22 22 24 23   GLUCOSE 85 116* 81 92 93  BUN 13 15 14 13 10   CREATININE 1.22 1.24 0.99 1.10 0.99  CALCIUM 8.1* 8.7* 8.8* 8.5* 8.1*  MG  --   --   --  1.9 1.8  PHOS  --   --   --  3.3 3.6   Estimated Creatinine Clearance: 66 mL/min (by C-G formula based on SCr of 0.99 mg/dL). Liver & Pancreas: Recent Labs  Lab 03/25/20 0552 03/26/20 0542  ALBUMIN 3.1* 3.0*   No results for input(s): LIPASE, AMYLASE in the last 168 hours. Recent Labs  Lab 03/23/20 2200 03/25/20 1109 03/26/20 0542  AMMONIA 34 69* 60*   Diabetic: No results for input(s): HGBA1C in the last 72 hours. No results for input(s): GLUCAP in the last 168 hours. Cardiac Enzymes: No results for input(s): CKTOTAL, CKMB, CKMBINDEX, TROPONINI in the last 168 hours. No results for input(s): PROBNP in the last 8760 hours. Coagulation Profile: No results for input(s): INR, PROTIME in the last 168 hours. Thyroid Function Tests: Recent Labs    03/24/20 0757 03/25/20 1109  TSH 6.860*  --   FREET4  --  1.62*   Lipid Profile: No results for input(s): CHOL, HDL, LDLCALC, TRIG, CHOLHDL, LDLDIRECT in the last 72 hours. Anemia Panel: Recent Labs    03/25/20 1109  VITAMINB12 2,204*   Urine analysis:    Component Value Date/Time   COLORURINE YELLOW (A) 03/22/2020 1551   APPEARANCEUR CLEAR (A) 03/22/2020 1551   APPEARANCEUR Clear 12/19/2017 1424   LABSPEC 1.009 03/22/2020 1551   PHURINE 5.0 03/22/2020 1551   GLUCOSEU NEGATIVE 03/22/2020 1551   HGBUR NEGATIVE 03/22/2020 1551   BILIRUBINUR NEGATIVE 03/22/2020 1551   BILIRUBINUR Negative 12/19/2017 1424   KETONESUR NEGATIVE 03/22/2020 1551   PROTEINUR NEGATIVE 03/22/2020 1551   NITRITE NEGATIVE 03/22/2020 1551   LEUKOCYTESUR NEGATIVE 03/22/2020 1551   Sepsis Labs: Invalid input(s):  PROCALCITONIN, LACTICIDVEN  Microbiology: Recent Results (from the past 240 hour(s))  SARS Coronavirus 2 by RT PCR (hospital order, performed in [facility] hospital lab) Nasopharyngeal Nasopharyngeal Swab  Status: None   Collection Time: 03/22/20  1:56 PM   Specimen: Nasopharyngeal Swab  Result Value Ref Range Status   SARS Coronavirus 2 NEGATIVE NEGATIVE Final    Comment: (NOTE) SARS-CoV-2 target nucleic acids are NOT DETECTED.  The SARS-CoV-2 RNA is generally detectable in upper and lower respiratory specimens during the acute phase of infection. The lowest concentration of SARS-CoV-2 viral copies this assay can detect is 250 copies / mL. A negative result does not preclude SARS-CoV-2 infection and should not be used as the sole basis for treatment or other patient management decisions.  A negative result may occur with improper specimen collection / handling, submission of specimen other than nasopharyngeal swab, presence of viral mutation(s) within the areas targeted by this assay, and inadequate number of viral copies (<250 copies / mL). A negative result must be combined with clinical observations, patient history, and epidemiological information.  Fact Sheet for Patients:   BoilerBrush.com.cyhttps://www.fda.gov/media/136312/download  Fact Sheet for Healthcare Providers: https://pope.com/https://www.fda.gov/media/136313/download  This test is not yet approved or  cleared by the [place] FDA and has been authorized for detection and/or diagnosis of SARS-CoV-2 by FDA under an Emergency Use Authorization (EUA).  This EUA will remain in effect (meaning this test can be used) for the duration of the COVID-19 declaration under Section 564(b)(1) of the Act, 21 U.S.C. section 360bbb-3(b)(1), unless the authorization is terminated or revoked sooner.  Performed at [facility], [address] [place], [place] [id number]     Radiology Studies: No results found.   Ayris Carano T. Caralee Morea [facility]  Hospitalist  If 7PM-7AM, please contact night-coverage [email or web address] 03/26/2020, 2:27 PM

## 2020-03-27 DIAGNOSIS — I5022 Chronic systolic (congestive) heart failure: Secondary | ICD-10-CM | POA: Diagnosis present

## 2020-03-27 MED ORDER — SODIUM CHLORIDE 0.9% FLUSH
10.0000 mL | Freq: Two times a day (BID) | INTRAVENOUS | Status: DC
  Administered 2020-03-27 – 2020-03-28 (×3): 10 mL via INTRAVENOUS

## 2020-03-27 NOTE — Plan of Care (Signed)
Patient presently resting in the bed, remains alert to self, able to follow some command, no seizure activity, assisted with meals , VSS, mood calm , no distress noted at this time   Problem: Clinical Measurements: Goal: Will remain free from infection Outcome: Progressing   Problem: Clinical Measurements: Goal: Cardiovascular complication will be avoided Outcome: Progressing   Problem: Nutrition: Goal: Adequate nutrition will be maintained Outcome: Progressing   Problem: Safety: Goal: Ability to remain free from injury will improve Outcome: Progressing

## 2020-03-27 NOTE — Progress Notes (Signed)
PROGRESS NOTE  Raymond [name]:[id number] DOB: Oct 02, 1960   PCP: [name]  Patient is from: ALF  DOA: 03/23/2020 LOS: 4  Brief Narrative / Interim history: 59 year old male with history of seizure disorder, intellectual disability, OCD, severe MVR, systolic CHF, A. Fib not on anticoagulation, hypertension and recent hospitalization from 8/2-8/12 for seizure, CHF and A. fib with RVR who presented from ALF with generalized weakness.   Noteworthy labs included: BNP elevated to 414.  Ammonia 34.  Digoxin level subtherapeutic at 0.4.  Valproic acid subtherapeutic at 22.  UA without significant finding.  COVID-19 PCR negative.  CXR, CT head without contrast and CTA chest without acute or significant finding.  Patient was admitted for generalized weakness and transient hypoxia.   Hospital course complicated by encephalopathy, acute on chronic hypotension and thrombocytopenia.  MRI noting no acute findings.  Neurology consulted and likely felt issues are more chronic in nature.  No new issues.  Recommendation is for patient to transfer to skilled nursing.  Subjective: Patient about the same.  Follows some commands.  Responds to name.  Objective: Vitals:   03/27/20 0833 03/27/20 1200 03/27/20 1635 03/27/20 1637  BP: (!) 84/64 (!) 88/74 (!) 74/54 (!) 86/61  Pulse: 84 97 88 81  Resp: 18 18    Temp: 97.8 F (36.6 C) 98.4 F (36.9 C) 98.5 F (36.9 C)   TempSrc: Oral Oral Oral   SpO2: 96% 95% 97% 95%  Weight:      Height:        Intake/Output Summary (Last 24 hours) at 03/27/2020 1743 Last data filed at 03/27/2020 1345 Gross per 24 hour  Intake 480 ml  Output 600 ml  Net -120 ml   Filed Weights   03/25/20 0700 03/26/20 0438 03/27/20 0504  Weight: 57.9 kg 56.7 kg 57.6 kg    Examination:  GENERAL: Follows some commands.  Responds to name. HEENT: Normocephalic and atraumatic, mucous membranes are moist NECK: No JVD RESP: Relatively clear, some upper airway  noise CVS: Irregular rhythm, rate controlled ABD/GI/GU: Soft, nondistended, hypoactive bowel sounds MSK/EXT: No clubbing or cyanosis or edema SKIN: No skin breaks, tears or lesions PSYCH: Calm.  No evidence of acute psychosis.  Underlying chronic MR  Procedures:  None  Microbiology summarized: None  Assessment & Plan: Acute on chronic hypotension: Stable. -Discontinued home losartan permanently -Continue home midodrine 10 mg 3 times daily -Completed IV fluids -Random cortisol 14.5.  ACTH stimulation test was not done earlier this morning.  Generalized weakness/somnolence: No apparent focal neuro deficit to suggest CVA.  Doubt infectious process without fever or leukocytosis. He is on Keppra (2500 mg daily), risperidone and meclizine which could contribute.  Also elevated ammonia likely due to Depakote.  Might have been dehydrated prior to arrival.  Both TSH and free T4 slightly elevated suggesting euthyroid sick syndrome likely from amiodarone.  Overall, encephalopathy seems to have resolved.  Has been more awake and alert since 8/28. MRI unremarkable. Seems to be responding to decreasing his home Risperdal dose.  No evidence of leptomeningeal carcinomatosis or new cranial nerve deficit as per MRI. -Continue lactulose PT OT recommending skilled nursing when available. -Gentle IV fluid as above  Chronic systolic CHF: Echo on 03/01/2020 with EF of 40 to 45%, global hypokinesis, moderate LAE and moderate to severe MVR.  Appears euvolemic.  Not on diuretics.  BNP 414> 600 which is about his baseline.  CXR without acute finding.  Was somewhat hypovolemic on presentation.  Appears euvolemic now. -Monitor  fluid status while on IV fluid.  Persistent atrial fibrillation: Rate controlled.  CHA2DS2-VASc score 1 for CHF.  Recently discharged on amiodarone, Coreg and digoxin.  Not on anticoagulation.  Digoxin level subtherapeutic at 0.4.  Noncompliance? -Continue amiodarone and digoxin   Moderate  care severe mitral valve insufficiency-evaluated previous hospitalization and deemed to be not a surgical candidate.  History of seizure disorder: Recently hospitalized and discharged on Keppra, and Depakote.  Depakote level subtherapeutic. -Discussed with neurology who recommended continuing current dose of AEDs and getting an EEG. -Continue home Keppra 1000 mg in the morning and 1500 mg in the evening -Continue home Depakote 500 mg 3 times daily.  Normocytic anemia: Stable. -Check anemia panel  Intellectual disability -Supportive care.  Vitamin B12 deficiency: Vitamin B12 high  Thrombocytopenia: Dropped from mid 200s to 120's but seems to be stable now.  Had values about 150 previous hospitalization. -Will monitor closely. HIT labs if it drops again.  -Check anemia panel  Severe malnutrition: As evidenced by some muscle mass and subcu fat loss.  He had about 22 pound weight loss in the last year but stable since last hospitalization earlier this month. Body mass index is 17.22 kg/m.  Consulted dietitian      Wt Readings from Last 10 Encounters:  03/27/20 57.6 kg  03/22/20 58.2 kg  03/09/20 58.2 kg  03/23/19 68 kg  12/19/17 63.9 kg  11/12/17 65.2 kg  09/19/17 65.3 kg  02/11/17 62.4 kg  09/24/16 62.9 kg  08/09/16 66.7 kg     DVT prophylaxis:  enoxaparin (LOVENOX) injection 40 mg Start: 03/24/20 0130  Code Status: Full code Family Communication: Left message for guardian on 8/29 Status is: Inpatient  Remains inpatient appropriate because: Waiting for available bed for skilled nursing   Dispo: The patient is from: ALF              Anticipated d/c is to: SNF, when bed available              Anticipated d/c date is: 8/30        Consultants:  Neurology Palliative medicine   Sch Meds:  Scheduled Meds: . amiodarone  200 mg Oral BID  . aspirin EC  81 mg Oral Daily  . cholecalciferol  2,000 Units Oral Daily  . digoxin  0.0625 mg Oral Daily  . divalproex   500 mg Oral TID WC  . enoxaparin (LOVENOX) injection  40 mg Subcutaneous Q24H  . famotidine  40 mg Oral QHS  . feeding supplement (ENSURE ENLIVE)  270 mL Oral Daily  . fenofibrate  160 mg Oral Daily  . fluticasone  2 spray Each Nare Daily  . lactulose  20 g Oral BID   Or  . lactulose  300 mL Rectal BID  . levETIRAcetam  1,000 mg Oral Daily  . levETIRAcetam  1,500 mg Oral QPM  . loratadine  10 mg Oral Daily  . midodrine  10 mg Oral TID WC  . multivitamin with minerals  1 tablet Oral Daily  . pantoprazole  40 mg Oral Daily  . psyllium   Oral Daily  . risperiDONE  0.5 mg Oral Daily  . risperiDONE  1 mg Oral QHS  . vitamin B-12  1,000 mcg Oral Daily   Continuous Infusions:  PRN Meds:.acetaminophen **OR** acetaminophen, albuterol, guaiFENesin, magnesium hydroxide, ondansetron **OR** ondansetron (ZOFRAN) IV, traZODone  Antimicrobials: Anti-infectives (From admission, onward)   None       I have personally reviewed the following labs  and images: CBC: Recent Labs  Lab 03/22/20 1140 03/23/20 1334 03/24/20 0757 03/25/20 0552 03/26/20 0542  WBC 4.6 5.2 6.2 4.8 4.8  HGB 10.6* 11.9* 11.9* 11.3* 11.0*  HCT 32.8* 36.1* 34.5* 32.8* 33.2*  MCV 97.9 96.0 92.7 92.7 95.1  PLT 133* 134* 132* 121* 120*   BMP &GFR Recent Labs  Lab 03/22/20 1140 03/23/20 1334 03/24/20 0757 03/25/20 0552 03/26/20 0542  NA 134* 137 137 139 137  K 3.8 3.9 3.7 4.0 4.3  CL 106 106 103 104 108  CO2 17* 22 22 24 23   GLUCOSE 85 116* 81 92 93  BUN 13 15 14 13 10   CREATININE 1.22 1.24 0.99 1.10 0.99  CALCIUM 8.1* 8.7* 8.8* 8.5* 8.1*  MG  --   --   --  1.9 1.8  PHOS  --   --   --  3.3 3.6   Estimated Creatinine Clearance: 67.1 mL/min (by C-G formula based on SCr of 0.99 mg/dL). Liver & Pancreas: Recent Labs  Lab 03/25/20 0552 03/26/20 0542  ALBUMIN 3.1* 3.0*   No results for input(s): LIPASE, AMYLASE in the last 168 hours. Recent Labs  Lab 03/23/20 2200 03/25/20 1109 03/26/20 0542  AMMONIA  34 69* 60*   Diabetic: No results for input(s): HGBA1C in the last 72 hours. No results for input(s): GLUCAP in the last 168 hours. Cardiac Enzymes: No results for input(s): CKTOTAL, CKMB, CKMBINDEX, TROPONINI in the last 168 hours. No results for input(s): PROBNP in the last 8760 hours. Coagulation Profile: No results for input(s): INR, PROTIME in the last 168 hours. Thyroid Function Tests: Recent Labs    03/25/20 1109  FREET4 1.62*   Lipid Profile: No results for input(s): CHOL, HDL, LDLCALC, TRIG, CHOLHDL, LDLDIRECT in the last 72 hours. Anemia Panel: Recent Labs    03/25/20 1109  VITAMINB12 2,204*   Urine analysis:    Component Value Date/Time   COLORURINE YELLOW (A) 03/22/2020 1551   APPEARANCEUR CLEAR (A) 03/22/2020 1551   APPEARANCEUR Clear 12/19/2017 1424   LABSPEC 1.009 03/22/2020 1551   PHURINE 5.0 03/22/2020 1551   GLUCOSEU NEGATIVE 03/22/2020 1551   HGBUR NEGATIVE 03/22/2020 1551   BILIRUBINUR NEGATIVE 03/22/2020 1551   BILIRUBINUR Negative 12/19/2017 1424   KETONESUR NEGATIVE 03/22/2020 1551   PROTEINUR NEGATIVE 03/22/2020 1551   NITRITE NEGATIVE 03/22/2020 1551   LEUKOCYTESUR NEGATIVE 03/22/2020 1551   Sepsis Labs: Invalid input(s): PROCALCITONIN, LACTICIDVEN  Microbiology: Recent Results (from the past 240 hour(s))  SARS Coronavirus 2 by RT PCR (hospital order, performed in [facility] hospital lab) Nasopharyngeal Nasopharyngeal Swab     Status: None   Collection Time: 03/22/20  1:56 PM   Specimen: Nasopharyngeal Swab  Result Value Ref Range Status   SARS Coronavirus 2 NEGATIVE NEGATIVE Final    Comment: (NOTE) SARS-CoV-2 target nucleic acids are NOT DETECTED.  The SARS-CoV-2 RNA is generally detectable in upper and lower respiratory specimens during the acute phase of infection. The lowest concentration of SARS-CoV-2 viral copies this assay can detect is 250 copies / mL. A negative result does not preclude SARS-CoV-2 infection and should not  be used as the sole basis for treatment or other patient management decisions.  A negative result may occur with improper specimen collection / handling, submission of specimen other than nasopharyngeal swab, presence of viral mutation(s) within the areas targeted by this assay, and inadequate number of viral copies (<250 copies / mL). A negative result must be combined with clinical observations, patient history, and epidemiological  information.  Fact Sheet for Patients:   BoilerBrush.com.cyhttps://www.fda.gov/media/136312/download  Fact Sheet for Healthcare Providers: https://pope.com/https://www.fda.gov/media/136313/download  This test is not yet approved or  cleared by the [place] FDA and has been authorized for detection and/or diagnosis of SARS-CoV-2 by FDA under an Emergency Use Authorization (EUA).  This EUA will remain in effect (meaning this test can be used) for the duration of the COVID-19 declaration under Section 564(b)(1) of the Act, 21 U.S.C. section 360bbb-3(b)(1), unless the authorization is terminated or revoked sooner.  Performed at [facility], [address] [place], [place] [address]     Radiology Studies: MR BRAIN W WO CONTRAST  Result Date: 03/26/2020 CLINICAL DATA:  Acute neuro deficit.  History of seizure. EXAM: MRI HEAD WITHOUT AND WITH CONTRAST TECHNIQUE: Multiplanar, multiecho pulse sequences of the brain and surrounding structures were obtained without and with intravenous contrast. CONTRAST:  6mL GADAVIST GADOBUTROL 1 MMOL/ML IV SOLN COMPARISON:  CT head 03/23/2020.  MRI head 10/08/2019 FINDINGS: Brain: Moderate atrophy. Moderate ventricular enlargement. No change from 10/08/2019. Negative for acute infarct. Mild white matter changes bilaterally. Negative for hemorrhage or mass Normal enhancement postcontrast administration. Vascular: Normal arterial flow voids Skull and upper cervical spine: No focal skeletal lesion. Sinuses/Orbits: Mild mucosal edema paranasal sinuses.  Negative orbit Other: None IMPRESSION: Moderate atrophy and ventricular enlargement, stable No acute abnormality. Mild white matter changes consistent with microvascular ischemia. Electronically Signed   By: Marlan Palauharles  Clark M.D.   On: 03/26/2020 20:26     Taye T. Gonfa [facility]  If 7PM-7AM, please contact night-coverage [email or web address] 03/27/2020, 5:43 PM

## 2020-03-28 ENCOUNTER — Encounter: Payer: Medicare Other | Admitting: Physical Therapy

## 2020-03-28 DIAGNOSIS — I4891 Unspecified atrial fibrillation: Secondary | ICD-10-CM

## 2020-03-28 MED ORDER — POLYETHYLENE GLYCOL 3350 17 G PO PACK: 17.0000 g | PACK | Freq: Every day | ORAL | Status: DC | PRN

## 2020-03-28 MED ORDER — SENNOSIDES-DOCUSATE SODIUM 8.6-50 MG PO TABS: 2.0000 | ORAL_TABLET | Freq: Every evening | ORAL | Status: DC | PRN

## 2020-03-28 MED ORDER — ENSURE ENLIVE PO LIQD
237.0000 mL | Freq: Three times a day (TID) | ORAL | Status: DC
  Administered 2020-03-29 (×2): 237 mL via ORAL

## 2020-03-28 NOTE — Progress Notes (Signed)
PROGRESS NOTE    Raymond [name]  OVZ:[id number] DOB: [birth year] DOA: 03/23/2020 PCP: [name]   Brief Narrative:  59 year old with history of intellectual disability, seizure disorder, OCD, severe MVR, systolic CHF, A. fib not on anticoagulation, HTN recently admitted here for seizure, CHF, A. fib with RVR presented with generalized weakness.  Most of the work-up including infectious was negative.  CT head, CTA chest, MRI brain were without any acute findings, neurology was consulted and thought his issues were likely chronic in nature.   Assessment & Plan:   Active Problems:   Intellectual disability   Epilepsy (HCC)   Weakness   Atrial fibrillation with RVR (HCC)   Chronic systolic CHF (congestive heart failure) (HCC)   Acute on chronic hypotension: Stable. -Home losartan discontinued, currently on midodrine 10 mg 3 times daily.  Random cortisol level 14.5.  Supportive care.  Generalized weakness/somnolence:  -No focal neuro deficits appreciated.  Elevated ammonia due to Depakote.  TSH/free T4 suggestive of euthyroid sick syndrome.  MRI brain is unremarkable. -Continue lactulose -Risperidone reduced which is helping his mentation.  Currently on 0.5 mg in the morning and 1 mg at bedtime -PT/OT-SNF -Periodic Accu-Cheks.  Currently on heart healthy diet.  Chronic systolic CHF: Echo on 03/01/2020 with EF of 40 to 45%, global hypokinesis, moderate LAE and moderate to severe MVR.    Appears to be euvolemic, not on diuretics.  Persistent atrial fibrillation: Rate controlled.  CHA2DS2-VASc score 1 for CHF.  Recently discharged on amiodarone, Coreg and digoxin.    Previously determined not an anticoagulation candidate.  Continue aspirin 81 mg daily -Continue amiodarone and digoxin  Moderate care severe mitral valve insufficiency-evaluated previous hospitalization and deemed to be not a surgical candidate.  History of seizure disorder:  -Seen by neurology  recommending continuing AEDs. -Keppra 1000 mg in the morning, 1500 mg in the evening -Depakote 500 mg 3 times daily  Normocytic anemia: Stable. Thrombocytopenia -Chronic.  Hemoglobin around baseline.  Closely monitor.  Intellectual disability -Supportive care.  Vitamin B12 deficiency: Vitamin B12 high  Severe malnutrition: As evidenced by some muscle mass and subcu fat loss.  He had about 22 pound weight loss in the last year but stable since last hospitalization earlier this month. Body mass index is 17.22 kg/m.  Consulted dietitian    DVT prophylaxis: Lovenox Code Status: Full  Family Communication:  Christen Bame and [name] updated.   Status is: Inpatient  Remains inpatient appropriate because:Inpatient level of care appropriate due to severity of illness   Dispo: The patient is from: Home              Anticipated d/c is to: SNF              Anticipated d/c date is: 2 days              Patient currently is not medically stable to d/c. Pending SNF placement.   Subjective: Sitting in the bed, mostly nonverbal.  Later caregiver was at the bedside and he perked up a lot. He seems better but very weak.   Review of Systems Otherwise negative except as per HPI, including: General: Denies fever, chills, night sweats or unintended weight loss. Resp: Denies cough, wheezing, shortness of breath. Cardiac: Denies chest pain, palpitations, orthopnea, paroxysmal nocturnal dyspnea. GI: Denies abdominal pain, nausea, vomiting, diarrhea or constipation GU: Denies dysuria, frequency, hesitancy or incontinence MS: Denies muscle aches, joint pain or swelling Neuro: Denies headache, neurologic deficits (focal weakness, numbness, tingling), abnormal gait  Psych: Denies anxiety, depression, SI/HI/AVH Skin: Denies new rashes or lesions ID: Denies sick contacts, exotic exposures, travel  Examination:  Constitutional: Not in acute distress, non verbal.  Respiratory: Clear to auscultation  bilaterally Cardiovascular: IRRR,  no rubs Abdomen: Nontender nondistended good bowel sounds Musculoskeletal: No edema noted Skin: No rashes seen Neurologic: Difficult to assess.  Psychiatric: Poor judgment and insight.     Objective: Vitals:   03/27/20 2004 03/28/20 0344 03/28/20 0500 03/28/20 0750  BP: 98/63 102/67  92/66  Pulse: 76 (!) 104  78  Resp:    19  Temp: 97.8 F (36.6 C) 98.2 F (36.8 C)  (!) 97.4 F (36.3 C)  TempSrc: Oral Oral  Oral  SpO2: 97% 93%  95%  Weight:   56.9 kg   Height:        Intake/Output Summary (Last 24 hours) at 03/28/2020 0905 Last data filed at 03/28/2020 0500 Gross per 24 hour  Intake 720 ml  Output 1200 ml  Net -480 ml   Filed Weights   03/26/20 0438 03/27/20 0504 03/28/20 0500  Weight: 56.7 kg 57.6 kg 56.9 kg     Data Reviewed:   CBC: Recent Labs  Lab 03/22/20 1140 03/23/20 1334 03/24/20 0757 03/25/20 0552 03/26/20 0542  WBC 4.6 5.2 6.2 4.8 4.8  HGB 10.6* 11.9* 11.9* 11.3* 11.0*  HCT 32.8* 36.1* 34.5* 32.8* 33.2*  MCV 97.9 96.0 92.7 92.7 95.1  PLT 133* 134* 132* 121* 120*   Basic Metabolic Panel: Recent Labs  Lab 03/22/20 1140 03/23/20 1334 03/24/20 0757 03/25/20 0552 03/26/20 0542  NA 134* 137 137 139 137  K 3.8 3.9 3.7 4.0 4.3  CL 106 106 103 104 108  CO2 17* 22 22 24 23   GLUCOSE 85 116* 81 92 93  BUN 13 15 14 13 10   CREATININE 1.22 1.24 0.99 1.10 0.99  CALCIUM 8.1* 8.7* 8.8* 8.5* 8.1*  MG  --   --   --  1.9 1.8  PHOS  --   --   --  3.3 3.6   GFR: Estimated Creatinine Clearance: 66.3 mL/min (by C-G formula based on SCr of 0.99 mg/dL). Liver Function Tests: Recent Labs  Lab 03/25/20 0552 03/26/20 0542  ALBUMIN 3.1* 3.0*   No results for input(s): LIPASE, AMYLASE in the last 168 hours. Recent Labs  Lab 03/23/20 2200 03/25/20 1109 03/26/20 0542  AMMONIA 34 69* 60*   Coagulation Profile: No results for input(s): INR, PROTIME in the last 168 hours. Cardiac Enzymes: No results for input(s):  CKTOTAL, CKMB, CKMBINDEX, TROPONINI in the last 168 hours. BNP (last 3 results) No results for input(s): PROBNP in the last 8760 hours. HbA1C: No results for input(s): HGBA1C in the last 72 hours. CBG: No results for input(s): GLUCAP in the last 168 hours. Lipid Profile: No results for input(s): CHOL, HDL, LDLCALC, TRIG, CHOLHDL, LDLDIRECT in the last 72 hours. Thyroid Function Tests: Recent Labs    03/25/20 1109  FREET4 1.62*   Anemia Panel: Recent Labs    03/25/20 1109  VITAMINB12 2,204*   Sepsis Labs: Recent Labs  Lab 03/23/20 2200 03/24/20 0757  LATICACIDVEN 1.2 0.7    Recent Results (from the past 240 hour(s))  SARS Coronavirus 2 by RT PCR (hospital order, performed in [facility] hospital lab) Nasopharyngeal Nasopharyngeal Swab     Status: None   Collection Time: 03/22/20  1:56 PM   Specimen: Nasopharyngeal Swab  Result Value Ref Range Status   SARS Coronavirus 2 NEGATIVE NEGATIVE Final  Comment: (NOTE) SARS-CoV-2 target nucleic acids are NOT DETECTED.  The SARS-CoV-2 RNA is generally detectable in upper and lower respiratory specimens during the acute phase of infection. The lowest concentration of SARS-CoV-2 viral copies this assay can detect is 250 copies / mL. A negative result does not preclude SARS-CoV-2 infection and should not be used as the sole basis for treatment or other patient management decisions.  A negative result may occur with improper specimen collection / handling, submission of specimen other than nasopharyngeal swab, presence of viral mutation(s) within the areas targeted by this assay, and inadequate number of viral copies (<250 copies / mL). A negative result must be combined with clinical observations, patient history, and epidemiological information.  Fact Sheet for Patients:   BoilerBrush.com.cy  Fact Sheet for Healthcare Providers: https://pope.com/  This test is not yet  approved or  cleared by the [place] FDA and has been authorized for detection and/or diagnosis of SARS-CoV-2 by FDA under an Emergency Use Authorization (EUA).  This EUA will remain in effect (meaning this test can be used) for the duration of the COVID-19 declaration under Section 564(b)(1) of the Act, 21 U.S.C. section 360bbb-3(b)(1), unless the authorization is terminated or revoked sooner.  Performed at [facility], [address], [place], [place] [address]          Radiology Studies: MR BRAIN W WO CONTRAST  Result Date: 03/26/2020 CLINICAL DATA:  Acute neuro deficit.  History of seizure. EXAM: MRI HEAD WITHOUT AND WITH CONTRAST TECHNIQUE: Multiplanar, multiecho pulse sequences of the brain and surrounding structures were obtained without and with intravenous contrast. CONTRAST:  54mL GADAVIST GADOBUTROL 1 MMOL/ML IV SOLN COMPARISON:  CT head 03/23/2020.  MRI head 10/08/2019 FINDINGS: Brain: Moderate atrophy. Moderate ventricular enlargement. No change from 10/08/2019. Negative for acute infarct. Mild white matter changes bilaterally. Negative for hemorrhage or mass Normal enhancement postcontrast administration. Vascular: Normal arterial flow voids Skull and upper cervical spine: No focal skeletal lesion. Sinuses/Orbits: Mild mucosal edema paranasal sinuses. Negative orbit Other: None IMPRESSION: Moderate atrophy and ventricular enlargement, stable No acute abnormality. Mild white matter changes consistent with microvascular ischemia. Electronically Signed   By: Marlan Palau M.D.   On: 03/26/2020 20:26        Scheduled Meds: . amiodarone  200 mg Oral BID  . aspirin EC  81 mg Oral Daily  . cholecalciferol  2,000 Units Oral Daily  . digoxin  0.0625 mg Oral Daily  . divalproex  500 mg Oral TID WC  . enoxaparin (LOVENOX) injection  40 mg Subcutaneous Q24H  . famotidine  40 mg Oral QHS  . feeding supplement (ENSURE ENLIVE)  270 mL Oral Daily  . fenofibrate  160  mg Oral Daily  . fluticasone  2 spray Each Nare Daily  . lactulose  20 g Oral BID   Or  . lactulose  300 mL Rectal BID  . levETIRAcetam  1,000 mg Oral Daily  . levETIRAcetam  1,500 mg Oral QPM  . loratadine  10 mg Oral Daily  . midodrine  10 mg Oral TID WC  . multivitamin with minerals  1 tablet Oral Daily  . pantoprazole  40 mg Oral Daily  . psyllium   Oral Daily  . risperiDONE  0.5 mg Oral Daily  . risperiDONE  1 mg Oral QHS  . sodium chloride flush  10 mL Intravenous Q12H  . vitamin B-12  1,000 mcg Oral Daily   Continuous Infusions:   LOS: 5 days  Time spent= 25 mins    Jeremias Broyhill Joline Maxcy, MD [facility]  If 7PM-7AM, please contact night-coverage  03/28/2020, 9:05 AM

## 2020-03-28 NOTE — Care Management Important Message (Signed)
Important Message  Patient Details  Name: Raymond [name] MRN: [id number] Date of Birth: [birth year]   Medicare Important Message Given:  Other (see comment)  Left message with legal guardian, [name], regarding Medicare IM and need to obtain verbal consent for initial.  Awaiting callback at this time.     [name] 03/28/2020, 1:49 PM

## 2020-03-28 NOTE — Progress Notes (Signed)
   03/28/20 2023  Assess: MEWS Score  Temp 97.9 F (36.6 C)  BP (!) 79/60  Pulse Rate 84  Resp 19  SpO2 96 %  O2 Device Room Air  Assess: MEWS Score  MEWS Temp 0  MEWS Systolic 2  MEWS Pulse 0  MEWS RR 0  MEWS LOC 0  MEWS Score 2  MEWS Score Color Yellow  Assess: if the MEWS score is Yellow or Red  Were vital signs taken at a resting state? Yes  Focused Assessment No change from prior assessment  Early Detection of Sepsis Score *See Row Information* Low  MEWS guidelines implemented *See Row Information* No, vital signs rechecked

## 2020-03-28 NOTE — Progress Notes (Addendum)
Initial Nutrition Assessment  DOCUMENTATION CODES:   Severe malnutrition in context of chronic illness, Underweight  INTERVENTION:  Increase to Ensure Enlive po TID, each supplement provides 350 kcal and 20 grams of protein.  Provide Magic cup TID with meals, each supplement provides 290 kcal and 9 grams of protein.  Continue daily MVI.  Added patient's likes and dislikes to [employer]. Called and spoke with kitchen. Patient will be taken off house diets and meals will be ordered based on updated list provided from caretaker.  NUTRITION DIAGNOSIS:   Severe Malnutrition related to chronic illness (CHF) as evidenced by severe fat depletion, severe muscle depletion.  GOAL:   Patient will meet greater than or equal to 90% of their needs  MONITOR:   PO intake, Supplement acceptance, Labs, Weight trends, I & O's  REASON FOR ASSESSMENT:   Consult Assessment of nutrition requirement/status  ASSESSMENT:   59 year old male with PMHx of intellectual disability, OCD (water drinking), epilepsy, systolic CHF, A-fib admitted with generalized weakness/somnolence, persistent A-fib, mitral valve insufficiency.   Met with patient at bedside. He was unable to provide any history. The only question patient was able to answer was if he liked Ensure (RD noted bottle at bedside) and he replied yes. Per chart patient ate bites/sips to 50% of his meals the past two days. Discussed with RN. Patient did not want breakfast this morning. His caregiver Quita Skye brought in lunch today for patient and he ate 100% of his lunch with Quita Skye (unsure what pt had to eat). Per RN Quita Skye reported that patient is very shy until he gets to know someone so that is why he does not provide much history. RD attempted to call Dale's number listed in chart to obtain more weight/history but call went to voicemail. Left voicemail requesting return call.   Per review of chart patient was 68 kg on 03/23/2019. He is now 56.9 kg (125.5  lbs). He has lost 11.1 kg (16.3% body weight) over the past year, which is not significant for time frame.  Addendum: Received return call from Tawanna Solo who is the [employer] of [facility]. She reports patient typically has a good appetite and intake but is very particular about which foods he will eat. For breakfast he enjoys Raisin Bran cereal in milk, orange juice, chicken sausage patty, muffin, or yogurt. For lunch and dinner he likes spaghetti, [place], pot roast beef, mashed potatoes, and green beans. He also likes cookies, ice cream, and pudding. He dislikes macaroni and cheese, rice, and eggs. He eats 100% of his meals at the group home. She reports he has had slow weight loss over time that she only noticed as his clothing started fitting too loosely.  Medications reviewed and include: vitamin D3 2000 units daily, famotidine, fenofibrate, lactulose, MVI daily, Protonix, psyllium, vitamin B12 1000 micrograms daily.  Labs reviewed.  NUTRITION - FOCUSED PHYSICAL EXAM:    Most Recent Value  Orbital Region Severe depletion  Upper Arm Region Severe depletion  Thoracic and Lumbar Region Severe depletion  Buccal Region Severe depletion  Temple Region Severe depletion  Clavicle Bone Region Severe depletion  Clavicle and Acromion Bone Region Moderate depletion  Scapular Bone Region Moderate depletion  Dorsal Hand Severe depletion  Patellar Region Severe depletion  Anterior Thigh Region Severe depletion  Posterior Calf Region Severe depletion  Edema (RD Assessment) None  Hair Reviewed  Eyes Reviewed  Mouth Reviewed  Skin Reviewed  Nails Reviewed     Diet Order:  Diet Order            Diet Heart Room service appropriate? Yes; Fluid consistency: Thin  Diet effective now                EDUCATION NEEDS:   No education needs have been identified at this time  Skin:  Skin Assessment: Reviewed RN Assessment  Last BM:  Unknown  Height:   Ht Readings from Last 1  Encounters:  03/23/20 6' (1.829 m)   Weight:   Wt Readings from Last 1 Encounters:  03/28/20 56.9 kg   BMI:  Body mass index is 17.02 kg/m.  Estimated Nutritional Needs:   Kcal:  1700-1900  Protein:  85-95 grams  Fluid:  1.7-1.9 L/day  Jacklynn Barnacle, MS, RD, LDN Pager number available on Amion

## 2020-03-29 LAB — BASIC METABOLIC PANEL
Anion gap: 8 (ref 5–15)
BUN: 19 mg/dL (ref 6–20)
CO2: 24 mmol/L (ref 22–32)
Calcium: 8.8 mg/dL — ABNORMAL LOW (ref 8.9–10.3)
Chloride: 105 mmol/L (ref 98–111)
Creatinine, Ser: 1 mg/dL (ref 0.61–1.24)
GFR calc Af Amer: >60 mL/min (ref >60)
GFR calc non Af Amer: >60 mL/min (ref >60)
Glucose, Bld: 97 mg/dL (ref 70–99)
Potassium: 4.1 mmol/L (ref 3.5–5.1)
Sodium: 137 mmol/L (ref 135–145)

## 2020-03-29 LAB — CBC
HCT: 33 % — ABNORMAL LOW (ref 39.0–52.0)
Hemoglobin: 11.5 g/dL — ABNORMAL LOW (ref 13.0–17.0)
MCH: 31.8 pg (ref 26.0–34.0)
MCHC: 34.8 g/dL (ref 30.0–36.0)
MCV: 91.2 fL (ref 80.0–100.0)
Platelets: 175 10*3/uL (ref 150–400)
RBC: 3.62 MIL/uL — ABNORMAL LOW (ref 4.22–5.81)
RDW: 15.4 % (ref 11.5–15.5)
WBC: 5.5 10*3/uL (ref 4.0–10.5)
nRBC: 0 % (ref 0.0–0.2)

## 2020-03-29 LAB — MAGNESIUM: Magnesium: 2 mg/dL (ref 1.7–2.4)

## 2020-03-29 LAB — SARS CORONAVIRUS 2 BY RT PCR (HOSPITAL ORDER, PERFORMED IN ~~LOC~~ HOSPITAL LAB): SARS Coronavirus 2: NEGATIVE

## 2020-03-29 MED ORDER — SODIUM CHLORIDE 0.9 % IV BOLUS
500.0000 mL | Freq: Once | INTRAVENOUS | Status: AC
  Administered 2020-03-29: 500 mL via INTRAVENOUS

## 2020-03-29 MED ORDER — RISPERIDONE 0.5 MG PO TABS: 0.5000 mg | ORAL_TABLET | Freq: Every day | ORAL | Status: DC

## 2020-03-29 MED ORDER — RISPERIDONE 1 MG PO TABS: 1.0000 mg | ORAL_TABLET | Freq: Every day | ORAL | Status: AC

## 2020-03-29 NOTE — Progress Notes (Addendum)
PROGRESS NOTE    Raymond [name]  ENM:[id number] DOB: 1960-11-13 DOA: 03/23/2020 PCP: [name]   Brief Narrative:  59 year old with history of intellectual disability, seizure disorder, OCD, severe MVR, systolic CHF, A. fib not on anticoagulation, HTN recently admitted here for seizure, CHF, A. fib with RVR presented with generalized weakness.  Most of the work-up including infectious was negative.  CT head, CTA chest, MRI brain were without any acute findings, neurology was consulted and thought his issues were likely chronic in nature.  Decrease in Risperdal dose improved his mentation.  Due to soft blood pressures, home losartan was discontinued.   Assessment & Plan:   Active Problems:   Intellectual disability   Epilepsy (HCC)   Weakness   Atrial fibrillation with RVR (HCC)   Chronic systolic CHF (congestive heart failure) (HCC)   Acute on chronic hypotension: Stable. -Home losartan and Coreg discontinued, currently on midodrine 10 mg 3 times daily.  Random cortisol level 14.5.  Supportive care.  Will order 500 cc normal saline bolus today.  Generalized weakness/somnolence: Improved -No focal neuro deficits appreciated.  Elevated ammonia due to Depakote.  TSH/free T4 suggestive of euthyroid sick syndrome.  MRI brain is unremarkable. -Continue lactulose -Risperidone reduced which is helping his mentation.  Currently on 0.5 mg in the morning and 1 mg at bedtime -PT/OT-SNF, continue to work with PT while awaiting placement -Periodic Accu-Cheks.  Currently on heart healthy diet.  Chronic systolic CHF: Echo on 03/01/2020 with EF of 40 to 45%, global hypokinesis, moderate LAE and moderate to severe MVR.    Appears to be euvolemic, not on diuretics.  Persistent atrial fibrillation: Rate controlled.    Recently discharged on amiodarone, Coreg and digoxin.    Previously determined not an anticoagulation candidate.  Continue aspirin 81 mg daily -Continue amiodarone and  digoxin  Moderate care severe mitral valve insufficiency-evaluated previous hospitalization and deemed to be not a surgical candidate.  History of seizure disorder:  -Seen by neurology recommending continuing AEDs. -Keppra 1000 mg in the morning, 1500 mg in the evening -Depakote 500 mg 3 times daily  Normocytic anemia: Stable. Thrombocytopenia -Chronic.  Hemoglobin around baseline.  Closely monitor.  Intellectual disability -Supportive care.  Vitamin B12 deficiency: Vitamin B12 high  Severe malnutrition: As evidenced by some muscle mass and subcu fat loss.  He had about 22 pound weight loss in the last year but stable since last hospitalization earlier this month. Body mass index is 17.22 kg/m.  Consulted dietitian    DVT prophylaxis: Lovenox Code Status: Full  Family Communication:  Ronnie and [name] updated yesterday, now awaiting bed at SNF  Status is: Inpatient  Remains inpatient appropriate because:Inpatient level of care appropriate due to severity of illness   Dispo: The patient is from: Home              Anticipated d/c is to: SNF              Anticipated d/c date is: 1 day              Patient currently is medically stable to be discharged to SNF  Subjective: Sitting up in the chair able to answer his name and denies any complaints.  Limited conversation given his chronic mentation Per nursing staff he does have poor appetite   Examination: Constitutional: Chronically ill-appearing.  Intellectual disability.  Not in acute distress. Respiratory: Clear to auscultation bilaterally Cardiovascular: Normal sinus rhythm, no rubs Abdomen: Nontender nondistended good bowel sounds Musculoskeletal: No  edema noted Skin: No rashes seen Neurologic: CN 2-12 grossly intact.  And nonfocal Psychiatric: Alert to name, poor judgment and insight    Objective: Vitals:   03/28/20 2023 03/28/20 2025 03/29/20 0341 03/29/20 0746  BP: (!) 79/60 (!) 84/60 90/63 94/62    Pulse: 84 77 81 75  Resp: 19  18 16   Temp: 97.9 F (36.6 C)  98.7 F (37.1 C) 97.9 F (36.6 C)  TempSrc: Oral  Oral   SpO2: 96% 98% 96% 96%  Weight:   57.2 kg   Height:        Intake/Output Summary (Last 24 hours) at 03/29/2020 0900 Last data filed at 03/29/2020 0341 Gross per 24 hour  Intake 360 ml  Output 850 ml  Net -490 ml   Filed Weights   03/27/20 0504 03/28/20 0500 03/29/20 0341  Weight: 57.6 kg 56.9 kg 57.2 kg     Data Reviewed:   CBC: Recent Labs  Lab 03/23/20 1334 03/24/20 0757 03/25/20 0552 03/26/20 0542 03/29/20 0333  WBC 5.2 6.2 4.8 4.8 5.5  HGB 11.9* 11.9* 11.3* 11.0* 11.5*  HCT 36.1* 34.5* 32.8* 33.2* 33.0*  MCV 96.0 92.7 92.7 95.1 91.2  PLT 134* 132* 121* 120* 175   Basic Metabolic Panel: Recent Labs  Lab 03/23/20 1334 03/24/20 0757 03/25/20 0552 03/26/20 0542 03/29/20 0333  NA 137 137 139 137 137  K 3.9 3.7 4.0 4.3 4.1  CL 106 103 104 108 105  CO2 22 22 24 23 24   GLUCOSE 116* 81 92 93 97  BUN 15 14 13 10 19   CREATININE 1.24 0.99 1.10 0.99 1.00  CALCIUM 8.7* 8.8* 8.5* 8.1* 8.8*  MG  --   --  1.9 1.8 2.0  PHOS  --   --  3.3 3.6  --    GFR: Estimated Creatinine Clearance: 65.9 mL/min (by C-G formula based on SCr of 1 mg/dL). Liver Function Tests: Recent Labs  Lab 03/25/20 0552 03/26/20 0542  ALBUMIN 3.1* 3.0*   No results for input(s): LIPASE, AMYLASE in the last 168 hours. Recent Labs  Lab 03/23/20 2200 03/25/20 1109 03/26/20 0542  AMMONIA 34 69* 60*   Coagulation Profile: No results for input(s): INR, PROTIME in the last 168 hours. Cardiac Enzymes: No results for input(s): CKTOTAL, CKMB, CKMBINDEX, TROPONINI in the last 168 hours. BNP (last 3 results) No results for input(s): PROBNP in the last 8760 hours. HbA1C: No results for input(s): HGBA1C in the last 72 hours. CBG: No results for input(s): GLUCAP in the last 168 hours. Lipid Profile: No results for input(s): CHOL, HDL, LDLCALC, TRIG, CHOLHDL, LDLDIRECT in  the last 72 hours. Thyroid Function Tests: No results for input(s): TSH, T4TOTAL, FREET4, T3FREE, THYROIDAB in the last 72 hours. Anemia Panel: No results for input(s): VITAMINB12, FOLATE, FERRITIN, TIBC, IRON, RETICCTPCT in the last 72 hours. Sepsis Labs: Recent Labs  Lab 03/23/20 2200 03/24/20 0757  LATICACIDVEN 1.2 0.7    Recent Results (from the past 240 hour(s))  SARS Coronavirus 2 by RT PCR (hospital order, performed in [facility] hospital lab) Nasopharyngeal Nasopharyngeal Swab     Status: None   Collection Time: 03/22/20  1:56 PM   Specimen: Nasopharyngeal Swab  Result Value Ref Range Status   SARS Coronavirus 2 NEGATIVE NEGATIVE Final    Comment: (NOTE) SARS-CoV-2 target nucleic acids are NOT DETECTED.  The SARS-CoV-2 RNA is generally detectable in upper and lower respiratory specimens during the acute phase of infection. The lowest concentration of SARS-CoV-2 viral copies this  assay can detect is 250 copies / mL. A negative result does not preclude SARS-CoV-2 infection and should not be used as the sole basis for treatment or other patient management decisions.  A negative result may occur with improper specimen collection / handling, submission of specimen other than nasopharyngeal swab, presence of viral mutation(s) within the areas targeted by this assay, and inadequate number of viral copies (<250 copies / mL). A negative result must be combined with clinical observations, patient history, and epidemiological information.  Fact Sheet for Patients:   BoilerBrush.com.cy  Fact Sheet for Healthcare Providers: https://pope.com/  This test is not yet approved or  cleared by the [place] FDA and has been authorized for detection and/or diagnosis of SARS-CoV-2 by FDA under an Emergency Use Authorization (EUA).  This EUA will remain in effect (meaning this test can be used) for the duration of the COVID-19  declaration under Section 564(b)(1) of the Act, 21 U.S.C. section 360bbb-3(b)(1), unless the authorization is terminated or revoked sooner.  Performed at [facility], [address] [place], [place] [address]          Radiology Studies: No results found.      Scheduled Meds: . amiodarone  200 mg Oral BID  . aspirin EC  81 mg Oral Daily  . cholecalciferol  2,000 Units Oral Daily  . digoxin  0.0625 mg Oral Daily  . divalproex  500 mg Oral TID WC  . enoxaparin (LOVENOX) injection  40 mg Subcutaneous Q24H  . famotidine  40 mg Oral QHS  . feeding supplement (ENSURE ENLIVE)  237 mL Oral TID BM  . fenofibrate  160 mg Oral Daily  . fluticasone  2 spray Each Nare Daily  . lactulose  20 g Oral BID   Or  . lactulose  300 mL Rectal BID  . levETIRAcetam  1,000 mg Oral Daily  . levETIRAcetam  1,500 mg Oral QPM  . loratadine  10 mg Oral Daily  . midodrine  10 mg Oral TID WC  . multivitamin with minerals  1 tablet Oral Daily  . pantoprazole  40 mg Oral Daily  . psyllium   Oral Daily  . risperiDONE  0.5 mg Oral Daily  . risperiDONE  1 mg Oral QHS  . sodium chloride flush  10 mL Intravenous Q12H  . vitamin B-12  1,000 mcg Oral Daily   Continuous Infusions: . sodium chloride       LOS: 6 days   Time spent= 25 mins    Jamonica Schoff Joline Maxcy, MD [facility]  If 7PM-7AM, please contact night-coverage  03/29/2020, 9:00 AM

## 2020-03-29 NOTE — Progress Notes (Signed)
Occupational Therapy Treatment Patient Details Name: Raymond [name] MRN: [id number] DOB: 02-Sep-1960 Today's Date: 03/29/2020    History of present illness Raymond [name]  is a 59 y.o. Caucasian male with a known history of seizure disorder, mental retardation, obsessive-compulsive disorder and severe mitral regurgitation, presented to the emergency room with acute onset of generalized weakness.  The patient denied any dyspnea at rest, cough or wheezing.  No chest pain or palpitations.  No nausea or vomiting or abdominal pain.  No dysuria, oliguria or hematuria urgency or frequency or flank pain. Upon presentation to the emergency room, vital signs were within normal.  Pulse oximetry dropped when the patient stood up.  Labs revealed a creatinine of 1.24 post 2 previous levels with a BUN of 15 ammonia level of 34 and BNP 414 with high-sensitivity troponin I of 11 lactic acid 1.2.  CBC showed mild anemia better than previous levels.  Chest x-ray showed no acute cardiopulmonary disease.  EKG showed atrial fibrillation with controlled ventricular response of 88. Pt is now admitted for hypotension which is a chronic problem for him in additional to generalized weakness and somnolence.    OT comments  Pt seen for OT tx this date. Pt received in recliner, slouched down with hips close to foot rest. Max Ax2 to scoot back up with nurse tech. Pt noted with large BM in recliner. Nurse tech assisted with clean up. CGA x2 with RW and VC to initiate for sit to stand transfer. CGA-Min A for static standing balance and verbal/tactile cues for improving trunk positioning to minimize posterior lean while nurse tech provided Max A for pericare in standing. Linens in recliner and gown changed. Pt able to respond to simple questions and commands. Denied pain, however did shake his fist once during session just prior to nurse tech reapplying condom catheter. When asked if pt was ok pt stated he was "okay" and denied pain. Pt  continues to benefit from skilled OT services to address impairments and functional deficits to maximize return to PLOF and eventual return to group home if appropriate. Continue to recommend SNF at this time.    Follow Up Recommendations  SNF    Equipment Recommendations  3 in 1 bedside commode    Recommendations for Other Services      Precautions / Restrictions Precautions Precautions: Fall Precaution Comments: Monitor BP Restrictions Weight Bearing Restrictions: No       Mobility Bed Mobility Overal bed mobility: Needs Assistance Bed Mobility: Supine to Sit     Supine to sit: Mod assist;HOB elevated     General bed mobility comments: deferred, up in recliner at start and end of session  Transfers Overall transfer level: Needs assistance Equipment used: Rolling walker (2 wheeled) Transfers: Sit to/from Stand Sit to Stand: Min guard;+2 safety/equipment         General transfer comment: CGA +2 from recliner to stand for pericare and linens change in recliner    Balance Overall balance assessment: Needs assistance Sitting-balance support: Feet supported;Single extremity supported Sitting balance-Leahy Scale: Poor Sitting balance - Comments: Pt requiring minA+1 to maintaing upright sitting balance. He continually falls backwards in sitting with poor righting reactions Postural control: Posterior lean Standing balance support: Bilateral upper extremity supported Standing balance-Leahy Scale: Poor Standing balance comment: requires CGA-Min A and moderate verbal/tactile cues for improved posture to minimize posterior lean  ADL either performed or assessed with clinical judgement   ADL Overall ADL's : Needs assistance/impaired                             Toileting- Clothing Manipulation and Hygiene: Maximal assistance;Sit to/from stand Toileting - Clothing Manipulation Details (indicate cue type and reason): CGA to  Min A in standing for static standing balance while nurse tech provided max assist for pericare; cues for handplacement and upright positioning as pt tends to extend his upper back increasing his risk of falls posteriorly             Vision       Perception     Praxis      Cognition Arousal/Alertness: Awake/alert Behavior During Therapy: Flat affect Overall Cognitive Status: History of cognitive impairments - at baseline                                 General Comments: able to appropriately respond to simple questions and commands        Exercises Other Exercises Other Exercises: pt assisted to sitting, min-modA to maintain positioning for RN to administered medication. at least 10 minutes spent in sitting, consistent cueing to encourage pt to sip drink and swallow medications. Other Exercises: STS toileting task from recliner, RW for static standing   Shoulder Instructions       General Comments HR up to low 120's with standing, recovers quickly to mid 90's    Pertinent Vitals/ Pain       Pain Assessment: No/denies pain Faces Pain Scale: Hurts a little bit  Home Living                                          Prior Functioning/Environment              Frequency  Min 1X/week        Progress Toward Goals  OT Goals(current goals can now be found in the care plan section)  Progress towards OT goals: OT to reassess next treatment  Acute Rehab OT Goals Patient Stated Goal: none, pt unable to state OT Goal Formulation: Patient unable to participate in goal setting Time For Goal Achievement: 04/07/20 Potential to Achieve Goals: Fair  Plan Discharge plan remains appropriate;Frequency remains appropriate    Co-evaluation                 AM-PAC OT "6 Clicks" Daily Activity     Outcome Measure   Help from another person eating meals?: A Lot Help from another person taking care of personal grooming?: A Lot Help  from another person toileting, which includes using toliet, bedpan, or urinal?: A Lot Help from another person bathing (including washing, rinsing, drying)?: A Lot Help from another person to put on and taking off regular upper body clothing?: A Lot Help from another person to put on and taking off regular lower body clothing?: A Lot 6 Click Score: 12    End of Session Equipment Utilized During Treatment: Rolling walker  OT Visit Diagnosis: Other abnormalities of gait and mobility (R26.89);Muscle weakness (generalized) (M62.81);History of falling (Z91.81)   Activity Tolerance Patient tolerated treatment well   Patient Left in chair;with call bell/phone within reach;with chair alarm set   Nurse Communication  Other (comment) (safety, mobility, BM)        Time: 3785-8850 OT Time Calculation (min): 18 min  Charges: OT General Charges $OT Visit: 1 Visit OT Treatments $Self Care/Home Management : 8-22 mins  Richrd Prime, MPH, MS, OTR/L ascom [phone] 03/29/20, 12:15 PM

## 2020-03-29 NOTE — Progress Notes (Signed)
Physical Therapy Treatment Patient Details Name: Raymond [name] MRN: [id number] DOB: [birth year] Today's Date: 03/29/2020    History of Present Illness Raymond [name]  is a 59 y.o. Caucasian male with a known history of seizure disorder, mental retardation, obsessive-compulsive disorder and severe mitral regurgitation, presented to the emergency room with acute onset of generalized weakness.  The patient denied any dyspnea at rest, cough or wheezing.  No chest pain or palpitations.  No nausea or vomiting or abdominal pain.  No dysuria, oliguria or hematuria urgency or frequency or flank pain. Upon presentation to the emergency room, vital signs were within normal.  Pulse oximetry dropped when the patient stood up.  Labs revealed a creatinine of 1.24 post 2 previous levels with a BUN of 15 ammonia level of 34 and BNP 414 with high-sensitivity troponin I of 11 lactic acid 1.2.  CBC showed mild anemia better than previous levels.  Chest x-ray showed no acute cardiopulmonary disease.  EKG showed atrial fibrillation with controlled ventricular response of 88. Pt is now admitted for hypotension which is a chronic problem for him in additional to generalized weakness and somnolence.     PT Comments    Pt sleeping, woken by sound/touch. Assisted to sitting EOB for medication administration with RN, minA, pt able to initiate and participate in trunk elevation. Extended time spent in sitting, min-modA for pt to maintain balance, posterior lean noted throughout. Pt able to 1-2 times reach for drink or medications, RN predominantly total assist to accomplish task. BP assessed in sitting initially, and after several minutes, BP reading 97/56. Transferred to chair with RW and minAx2 for general unsteadiness, pt able to take several steps. Pt did shake his fist at therapist after sitting and smiled, but further mobility deferred. The patient would benefit from further skilled PT intervention to continue to progress  towards goals. Recommendation remains appropriate.       Follow Up Recommendations  SNF     Equipment Recommendations  None recommended by PT;Other (comment)    Recommendations for Other Services       Precautions / Restrictions Precautions Precautions: Fall Precaution Comments: Monitor BP Restrictions Weight Bearing Restrictions: No    Mobility  Bed Mobility Overal bed mobility: Needs Assistance Bed Mobility: Supine to Sit     Supine to sit: Mod assist;HOB elevated     General bed mobility comments: Pt requiring minA+1 for legs with supine<>sit transfers. Once he is sitting upright at EOB he falls backwards continually requiring minA+1-modA+1 to remain upright in sitting  Transfers Overall transfer level: Needs assistance Equipment used: Rolling walker (2 wheeled) Transfers: Sit to/from Stand Sit to Stand: Min assist;+2 safety/equipment            Ambulation/Gait Ambulation/Gait assistance: +2 safety/equipment;Min assist Gait Distance (Feet): 2 Feet Assistive device: Rolling walker (2 wheeled)   Gait velocity: decreased   General Gait Details: Pt able to take several steps towards recliner, able to follow verbal commands, CGA-minA for safety. Pt shook his fist at PT after transferring to chair with a smile, but further mobility held.   Stairs             Wheelchair Mobility    Modified Rankin (Stroke Patients Only)       Balance Overall balance assessment: Needs assistance Sitting-balance support: Feet supported;Single extremity supported Sitting balance-Leahy Scale: Poor Sitting balance - Comments: Pt requiring minA+1 to maintaing upright sitting balance. He continually falls backwards in sitting with poor righting reactions  Standing balance-Leahy Scale: Poor Standing balance comment: no Posterior lean noted this session but generalized unsteadiness                            Cognition Arousal/Alertness:  Lethargic Behavior During Therapy: Flat affect Overall Cognitive Status: No family/caregiver present to determine baseline cognitive functioning                                 General Comments: minimal verbalization, did shake his fist at therapist a few times and then smiled. does follow simple commands most of the time      Exercises Other Exercises Other Exercises: pt assisted to sitting, min-modA to maintain positioning for RN to administered medication. at least 10 minutes spent in sitting, consistent cueing to encourage pt to sip drink and swallow medications.    General Comments General comments (skin integrity, edema, etc.): BP in sitting 90s/50s prior to attempting transferring to chair      Pertinent Vitals/Pain Pain Assessment: Faces Faces Pain Scale: Hurts a little bit    Home Living                      Prior Function            PT Goals (current goals can now be found in the care plan section) Progress towards PT goals: Progressing toward goals    Frequency    Min 2X/week      PT Plan Current plan remains appropriate    Co-evaluation              AM-PAC PT "6 Clicks" Mobility   Outcome Measure  Help needed turning from your back to your side while in a flat bed without using bedrails?: A Little Help needed moving from lying on your back to sitting on the side of a flat bed without using bedrails?: A Little Help needed moving to and from a bed to a chair (including a wheelchair)?: A Little Help needed standing up from a chair using your arms (e.g., wheelchair or bedside chair)?: A Little Help needed to walk in hospital room?: A Lot Help needed climbing 3-5 steps with a railing? : Total 6 Click Score: 15    End of Session Equipment Utilized During Treatment: Gait belt Activity Tolerance: Patient limited by fatigue Patient left: in chair;with call bell/phone within reach;with chair alarm set;with nursing/sitter in  room Nurse Communication: Mobility status PT Visit Diagnosis: Muscle weakness (generalized) (M62.81);Unsteadiness on feet (R26.81);Difficulty in walking, not elsewhere classified (R26.2)     Time: [id number] PT Time Calculation (min) (ACUTE ONLY): 23 min  Charges:  $Therapeutic Activity: 23-37 mins                    Olga Coaster PT, DPT 10:27 AM,03/29/20

## 2020-03-29 NOTE — Care Management Important Message (Signed)
Important Message  Patient Details  Name: Raymond [name] MRN: [id number] Date of Birth: [birth year]   Medicare Important Message Given:  Yes  Initial Medicare IM given by Patient Access Associate on 03/29/2020 at 9:49am.   [name] 03/29/2020, 10:32 AM

## 2020-03-29 NOTE — Discharge Summary (Signed)
Physician Discharge Summary  Raymond [name]:[id number] DOB: 04-Nov-1960 DOA: 03/23/2020  PCP: [name]  Admit date: 03/23/2020 Discharge date: 03/29/2020  Admitted From: Group home  Disposition:  SNG  Recommendations for Outpatient Follow-up:  1. Follow up with PCP in 1-2 weeks 2. Please obtain BMP/CBC in one week your next doctors visit.  3. Risperdal decreased-0.5 mg in the morning and 1 mg at bedtime. 4. Losartan and Coreg discontinued for now 5. Continue amiodarone 200 mg daily e, daily digoxin and midodrine 10 mg p.o. 3 times daily.  Adjust blood pressure medications as needed.   Discharge Condition: Stable CODE STATUS: Full Diet recommendation: Heart healthy  Brief/Interim Summary: 59 year old with history of intellectual disability, seizure disorder, OCD, severe MVR, systolic CHF, A. fib not on anticoagulation, HTN recently admitted here for seizure, CHF, A. fib with RVR presented with generalized weakness.  Most of the work-up including infectious was negative.  CT head, CTA chest, MRI brain were without any acute findings, neurology was consulted and thought his issues were likely chronic in nature.  Decrease in Risperdal dose improved his mentation.  Due to soft blood pressures, home losartan was discontinued.  PT recommended SNF.  Mentation returned to baseline.   Assessment & Plan:   Active Problems:   Intellectual disability   Epilepsy (HCC)   Weakness   Atrial fibrillation with RVR (HCC)   Chronic systolic CHF (congestive heart failure) (HCC)   Acute on chronic hypotension: Stable. -Home losartan and Coreg discontinued, currently on midodrine 10 mg 3 times daily.  Random cortisol level 14.5.  Supportive care.    Generalized weakness/somnolence: Improved-currently at baseline. -No focal neuro deficits appreciated.  Elevated ammonia due to Depakote.  TSH/free T4 suggestive of euthyroid sick syndrome.  MRI brain is unremarkable. -Continue  lactulose -Risperidone reduced which is helping his mentation.  Currently on 0.5 mg in the morning and 1 mg at bedtime -PT/OT-SNF  Chronic systolic CHF: Echo on 03/01/2020 with EF of 40 to 45%, global hypokinesis, moderate LAE and moderate to severe MVR.   Appears to be euvolemic, not on diuretics.  Persistent atrial fibrillation: Rate controlled.  Recently discharged on amiodarone, Coreg and digoxin.   Previously determined not an anticoagulation candidate.  Continue aspirin 81 mg daily -Continue amiodarone and digoxin  Moderate care severe mitral valve insufficiency-evaluated previous hospitalization and deemed to be not a surgical candidate.  History of seizure disorder:  -Seen by neurology recommending continuing AEDs. -Keppra 1000 mg in the morning, 1500 mg in the evening -Depakote 500 mg 3 times daily  Normocytic anemia: Stable. Thrombocytopenia -Chronic.  Hemoglobin around baseline.  Closely monitor.  Intellectual disability -Supportive care.  Vitamin B12 deficiency: Vitamin B12 high  Severe malnutrition: As evidenced by some muscle mass and subcu fat loss. He had about 22 pound weight loss in the last year but stable since last hospitalization earlier this month. Body mass index is 17.22 kg/m. Consulted dietitian   Body mass index is 17.09 kg/m.         Discharge Diagnoses:  Active Problems:   Intellectual disability   Epilepsy (HCC)   Weakness   Atrial fibrillation with RVR (HCC)   Chronic systolic CHF (congestive heart failure) (HCC)      Subjective: Sitting in the chair no complaints today.  Mentation at baseline  Discharge Exam: Vitals:   03/29/20 0746 03/29/20 1139  BP: 94/62 100/63  Pulse: 75 62  Resp: 16 18  Temp: 97.9 F (36.6 C) 98.4 F (36.9 C)  SpO2: 96% 100%   Vitals:   03/28/20 2025 03/29/20 0341 03/29/20 0746 03/29/20 1139  BP: (!) 84/60 90/63 94/62  100/63  Pulse: 77 81 75 62  Resp:  18 16 18   Temp:  98.7 F  (37.1 C) 97.9 F (36.6 C) 98.4 F (36.9 C)  TempSrc:  Oral    SpO2: 98% 96% 96% 100%  Weight:  57.2 kg    Height:        General: Chronically ill-appearing, limited conversation.  Not in acute distress. Cardiovascular: RRR, S1/S2 +, no rubs, no gallops Respiratory: CTA bilaterally, no wheezing, no rhonchi Abdominal: Soft, NT, ND, bowel sounds + Extremities: no edema, no cyanosis  Discharge Instructions   Allergies as of 03/29/2020   No Known Allergies     Medication List    STOP taking these medications   losartan 25 MG tablet Commonly known as: COZAAR     TAKE these medications   albuterol 108 (90 Base) MCG/ACT inhaler Commonly known as: VENTOLIN HFA Inhale 2 puffs into the lungs every 6 (six) hours as needed for wheezing or shortness of breath.   amiodarone 200 MG tablet Commonly known as: PACERONE Take 1 tablet (200 mg total) by mouth daily. Start taking this dosage on March 16, 2020 What changed: Another medication with the same name was removed. Continue taking this medication, and follow the directions you see here.   aspirin EC 81 MG tablet Take 81 mg by mouth daily.   BENEFIBER PO Take by mouth daily. 2 ? in 8 oz QD   Digoxin 62.5 MCG Tabs Take 0.0625 mg by mouth daily.   divalproex 500 MG DR tablet Commonly known as: DEPAKOTE TAKE 1 TABLET BY MOUTH THREE TIMES DAILY WITH MEALS. What changed: when to take this   ENSURE PO Take 1 Can by mouth daily.   famotidine 40 MG tablet Commonly known as: PEPCID Take 40 mg by mouth at bedtime.   fenofibrate 145 MG tablet Commonly known as: TRICOR TAKE 1 TABLET BY MOUTH ONCE DAILY FOR LIPIDS. What changed: See the new instructions.   fluticasone 50 MCG/ACT nasal spray Commonly known as: FLONASE Place 2 sprays into both nostrils daily. Use PRN   guaiFENesin 600 MG 12 hr tablet Commonly known as: MUCINEX Take 1 tablet (600 mg total) by mouth 2 (two) times daily as needed.   levETIRAcetam 750 MG  tablet Commonly known as: KEPPRA Take 2 tablets (1,500 mg total) by mouth every evening.   levETIRAcetam 1000 MG tablet Commonly known as: KEPPRA Take 1 tablet (1,000 mg total) by mouth daily.   loratadine 10 MG tablet Commonly known as: CLARITIN TAKE 1 TABLET BY MOUTH ONCE DAILY. What changed:   when to take this  reasons to take this   meclizine 25 MG tablet Commonly known as: ANTIVERT Take 25 mg by mouth 3 (three) times daily as needed for dizziness.   midodrine 10 MG tablet Commonly known as: PROAMATINE Take 1 tablet (10 mg total) by mouth 3 (three) times daily with meals.   multivitamin with minerals Tabs tablet Take 1 tablet by mouth daily.   omeprazole 40 MG capsule Commonly known as: PRILOSEC Take 40 mg by mouth daily.   risperiDONE 1 MG tablet Commonly known as: RISPERDAL Take 1 tablet (1 mg total) by mouth at bedtime. What changed:   medication strength  how much to take   risperiDONE 0.5 MG tablet Commonly known as: RISPERDAL Take 1 tablet (0.5 mg total) by mouth daily. Start  taking on: March 30, 2020 What changed:   medication strength  how much to take   VITAMIN B 12 PO Take 1,000 mcg by mouth daily.   Vitamin D 50 MCG (2000 UT) Caps Take 2,000 Units by mouth daily.   VITAMIN D2 PO Take 1.25 mg by mouth once a week.       Contact information for follow-up providers    [facility]. Schedule an appointment as soon as possible for a visit in 1 week(s).   Contact information: [address] [place] [address] [phone]            Contact information for after-discharge care    Destination    [facility] Preferred [facility] .   Service: Skilled Nursing Contact information: [address] [place] [address] [phone]                 No Known Allergies  You were cared for by a hospitalist during your hospital stay. If you have any questions about your discharge  medications or the care you received while you were in the hospital after you are discharged, you can call the unit and asked to speak with the hospitalist on call if the hospitalist that took care of you is not available. Once you are discharged, your primary care physician will handle any further medical issues. Please note that no refills for any discharge medications will be authorized once you are discharged, as it is imperative that you return to your primary care physician (or establish a relationship with a primary care physician if you do not have one) for your aftercare needs so that they can reassess your need for medications and monitor your lab values.   Procedures/Studies: DG Chest 2 View  Result Date: 03/23/2020 CLINICAL DATA:  Weakness EXAM: CHEST - 2 VIEW COMPARISON:  03/03/2020 FINDINGS: Cardiac shadow is enlarged but stable. Aortic calcifications are again seen. Previously seen vascular congestion has resolved in the interval. No focal infiltrate or effusion is seen. No bony abnormality is noted. IMPRESSION: No acute abnormality seen. Electronically Signed   By: Alcide Clever M.D.   On: 03/23/2020 19:12   DG Chest 2 View  Result Date: 03/01/2020 CLINICAL DATA:  Cough.  Tachycardia. EXAM: CHEST - 2 VIEW COMPARISON:  CT abdomen 10/07/2019.  Chest x-ray 07/22/2016. FINDINGS: Cardiomegaly with pulmonary venous congestion and bilateral interstitial prominence most consistent interstitial edema. Pneumonitis cannot be excluded. No pleural effusion or pneumothorax. Pulmonary nodule identified on prior CT of 10/07/2019 best identified by prior CT. No acute bony abnormality. IMPRESSION: Cardiomegaly with pulmonary venous congestion and bilateral interstitial prominence most consistent with congestive heart failure with interstitial edema. No pleural effusion. Electronically Signed   By: Maisie Fus  Register   On: 03/01/2020 06:04   CT Head Wo Contrast  Result Date: 03/23/2020 CLINICAL DATA:  Delay  area.  Weakness. EXAM: CT HEAD WITHOUT CONTRAST TECHNIQUE: Contiguous axial images were obtained from the base of the skull through the vertex without intravenous contrast. COMPARISON:  Brain MRI 10/08/2019 FINDINGS: Brain: No hemorrhage or evidence of acute ischemia. Mild generalized atrophy. There is ventricular dilatation which is stable from prior MRI, but greater than expected for age, likely due to central atrophy. No midline shift or mass effect. No extra-axial or subdural collection. Occasional basal gangliar mineralization typically senescent. Vascular: No hyperdense vessel. Skull: No fracture or focal lesion. Sinuses/Orbits: Paranasal sinuses and mastoid air cells are clear. The visualized orbits are unremarkable. Mild leftward  nasal septal deviation. Other: None. IMPRESSION: 1. No acute intracranial abnormality. 2. Stable but age advanced atrophy from MRI in March. Electronically Signed   By: Narda Rutherford M.D.   On: 03/23/2020 22:42   CT Angio Chest PE W and/or Wo Contrast  Result Date: 03/24/2020 CLINICAL DATA:  Hypoxia EXAM: CT ANGIOGRAPHY CHEST WITH CONTRAST TECHNIQUE: Multidetector CT imaging of the chest was performed using the standard protocol during bolus administration of intravenous contrast. Multiplanar CT image reconstructions and MIPs were obtained to evaluate the vascular anatomy. CONTRAST:  36mL OMNIPAQUE IOHEXOL 350 MG/ML SOLN COMPARISON:  None. FINDINGS: Cardiovascular: There is excellent opacification of the central pulmonary arteries. No intraluminal filling defect to suggest acute pulmonary embolism. The central pulmonary arteries are of normal caliber. There is moderate cardiomegaly with left ventricular and left atrial dilation. Mild coronary artery calcification. No pericardial effusion. The thoracic aorta is of normal caliber. Minimal atherosclerotic calcification within the thoracic aorta. Mediastinum/Nodes: No enlarged mediastinal, hilar, or axillary lymph nodes. Thyroid  gland, trachea, and esophagus demonstrate no significant findings. Lungs/Pleura: Imaging is slightly limited by motion artifact. The lungs are clear. No pneumothorax or pleural effusion. Central airways are widely patent. Upper Abdomen: No acute abnormality. Musculoskeletal: No chest wall abnormality. No acute or significant osseous findings. Review of the MIP images confirms the above findings. IMPRESSION: 1. No evidence of acute pulmonary embolism. 2. Moderate cardiomegaly with left ventricular and left atrial dilation. 3. Lungs are clear. Aortic Atherosclerosis (ICD10-I70.0). Electronically Signed   By: Helyn Numbers MD   On: 03/24/2020 00:26   MR BRAIN W WO CONTRAST  Result Date: 03/26/2020 CLINICAL DATA:  Acute neuro deficit.  History of seizure. EXAM: MRI HEAD WITHOUT AND WITH CONTRAST TECHNIQUE: Multiplanar, multiecho pulse sequences of the brain and surrounding structures were obtained without and with intravenous contrast. CONTRAST:  80mL GADAVIST GADOBUTROL 1 MMOL/ML IV SOLN COMPARISON:  CT head 03/23/2020.  MRI head 10/08/2019 FINDINGS: Brain: Moderate atrophy. Moderate ventricular enlargement. No change from 10/08/2019. Negative for acute infarct. Mild white matter changes bilaterally. Negative for hemorrhage or mass Normal enhancement postcontrast administration. Vascular: Normal arterial flow voids Skull and upper cervical spine: No focal skeletal lesion. Sinuses/Orbits: Mild mucosal edema paranasal sinuses. Negative orbit Other: None IMPRESSION: Moderate atrophy and ventricular enlargement, stable No acute abnormality. Mild white matter changes consistent with microvascular ischemia. Electronically Signed   By: Marlan Palau M.D.   On: 03/26/2020 20:26   DG Chest Port 1 View  Result Date: 03/03/2020 CLINICAL DATA:  History of congestive heart failure and mitral regurgitation EXAM: PORTABLE CHEST 1 VIEW COMPARISON:  March 01, 2020 FINDINGS: There is cardiomegaly with pulmonary venous  hypertension. There is a slight degree of interstitial edema. There is no airspace opacity. No adenopathy. No bone lesions. IMPRESSION: Cardiomegaly with pulmonary vascular congestion. Mild interstitial edema. Suspect a degree of underlying congestive heart failure. No consolidation. Electronically Signed   By: Bretta Bang III M.D.   On: 03/03/2020 12:19   ECHOCARDIOGRAM COMPLETE  Result Date: 03/01/2020    ECHOCARDIOGRAM REPORT   Patient Name:   HITESH FOUCHE Date of Exam: 03/01/2020 Medical Rec #:  [id number]        Height:       68.0 in Accession #:    [id number]       Weight:       140.4 lb Date of Birth:  [birth year]       BSA:          1.759 m  Patient Age:    58 years         BP:           103/79 mmHg Patient Gender: M                HR:           114 bpm. Exam Location:  [facility] Procedure: 2D Echo, Color Doppler and Cardiac Doppler Indications:     CHF- acute diastolic 428.31  History:         Patient has prior history of Echocardiogram examinations, most                  recent 10/29/2017. Signs/Symptoms:Murmur. Severe Mitral                  regurgitation.  Sonographer:     Cristela Blue RDCS (AE) Referring Phys:  [id number] Brien Few NIU Diagnosing Phys: Harold Hedge MD IMPRESSIONS  1. Left ventricular ejection fraction, by estimation, is 40 to 45%. Left ventricular ejection fraction by 2D MOD biplane is 34.5 %. The left ventricle has mildly decreased function. The left ventricle demonstrates global hypokinesis. Left ventricular diastolic parameters were normal.  2. Right ventricular systolic function is normal. The right ventricular size is normal. There is normal pulmonary artery systolic pressure.  3. Left atrial size was moderately dilated.  4. Right atrial size was mildly dilated.  5. The mitral valve is abnormal. Moderate to severe mitral valve regurgitation.  6. The aortic valve was not well visualized. Aortic valve regurgitation is trivial. FINDINGS  Left Ventricle: Left ventricular ejection fraction, by  estimation, is 40 to 45%. Left ventricular ejection fraction by 2D MOD biplane is 34.5 %. The left ventricle has mildly decreased function. The left ventricle demonstrates global hypokinesis. The left ventricular internal cavity size was normal in size. There is borderline left ventricular hypertrophy. Left ventricular diastolic parameters were normal. Right Ventricle: The right ventricular size is normal. No increase in right ventricular wall thickness. Right ventricular systolic function is normal. There is normal pulmonary artery systolic pressure. The tricuspid regurgitant velocity is 2.43 m/s, and  with an assumed right atrial pressure of 10 mmHg, the estimated right ventricular systolic pressure is 33.6 mmHg. Left Atrium: Left atrial size was moderately dilated. Right Atrium: Right atrial size was mildly dilated. Pericardium: There is no evidence of pericardial effusion. Mitral Valve: The mitral valve is abnormal. There is moderate prolapse of the medial scallop of the posterior leaflet of the mitral valve. Moderate to severe mitral valve regurgitation. MV peak gradient, 8.8 mmHg. The mean mitral valve gradient is 4.0 mmHg. Tricuspid Valve: The tricuspid valve is grossly normal. Tricuspid valve regurgitation is trivial. Aortic Valve: The aortic valve was not well visualized. Aortic valve regurgitation is trivial. Aortic valve mean gradient measures 15.7 mmHg. Aortic valve peak gradient measures 24.5 mmHg. Aortic valve area, by VTI measures 0.73 cm. Pulmonic Valve: The pulmonic valve was not well visualized. Pulmonic valve regurgitation is trivial. Aorta: The aortic root is normal in size and structure. IAS/Shunts: The interatrial septum was not assessed.  LEFT VENTRICLE PLAX 2D                        Biplane EF (MOD) LVIDd:         6.07 cm         LV Biplane EF:   Left LVIDs:         5.56 cm  ventricular LV PW:         1.66 cm                          ejection LV IVS:        0.96 cm                           fraction by LVOT diam:     2.30 cm                          2D MOD LV SV:         35                               biplane is LV SV Index:   20                               34.5 %. LVOT Area:     4.15 cm                                Diastology                                LV e' lateral:   12.20 cm/s LV Volumes (MOD)               LV E/e' lateral: 8.4 LV vol d, MOD    133.0 ml      LV e' medial:    11.10 cm/s A2C:                           LV E/e' medial:  9.2 LV vol d, MOD    169.0 ml A4C: LV vol s, MOD    93.1 ml A2C: LV vol s, MOD    99.5 ml A4C: LV SV MOD A2C:   39.9 ml LV SV MOD A4C:   169.0 ml LV SV MOD BP:    53.6 ml RIGHT VENTRICLE RV S prime:     11.60 cm/s TAPSE (M-mode): 2.4 cm LEFT ATRIUM            Index       RIGHT ATRIUM           Index LA diam:      5.60 cm  3.18 cm/m  RA Area:     12.20 cm LA Vol (A2C): 98.9 ml  56.24 ml/m RA Volume:   26.50 ml  15.07 ml/m LA Vol (A4C): 173.0 ml 98.37 ml/m  AORTIC VALVE                    PULMONIC VALVE AV Area (Vmax):    0.73 cm     RVOT Peak grad: 3 mmHg AV Area (Vmean):   0.58 cm AV Area (VTI):     0.73 cm AV Vmax:           247.67 cm/s AV Vmean:          179.000 cm/s AV VTI:            0.485 m AV Peak Grad:  24.5 mmHg AV Mean Grad:      15.7 mmHg LVOT Vmax:         43.60 cm/s LVOT Vmean:        25.000 cm/s LVOT VTI:          0.085 m LVOT/AV VTI ratio: 0.17  AORTA Ao Root diam: 2.90 cm MITRAL VALVE                TRICUSPID VALVE MV Area (PHT): 4.36 cm     TR Peak grad:   23.6 mmHg MV Peak grad:  8.8 mmHg     TR Vmax:        243.00 cm/s MV Mean grad:  4.0 mmHg MV Vmax:       1.48 m/s     SHUNTS MV Vmean:      82.1 cm/s    Systemic VTI:  0.08 m MV Decel Time: 174 msec     Systemic Diam: 2.30 cm MV E velocity: 102.00 cm/s MV A velocity: 37.80 cm/s MV E/A ratio:  2.70 Harold Hedge MD Electronically signed by Harold Hedge MD Signature Date/Time: 03/01/2020/12:35:00 PM    Final       The results of significant diagnostics from  this hospitalization (including imaging, microbiology, ancillary and laboratory) are listed below for reference.     Microbiology: Recent Results (from the past 240 hour(s))  SARS Coronavirus 2 by RT PCR (hospital order, performed in [facility] hospital lab) Nasopharyngeal Nasopharyngeal Swab     Status: None   Collection Time: 03/22/20  1:56 PM   Specimen: Nasopharyngeal Swab  Result Value Ref Range Status   SARS Coronavirus 2 NEGATIVE NEGATIVE Final    Comment: (NOTE) SARS-CoV-2 target nucleic acids are NOT DETECTED.  The SARS-CoV-2 RNA is generally detectable in upper and lower respiratory specimens during the acute phase of infection. The lowest concentration of SARS-CoV-2 viral copies this assay can detect is 250 copies / mL. A negative result does not preclude SARS-CoV-2 infection and should not be used as the sole basis for treatment or other patient management decisions.  A negative result may occur with improper specimen collection / handling, submission of specimen other than nasopharyngeal swab, presence of viral mutation(s) within the areas targeted by this assay, and inadequate number of viral copies (<250 copies / mL). A negative result must be combined with clinical observations, patient history, and epidemiological information.  Fact Sheet for Patients:   BoilerBrush.com.cy  Fact Sheet for Healthcare Providers: https://pope.com/  This test is not yet approved or  cleared by the [place] FDA and has been authorized for detection and/or diagnosis of SARS-CoV-2 by FDA under an Emergency Use Authorization (EUA).  This EUA will remain in effect (meaning this test can be used) for the duration of the COVID-19 declaration under Section 564(b)(1) of the Act, 21 U.S.C. section 360bbb-3(b)(1), unless the authorization is terminated or revoked sooner.  Performed at [facility], [address]  [place], [place] [address]   SARS Coronavirus 2 by RT PCR (hospital order, performed in [facility] hospital lab) Nasopharyngeal Nasopharyngeal Swab     Status: None   Collection Time: 03/29/20 12:59 PM   Specimen: Nasopharyngeal Swab  Result Value Ref Range Status   SARS Coronavirus 2 NEGATIVE NEGATIVE Final    Comment: (NOTE) SARS-CoV-2 target nucleic acids are NOT DETECTED.  The SARS-CoV-2 RNA is generally detectable in upper and lower respiratory specimens during the acute phase of infection. The lowest concentration of SARS-CoV-2 viral  copies this assay can detect is 250 copies / mL. A negative result does not preclude SARS-CoV-2 infection and should not be used as the sole basis for treatment or other patient management decisions.  A negative result may occur with improper specimen collection / handling, submission of specimen other than nasopharyngeal swab, presence of viral mutation(s) within the areas targeted by this assay, and inadequate number of viral copies (<250 copies / mL). A negative result must be combined with clinical observations, patient history, and epidemiological information.  Fact Sheet for Patients:   BoilerBrush.com.cyhttps://www.fda.gov/media/136312/download  Fact Sheet for Healthcare Providers: https://pope.com/https://www.fda.gov/media/136313/download  This test is not yet approved or  cleared by the [place] FDA and has been authorized for detection and/or diagnosis of SARS-CoV-2 by FDA under an Emergency Use Authorization (EUA).  This EUA will remain in effect (meaning this test can be used) for the duration of the COVID-19 declaration under Section 564(b)(1) of the Act, 21 U.S.C. section 360bbb-3(b)(1), unless the authorization is terminated or revoked sooner.  Performed at [facility], [facility], [address] [place], [place] [id number]      Labs: BNP (last 3 results) Recent Labs    03/01/20 0622 03/23/20 2200 03/25/20 1109  BNP 550.7* 414.7* 601.1*   Basic  Metabolic Panel: Recent Labs  Lab 03/23/20 1334 03/24/20 0757 03/25/20 0552 03/26/20 0542 03/29/20 0333  NA 137 137 139 137 137  K 3.9 3.7 4.0 4.3 4.1  CL 106 103 104 108 105  CO2 22 22 24 23 24   GLUCOSE 116* 81 92 93 97  BUN 15 14 13 10 19   CREATININE 1.24 0.99 1.10 0.99 1.00  CALCIUM 8.7* 8.8* 8.5* 8.1* 8.8*  MG  --   --  1.9 1.8 2.0  PHOS  --   --  3.3 3.6  --    Liver Function Tests: Recent Labs  Lab 03/25/20 0552 03/26/20 0542  ALBUMIN 3.1* 3.0*   No results for input(s): LIPASE, AMYLASE in the last 168 hours. Recent Labs  Lab 03/23/20 2200 03/25/20 1109 03/26/20 0542  AMMONIA 34 69* 60*   CBC: Recent Labs  Lab 03/23/20 1334 03/24/20 0757 03/25/20 0552 03/26/20 0542 03/29/20 0333  WBC 5.2 6.2 4.8 4.8 5.5  HGB 11.9* 11.9* 11.3* 11.0* 11.5*  HCT 36.1* 34.5* 32.8* 33.2* 33.0*  MCV 96.0 92.7 92.7 95.1 91.2  PLT 134* 132* 121* 120* 175   Cardiac Enzymes: No results for input(s): CKTOTAL, CKMB, CKMBINDEX, TROPONINI in the last 168 hours. BNP: Invalid input(s): POCBNP CBG: No results for input(s): GLUCAP in the last 168 hours. D-Dimer No results for input(s): DDIMER in the last 72 hours. Hgb A1c No results for input(s): HGBA1C in the last 72 hours. Lipid Profile No results for input(s): CHOL, HDL, LDLCALC, TRIG, CHOLHDL, LDLDIRECT in the last 72 hours. Thyroid function studies No results for input(s): TSH, T4TOTAL, T3FREE, THYROIDAB in the last 72 hours.  Invalid input(s): FREET3 Anemia work up No results for input(s): VITAMINB12, FOLATE, FERRITIN, TIBC, IRON, RETICCTPCT in the last 72 hours. Urinalysis    Component Value Date/Time   COLORURINE YELLOW (A) 03/22/2020 1551   APPEARANCEUR CLEAR (A) 03/22/2020 1551   APPEARANCEUR Clear 12/19/2017 1424   LABSPEC 1.009 03/22/2020 1551   PHURINE 5.0 03/22/2020 1551   GLUCOSEU NEGATIVE 03/22/2020 1551   HGBUR NEGATIVE 03/22/2020 1551   BILIRUBINUR NEGATIVE 03/22/2020 1551   BILIRUBINUR Negative  12/19/2017 1424   KETONESUR NEGATIVE 03/22/2020 1551   PROTEINUR NEGATIVE 03/22/2020 1551   NITRITE NEGATIVE  03/22/2020 1551   LEUKOCYTESUR NEGATIVE 03/22/2020 1551   Sepsis Labs Invalid input(s): PROCALCITONIN,  WBC,  LACTICIDVEN Microbiology Recent Results (from the past 240 hour(s))  SARS Coronavirus 2 by RT PCR (hospital order, performed in [facility] hospital lab) Nasopharyngeal Nasopharyngeal Swab     Status: None   Collection Time: 03/22/20  1:56 PM   Specimen: Nasopharyngeal Swab  Result Value Ref Range Status   SARS Coronavirus 2 NEGATIVE NEGATIVE Final    Comment: (NOTE) SARS-CoV-2 target nucleic acids are NOT DETECTED.  The SARS-CoV-2 RNA is generally detectable in upper and lower respiratory specimens during the acute phase of infection. The lowest concentration of SARS-CoV-2 viral copies this assay can detect is 250 copies / mL. A negative result does not preclude SARS-CoV-2 infection and should not be used as the sole basis for treatment or other patient management decisions.  A negative result may occur with improper specimen collection / handling, submission of specimen other than nasopharyngeal swab, presence of viral mutation(s) within the areas targeted by this assay, and inadequate number of viral copies (<250 copies / mL). A negative result must be combined with clinical observations, patient history, and epidemiological information.  Fact Sheet for Patients:   BoilerBrush.com.cy  Fact Sheet for Healthcare Providers: https://pope.com/  This test is not yet approved or  cleared by the [place] FDA and has been authorized for detection and/or diagnosis of SARS-CoV-2 by FDA under an Emergency Use Authorization (EUA).  This EUA will remain in effect (meaning this test can be used) for the duration of the COVID-19 declaration under Section 564(b)(1) of the Act, 21 U.S.C. section 360bbb-3(b)(1), unless the  authorization is terminated or revoked sooner.  Performed at [facility], [address] [place], [place] [address]   SARS Coronavirus 2 by RT PCR (hospital order, performed in [facility] hospital lab) Nasopharyngeal Nasopharyngeal Swab     Status: None   Collection Time: 03/29/20 12:59 PM   Specimen: Nasopharyngeal Swab  Result Value Ref Range Status   SARS Coronavirus 2 NEGATIVE NEGATIVE Final    Comment: (NOTE) SARS-CoV-2 target nucleic acids are NOT DETECTED.  The SARS-CoV-2 RNA is generally detectable in upper and lower respiratory specimens during the acute phase of infection. The lowest concentration of SARS-CoV-2 viral copies this assay can detect is 250 copies / mL. A negative result does not preclude SARS-CoV-2 infection and should not be used as the sole basis for treatment or other patient management decisions.  A negative result may occur with improper specimen collection / handling, submission of specimen other than nasopharyngeal swab, presence of viral mutation(s) within the areas targeted by this assay, and inadequate number of viral copies (<250 copies / mL). A negative result must be combined with clinical observations, patient history, and epidemiological information.  Fact Sheet for Patients:   BoilerBrush.com.cy  Fact Sheet for Healthcare Providers: https://pope.com/  This test is not yet approved or  cleared by the [place] FDA and has been authorized for detection and/or diagnosis of SARS-CoV-2 by FDA under an Emergency Use Authorization (EUA).  This EUA will remain in effect (meaning this test can be used) for the duration of the COVID-19 declaration under Section 564(b)(1) of the Act, 21 U.S.C. section 360bbb-3(b)(1), unless the authorization is terminated or revoked sooner.  Performed at [facility], [address] [place], [place] [address]      Time coordinating  discharge:  I have spent 35 minutes face to face with the patient and  on the ward discussing the patients care, assessment, plan and disposition with other care givers. >50% of the time was devoted counseling the patient about the risks and benefits of treatment/Discharge disposition and coordinating care.   SIGNED:   Dimple Nanas, MD  [facility] 03/29/2020, 3:40 PM   If 7PM-7AM, please contact night-coverage

## 2020-03-29 NOTE — TOC Transition Note (Signed)
Transition of Care [facility]) - CM/SW Discharge Note   Patient Details  Name: Raymond [name] MRN: [id number] Date of Birth: September 22, 1960  [facility] CM/SW Contact:  Maree Krabbe, LCSW Phone Number: 03/29/2020, 3:44 PM   Clinical Narrative:   Clinical Social Worker facilitated patient discharge including contacting patient family and facility to confirm patient discharge plans.  Clinical information faxed to facility and family agreeable with plan.  CSW arranged ambulance transport via [facility] to [facility] .  RN to call [phone] for report prior to discharge.    Final next level of care: Skilled Nursing Facility Barriers to Discharge: No Barriers Identified   Patient Goals and CMS Choice Patient states their goals for this hospitalization and ongoing recovery are:: to get better   Choice offered to / list presented to : [facility] POA / Guardian  Discharge Placement              Patient chooses bed at: [facility] Patient to be transferred to facility by: [facility] Name of family member notified: Lashea Patient and family notified of of transfer: 03/29/20  Discharge Plan and Services In-house Referral: Clinical Social Work   Post Acute Care Choice: Skilled Nursing Facility                               Social Determinants of Health (SDOH) Interventions     Readmission Risk Interventions No flowsheet data found.

## 2020-03-29 NOTE — Progress Notes (Signed)
Attempted to call report x4, pt has been transported. Called a 5th time at 6:05pm....Marland Kitchenstill holding! On hold 7 minutes. Spoke to [employer]

## 2020-04-06 ENCOUNTER — Non-Acute Institutional Stay: Payer: Medicare Other | Admitting: Nurse Practitioner

## 2020-04-06 ENCOUNTER — Other Ambulatory Visit: Payer: Self-pay

## 2020-04-06 ENCOUNTER — Encounter: Payer: Self-pay | Admitting: Nurse Practitioner

## 2020-04-06 VITALS — BP 98/66 | Temp 97.1°F

## 2020-04-06 DIAGNOSIS — Z515 Encounter for palliative care: Secondary | ICD-10-CM

## 2020-04-06 DIAGNOSIS — I5022 Chronic systolic (congestive) heart failure: Secondary | ICD-10-CM
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
## 2020-04-13 ENCOUNTER — Non-Acute Institutional Stay: Payer: Medicare Other | Admitting: Nurse Practitioner

## 2020-04-13 ENCOUNTER — Encounter: Payer: Self-pay | Admitting: Nurse Practitioner

## 2020-04-13 ENCOUNTER — Other Ambulatory Visit: Payer: Self-pay

## 2020-04-13 VITALS — Wt 127.6 lb

## 2020-04-13 DIAGNOSIS — I5022 Chronic systolic (congestive) heart failure: Secondary | ICD-10-CM

## 2020-04-13 DIAGNOSIS — Z515 Encounter for palliative care: Secondary | ICD-10-CM
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
## 2020-04-22 ENCOUNTER — Encounter: Payer: Self-pay | Admitting: Radiology

## 2020-04-22 ENCOUNTER — Emergency Department: Payer: Medicare Other

## 2020-04-22 ENCOUNTER — Inpatient Hospital Stay
Admission: EM | Admit: 2020-04-22 | Discharge: 2020-05-03 | DRG: 642 | Disposition: A | Discharge status: Skilled Nursing Facility | Payer: Medicare Other | Source: Skilled Nursing Facility | Attending: Family Medicine | Admitting: Family Medicine

## 2020-04-22 DIAGNOSIS — I5022 Chronic systolic (congestive) heart failure: Secondary | ICD-10-CM | POA: Diagnosis present

## 2020-04-22 DIAGNOSIS — I9589 Other hypotension: Secondary | ICD-10-CM

## 2020-04-22 DIAGNOSIS — Z79899 Other long term (current) drug therapy: Secondary | ICD-10-CM

## 2020-04-22 DIAGNOSIS — E7220 Disorder of urea cycle metabolism, unspecified: Secondary | ICD-10-CM

## 2020-04-22 DIAGNOSIS — R7989 Other specified abnormal findings of blood chemistry: Secondary | ICD-10-CM

## 2020-04-22 DIAGNOSIS — D696 Thrombocytopenia, unspecified: Secondary | ICD-10-CM | POA: Diagnosis present

## 2020-04-22 DIAGNOSIS — F429 Obsessive-compulsive disorder, unspecified: Secondary | ICD-10-CM | POA: Diagnosis present

## 2020-04-22 DIAGNOSIS — R1312 Dysphagia, oropharyngeal phase: Secondary | ICD-10-CM | POA: Diagnosis present

## 2020-04-22 DIAGNOSIS — R4182 Altered mental status, unspecified: Secondary | ICD-10-CM

## 2020-04-22 DIAGNOSIS — Z20822 Contact with and (suspected) exposure to covid-19: Secondary | ICD-10-CM | POA: Diagnosis present

## 2020-04-22 DIAGNOSIS — R4701 Aphasia: Secondary | ICD-10-CM | POA: Diagnosis present

## 2020-04-22 DIAGNOSIS — I110 Hypertensive heart disease with heart failure: Secondary | ICD-10-CM | POA: Diagnosis present

## 2020-04-22 DIAGNOSIS — G9341 Metabolic encephalopathy: Secondary | ICD-10-CM | POA: Diagnosis present

## 2020-04-22 DIAGNOSIS — Z0189 Encounter for other specified special examinations: Secondary | ICD-10-CM

## 2020-04-22 DIAGNOSIS — K219 Gastro-esophageal reflux disease without esophagitis: Secondary | ICD-10-CM | POA: Diagnosis present

## 2020-04-22 DIAGNOSIS — E861 Hypovolemia: Secondary | ICD-10-CM

## 2020-04-22 DIAGNOSIS — T426X5A Adverse effect of other antiepileptic and sedative-hypnotic drugs, initial encounter: Secondary | ICD-10-CM | POA: Diagnosis present

## 2020-04-22 DIAGNOSIS — I480 Paroxysmal atrial fibrillation: Secondary | ICD-10-CM | POA: Diagnosis present

## 2020-04-22 DIAGNOSIS — E785 Hyperlipidemia, unspecified: Secondary | ICD-10-CM | POA: Diagnosis present

## 2020-04-22 DIAGNOSIS — Z4659 Encounter for fitting and adjustment of other gastrointestinal appliance and device: Secondary | ICD-10-CM

## 2020-04-22 DIAGNOSIS — I959 Hypotension, unspecified: Secondary | ICD-10-CM | POA: Diagnosis present

## 2020-04-22 DIAGNOSIS — E43 Unspecified severe protein-calorie malnutrition: Secondary | ICD-10-CM | POA: Diagnosis present

## 2020-04-22 DIAGNOSIS — E8809 Other disorders of plasma-protein metabolism, not elsewhere classified: Secondary | ICD-10-CM

## 2020-04-22 DIAGNOSIS — K7200 Acute and subacute hepatic failure without coma: Secondary | ICD-10-CM

## 2020-04-22 DIAGNOSIS — Z7982 Long term (current) use of aspirin: Secondary | ICD-10-CM

## 2020-04-22 DIAGNOSIS — E860 Dehydration: Secondary | ICD-10-CM | POA: Diagnosis not present

## 2020-04-22 DIAGNOSIS — F79 Unspecified intellectual disabilities: Secondary | ICD-10-CM | POA: Diagnosis present

## 2020-04-22 DIAGNOSIS — G40909 Epilepsy, unspecified, not intractable, without status epilepticus: Secondary | ICD-10-CM

## 2020-04-22 DIAGNOSIS — E871 Hypo-osmolality and hyponatremia: Secondary | ICD-10-CM | POA: Diagnosis present

## 2020-04-22 DIAGNOSIS — R946 Abnormal results of thyroid function studies: Secondary | ICD-10-CM | POA: Diagnosis present

## 2020-04-22 DIAGNOSIS — G9340 Encephalopathy, unspecified: Secondary | ICD-10-CM | POA: Diagnosis present

## 2020-04-22 DIAGNOSIS — Z681 Body mass index (BMI) 19 or less, adult: Secondary | ICD-10-CM

## 2020-04-22 DIAGNOSIS — Z66 Do not resuscitate: Secondary | ICD-10-CM | POA: Diagnosis present

## 2020-04-22 DIAGNOSIS — I340 Nonrheumatic mitral (valve) insufficiency: Secondary | ICD-10-CM | POA: Diagnosis present

## 2020-04-22 DIAGNOSIS — R410 Disorientation, unspecified: Secondary | ICD-10-CM

## 2020-04-22 LAB — COMPREHENSIVE METABOLIC PANEL
ALT: 16 U/L (ref 0–44)
AST: 33 U/L (ref 15–41)
Albumin: 2.9 g/dL — ABNORMAL LOW (ref 3.5–5.0)
Alkaline Phosphatase: 27 U/L — ABNORMAL LOW (ref 38–126)
Anion gap: 12 (ref 5–15)
BUN: 14 mg/dL (ref 6–20)
CO2: 22 mmol/L (ref 22–32)
Calcium: 8.7 mg/dL — ABNORMAL LOW (ref 8.9–10.3)
Chloride: 98 mmol/L (ref 98–111)
Creatinine, Ser: 0.94 mg/dL (ref 0.61–1.24)
GFR calc Af Amer: >60 mL/min (ref >60)
GFR calc non Af Amer: >60 mL/min (ref >60)
Glucose, Bld: 111 mg/dL — ABNORMAL HIGH (ref 70–99)
Potassium: 4 mmol/L (ref 3.5–5.1)
Sodium: 132 mmol/L — ABNORMAL LOW (ref 135–145)
Total Bilirubin: 1.3 mg/dL — ABNORMAL HIGH (ref 0.3–1.2)
Total Protein: 6.1 g/dL — ABNORMAL LOW (ref 6.5–8.1)

## 2020-04-22 LAB — DIGOXIN LEVEL: Digoxin Level: 0.2 ng/mL — ABNORMAL LOW (ref 1.0–2.0)

## 2020-04-22 LAB — CBC WITH DIFFERENTIAL/PLATELET
Abs Immature Granulocytes: 0.05 10*3/uL (ref 0.00–0.07)
Basophils Absolute: 0 10*3/uL (ref 0.0–0.1)
Basophils Relative: 0 %
Eosinophils Absolute: 0 10*3/uL (ref 0.0–0.5)
Eosinophils Relative: 1 %
HCT: 34 % — ABNORMAL LOW (ref 39.0–52.0)
Hemoglobin: 11.4 g/dL — ABNORMAL LOW (ref 13.0–17.0)
Immature Granulocytes: 1 %
Lymphocytes Relative: 22 %
Lymphs Abs: 1.9 10*3/uL (ref 0.7–4.0)
MCH: 31.9 pg (ref 26.0–34.0)
MCHC: 33.5 g/dL (ref 30.0–36.0)
MCV: 95.2 fL (ref 80.0–100.0)
Monocytes Absolute: 0.9 10*3/uL (ref 0.1–1.0)
Monocytes Relative: 11 %
Neutro Abs: 5.6 10*3/uL (ref 1.7–7.7)
Neutrophils Relative %: 65 %
Platelets: 123 10*3/uL — ABNORMAL LOW (ref 150–400)
RBC: 3.57 MIL/uL — ABNORMAL LOW (ref 4.22–5.81)
RDW: 14.7 % (ref 11.5–15.5)
WBC: 8.5 10*3/uL (ref 4.0–10.5)
nRBC: 0 % (ref 0.0–0.2)

## 2020-04-22 LAB — BLOOD GAS, VENOUS
Acid-Base Excess: 0.2 mmol/L (ref 0.0–2.0)
Bicarbonate: 24.6 mmol/L (ref 20.0–28.0)
O2 Saturation: 93.6 %
Patient temperature: 37
pCO2, Ven: 38 mmHg — ABNORMAL LOW (ref 44.0–60.0)
pH, Ven: 7.42 (ref 7.250–7.430)
pO2, Ven: 68 mmHg — ABNORMAL HIGH (ref 32.0–45.0)

## 2020-04-22 LAB — RESPIRATORY PANEL BY RT PCR (FLU A&B, COVID)
Influenza A by PCR: NEGATIVE
Influenza B by PCR: NEGATIVE
SARS Coronavirus 2 by RT PCR: NEGATIVE

## 2020-04-22 LAB — TROPONIN I (HIGH SENSITIVITY): Troponin I (High Sensitivity): 10 ng/L (ref <18)

## 2020-04-22 LAB — URINALYSIS, COMPLETE (UACMP) WITH MICROSCOPIC
Glucose, UA: NEGATIVE mg/dL
Hgb urine dipstick: NEGATIVE
Ketones, ur: NEGATIVE mg/dL
Leukocytes,Ua: NEGATIVE
Nitrite: NEGATIVE
Protein, ur: 30 mg/dL — AB
Specific Gravity, Urine: 1.025 (ref 1.005–1.030)
pH: 5 (ref 5.0–8.0)

## 2020-04-22 LAB — URINE DRUG SCREEN, QUALITATIVE (ARMC ONLY)
Amphetamines, Ur Screen: NOT DETECTED
Barbiturates, Ur Screen: NOT DETECTED
Benzodiazepine, Ur Scrn: NOT DETECTED
Cannabinoid 50 Ng, Ur ~~LOC~~: NOT DETECTED
Cocaine Metabolite,Ur ~~LOC~~: NOT DETECTED
MDMA (Ecstasy)Ur Screen: POSITIVE — AB
Methadone Scn, Ur: NOT DETECTED
Opiate, Ur Screen: NOT DETECTED
Phencyclidine (PCP) Ur S: NOT DETECTED
Tricyclic, Ur Screen: NOT DETECTED

## 2020-04-22 LAB — MAGNESIUM: Magnesium: 1.7 mg/dL (ref 1.7–2.4)

## 2020-04-22 LAB — TSH
TSH: 1.873 u[IU]/mL (ref 0.350–4.500)
TSH: 1.923 u[IU]/mL (ref 0.350–4.500)

## 2020-04-22 LAB — PROTIME-INR
INR: 1.3 — ABNORMAL HIGH (ref 0.8–1.2)
Prothrombin Time: 15.4 seconds — ABNORMAL HIGH (ref 11.4–15.2)

## 2020-04-22 LAB — LACTIC ACID, PLASMA: Lactic Acid, Venous: 1 mmol/L (ref 0.5–1.9)

## 2020-04-22 LAB — AMMONIA: Ammonia: 52 umol/L — ABNORMAL HIGH (ref 9–35)

## 2020-04-22 LAB — VALPROIC ACID LEVEL: Valproic Acid Lvl: 77 ug/mL (ref 50.0–100.0)

## 2020-04-22 MED ORDER — SODIUM CHLORIDE 0.9 % IV SOLN: INTRAVENOUS | Status: DC

## 2020-04-22 MED ORDER — ONDANSETRON HCL 4 MG PO TABS: 4.0000 mg | ORAL_TABLET | Freq: Four times a day (QID) | ORAL | Status: DC | PRN

## 2020-04-22 MED ORDER — FENOFIBRATE 160 MG PO TABS
160.0000 mg | ORAL_TABLET | Freq: Every day | ORAL | Status: DC
  Administered 2020-04-23 – 2020-04-26 (×3): 160 mg via ORAL
  Filled 2020-04-22 (×7): qty 1

## 2020-04-22 MED ORDER — LACTATED RINGERS IV BOLUS
1000.0000 mL | Freq: Once | INTRAVENOUS | Status: AC
  Administered 2020-04-22: 1000 mL via INTRAVENOUS

## 2020-04-22 MED ORDER — RISPERIDONE 0.5 MG PO TABS
0.5000 mg | ORAL_TABLET | Freq: Every day | ORAL | Status: DC
  Administered 2020-04-24 – 2020-04-26 (×3): 0.5 mg via ORAL
  Filled 2020-04-22 (×5): qty 1

## 2020-04-22 MED ORDER — LEVETIRACETAM IN NACL 1000 MG/100ML IV SOLN
1000.0000 mg | Freq: Two times a day (BID) | INTRAVENOUS | Status: AC
  Administered 2020-04-22 – 2020-04-23 (×3): 1000 mg via INTRAVENOUS
  Filled 2020-04-22 (×3): qty 100

## 2020-04-22 MED ORDER — ONDANSETRON HCL 4 MG/2ML IJ SOLN: 4.0000 mg | Freq: Four times a day (QID) | INTRAMUSCULAR | Status: DC | PRN

## 2020-04-22 MED ORDER — PANTOPRAZOLE SODIUM 40 MG PO TBEC
40.0000 mg | DELAYED_RELEASE_TABLET | Freq: Every day | ORAL | Status: DC
  Administered 2020-04-23 – 2020-04-26 (×4): 40 mg via ORAL
  Filled 2020-04-22 (×4): qty 1

## 2020-04-22 MED ORDER — AMIODARONE HCL 200 MG PO TABS
200.0000 mg | ORAL_TABLET | Freq: Every day | ORAL | Status: DC
  Administered 2020-04-23 – 2020-04-26 (×4): 200 mg via ORAL
  Filled 2020-04-22 (×6): qty 1

## 2020-04-22 MED ORDER — MAGNESIUM SULFATE 2 GM/50ML IV SOLN
2.0000 g | Freq: Once | INTRAVENOUS | Status: AC
  Administered 2020-04-22: 2 g via INTRAVENOUS
  Filled 2020-04-22: qty 50

## 2020-04-22 MED ORDER — TRAZODONE HCL 50 MG PO TABS: 25.0000 mg | ORAL_TABLET | Freq: Every evening | ORAL | Status: DC | PRN

## 2020-04-22 MED ORDER — IOHEXOL 350 MG/ML SOLN
75.0000 mL | Freq: Once | INTRAVENOUS | Status: AC | PRN
  Administered 2020-04-22: 75 mL via INTRAVENOUS

## 2020-04-22 MED ORDER — LACTULOSE 10 GM/15ML PO SOLN
20.0000 g | Freq: Three times a day (TID) | ORAL | Status: DC
  Administered 2020-04-23 – 2020-04-26 (×9): 20 g via ORAL
  Filled 2020-04-22 (×10): qty 30

## 2020-04-22 MED ORDER — SODIUM CHLORIDE 0.45 % IV BOLUS
250.0000 mL | Freq: Once | INTRAVENOUS | Status: AC
  Administered 2020-04-22: 250 mL via INTRAVENOUS
  Filled 2020-04-22: qty 250

## 2020-04-22 MED ORDER — MECLIZINE HCL 12.5 MG PO TABS
12.5000 mg | ORAL_TABLET | Freq: Three times a day (TID) | ORAL | Status: DC | PRN
  Filled 2020-04-22: qty 1

## 2020-04-22 MED ORDER — RISPERIDONE 1 MG PO TABS
1.0000 mg | ORAL_TABLET | Freq: Every day | ORAL | Status: DC
  Administered 2020-04-23 – 2020-04-25 (×3): 1 mg via ORAL
  Filled 2020-04-22 (×7): qty 1

## 2020-04-22 MED ORDER — BENEFIBER PO POWD: Freq: Every day | ORAL | Status: DC

## 2020-04-22 MED ORDER — DIGOXIN 125 MCG PO TABS
0.0625 mg | ORAL_TABLET | Freq: Every day | ORAL | Status: DC
  Administered 2020-04-23 – 2020-04-26 (×4): 0.0625 mg via ORAL
  Filled 2020-04-22 (×8): qty 0.5

## 2020-04-22 MED ORDER — MAGNESIUM HYDROXIDE 400 MG/5ML PO SUSP: 30.0000 mL | Freq: Every day | ORAL | Status: DC | PRN

## 2020-04-22 MED ORDER — ACETAMINOPHEN 325 MG PO TABS: 650.0000 mg | ORAL_TABLET | Freq: Four times a day (QID) | ORAL | Status: DC | PRN

## 2020-04-22 MED ORDER — ACETAMINOPHEN 650 MG RE SUPP: 650.0000 mg | Freq: Four times a day (QID) | RECTAL | Status: DC | PRN

## 2020-04-22 MED ORDER — VITAMIN B-12 1000 MCG PO TABS
1000.0000 ug | ORAL_TABLET | Freq: Every day | ORAL | Status: DC
  Administered 2020-04-23 – 2020-04-26 (×4): 1000 ug via ORAL
  Filled 2020-04-22 (×6): qty 1

## 2020-04-22 MED ORDER — ASPIRIN EC 81 MG PO TBEC
81.0000 mg | DELAYED_RELEASE_TABLET | Freq: Every day | ORAL | Status: DC
  Administered 2020-04-23 – 2020-04-26 (×4): 81 mg via ORAL
  Filled 2020-04-22 (×4): qty 1

## 2020-04-22 MED ORDER — FAMOTIDINE 20 MG PO TABS
40.0000 mg | ORAL_TABLET | Freq: Every day | ORAL | Status: DC
  Administered 2020-04-23 – 2020-04-25 (×3): 40 mg via ORAL
  Filled 2020-04-22 (×4): qty 2

## 2020-04-22 MED ORDER — ENOXAPARIN SODIUM 40 MG/0.4ML ~~LOC~~ SOLN
40.0000 mg | SUBCUTANEOUS | Status: DC
  Administered 2020-04-23 – 2020-05-02 (×11): 40 mg via SUBCUTANEOUS
  Filled 2020-04-22 (×12): qty 0.4

## 2020-04-22 NOTE — H&P (Signed)
[place]   PATIENT NAME: Raymond SinghCharles Birden    MR#:  [id number]  DATE OF BIRTH:  07-Feb-1961  DATE OF ADMISSION:  04/22/2020  PRIMARY CARE PHYSICIAN: Nira [facility], [facility]   REQUESTING/REFERRING PHYSICIAN: Antoine PrimasSmith, Zachary, MD  CHIEF COMPLAINT:   Chief Complaint  Patient presents with  . Altered Mental Status    HISTORY OF PRESENT ILLNESS:  Raymond [name]  is a 59 y.o. Caucasian male coming from [facility] with a known history of seizure disorder, mental retardation, obsessive-compulsive disorder, dyslipidemia and severe mitral regurgitation and CHF, who presented to the emergency room with acute onset of altered mental status.  He was found on the floor at his SNF.  No reported dysuria, oliguria or hematuria or flank pain.  No reported cough or wheezing or dyspnea.  No reported chest pain or palpitations.  The patient is a very poor historian due to his altered mental status as well as underlying developmental delay.  Upon presentation to emergency room, blood pressure was 88/61 with otherwise normal vital signs.  It has come up to 102/75 with hydration and later on was down to 88/62.  Labs revealed mild hyponatremia 132 and albumin of 2.9 with total protein of 6.1, total bili of 1.3.  Ammonia level was elevated at 52.  High-sensitivity troponin was 10 and lactic acid 1.  CBC showed mild anemia and TSH came back 1.8.  Respiratory panel including COVID-19 PCR came back negative.  Urinalysis was unremarkable.  Urine drug screen was positive for MDMA.  He had head and neck CT scan revealing mild bilateral intracavernous ICA narrowing secondary to atherosclerotic disease with no large vessel occlusion or high-grade narrowing or dissection or aneurysm.  KG showed atrial fibrillation with controlled ventricular response of 97 with borderline intraventricular conduction delay and T wave inversion laterally.  The patient was given 1 L bolus of IV lactated Ringer and 1 g of IV  Keppra.  He will be admitted to an observation medically monitored bed for further evaluation and management. PAST MEDICAL HISTORY:   Past Medical History:  Diagnosis Date  . Epilepsy (HCC)   . Epileptic seizures (HCC)   . Hyperlipidemia   . Mental retardation   . OCD (obsessive compulsive disorder)    water drinking  . OCD (obsessive compulsive disorder)   . Severe mitral regurgitation    a. 02/2016 Echo: EF 55-60%, no rwma, MVP of posterior leaflet w/ an anteriorly directed MR - severe. Sev dil LA; b. TTE 8/18: EF of 55-60%, normal wall motion, normal LV diastolic function parameters, severe mitral prolapse involving the anterior leaflet and posterior leaflet with severe regurgitation directed anteriorly, severely dilated left atrium, unable to estimate the PASP  . Systolic murmur   CHF, atrial fibrillation  PAST SURGICAL HISTORY:   Past Surgical History:  Procedure Laterality Date  . COLONOSCOPY WITH PROPOFOL N/A 03/23/2019   Procedure: COLONOSCOPY WITH PROPOFOL;  Surgeon: Toledo, Boykin Nearingeodoro K, MD;  Location: [facility] ENDOSCOPY;  Service: Gastroenterology;  Laterality: N/A;  . ESOPHAGOGASTRODUODENOSCOPY (EGD) WITH PROPOFOL N/A 03/23/2019   Procedure: ESOPHAGOGASTRODUODENOSCOPY (EGD) WITH PROPOFOL;  Surgeon: Toledo, Boykin Nearingeodoro K, MD;  Location: [facility] ENDOSCOPY;  Service: Gastroenterology;  Laterality: N/A;  NEEDS A RAPID    SOCIAL HISTORY:   Social History   Tobacco Use  . Smoking status: Never Smoker  . Smokeless tobacco: Never Used  Substance Use Topics  . Alcohol use: No    Alcohol/week: 0.0 standard drinks    FAMILY HISTORY:  Family History  Family history unknown: Yes  Unobtainable  DRUG ALLERGIES:  No Known Allergies  REVIEW OF SYSTEMS:   ROS As per history of present illness. All pertinent systems were reviewed above. Constitutional, HEENT, cardiovascular, respiratory, GI, GU, musculoskeletal, neuro, psychiatric, endocrine, integumentary and hematologic systems were  reviewed and are otherwise negative/unremarkable except for positive findings mentioned above in the HPI.   MEDICATIONS AT HOME:   Prior to Admission medications   Medication Sig Start Date End Date Taking? Authorizing Provider  albuterol (PROVENTIL HFA;VENTOLIN HFA) 108 (90 Base) MCG/ACT inhaler Inhale 2 puffs into the lungs every 6 (six) hours as needed for wheezing or shortness of breath. 07/26/16   Particia Nearing, PA-C  amiodarone (PACERONE) 200 MG tablet Take 1 tablet (200 mg total) by mouth daily. Start taking this dosage on March 16, 2020 03/15/20 04/14/20  Charise Killian, MD  aspirin EC 81 MG tablet Take 81 mg by mouth daily.    [provider]  Cholecalciferol (VITAMIN D) 50 MCG (2000 UT) CAPS Take 2,000 Units by mouth daily.     [provider]  Cyanocobalamin (VITAMIN B 12 PO) Take 1,000 mcg by mouth daily.     [provider]  digoxin 62.5 MCG TABS Take 0.0625 mg by mouth daily. 03/11/20 04/10/20  Charise Killian, MD  divalproex (DEPAKOTE) 500 MG DR tablet TAKE 1 TABLET BY MOUTH THREE TIMES DAILY WITH MEALS. Patient taking differently: Take 500 mg by mouth with breakfast, with lunch, and with evening meal.  07/02/16   Steele Sizer, MD  Ergocalciferol (VITAMIN D2 PO) Take 1.25 mg by mouth once a week.    [provider]  famotidine (PEPCID) 40 MG tablet Take 40 mg by mouth at bedtime.    [provider]  fenofibrate (TRICOR) 145 MG tablet TAKE 1 TABLET BY MOUTH ONCE DAILY FOR LIPIDS. Patient taking differently: Take 145 mg by mouth daily.  01/02/17   Steele Sizer, MD  fluticasone (FLONASE) 50 MCG/ACT nasal spray Place 2 sprays into both nostrils daily. Use PRN 02/23/16   Steele Sizer, MD  guaiFENesin (MUCINEX) 600 MG 12 hr tablet Take 1 tablet (600 mg total) by mouth 2 (two) times daily as needed. 04/24/18   Johnson, Megan P, DO  levETIRAcetam (KEPPRA) 1000 MG tablet Take 1 tablet (1,000 mg total) by mouth daily.  03/11/20 04/10/20  Charise Killian, MD  levETIRAcetam (KEPPRA) 750 MG tablet Take 2 tablets (1,500 mg total) by mouth every evening. 03/10/20 04/09/20  Charise Killian, MD  loratadine (CLARITIN) 10 MG tablet TAKE 1 TABLET BY MOUTH ONCE DAILY. Patient taking differently: Take 10 mg by mouth daily as needed for allergies.  04/02/17   Johnson, Megan P, DO  meclizine (ANTIVERT) 25 MG tablet Take 25 mg by mouth 3 (three) times daily as needed for dizziness.    [provider]  Multiple Vitamin (MULTIVITAMIN WITH MINERALS) TABS tablet Take 1 tablet by mouth daily.    [provider]  Nutritional Supplements (ENSURE PO) Take 1 Can by mouth daily.    [provider]  omeprazole (PRILOSEC) 40 MG capsule Take 40 mg by mouth daily.    [provider]  risperiDONE (RISPERDAL) 0.5 MG tablet Take 1 tablet (0.5 mg total) by mouth daily. 03/30/20   Amin, Loura Halt, MD  risperiDONE (RISPERDAL) 1 MG tablet Take 1 tablet (1 mg total) by mouth at bedtime. 03/29/20   Dimple Nanas, MD  Wheat Dextrin (  BENEFIBER PO) Take by mouth daily. 2 ? in 8 oz QD    [provider]      VITAL SIGNS:  Blood pressure 92/72, pulse (!) 108, temperature 98.9 F (37.2 C), temperature source Rectal, resp. rate 19, height 6' (1.829 m), weight 58 kg, SpO2 98 %.  PHYSICAL EXAMINATION:  Physical Exam  GENERAL:  59 y.o.-year-old Caucasian male patient lying in the bed with no acute distress.  He was very somnolent but opening his eyes and very slow to answer questions. EYES: Pupils equal, round, reactive to light and accommodation. No scleral icterus. Extraocular muscles intact.  HEENT: Head atraumatic, normocephalic. Oropharynx and nasopharynx clear.  NECK:  Supple, no jugular venous distention. No thyroid enlargement, no tenderness.  LUNGS: Normal breath sounds bilaterally, no wheezing, rales,rhonchi or crepitation. No use of accessory muscles of respiration.  CARDIOVASCULAR: Regular  rate and rhythm, S1, S2 normal. No murmurs, rubs, or gallops.  ABDOMEN: Soft, nondistended, nontender. Bowel sounds present. No organomegaly or mass.  EXTREMITIES: No pedal edema, cyanosis, or clubbing.  NEUROLOGIC: Cranial nerves II through XII are intact. Muscle strength 5/5 in all extremities. Sensation intact. Gait not checked.  PSYCHIATRIC: The patient is somnolent but arousable.   SKIN: No obvious rash, lesion, or ulcer.   LABORATORY PANEL:   CBC Recent Labs  Lab 04/22/20 1605  WBC 8.5  HGB 11.4*  HCT 34.0*  PLT 123*   ------------------------------------------------------------------------------------------------------------------  Chemistries  Recent Labs  Lab 04/22/20 1605  NA 132*  K 4.0  CL 98  CO2 22  GLUCOSE 111*  BUN 14  CREATININE 0.94  CALCIUM 8.7*  MG 1.7  AST 33  ALT 16  ALKPHOS 27*  BILITOT 1.3*   ------------------------------------------------------------------------------------------------------------------  Cardiac Enzymes No results for input(s): TROPONINI in the last 168 hours. ------------------------------------------------------------------------------------------------------------------  RADIOLOGY:  CT Angio Head W or Wo Contrast  Result Date: 04/22/2020 CLINICAL DATA:  Neuro deficit EXAM: CT ANGIOGRAPHY HEAD TECHNIQUE: Multidetector CT imaging of the head was performed using the standard protocol during bolus administration of intravenous contrast. Multiplanar CT image reconstructions and MIPs were obtained to evaluate the vascular anatomy. CONTRAST:  54mL OMNIPAQUE IOHEXOL 350 MG/ML SOLN COMPARISON:  03/26/2020 MRI head and prior.  03/23/2020 head CT. FINDINGS: CT HEAD Brain: No acute infarct or intracranial hemorrhage. Moderate cerebral atrophy with ex vacuo dilatation. Scattered and confluent hypodense foci involving the periventricular and subcortical white matter nonspecific however commonly associated with chronic microvascular  ischemic changes. No mass lesion. No midline shift, ventriculomegaly or extra-axial fluid collection. Vascular: No hyperdense vessel or unexpected calcification. Bilateral skull base atherosclerotic calcifications. Skull: Negative for fracture or focal lesion. Sinuses/Orbits: Normal orbits. Clear paranasal sinuses. No mastoid effusion. Other: None. CTA HEAD Anterior circulation: Left A1 segment hypoplasia. Bilateral anterior and middle cerebral arteries are otherwise unremarkable. Bilateral carotid siphon atherosclerotic calcifications with mild narrowing of the intracavernous ICAs. Patent and normal caliber bilateral P comms. No significant stenosis, proximal occlusion, aneurysm, or vascular malformation. Posterior circulation: Dominant left vertebral artery. Patent normal caliber basilar, superior cerebellar and posterior cerebral arteries. No significant stenosis, proximal occlusion, aneurysm, or vascular malformation. Venous sinuses: As permitted by contrast timing, patent. Anatomic variants: None. IMPRESSION: CT HEAD: No acute intracranial process. Moderate cerebral atrophy and chronic microvascular ischemic changes. CTA HEAD: Mild bilateral intracavernous ICA narrowing secondary to atherosclerotic disease. No large vessel occlusion, high-grade narrowing, dissection or aneurysm. Electronically Signed   By: Stana Bunting M.D.   On: 04/22/2020 19:53   DG Chest 1 View  Result Date: 04/22/2020 CLINICAL DATA:  Altered mental status. EXAM: CHEST  1 VIEW COMPARISON:  03/23/2020 FINDINGS: The heart is enlarged but stable. Stable tortuosity and calcification of the thoracic aorta. No infiltrates, edema or effusions. No pulmonary lesions or pneumothorax. The bony thorax is intact. IMPRESSION: 1. Stable cardiac enlargement. 2. No acute pulmonary findings. Electronically Signed   By: Rudie Meyer M.D.   On: 04/22/2020 16:35   DG Pelvis Portable  Result Date: 04/22/2020 CLINICAL DATA:  Pelvic pain. EXAM:  PORTABLE PELVIS 1-2 VIEWS COMPARISON:  None. FINDINGS: Both hips are normally located. No definite hip fractures. The pubic symphysis and SI joints are intact. No definite pelvic fractures. IMPRESSION: No acute bony findings. Electronically Signed   By: Rudie Meyer M.D.   On: 04/22/2020 16:36      IMPRESSION AND PLAN:   1.  Acute metabolic encephalopathy likely hepatic encephalopathy given elevated ammonia level.  This is likely worsened by volume depletion and dehydration. -The patient will be admitted to an observation medical monitored bed. -She will be placed on p.o. lactulose. -We will follow neuro checks every 4 hours for 24 hours. -We will follow daily ammonia level.  2.  Hypotension. -This could be likely related to volume depletion and dehydration. -He will be aggressively hydrated with IV normal saline.  3.  Hyponatremia with borderline magnesium level. -This is mild and is likely related to volume depletion. -As mentioned above he will be hydrated with IV normal saline and will follow his BMP. -We will optimize his magnesium.  4.  Seizure disorder. -When checking Keppra and Depakote levels.  He was given IV Keppra in the ER. -She will be placed on seizure precautions.  5.  History of hypertension. -His Coreg and Cozaar will be held off given his current hypotension.  6.  Paroxysmal atrial fibrillation.  He has been occasionally with rapid ventricular response during my interview up to 113 and later was down to 97. -We will continue amiodarone, digoxin after checking level and aspirin. -He is apparently a fall risk and therefore is not on anticoagulation. -We will optimize his magnesium level as mentioned above.  7.  Dyslipidemia. -We will continue his fenofibrate.  8.  Intellectual disability/mental retardation with behavioral changes. -We will continue as needed Risperdal to be held off for sedation.  9.  GERD. -PPI therapy will be resumed.  10.  DVT  prophylaxis. -Subcutaneous Lovenox.  All the records are reviewed and case discussed with ED provider. The plan of care was discussed in details with the patient (and family). I answered all questions. The patient agreed to proceed with the above mentioned plan. Further management will depend upon hospital course.   CODE STATUS: The patient is DNR/DNI.  Status is: Observation  The patient remains OBS appropriate and will d/c before 2 midnights.  Dispo: The patient is from: Home              Anticipated d/c is to: Home              Anticipated d/c date is: 1 day              Patient currently is not medically stable to d/c.   TOTAL TIME TAKING CARE OF THIS PATIENT: 55 minutes.    Hannah Beat M.D on 04/22/2020 at 8:20 PM  [facility]   From 7 PM-7 AM, contact night-coverage [email or web address]  CC: Primary care physician; [name]

## 2020-04-22 NOTE — ED Provider Notes (Signed)
[facility] Emergency Department Provider Note  ____________________________________________   First MD Initiated Contact with Patient 04/22/20 1542     (approximate)  I have reviewed the triage vital signs and the nursing notes.   HISTORY  Chief Complaint Altered Mental Status   HPI Raymond [name] is a 59 y.o. male with a past medical history of epilepsy, HTN, OCD, severe mitral regurg, CHF, and developmental delay who presents to EMS from nursing facility after he was found on the floor or confused than usual.  Patient is unable to provide any history secondary to altered mental status.  I did attempt to obtain history from staff taking care of the patient but no one was available speak to me after I called 2 times.  Staff stated they would call back.         Past Medical History:  Diagnosis Date  . Epilepsy (HCC)   . Epileptic seizures (HCC)   . Hyperlipidemia   . Mental retardation   . OCD (obsessive compulsive disorder)    water drinking  . OCD (obsessive compulsive disorder)   . Severe mitral regurgitation    a. 02/2016 Echo: EF 55-60%, no rwma, MVP of posterior leaflet w/ an anteriorly directed MR - severe. Sev dil LA; b. TTE 8/18: EF of 55-60%, normal wall motion, normal LV diastolic function parameters, severe mitral prolapse involving the anterior leaflet and posterior leaflet with severe regurgitation directed anteriorly, severely dilated left atrium, unable to estimate the PASP  . Systolic murmur     Patient Active Problem List   Diagnosis Date Noted  . AMS (altered mental status) 04/22/2020  . Chronic systolic CHF (congestive heart failure) (HCC) 03/27/2020  . Protein-calorie malnutrition, severe 03/05/2020  . Acute kidney injury superimposed on CKD (HCC)   . Goals of care, counseling/discussion   . Palliative care by specialist   . DNR (do not resuscitate) discussion   . Acute on chronic diastolic (congestive) heart failure (HCC)  03/02/2020  . Hypotension   . Mitral valve insufficiency   . Acute on chronic diastolic CHF (congestive heart failure) (HCC) 03/01/2020  . HTN (hypertension) 03/01/2020  . GERD (gastroesophageal reflux disease) 03/01/2020  . New onset atrial fibrillation (HCC) 03/01/2020  . Atrial fibrillation with RVR (HCC) 03/01/2020  . Seizure (HCC)   . Nose fracture 04/09/2016  . Weakness 07/11/2015  . Epileptic seizures (HCC) 01/12/2015  . Intellectual disability   . OCD (obsessive compulsive disorder)   . Hypertriglyceridemia   . Epilepsy (HCC)   . Systolic murmur     Past Surgical History:  Procedure Laterality Date  . COLONOSCOPY WITH PROPOFOL N/A 03/23/2019   Procedure: COLONOSCOPY WITH PROPOFOL;  Surgeon: Toledo, Boykin Nearing, MD;  Location: [facility] ENDOSCOPY;  Service: Gastroenterology;  Laterality: N/A;  . ESOPHAGOGASTRODUODENOSCOPY (EGD) WITH PROPOFOL N/A 03/23/2019   Procedure: ESOPHAGOGASTRODUODENOSCOPY (EGD) WITH PROPOFOL;  Surgeon: Toledo, Boykin Nearing, MD;  Location: [facility] ENDOSCOPY;  Service: Gastroenterology;  Laterality: N/A;  NEEDS A RAPID    Prior to Admission medications   Medication Sig Start Date End Date Taking? Authorizing Provider  risperiDONE (RISPERDAL) 0.5 MG tablet Take 1 tablet (0.5 mg total) by mouth daily. 03/30/20  Yes Amin, Loura Halt, MD  risperiDONE (RISPERDAL) 1 MG tablet Take 1 tablet (1 mg total) by mouth at bedtime. 03/29/20  Yes Amin, Ankit Chirag, MD  albuterol (PROVENTIL HFA;VENTOLIN HFA) 108 (90 Base) MCG/ACT inhaler Inhale 2 puffs into the lungs every 6 (six) hours as needed for wheezing or  shortness of breath. 07/26/16   Particia Nearing, PA-C  amiodarone (PACERONE) 200 MG tablet Take 1 tablet (200 mg total) by mouth daily. Start taking this dosage on March 16, 2020 03/15/20 04/14/20  Charise Killian, MD  aspirin EC 81 MG tablet Take 81 mg by mouth daily.    [provider]  benztropine (COGENTIN) 0.5 MG tablet Take 0.5 mg by mouth 2 (two) times  daily. 03/14/20   [provider]  Cholecalciferol (VITAMIN D) 50 MCG (2000 UT) CAPS Take 2,000 Units by mouth daily.     [provider]  Cyanocobalamin (VITAMIN B 12 PO) Take 1,000 mcg by mouth daily.     [provider]  digoxin 62.5 MCG TABS Take 0.0625 mg by mouth daily. 03/11/20 04/10/20  Charise Killian, MD  divalproex (DEPAKOTE) 500 MG DR tablet TAKE 1 TABLET BY MOUTH THREE TIMES DAILY WITH MEALS. Patient taking differently: Take 500 mg by mouth with breakfast, with lunch, and with evening meal.  07/02/16   Steele Sizer, MD  Ergocalciferol (VITAMIN D2 PO) Take 1.25 mg by mouth once a week.    [provider]  famotidine (PEPCID) 40 MG tablet Take 40 mg by mouth at bedtime.    [provider]  fenofibrate (TRICOR) 145 MG tablet TAKE 1 TABLET BY MOUTH ONCE DAILY FOR LIPIDS. Patient taking differently: Take 145 mg by mouth daily.  01/02/17   Steele Sizer, MD  levETIRAcetam (KEPPRA) 1000 MG tablet Take 1 tablet (1,000 mg total) by mouth daily. 03/11/20 04/10/20  Charise Killian, MD  levETIRAcetam (KEPPRA) 750 MG tablet Take 2 tablets (1,500 mg total) by mouth every evening. 03/10/20 04/09/20  Charise Killian, MD  meclizine (ANTIVERT) 25 MG tablet Take 25 mg by mouth 3 (three) times daily as needed for dizziness.    [provider]  Multiple Vitamin (MULTIVITAMIN WITH MINERALS) TABS tablet Take 1 tablet by mouth daily.    [provider]  Nutritional Supplements (ENSURE PO) Take 1 Can by mouth daily.    [provider]  omeprazole (PRILOSEC) 40 MG capsule Take 40 mg by mouth daily.    [provider]  Wheat Dextrin (BENEFIBER PO) Take by mouth daily. 2 ? in 8 oz QD    [provider]    Allergies Patient has no known allergies.  Family History  Family history unknown: Yes    Social History Social History   Tobacco Use  . Smoking status: Never Smoker  . Smokeless tobacco: Never Used    Vaping Use  . Vaping Use: Never used  Substance Use Topics  . Alcohol use: No    Alcohol/week: 0.0 standard drinks  . Drug use: No    Review of Systems  Review of Systems  Unable to perform ROS: Mental status change      ____________________________________________   PHYSICAL EXAM:  VITAL SIGNS: ED Triage Vitals  Enc Vitals Group     BP      Pulse      Resp      Temp      Temp src      SpO2      Weight      Height      Head Circumference      Peak Flow      Pain Score      Pain Loc      Pain Edu?      Excl. in GC?  Vitals:   04/22/20 2030 04/22/20 2130  BP: (!) 86/63 100/64  Pulse: 97 94  Resp: (!) 23 17  Temp:    SpO2: 91% 98%   Physical Exam Vitals and nursing note reviewed.  HENT:     Head: Normocephalic and atraumatic.     Right Ear: External ear normal.     Left Ear: External ear normal.     Nose: Nose normal.  Cardiovascular:     Rate and Rhythm: Normal rate and regular rhythm.     Pulses: Normal pulses.     Heart sounds: No murmur heard.   Pulmonary:     Breath sounds: Rhonchi present.  Musculoskeletal:     Right lower leg: No edema.     Left lower leg: No edema.  Skin:    General: Skin is warm.     Coloration: Skin is not jaundiced.  Neurological:     Mental Status: He is lethargic.     GCS: GCS eye subscore is 4. GCS verbal subscore is 2. GCS motor subscore is 4.     Pupils are symmetric and reactive to light.  The left eye does not seem to adduct as well as the right pupil. ____________________________________________   LABS (all labs ordered are listed, but only abnormal results are displayed)  Labs Reviewed  CBC WITH DIFFERENTIAL/PLATELET - Abnormal; Notable for the following components:      Result Value   RBC 3.57 (*)    Hemoglobin 11.4 (*)    HCT 34.0 (*)    Platelets 123 (*)    All other components within normal limits  COMPREHENSIVE METABOLIC PANEL - Abnormal; Notable for the following components:   Sodium 132  (*)    Glucose, Bld 111 (*)    Calcium 8.7 (*)    Total Protein 6.1 (*)    Albumin 2.9 (*)    Alkaline Phosphatase 27 (*)    Total Bilirubin 1.3 (*)    All other components within normal limits  AMMONIA - Abnormal; Notable for the following components:   Ammonia 52 (*)    All other components within normal limits  URINALYSIS, COMPLETE (UACMP) WITH MICROSCOPIC - Abnormal; Notable for the following components:   Color, Urine AMBER (*)    APPearance HAZY (*)    Bilirubin Urine SMALL (*)    Protein, ur 30 (*)    Bacteria, UA RARE (*)    All other components within normal limits  URINE DRUG SCREEN, QUALITATIVE ([facility] ONLY) - Abnormal; Notable for the following components:   MDMA (Ecstasy)Ur Screen POSITIVE (*)    All other components within normal limits  BLOOD GAS, VENOUS - Abnormal; Notable for the following components:   pCO2, Ven 38 (*)    pO2, Ven 68.0 (*)    All other components within normal limits  DIGOXIN LEVEL - Abnormal; Notable for the following components:   Digoxin Level 0.2 (*)    All other components within normal limits  PROTIME-INR - Abnormal; Notable for the following components:   Prothrombin Time 15.4 (*)    INR 1.3 (*)    All other components within normal limits  RESPIRATORY PANEL BY RT PCR (FLU A&B, COVID)  MAGNESIUM  TSH  LACTIC ACID, PLASMA  VALPROIC ACID LEVEL  TSH  LEVETIRACETAM LEVEL  CBC  COMPREHENSIVE METABOLIC PANEL  AMMONIA  TROPONIN I (HIGH SENSITIVITY)   ____________________________________________  EKG  A. fib with a ventricular rate of 97, normal axis, unremarkable intervals, T wave inversions in  the lateral leads V5 and V6 as well as nonspecific ST changes in anterior leads and inferior leads.  These T wave inversions are more pronounced today but present on prior EKGs.   ____________________________________________  RADIOLOGY  ED MD interpretation: CT head shows no evidence of intracranial bleeding.  Arterial phase shows no  evidence of aneurysm but does show atherosclerotic disease.  Chest x-ray and pelvic x-ray shows no evidence of traumatic injuries.  No significant volume overload or effusion or focal consolidation on chest x-ray.  Official radiology report(s): CT Angio Head W or Wo Contrast  Result Date: 04/22/2020 CLINICAL DATA:  Neuro deficit EXAM: CT ANGIOGRAPHY HEAD TECHNIQUE: Multidetector CT imaging of the head was performed using the standard protocol during bolus administration of intravenous contrast. Multiplanar CT image reconstructions and MIPs were obtained to evaluate the vascular anatomy. CONTRAST:  58mL OMNIPAQUE IOHEXOL 350 MG/ML SOLN COMPARISON:  03/26/2020 MRI head and prior.  03/23/2020 head CT. FINDINGS: CT HEAD Brain: No acute infarct or intracranial hemorrhage. Moderate cerebral atrophy with ex vacuo dilatation. Scattered and confluent hypodense foci involving the periventricular and subcortical white matter nonspecific however commonly associated with chronic microvascular ischemic changes. No mass lesion. No midline shift, ventriculomegaly or extra-axial fluid collection. Vascular: No hyperdense vessel or unexpected calcification. Bilateral skull base atherosclerotic calcifications. Skull: Negative for fracture or focal lesion. Sinuses/Orbits: Normal orbits. Clear paranasal sinuses. No mastoid effusion. Other: None. CTA HEAD Anterior circulation: Left A1 segment hypoplasia. Bilateral anterior and middle cerebral arteries are otherwise unremarkable. Bilateral carotid siphon atherosclerotic calcifications with mild narrowing of the intracavernous ICAs. Patent and normal caliber bilateral P comms. No significant stenosis, proximal occlusion, aneurysm, or vascular malformation. Posterior circulation: Dominant left vertebral artery. Patent normal caliber basilar, superior cerebellar and posterior cerebral arteries. No significant stenosis, proximal occlusion, aneurysm, or vascular malformation. Venous  sinuses: As permitted by contrast timing, patent. Anatomic variants: None. IMPRESSION: CT HEAD: No acute intracranial process. Moderate cerebral atrophy and chronic microvascular ischemic changes. CTA HEAD: Mild bilateral intracavernous ICA narrowing secondary to atherosclerotic disease. No large vessel occlusion, high-grade narrowing, dissection or aneurysm. Electronically Signed   By: Stana Bunting M.D.   On: 04/22/2020 19:53   DG Chest 1 View  Result Date: 04/22/2020 CLINICAL DATA:  Altered mental status. EXAM: CHEST  1 VIEW COMPARISON:  03/23/2020 FINDINGS: The heart is enlarged but stable. Stable tortuosity and calcification of the thoracic aorta. No infiltrates, edema or effusions. No pulmonary lesions or pneumothorax. The bony thorax is intact. IMPRESSION: 1. Stable cardiac enlargement. 2. No acute pulmonary findings. Electronically Signed   By: Rudie Meyer M.D.   On: 04/22/2020 16:35   CT Cervical Spine Wo Contrast  Result Date: 04/22/2020 CLINICAL DATA:  Altered mental status EXAM: CT CERVICAL SPINE WITHOUT CONTRAST TECHNIQUE: Multidetector CT imaging of the cervical spine was performed without intravenous contrast. Multiplanar CT image reconstructions were also generated. COMPARISON:  None. FINDINGS: Alignment: Straightening of lordosis.  No listhesis. Skull base and vertebrae: No acute fracture. No primary bone lesion or focal pathologic process. Soft tissues and spinal canal: No prevertebral fluid or swelling. No visible canal hematoma. Disc levels: Multilevel endplate degenerative changes including bridging osteophytosis and disc space loss most prominent at the C6-7 level. Patent bony spinal canal. Moderate right C6-7 bony neural foraminal narrowing secondary to uncovertebral and facet hypertrophy. Upper chest: Clear lung apices. Other: None. IMPRESSION: No acute osseous abnormality or traumatic listhesis. Multilevel spondylosis. Moderate right C6-7 bony neural foraminal narrowing.  Electronically Signed   By:  Stana Bunting M.D.   On: 04/22/2020 20:45   DG Pelvis Portable  Result Date: 04/22/2020 CLINICAL DATA:  Pelvic pain. EXAM: PORTABLE PELVIS 1-2 VIEWS COMPARISON:  None. FINDINGS: Both hips are normally located. No definite hip fractures. The pubic symphysis and SI joints are intact. No definite pelvic fractures. IMPRESSION: No acute bony findings. Electronically Signed   By: Rudie Meyer M.D.   On: 04/22/2020 16:36    ____________________________________________   PROCEDURES  Procedure(s) performed (including Critical Care):  .1-3 Lead EKG Interpretation Performed by: Gilles Chiquito, MD Authorized by: Gilles Chiquito, MD     Interpretation: normal     ECG rate assessment: normal     Rhythm: atrial fibrillation     Ectopy: none     Conduction: normal       ____________________________________________   INITIAL IMPRESSION / ASSESSMENT AND PLAN / ED COURSE        Patient presents with [place] to history exam after being found on the floor with concerns for altered mental status.  Patient is slightly hypertensive with otherwise stable vital signs on room air on arrival.  Exam as above.  Differential includes CVA, sepsis, metabolic derangement, endocrine derangement, polypharmacy, traumatic intracranial bleeding, and postictal state from seizure.  Low suspicion for sepsis as patient does not have evidence of pneumonia or infected urine and has no fever or elevation of his white blood cell count.  There is no clearly focal findings on exam with the exception of L eye or findings on CT head to suggest CVA although if patient does not improve would likely be reasonable to obtain MRI.  No significant metabolic derangements on CMP which does show some evidence of poor nutrition with low protein and albumin.  Patient's ammonia is elevated although it is unclear if this is contributing to his current presentation it is noted to have been elevated on multiple  prior presentations..  TSH is WNL.  UDS is positive for MDMA although it is unclear if it is a false positive.  Patient's medication list does include Depakote, Keppra, and digoxin levels for these medications were sent as polypharmacy is of off on differential.  Patient was given small dose of IV fluids while in the ED for some soft blood pressures which are likely secondary to some dehydration.  Given persistent altered mental status apparently acute in nature from baseline we will plan to admit for encephalopathy of unclear etiology.  ____________________________________   FINAL CLINICAL IMPRESSION(S) / ED DIAGNOSES  Final diagnoses:  Altered mental status, unspecified altered mental status type  Increased ammonia level  Hypoalbuminemia  Dehydration    Medications  levETIRAcetam (KEPPRA) IVPB 1000 mg/100 mL premix (0 mg Intravenous Stopped 04/22/20 2150)  aspirin EC tablet 81 mg (has no administration in time range)  amiodarone (PACERONE) tablet 200 mg (has no administration in time range)  Digoxin TABS 0.0625 mg (has no administration in time range)  fenofibrate tablet 160 mg (has no administration in time range)  risperiDONE (RISPERDAL) tablet 0.5 mg (has no administration in time range)  risperiDONE (RISPERDAL) tablet 1 mg (has no administration in time range)  famotidine (PEPCID) tablet 40 mg (has no administration in time range)  meclizine (ANTIVERT) tablet 12.5 mg (has no administration in time range)  pantoprazole (PROTONIX) EC tablet 40 mg (has no administration in time range)  Benefiber POWD (has no administration in time range)  Vitamin B 12 TABS 1,000 mcg (has no administration in time range)  enoxaparin (LOVENOX) injection  40 mg (has no administration in time range)  0.9 %  sodium chloride infusion ( Intravenous New Bag/Given 04/22/20 2043)  acetaminophen (TYLENOL) tablet 650 mg (has no administration in time range)    Or  acetaminophen (TYLENOL) suppository 650 mg  (has no administration in time range)  traZODone (DESYREL) tablet 25 mg (has no administration in time range)  magnesium hydroxide (MILK OF MAGNESIA) suspension 30 mL (has no administration in time range)  ondansetron (ZOFRAN) tablet 4 mg (has no administration in time range)    Or  ondansetron (ZOFRAN) injection 4 mg (has no administration in time range)  lactulose (CHRONULAC) 10 GM/15ML solution 20 g (has no administration in time range)  sodium chloride 0.45 % bolus 250 mL (has no administration in time range)  lactated ringers bolus 1,000 mL (0 mLs Intravenous Stopped 04/22/20 2027)  iohexol (OMNIPAQUE) 350 MG/ML injection 75 mL (75 mLs Intravenous Contrast Given 04/22/20 1918)  magnesium sulfate IVPB 2 g 50 mL (0 g Intravenous Stopped 04/22/20 2150)    ED Discharge Orders    None       Note:  This document was prepared using Dragon voice recognition software and may include unintentional dictation errors.   Gilles ChiquitoSmith, Kinlie Janice P, MD 04/22/20 2158

## 2020-04-22 NOTE — ED Triage Notes (Signed)
Patient BIBA from [facility] with altered mental status. Per staff patient was acting normally at 1400 today. Per staff patient is A&OX3 at baseline. Patient responds with incomprehensible responses and is alert on arrival to ER. Patient vitals stable. Afebrile.

## 2020-04-22 NOTE — ED Notes (Signed)
Patient transported to CT 

## 2020-04-23 ENCOUNTER — Other Ambulatory Visit: Payer: Self-pay

## 2020-04-23 DIAGNOSIS — R410 Disorientation, unspecified: Secondary | ICD-10-CM | POA: Diagnosis not present

## 2020-04-23 DIAGNOSIS — R4182 Altered mental status, unspecified: Secondary | ICD-10-CM

## 2020-04-23 DIAGNOSIS — R4701 Aphasia: Secondary | ICD-10-CM | POA: Diagnosis present

## 2020-04-23 DIAGNOSIS — E871 Hypo-osmolality and hyponatremia: Secondary | ICD-10-CM | POA: Diagnosis present

## 2020-04-23 DIAGNOSIS — Z79899 Other long term (current) drug therapy: Secondary | ICD-10-CM | POA: Diagnosis not present

## 2020-04-23 DIAGNOSIS — Z7189 Other specified counseling: Secondary | ICD-10-CM | POA: Diagnosis not present

## 2020-04-23 DIAGNOSIS — Z515 Encounter for palliative care: Secondary | ICD-10-CM | POA: Diagnosis not present

## 2020-04-23 DIAGNOSIS — F79 Unspecified intellectual disabilities: Secondary | ICD-10-CM | POA: Diagnosis present

## 2020-04-23 DIAGNOSIS — E43 Unspecified severe protein-calorie malnutrition: Secondary | ICD-10-CM | POA: Diagnosis present

## 2020-04-23 DIAGNOSIS — G9340 Encephalopathy, unspecified: Secondary | ICD-10-CM | POA: Diagnosis not present

## 2020-04-23 DIAGNOSIS — I480 Paroxysmal atrial fibrillation: Secondary | ICD-10-CM | POA: Diagnosis present

## 2020-04-23 DIAGNOSIS — F429 Obsessive-compulsive disorder, unspecified: Secondary | ICD-10-CM | POA: Diagnosis present

## 2020-04-23 DIAGNOSIS — Z20822 Contact with and (suspected) exposure to covid-19: Secondary | ICD-10-CM | POA: Diagnosis present

## 2020-04-23 DIAGNOSIS — E860 Dehydration: Secondary | ICD-10-CM | POA: Diagnosis present

## 2020-04-23 DIAGNOSIS — E7220 Disorder of urea cycle metabolism, unspecified: Secondary | ICD-10-CM | POA: Diagnosis present

## 2020-04-23 DIAGNOSIS — Z66 Do not resuscitate: Secondary | ICD-10-CM | POA: Diagnosis present

## 2020-04-23 DIAGNOSIS — I959 Hypotension, unspecified: Secondary | ICD-10-CM | POA: Diagnosis present

## 2020-04-23 DIAGNOSIS — I110 Hypertensive heart disease with heart failure: Secondary | ICD-10-CM | POA: Diagnosis present

## 2020-04-23 DIAGNOSIS — R1312 Dysphagia, oropharyngeal phase: Secondary | ICD-10-CM | POA: Diagnosis present

## 2020-04-23 DIAGNOSIS — D696 Thrombocytopenia, unspecified: Secondary | ICD-10-CM | POA: Diagnosis present

## 2020-04-23 DIAGNOSIS — K219 Gastro-esophageal reflux disease without esophagitis: Secondary | ICD-10-CM | POA: Diagnosis present

## 2020-04-23 DIAGNOSIS — G40909 Epilepsy, unspecified, not intractable, without status epilepticus: Secondary | ICD-10-CM | POA: Diagnosis present

## 2020-04-23 DIAGNOSIS — T426X5A Adverse effect of other antiepileptic and sedative-hypnotic drugs, initial encounter: Secondary | ICD-10-CM | POA: Diagnosis present

## 2020-04-23 DIAGNOSIS — I340 Nonrheumatic mitral (valve) insufficiency: Secondary | ICD-10-CM | POA: Diagnosis present

## 2020-04-23 DIAGNOSIS — Z681 Body mass index (BMI) 19 or less, adult: Secondary | ICD-10-CM | POA: Diagnosis not present

## 2020-04-23 DIAGNOSIS — R946 Abnormal results of thyroid function studies: Secondary | ICD-10-CM | POA: Diagnosis present

## 2020-04-23 DIAGNOSIS — I5022 Chronic systolic (congestive) heart failure: Secondary | ICD-10-CM | POA: Diagnosis present

## 2020-04-23 DIAGNOSIS — G9341 Metabolic encephalopathy: Secondary | ICD-10-CM | POA: Diagnosis present

## 2020-04-23 DIAGNOSIS — E785 Hyperlipidemia, unspecified: Secondary | ICD-10-CM | POA: Diagnosis present

## 2020-04-23 LAB — COMPREHENSIVE METABOLIC PANEL
ALT: 12 U/L (ref 0–44)
AST: 29 U/L (ref 15–41)
Albumin: 2.7 g/dL — ABNORMAL LOW (ref 3.5–5.0)
Alkaline Phosphatase: 29 U/L — ABNORMAL LOW (ref 38–126)
Anion gap: 7 (ref 5–15)
BUN: 10 mg/dL (ref 6–20)
CO2: 23 mmol/L (ref 22–32)
Calcium: 8.2 mg/dL — ABNORMAL LOW (ref 8.9–10.3)
Chloride: 104 mmol/L (ref 98–111)
Creatinine, Ser: 0.78 mg/dL (ref 0.61–1.24)
GFR calc Af Amer: >60 mL/min (ref >60)
GFR calc non Af Amer: >60 mL/min (ref >60)
Glucose, Bld: 88 mg/dL (ref 70–99)
Potassium: 3.9 mmol/L (ref 3.5–5.1)
Sodium: 134 mmol/L — ABNORMAL LOW (ref 135–145)
Total Bilirubin: 1.2 mg/dL (ref 0.3–1.2)
Total Protein: 5.6 g/dL — ABNORMAL LOW (ref 6.5–8.1)

## 2020-04-23 LAB — CBC
HCT: 32.9 % — ABNORMAL LOW (ref 39.0–52.0)
Hemoglobin: 10.8 g/dL — ABNORMAL LOW (ref 13.0–17.0)
MCH: 31.9 pg (ref 26.0–34.0)
MCHC: 32.8 g/dL (ref 30.0–36.0)
MCV: 97.1 fL (ref 80.0–100.0)
Platelets: 110 K/uL — ABNORMAL LOW (ref 150–400)
RBC: 3.39 MIL/uL — ABNORMAL LOW (ref 4.22–5.81)
RDW: 14.6 % (ref 11.5–15.5)
WBC: 8.6 K/uL (ref 4.0–10.5)
nRBC: 0 % (ref 0.0–0.2)

## 2020-04-23 LAB — AMMONIA: Ammonia: 39 umol/L — ABNORMAL HIGH (ref 9–35)

## 2020-04-23 MED ORDER — PSYLLIUM 95 % PO PACK
1.0000 | PACK | Freq: Every day | ORAL | Status: DC
  Administered 2020-04-23 – 2020-04-26 (×2): 1 via ORAL
  Filled 2020-04-23 (×7): qty 1

## 2020-04-23 MED ORDER — SODIUM CHLORIDE 0.9 % IV SOLN: INTRAVENOUS | Status: AC

## 2020-04-23 MED ORDER — CARBAMAZEPINE 200 MG PO TABS: 200.0000 mg | ORAL_TABLET | Freq: Three times a day (TID) | ORAL | Status: DC

## 2020-04-23 MED ORDER — ALBUMIN HUMAN 25 % IV SOLN
25.0000 g | Freq: Once | INTRAVENOUS | Status: AC
  Administered 2020-04-23: 25 g via INTRAVENOUS
  Filled 2020-04-23: qty 100

## 2020-04-23 MED ORDER — DIVALPROEX SODIUM 500 MG PO DR TAB
500.0000 mg | DELAYED_RELEASE_TABLET | Freq: Three times a day (TID) | ORAL | Status: DC
  Administered 2020-04-24 – 2020-04-26 (×5): 500 mg via ORAL
  Filled 2020-04-23 (×10): qty 1

## 2020-04-23 MED ORDER — LEVETIRACETAM 500 MG PO TABS
1000.0000 mg | ORAL_TABLET | Freq: Every day | ORAL | Status: DC
  Administered 2020-04-24 – 2020-04-26 (×3): 1000 mg via ORAL
  Filled 2020-04-23 (×3): qty 2

## 2020-04-23 MED ORDER — LEVETIRACETAM 500 MG PO TABS: 1000.0000 mg | ORAL_TABLET | Freq: Every day | ORAL | Status: DC

## 2020-04-23 MED ORDER — BENZTROPINE MESYLATE 0.5 MG PO TABS
0.5000 mg | ORAL_TABLET | Freq: Two times a day (BID) | ORAL | Status: DC
  Administered 2020-04-23 – 2020-05-03 (×10): 0.5 mg via ORAL
  Filled 2020-04-23 (×22): qty 1

## 2020-04-23 NOTE — ED Notes (Signed)
Patient ate breakfast. Patient denies any concerns when asked yes or no questions.

## 2020-04-23 NOTE — ED Notes (Signed)
Patient's linens were changed. Patient was incontinent of urine and stool. Patient ate 1/2 of grilled chicken and all of his mashed potatoes and drank his tea. Pharmacy called about missing medications.

## 2020-04-23 NOTE — ED Notes (Signed)
Room darkened for patient's comfort. Patient repositioned on stretcher, but prefers to stay on right side with head against railing. Pillow placed for comfort.

## 2020-04-23 NOTE — Progress Notes (Signed)
PROGRESS NOTE    Raymond [name]  ZOX:[id number] DOB: March 12, 1961 DOA: 04/22/2020 PCP: [name]   Brief Narrative:  HPI per Dr. Valente David on 04/22/20 Raymond [name]  is a 59 y.o. Caucasian male coming from [facility] with a known history of seizure disorder, mental retardation, obsessive-compulsive disorder, dyslipidemia and severe mitral regurgitation and CHF, who presented to the emergency room with acute onset of altered mental status.  He was found on the floor at his SNF.  No reported dysuria, oliguria or hematuria or flank pain.  No reported cough or wheezing or dyspnea.  No reported chest pain or palpitations.  The patient is a very poor historian due to his altered mental status as well as underlying developmental delay.  Upon presentation to emergency room, blood pressure was 88/61 with otherwise normal vital signs.  It has come up to 102/75 with hydration and later on was down to 88/62.  Labs revealed mild hyponatremia 132 and albumin of 2.9 with total protein of 6.1, total bili of 1.3.  Ammonia level was elevated at 52.  High-sensitivity troponin was 10 and lactic acid 1.  CBC showed mild anemia and TSH came back 1.8.  Respiratory panel including COVID-19 PCR came back negative.  Urinalysis was unremarkable.  Urine drug screen was positive for MDMA.  He had head and neck CT scan revealing mild bilateral intracavernous ICA narrowing secondary to atherosclerotic disease with no large vessel occlusion or high-grade narrowing or dissection or aneurysm.  KG showed atrial fibrillation with controlled ventricular response of 97 with borderline intraventricular conduction delay and T wave inversion laterally.  The patient was given 1 L bolus of IV lactated Ringer and 1 g of IV Keppra.  He will be admitted to an observation medically monitored bed for further evaluation and management.  **Interim History Patient remained tachycardic today.  He did not answer questioning  given his current condition.  Unable to get in touch with his legal guardian.  Ammonia level is trending down we will continue IV fluid hydration but reduce the rate given his history of CHF  Assessment & Plan:   Active Problems:   AMS (altered mental status)   Encephalopathy acute  Acute Metabolic Encephalopathy likely hepatic encephalopathy given elevated ammonia level.  -This is likely worsened by volume depletion and dehydration. -CTA Head and Cervical Spine showed "No acute intracranial process. Moderate cerebral atrophy and chronic microvascular ischemic changes. Mild bilateral intracavernous ICA narrowing secondary to atherosclerotic disease. No large vessel occlusion, high-grade narrowing, dissection or Aneurysm." -The patient will be admitted to an observation medical monitored bed. -He will be placed on p.o. lactulose. -Ammonia level went from 52 and is now 18 -We will follow neuro checks every 4 hours for 24 hours. -I called to speak with his legal guardian from the state and they do not answer -We will follow daily ammonia level. -Place on delirium precautions -May need an MRI and neuro consultation -Continue monitor and trend carefully  Hypotension. -This could be likely related to volume depletion and dehydration. -He will be aggressively hydrated with IV normal saline. -See below -During given a dose of IV albumin 25 g -Continue with normal saline at 100 MLS per hour changed to 75 mils per hour for 30 more hours and reevaluate the need for fluids in the morning given his history of systolic CHF -Last blood pressure reading was 100/72  Hyponatremia with borderline magnesium level. -This is mild and is likely related to volume depletion. -As  mentioned above he will be hydrated with IV normal saline and will follow his BMP. -We will optimize his magnesium. -Na+ is slowly improving and went from 132 -> 134 -Continue to monitor and trend repeat CMP in a.m.  Seizure  Disorder. -When checking Keppra and Depakote levels.  -He was given IV Keppra in the ER and this will be continued at 1000 mg every 12h for 3 doses and change back to his home dose in the a.m.; he takes levetiracetam 1000 g p.o. daily along with Tegretol 200 g p.o. 3 times daily-valproic acid 500 mg 3 times daily but will need to verify his medications as pharmacy  -We will resume his carbamazepine 200 mg p.o. 3 times daily as well as his divalproex 500 mg p.o. 3 times daily as above -Valproic acid level was 77 -He will will be placed on seizure precautions. -We we will need an accurate medication reconciliation  History of Hypertension. -His Coreg and Cozaar will be held off given his current hypotension. -Continue to monitor blood pressures per protocol -Last blood pressure reading was 100/72  Paroxysmal atrial fibrillation.   -He has been occasionally with rapid ventricular response  -We will continue Amiodarone 200 mg po Daily, Digoxin 0.0625 mg; Digoxin Level was 0.2 and low -C/w ASA 81 mg po Daily -PT/INR was 15.4/1.3 Respectively  -Will resume his Carvedilol 3.125 mg p.o. twice daily when his BP is a little better -He is apparently a fall risk and therefore is not on anticoagulation. -We will optimize his magnesium level as mentioned above. -C/w Telemetry Monitorying  Dyslipidemia. -We will continue his Fenofibrate 160 mg po Daily  Intellectual Disability/Mental Retardation with behavioral changes. -We will continue as needed Risperdal 0.5 mg po Daily and 1 mg qHS  GERD. -PPI therapy with Pantoprazole 40 mg po Daily resumed along with Famotidine 40 mg po qHS  Normocytic Anemia -Patient's hemoglobin/hematocrit went from 11.4/34.0 and is now 10.8/32.9 -check anemia panel in a.m. -Continue to monitor for signs and symptoms of bleeding; currently no overt bleeding noted -Repeat CBC in a.m.  Thrombocytopenia -Mild with a platelet count of 123 and trended down to  110 -Continue to monitor for signs and symptoms of bleeding; currently no overt bleeding noted  -repeat CBC in AM  Severe Mitral Regurgitation -Recent echo done showed moderate to severe mitral valve regurgitation  Chronic Systolic CHF -Currently appears slightly dry so is getting normal saline 100 MLS per hour and will reduce to 75 MLS per minute 13/10-continue to monitor for signs and symptoms of volume overload -Recent echocardiogram done showed an EF of 40 to 45% with normal left ventricular diastolic parameters -Repeat Chest x-ray in the a.m.  GOC: DNR, poA  DVT prophylaxis: Enoxaparin 40 mg sq q24h Code Status: DO NOT RESUSCITATE Family Communication: No family present at bedside and tried calling Legal Guardian Emergency Contact [name] and unavailable to speak.  Disposition Plan: Pending further improvement back to his baseline  Status is: Inpatient  Remains inpatient appropriate because:Unsafe d/c plan, IV treatments appropriate due to intensity of illness or inability to take PO and Inpatient level of care appropriate due to severity of illness   Dispo: The patient is from: SNF              Anticipated d/c is to: SNF              Anticipated d/c date is: 2 days  Patient currently is not medically stable to d/c.  Consultants:   None   Procedures: None  Antimicrobials:  Anti-infectives (From admission, onward)   None        Subjective: And examined at bedside and he was just staring.  Unable to provide a subjective history.  Unable to contact his legal guardian either.  Heart rate remains elevated.  Unable to provide a subjective history but did not appear to be in any acute distress.  Objective: Vitals:   04/23/20 0820 04/23/20 0839 04/23/20 1645 04/23/20 1700  BP: 101/77 99/73 100/72   Pulse: (!) 106 99  96  Resp: 20 (!) 22 (!) 22   Temp: 98.6 F (37 C) 98 F (36.7 C)    TempSrc: Oral Oral    SpO2: 96% 100% 100%   Weight:       Height:        Intake/Output Summary (Last 24 hours) at 04/23/2020 1753 Last data filed at 04/23/2020 3614 Gross per 24 hour  Intake 350 ml  Output --  Net 350 ml   Filed Weights   04/22/20 1549  Weight: 58 kg   Examination: Physical Exam:  Constitutional: Thin chronically ill-appearing Caucasian male who is unable to provide a subjective history secondary to his mental status. Eyes: Lids and conjunctivae normal, sclerae anicteric  ENMT: External Ears, Nose appear normal.  Neck: Appears normal, supple, no cervical masses, normal ROM, no appreciable thyromegaly Respiratory: Diminished to auscultation bilaterally, no wheezing, rales, rhonchi or crackles. Normal respiratory effort and patient is not tachypenic. No accessory muscle use.  Unlabored breathing Cardiovascular: Irregularly irregular and tachycardic.  Abdomen: Soft, non-tender, non-distended. Bowel sounds positive.  GU: Deferred. Musculoskeletal: No clubbing / cyanosis of digits/nails. No joint deformity upper and lower extremities.  Skin: No rashes, lesions, ulcers. No induration; Warm and dry.  Neurologic: Does not follow commands.   Psychiatric: Impaired judgment and insight.  Is not alert and oriented x 3. Normal mood and appropriate affect.   Data Reviewed: I have personally reviewed following labs and imaging studies  CBC: Recent Labs  Lab 04/22/20 1605 04/23/20 0432  WBC 8.5 8.6  NEUTROABS 5.6  --   HGB 11.4* 10.8*  HCT 34.0* 32.9*  MCV 95.2 97.1  PLT 123* 110*   Basic Metabolic Panel: Recent Labs  Lab 04/22/20 1605 04/23/20 0432  NA 132* 134*  K 4.0 3.9  CL 98 104  CO2 22 23  GLUCOSE 111* 88  BUN 14 10  CREATININE 0.94 0.78  CALCIUM 8.7* 8.2*  MG 1.7  --    GFR: Estimated Creatinine Clearance: 83.6 mL/min (by C-G formula based on SCr of 0.78 mg/dL). Liver Function Tests: Recent Labs  Lab 04/22/20 1605 04/23/20 0432  AST 33 29  ALT 16 12  ALKPHOS 27* 29*  BILITOT 1.3* 1.2  PROT 6.1*  5.6*  ALBUMIN 2.9* 2.7*   No results for input(s): LIPASE, AMYLASE in the last 168 hours. Recent Labs  Lab 04/22/20 1604 04/23/20 0432  AMMONIA 52* 39*   Coagulation Profile: Recent Labs  Lab 04/22/20 2034  INR 1.3*   Cardiac Enzymes: No results for input(s): CKTOTAL, CKMB, CKMBINDEX, TROPONINI in the last 168 hours. BNP (last 3 results) No results for input(s): PROBNP in the last 8760 hours. HbA1C: No results for input(s): HGBA1C in the last 72 hours. CBG: No results for input(s): GLUCAP in the last 168 hours. Lipid Profile: No results for input(s): CHOL, HDL, LDLCALC, TRIG, CHOLHDL, LDLDIRECT in the last  72 hours. Thyroid Function Tests: Recent Labs    04/22/20 2034  TSH 1.923   Anemia Panel: No results for input(s): VITAMINB12, FOLATE, FERRITIN, TIBC, IRON, RETICCTPCT in the last 72 hours. Sepsis Labs: Recent Labs  Lab 04/22/20 1604  LATICACIDVEN 1.0    Recent Results (from the past 240 hour(s))  Respiratory Panel by RT PCR (Flu A&B, Covid) - Nasopharyngeal Swab     Status: None   Collection Time: 04/22/20  4:04 PM   Specimen: Nasopharyngeal Swab  Result Value Ref Range Status   SARS Coronavirus 2 by RT PCR NEGATIVE NEGATIVE Final    Comment: (NOTE) SARS-CoV-2 target nucleic acids are NOT DETECTED.  The SARS-CoV-2 RNA is generally detectable in upper respiratoy specimens during the acute phase of infection. The lowest concentration of SARS-CoV-2 viral copies this assay can detect is 131 copies/mL. A negative result does not preclude SARS-Cov-2 infection and should not be used as the sole basis for treatment or other patient management decisions. A negative result may occur with  improper specimen collection/handling, submission of specimen other than nasopharyngeal swab, presence of viral mutation(s) within the areas targeted by this assay, and inadequate number of viral copies (<131 copies/mL). A negative result must be combined with  clinical observations, patient history, and epidemiological information. The expected result is Negative.  Fact Sheet for Patients:  https://www.moore.com/https://www.fda.gov/media/142436/download  Fact Sheet for Healthcare Providers:  https://www.young.biz/https://www.fda.gov/media/142435/download  This test is no t yet approved or cleared by the [place] FDA and  has been authorized for detection and/or diagnosis of SARS-CoV-2 by FDA under an Emergency Use Authorization (EUA). This EUA will remain  in effect (meaning this test can be used) for the duration of the COVID-19 declaration under Section 564(b)(1) of the Act, 21 U.S.C. section 360bbb-3(b)(1), unless the authorization is terminated or revoked sooner.     Influenza A by PCR NEGATIVE NEGATIVE Final   Influenza B by PCR NEGATIVE NEGATIVE Final    Comment: (NOTE) The Xpert Xpress SARS-CoV-2/FLU/RSV assay is intended as an aid in  the diagnosis of influenza from Nasopharyngeal swab specimens and  should not be used as a sole basis for treatment. Nasal washings and  aspirates are unacceptable for Xpert Xpress SARS-CoV-2/FLU/RSV  testing.  Fact Sheet for Patients: https://www.moore.com/https://www.fda.gov/media/142436/download  Fact Sheet for Healthcare Providers: https://www.young.biz/https://www.fda.gov/media/142435/download  This test is not yet approved or cleared by the [place] FDA and  has been authorized for detection and/or diagnosis of SARS-CoV-2 by  FDA under an Emergency Use Authorization (EUA). This EUA will remain  in effect (meaning this test can be used) for the duration of the  Covid-19 declaration under Section 564(b)(1) of the Act, 21  U.S.C. section 360bbb-3(b)(1), unless the authorization is  terminated or revoked. Performed at [facility], [address] [place], [place] [address]      RN Pressure Injury Documentation:     Estimated body mass index is 17.34 kg/m as calculated from the following:   Height as of this encounter: 6' (1.829 m).   Weight as of  this encounter: 58 kg.  Malnutrition Type:      Malnutrition Characteristics:      Nutrition Interventions:      Radiology Studies: CT Angio Head W or Wo Contrast  Result Date: 04/22/2020 CLINICAL DATA:  Neuro deficit EXAM: CT ANGIOGRAPHY HEAD TECHNIQUE: Multidetector CT imaging of the head was performed using the standard protocol during bolus administration of intravenous contrast. Multiplanar CT image reconstructions and MIPs were obtained to evaluate  the vascular anatomy. CONTRAST:  4mL OMNIPAQUE IOHEXOL 350 MG/ML SOLN COMPARISON:  03/26/2020 MRI head and prior.  03/23/2020 head CT. FINDINGS: CT HEAD Brain: No acute infarct or intracranial hemorrhage. Moderate cerebral atrophy with ex vacuo dilatation. Scattered and confluent hypodense foci involving the periventricular and subcortical white matter nonspecific however commonly associated with chronic microvascular ischemic changes. No mass lesion. No midline shift, ventriculomegaly or extra-axial fluid collection. Vascular: No hyperdense vessel or unexpected calcification. Bilateral skull base atherosclerotic calcifications. Skull: Negative for fracture or focal lesion. Sinuses/Orbits: Normal orbits. Clear paranasal sinuses. No mastoid effusion. Other: None. CTA HEAD Anterior circulation: Left A1 segment hypoplasia. Bilateral anterior and middle cerebral arteries are otherwise unremarkable. Bilateral carotid siphon atherosclerotic calcifications with mild narrowing of the intracavernous ICAs. Patent and normal caliber bilateral P comms. No significant stenosis, proximal occlusion, aneurysm, or vascular malformation. Posterior circulation: Dominant left vertebral artery. Patent normal caliber basilar, superior cerebellar and posterior cerebral arteries. No significant stenosis, proximal occlusion, aneurysm, or vascular malformation. Venous sinuses: As permitted by contrast timing, patent. Anatomic variants: None. IMPRESSION: CT HEAD: No acute  intracranial process. Moderate cerebral atrophy and chronic microvascular ischemic changes. CTA HEAD: Mild bilateral intracavernous ICA narrowing secondary to atherosclerotic disease. No large vessel occlusion, high-grade narrowing, dissection or aneurysm. Electronically Signed   By: Stana Bunting M.D.   On: 04/22/2020 19:53   DG Chest 1 View  Result Date: 04/22/2020 CLINICAL DATA:  Altered mental status. EXAM: CHEST  1 VIEW COMPARISON:  03/23/2020 FINDINGS: The heart is enlarged but stable. Stable tortuosity and calcification of the thoracic aorta. No infiltrates, edema or effusions. No pulmonary lesions or pneumothorax. The bony thorax is intact. IMPRESSION: 1. Stable cardiac enlargement. 2. No acute pulmonary findings. Electronically Signed   By: Rudie Meyer M.D.   On: 04/22/2020 16:35   CT Cervical Spine Wo Contrast  Result Date: 04/22/2020 CLINICAL DATA:  Altered mental status EXAM: CT CERVICAL SPINE WITHOUT CONTRAST TECHNIQUE: Multidetector CT imaging of the cervical spine was performed without intravenous contrast. Multiplanar CT image reconstructions were also generated. COMPARISON:  None. FINDINGS: Alignment: Straightening of lordosis.  No listhesis. Skull base and vertebrae: No acute fracture. No primary bone lesion or focal pathologic process. Soft tissues and spinal canal: No prevertebral fluid or swelling. No visible canal hematoma. Disc levels: Multilevel endplate degenerative changes including bridging osteophytosis and disc space loss most prominent at the C6-7 level. Patent bony spinal canal. Moderate right C6-7 bony neural foraminal narrowing secondary to uncovertebral and facet hypertrophy. Upper chest: Clear lung apices. Other: None. IMPRESSION: No acute osseous abnormality or traumatic listhesis. Multilevel spondylosis. Moderate right C6-7 bony neural foraminal narrowing. Electronically Signed   By: Stana Bunting M.D.   On: 04/22/2020 20:45   DG Pelvis Portable  Result  Date: 04/22/2020 CLINICAL DATA:  Pelvic pain. EXAM: PORTABLE PELVIS 1-2 VIEWS COMPARISON:  None. FINDINGS: Both hips are normally located. No definite hip fractures. The pubic symphysis and SI joints are intact. No definite pelvic fractures. IMPRESSION: No acute bony findings. Electronically Signed   By: Rudie Meyer M.D.   On: 04/22/2020 16:36   Scheduled Meds: . amiodarone  200 mg Oral Daily  . aspirin EC  81 mg Oral Daily  . digoxin  0.0625 mg Oral Daily  . enoxaparin (LOVENOX) injection  40 mg Subcutaneous Q24H  . famotidine  40 mg Oral QHS  . fenofibrate  160 mg Oral Daily  . lactulose  20 g Oral TID  . pantoprazole  40 mg Oral  Daily  . psyllium  1 packet Oral Daily  . risperiDONE  0.5 mg Oral Daily  . risperiDONE  1 mg Oral QHS  . vitamin B-12  1,000 mcg Oral Daily   Continuous Infusions: . sodium chloride 100 mL/hr at 04/23/20 0824  . levETIRAcetam Stopped (04/23/20 0921)    LOS: 0 days   Merlene Laughter, DO [facility] PAGER is on AMION  If 7PM-7AM, please contact night-coverage [email or web address]

## 2020-04-23 NOTE — ED Notes (Signed)
Patient given meal tray.

## 2020-04-23 NOTE — ED Notes (Signed)
Patient remains alert, able to answer simple questions with yes or no.

## 2020-04-23 NOTE — ED Notes (Signed)
Patient was repositioned on stretcher for meal.  Patient ate approximately 1/2 of spaghetti. Declined the rest of the tray. Patient drank his Metamucil with his meal.

## 2020-04-23 NOTE — ED Notes (Signed)
Report given to Michele RN. 

## 2020-04-23 NOTE — ED Notes (Signed)
Patient coughed for several minutes after taking oral meds. Patient was able to drink water without coughing afterwards. Oncoming RN informed.

## 2020-04-23 NOTE — ED Notes (Signed)
Pt alert, resting calmly in bed.

## 2020-04-23 NOTE — ED Notes (Signed)
Pt tolerating albumin well. VSS

## 2020-04-23 NOTE — Progress Notes (Signed)
Physical Therapy Evaluation Patient Details Name: Raymond [name] MRN: [id number] DOB: [birth year] Today's Date: 04/23/2020   History of Present Illness  Raymond [name]  is a 60 y.o. Caucasian male coming from [facility] with a known history of seizure disorder, mental retardation, obsessive-compulsive disorder, dyslipidemia and severe mitral regurgitation and CHF, who presented to the emergency room with acute onset of altered mental status.  He was found on the floor at his SNF.  No reported dysuria, oliguria or hematuria or flank pain.  No reported cough or wheezing or dyspnea.  No reported chest pain or palpitations.  The patient is a very poor historian due to his altered mental status as well as underlying developmental delay.  Clinical Impression  Patient agrees to PT eval. He has 3/5 BLE hip flex and knee extension strength. He answers 25-50% of questions and is lethargic . He is not able to sit at edge of bed due to increased RR 30, coughing and increased HR 130 BPM. Patient will continue to benefit from skilled PT to improve mobility and strength.    Follow Up Recommendations SNF    Equipment Recommendations  None recommended by PT    Recommendations for Other Services       Precautions / Restrictions Precautions Precautions: None Restrictions Weight Bearing Restrictions: No      Mobility  Bed Mobility Overal bed mobility: Needs Assistance Bed Mobility: Supine to Sit;Sit to Supine     Supine to sit: Max assist Sit to supine: Max assist   General bed mobility comments: not able to sit due to increased RR 30  Transfers                 General transfer comment:  (deferred)  Ambulation/Gait                Stairs            Wheelchair Mobility    Modified Rankin (Stroke Patients Only)       Balance                                             Pertinent Vitals/Pain Pain Assessment: No/denies pain     Home Living Family/patient expects to be discharged to:: Unsure                      Prior Function Level of Independence:  (unsure)               Hand Dominance        Extremity/Trunk Assessment   Upper Extremity Assessment Upper Extremity Assessment: Generalized weakness    Lower Extremity Assessment Lower Extremity Assessment: Generalized weakness;RLE deficits/detail;LLE deficits/detail RLE Deficits / Details: 3/5 hip, 3/5 knee ext LLE Deficits / Details: 3/5 hip, 3/5 knee ext       Communication   Communication: Other (comment) (slow to answer and answeres 50% of the time)  Cognition Arousal/Alertness: Lethargic Behavior During Therapy: Flat affect Overall Cognitive Status: History of cognitive impairments - at baseline                                        General Comments      Exercises     Assessment/Plan    PT Assessment Patient  needs continued PT services  PT Problem List Decreased strength;Decreased activity tolerance;Decreased mobility       PT Treatment Interventions Gait training;Therapeutic activities;Therapeutic exercise;Balance training    PT Goals (Current goals can be found in the Care Plan section)  Acute Rehab PT Goals Patient Stated Goal:  (no goals stated) PT Goal Formulation: Patient unable to participate in goal setting Time For Goal Achievement: 05/07/20 Potential to Achieve Goals: Fair    Frequency Min 2X/week   Barriers to discharge        Co-evaluation               AM-PAC PT "6 Clicks" Mobility  Outcome Measure Help needed turning from your back to your side while in a flat bed without using bedrails?: A Lot Help needed moving from lying on your back to sitting on the side of a flat bed without using bedrails?: A Lot Help needed moving to and from a bed to a chair (including a wheelchair)?: Total Help needed standing up from a chair using your arms (e.g., wheelchair or bedside  chair)?: Total Help needed to walk in hospital room?: Total Help needed climbing 3-5 steps with a railing? : Total 6 Click Score: 8    End of Session Equipment Utilized During Treatment: Gait belt;Oxygen Activity Tolerance: Patient limited by lethargy Patient left: in bed Nurse Communication: Mobility status PT Visit Diagnosis: Muscle weakness (generalized) (M62.81);Difficulty in walking, not elsewhere classified (R26.2)    Time: 6269-4854 PT Time Calculation (min) (ACUTE ONLY): 15 min   Charges:   PT Evaluation $PT Eval Low Complexity: 1 Low PT Treatments $Therapeutic Activity: 8-22 mins          Ezekiel Ina, PT DPT 04/23/2020, 1:03 PM

## 2020-04-23 NOTE — ED Notes (Signed)
Changed patient soiled brief and pad. Pt very scared of falling hard to turn to side.AS

## 2020-04-24 DIAGNOSIS — R4182 Altered mental status, unspecified: Secondary | ICD-10-CM | POA: Diagnosis not present

## 2020-04-24 DIAGNOSIS — G9340 Encephalopathy, unspecified: Secondary | ICD-10-CM | POA: Diagnosis not present

## 2020-04-24 LAB — RPR: RPR Ser Ql: NONREACTIVE

## 2020-04-24 LAB — COMPREHENSIVE METABOLIC PANEL
ALT: 15 U/L (ref 0–44)
AST: 28 U/L (ref 15–41)
Albumin: 2.7 g/dL — ABNORMAL LOW (ref 3.5–5.0)
Alkaline Phosphatase: 29 U/L — ABNORMAL LOW (ref 38–126)
Anion gap: 8 (ref 5–15)
BUN: 8 mg/dL (ref 6–20)
CO2: 23 mmol/L (ref 22–32)
Calcium: 8.6 mg/dL — ABNORMAL LOW (ref 8.9–10.3)
Chloride: 106 mmol/L (ref 98–111)
Creatinine, Ser: 0.65 mg/dL (ref 0.61–1.24)
GFR calc Af Amer: >60 mL/min (ref >60)
GFR calc non Af Amer: >60 mL/min (ref >60)
Glucose, Bld: 110 mg/dL — ABNORMAL HIGH (ref 70–99)
Potassium: 3.7 mmol/L (ref 3.5–5.1)
Sodium: 137 mmol/L (ref 135–145)
Total Bilirubin: 1 mg/dL (ref 0.3–1.2)
Total Protein: 5.8 g/dL — ABNORMAL LOW (ref 6.5–8.1)

## 2020-04-24 LAB — CBC WITH DIFFERENTIAL/PLATELET
Abs Immature Granulocytes: 0.02 10*3/uL (ref 0.00–0.07)
Basophils Absolute: 0 10*3/uL (ref 0.0–0.1)
Basophils Relative: 1 %
Eosinophils Absolute: 0.1 10*3/uL (ref 0.0–0.5)
Eosinophils Relative: 2 %
HCT: 30.4 % — ABNORMAL LOW (ref 39.0–52.0)
Hemoglobin: 10.5 g/dL — ABNORMAL LOW (ref 13.0–17.0)
Immature Granulocytes: 0 %
Lymphocytes Relative: 34 %
Lymphs Abs: 1.9 10*3/uL (ref 0.7–4.0)
MCH: 32.4 pg (ref 26.0–34.0)
MCHC: 34.5 g/dL (ref 30.0–36.0)
MCV: 93.8 fL (ref 80.0–100.0)
Monocytes Absolute: 0.5 10*3/uL (ref 0.1–1.0)
Monocytes Relative: 9 %
Neutro Abs: 3.1 10*3/uL (ref 1.7–7.7)
Neutrophils Relative %: 54 %
Platelets: 109 10*3/uL — ABNORMAL LOW (ref 150–400)
RBC: 3.24 MIL/uL — ABNORMAL LOW (ref 4.22–5.81)
RDW: 14.7 % (ref 11.5–15.5)
WBC: 5.7 10*3/uL (ref 4.0–10.5)
nRBC: 0 % (ref 0.0–0.2)

## 2020-04-24 LAB — MAGNESIUM: Magnesium: 1.9 mg/dL (ref 1.7–2.4)

## 2020-04-24 LAB — AMMONIA: Ammonia: 57 umol/L — ABNORMAL HIGH (ref 9–35)

## 2020-04-24 LAB — T4, FREE: Free T4: 1.25 ng/dL — ABNORMAL HIGH (ref 0.61–1.12)

## 2020-04-24 LAB — PHOSPHORUS: Phosphorus: 3.3 mg/dL (ref 2.5–4.6)

## 2020-04-24 LAB — VITAMIN B12: Vitamin B-12: 1936 pg/mL — ABNORMAL HIGH (ref 180–914)

## 2020-04-24 LAB — TSH: TSH: 5.49 u[IU]/mL — ABNORMAL HIGH (ref 0.350–4.500)

## 2020-04-24 MED ORDER — SODIUM CHLORIDE 0.9 % IV SOLN: INTRAVENOUS | Status: AC

## 2020-04-24 MED ORDER — CARVEDILOL 6.25 MG PO TABS
3.1250 mg | ORAL_TABLET | Freq: Two times a day (BID) | ORAL | Status: DC
  Administered 2020-04-25 – 2020-04-26 (×2): 3.125 mg via ORAL
  Filled 2020-04-24 (×3): qty 1

## 2020-04-24 NOTE — Progress Notes (Signed)
PROGRESS NOTE    Raymond [name]  TGG:[id number] DOB: November 06, 1960 DOA: 04/22/2020 PCP: Raymond [name]   Brief Narrative:  HPI per Dr. Valente [name] on 04/22/20 Raymond [name]  is a 59 y.o. Caucasian male coming from [facility] with a known history of seizure disorder, mental retardation, obsessive-compulsive disorder, dyslipidemia and severe mitral regurgitation and CHF, who presented to the emergency room with acute onset of altered mental status.  He was found on the floor at his SNF.  No reported dysuria, oliguria or hematuria or flank pain.  No reported cough or wheezing or dyspnea.  No reported chest pain or palpitations.  The patient is a very poor historian due to his altered mental status as well as underlying developmental delay.  Upon presentation to emergency room, blood pressure was 88/61 with otherwise normal vital signs.  It has come up to 102/75 with hydration and later on was down to 88/62.  Labs revealed mild hyponatremia 132 and albumin of 2.9 with total protein of 6.1, total bili of 1.3.  Ammonia level was elevated at 52.  High-sensitivity troponin was 10 and lactic acid 1.  CBC showed mild anemia and TSH came back 1.8.  Respiratory panel including COVID-19 PCR came back negative.  Urinalysis was unremarkable.  Urine drug screen was positive for MDMA.  He had head and neck CT scan revealing mild bilateral intracavernous ICA narrowing secondary to atherosclerotic disease with no large vessel occlusion or high-grade narrowing or dissection or aneurysm.  KG showed atrial fibrillation with controlled ventricular response of 97 with borderline intraventricular conduction delay and T wave inversion laterally.  The patient was given 1 L bolus of IV lactated Ringer and 1 g of IV Keppra.  He will be admitted to an observation medically monitored bed for further evaluation and management.  **Interim History Patient remained tachycardic today.  He did not answer questioning  given his current condition.  Unable to get in touch with his legal guardian.  Ammonia level is trending down we will continue IV fluid hydration but reduce the rate given his history of CHF  04/24/2020 IV fluids have stopped and he was little bit more interactive today but pulled out both her IVs and nursing states that his urine output has not been very much so we will start him back on IV fluids.  Unclear about what his baseline is and attempted to reach his legal guardian without success.  Assessment & Plan:   Active Problems:   AMS (altered mental status)   Encephalopathy acute  Acute Metabolic Encephalopathy  -Initially thought to be hepatic encephalopathy but he has no evidence of liver cirrhosis and his ammonia level is likely elevated in the setting of his Depakote -This is likely worsened by volume depletion and dehydration. -CTA Head and Cervical Spine showed "No acute intracranial process. Moderate cerebral atrophy and chronic microvascular ischemic changes. Mild bilateral intracavernous ICA narrowing secondary to atherosclerotic disease. No large vessel occlusion, high-grade narrowing, dissection or Aneurysm." -The patient will be admitted to an observation medical monitored bed. -He will be placed on p.o. lactulose. -Ammonia level went from 52 and is now 39 yesterday and today is 61 -We will follow neuro checks every 4 hours for 24 hours. -I called to speak with his legal guardian from the state and they did not answer again -We will follow daily ammonia level was slightly elevated -Place on delirium precautions -May need an MRI and neuro consultation -Continue monitor and trend carefully  Hypotension. -This  could be likely related to volume depletion and dehydration. -He was given normal saline IV fluids and then this was changed and stopped but have resumed today given diminished urine output -See below -During given a dose of IV albumin 25 g -Continued with normal  saline at 100 MLS per hour changed to 75 mils per hour for hours and reevaluate the need for fluids in the morning given his history of systolic CHF; IV fluids have now will be resumed at 75 MLS per hour for 16 hours -Last blood pressure reading was 118/79  Hyponatremia with borderline magnesium level.,  Hyponatremia is improved -This is mild and is likely related to volume depletion. -As mentioned above he will be hydrated with IV normal saline and will follow his BMP. -We will optimize his magnesium. -Na+ is slowly improving and went from 132 -> 134 -> 137 -Continue to monitor and trend repeat CMP in a.m.  Seizure Disorder. -When checking Keppra and Depakote levels.  -He was given IV Keppra in the ER and this will be continued at 1000 mg every 12h for 3 doses and change back to his home dose in the a.m.; he takes levetiracetam 1000 g p.o. daily along with Tegretol 200 g p.o. 3 times daily-valproic acid 500 mg 3 times daily but will need to verify his medications as pharmacy  -We will resume his carbamazepine 200 mg p.o. 3 times daily as well as his divalproex 500 mg p.o. 3 times daily as above; he no longer takes carbamazepine 200 g p.o. 3 times daily so this has not been stopped -Valproic acid level was 77 -He will will be placed on seizure precautions. -We we will need an accurate medication reconciliation  History of Hypertension. -His Coreg and Cozaar will be held off given his current hypotension. -Continue to monitor blood pressures per protocol -Last blood pressure reading was 118/79  Paroxysmal atrial fibrillation.   -He has been occasionally with rapid ventricular response  -We will continue Amiodarone 200 mg po Daily, Digoxin 0.0625 mg; Digoxin Level was 0.2 and low -C/w ASA 81 mg po Daily -PT/INR was 15.4/1.3 Respectively  -Resume carvedilol 3.125 mg p.o. twice daily since his BP is a little better -He is apparently a fall risk and therefore is not on  anticoagulation. -We will optimize his magnesium level as mentioned above. -C/w Telemetry Monitoring  Dyslipidemia. -We will continue his Fenofibrate 160 mg po Daily  Intellectual Disability/Mental Retardation with behavioral changes. -We will continue as needed Risperdal 0.5 mg po Daily and 1 mg qHS  GERD. -PPI therapy with Pantoprazole 40 mg po Daily resumed along with Famotidine 40 mg po qHS  Normocytic Anemia -Patient's hemoglobin/hematocrit went from 11.4/34.0 and is now 10.8/32.9 yesterday and today it is 10.5/30.4 -check anemia panel in a.m.; B12 level of 1936 -Continue to monitor for signs and symptoms of bleeding; currently no overt bleeding noted -Repeat CBC in a.m.  Thrombocytopenia -Mild with a platelet count of 123 and trended down to 110 yesterday and today is 109 -Continue to monitor for signs and symptoms of bleeding; currently no overt bleeding noted  -repeat CBC in AM  Severe Mitral Regurgitation -Recent echo done showed moderate to severe mitral valve regurgitation -Cautious IV fluid resuscitation  Chronic Systolic CHF -Currently appears slightly dry so is getting normal saline 100 MLS per hour and will reduce to 75 MLS per hour for another 13 hours yesterday but this was stopped and will resume given his diminished urine output -continue to  monitor for signs and symptoms of volume overload -Recent echocardiogram done showed an EF of 40 to 45% with normal left ventricular diastolic parameters -Repeat Chest x-ray in the a.m.  GOC: DNR, poA  DVT prophylaxis: Enoxaparin 40 mg sq q24h Code Status: DO NOT RESUSCITATE Family Communication: No family present at bedside and tried calling Legal Guardian Emergency Contact [name] and unavailable to speak.  Disposition Plan: Pending further improvement back to his baseline  Status is: Inpatient  Remains inpatient appropriate because:Unsafe d/c plan, IV treatments appropriate due to intensity of illness or  inability to take PO and Inpatient level of care appropriate due to severity of illness   Dispo: The patient is from: SNF              Anticipated d/c is to: SNF              Anticipated d/c date is: 1-2 days              Patient currently is not medically stable to d/c.  Consultants:   None   Procedures: None  Antimicrobials:  Anti-infectives (From admission, onward)   None        Subjective: Seen and examined at bedside and he was a little bit more interactive and did answer some questions and asked him how he is doing he states "all right".  I asked him if he was in any pain he said no.  Did not provide much else of the subjective history.  Denies any chest pain, lightheadedness or dizziness.  No other concerns or plans at this time.  Objective: Vitals:   04/24/20 0312 04/24/20 1221 04/24/20 1345 04/24/20 1615  BP: 91/71 108/69 118/79   Pulse: 90 (!) 113 (!) 104   Resp: 20 16 14    Temp: 98.6 F (37 C) 98.4 F (36.9 C) 97.7 F (36.5 C)   TempSrc:  Oral Oral   SpO2: 100% 100% 98% 96%  Weight:      Height:        Intake/Output Summary (Last 24 hours) at 04/24/2020 1759 Last data filed at 04/24/2020 1716 Gross per 24 hour  Intake 1420 ml  Output 300 ml  Net 1120 ml   Filed Weights   04/22/20 1549  Weight: 58 kg   Examination: Physical Exam:  Constitutional: The patient is a thin chronically ill-appearing Caucasian male who is unable to provide a subjective history secondary to his mental status but does interactive a bit more than he did yesterday Eyes: Lids and conjunctivae normal, sclerae anicteric; has some esotropia ENMT: External Ears, Nose appear normal. Grossly normal hearing.   Neck: Appears normal, supple, no cervical masses, normal ROM, no appreciable thyromegaly; no JVD Respiratory: Diminished to auscultation bilaterally, no wheezing, rales, rhonchi or crackles. Normal respiratory effort and patient is not tachypenic. No accessory muscle use.   Unlabored breathing Cardiovascular: Irregularly irregular slightly tachycardic, no murmurs / rubs / gallops. S1 and S2 auscultated. No extremity edema. 2+ pedal pulses. No carotid bruits.  Abdomen: Soft, non-tender, non-distended.  Bowel sounds positive.  GU: Deferred. Musculoskeletal: No clubbing / cyanosis of digits/nails. No joint deformity upper and lower extremities.  Skin: No rashes, lesions, ulcers on limited skin evaluation. No induration; Warm and dry.  Neurologic: Cannot really follow commands Psychiatric: Impaired judgment and insight.  He is awake but not fully alert and oriented x 3. Normal mood and appropriate affect.   Data Reviewed: I have personally reviewed following labs and imaging studies  CBC: Recent Labs  Lab 04/22/20 1605 04/23/20 0432 04/24/20 0333  WBC 8.5 8.6 5.7  NEUTROABS 5.6  --  3.1  HGB 11.4* 10.8* 10.5*  HCT 34.0* 32.9* 30.4*  MCV 95.2 97.1 93.8  PLT 123* 110* 109*   Basic Metabolic Panel: Recent Labs  Lab 04/22/20 1605 04/23/20 0432 04/24/20 0333  NA 132* 134* 137  K 4.0 3.9 3.7  CL 98 104 106  CO2 GLUCOSE 111* 88 110*  BUN CREATININE 0.94 0.78 0.65  CALCIUM 8.7* 8.2* 8.6*  MG 1.7  --  1.9  PHOS  --   --  3.3   GFR: Estimated Creatinine Clearance: 83.6 mL/min (by C-G formula based on SCr of 0.65 mg/dL). Liver Function Tests: Recent Labs  Lab 04/22/20 1605 04/23/20 0432 04/24/20 0333  AST 33 29 28  ALT ALKPHOS 27* 29* 29*  BILITOT 1.3* 1.2 1.0  PROT 6.1* 5.6* 5.8*  ALBUMIN 2.9* 2.7* 2.7*   No results for input(s): LIPASE, AMYLASE in the last 168 hours. Recent Labs  Lab 04/22/20 1604 04/23/20 0432 04/24/20 0333  AMMONIA 52* 39* 57*   Coagulation Profile: Recent Labs  Lab 04/22/20 2034  INR 1.3*   Cardiac Enzymes: No results for input(s): CKTOTAL, CKMB, CKMBINDEX, TROPONINI in the last 168 hours. BNP (last 3 results) No results for input(s): PROBNP in the last 8760 hours. HbA1C: No  results for input(s): HGBA1C in the last 72 hours. CBG: No results for input(s): GLUCAP in the last 168 hours. Lipid Profile: No results for input(s): CHOL, HDL, LDLCALC, TRIG, CHOLHDL, LDLDIRECT in the last 72 hours. Thyroid Function Tests: Recent Labs    04/24/20 0333  TSH 5.490*  FREET4 1.25*   Anemia Panel: Recent Labs    04/24/20 0333  VITAMINB12 1,936*   Sepsis Labs: Recent Labs  Lab 04/22/20 1604  LATICACIDVEN 1.0    Recent Results (from the past 240 hour(s))  Respiratory Panel by RT PCR (Flu A&B, Covid) - Nasopharyngeal Swab     Status: None   Collection Time: 04/22/20  4:04 PM   Specimen: Nasopharyngeal Swab  Result Value Ref Range Status   SARS Coronavirus 2 by RT PCR NEGATIVE NEGATIVE Final    Comment: (NOTE) SARS-CoV-2 target nucleic acids are NOT DETECTED.  The SARS-CoV-2 RNA is generally detectable in upper respiratoy specimens during the acute phase of infection. The lowest concentration of SARS-CoV-2 viral copies this assay can detect is 131 copies/mL. A negative result does not preclude SARS-Cov-2 infection and should not be used as the sole basis for treatment or other patient management decisions. A negative result may occur with  improper specimen collection/handling, submission of specimen other than nasopharyngeal swab, presence of viral mutation(s) within the areas targeted by this assay, and inadequate number of viral copies (<131 copies/mL). A negative result must be combined with clinical observations, patient history, and epidemiological information. The expected result is Negative.  Fact Sheet for Patients:  https://www.moore.com/  Fact Sheet for Healthcare Providers:  https://www.young.biz/  This test is no t yet approved or cleared by the [place] FDA and  has been authorized for detection and/or diagnosis of SARS-CoV-2 by FDA under an Emergency Use Authorization (EUA). This EUA will remain   in effect (meaning this test can be used) for the duration of the COVID-19 declaration under Section 564(b)(1) of the Act, 21 U.S.C. section 360bbb-3(b)(1), unless the authorization is terminated or revoked sooner.  Influenza A by PCR NEGATIVE NEGATIVE Final   Influenza B by PCR NEGATIVE NEGATIVE Final    Comment: (NOTE) The Xpert Xpress SARS-CoV-2/FLU/RSV assay is intended as an aid in  the diagnosis of influenza from Nasopharyngeal swab specimens and  should not be used as a sole basis for treatment. Nasal washings and  aspirates are unacceptable for Xpert Xpress SARS-CoV-2/FLU/RSV  testing.  Fact Sheet for Patients: https://www.moore.com/  Fact Sheet for Healthcare Providers: https://www.young.biz/  This test is not yet approved or cleared by the [place] FDA and  has been authorized for detection and/or diagnosis of SARS-CoV-2 by  FDA under an Emergency Use Authorization (EUA). This EUA will remain  in effect (meaning this test can be used) for the duration of the  Covid-19 declaration under Section 564(b)(1) of the Act, 21  U.S.C. section 360bbb-3(b)(1), unless the authorization is  terminated or revoked. Performed at [facility], [address] [place], [place] [address]      RN Pressure Injury Documentation:     Estimated body mass index is 17.34 kg/m as calculated from the following:   Height as of this encounter: 6' (1.829 m).   Weight as of this encounter: 58 kg.  Malnutrition Type:      Malnutrition Characteristics:      Nutrition Interventions:      Radiology Studies: CT Angio Head W or Wo Contrast  Result Date: 04/22/2020 CLINICAL DATA:  Neuro deficit EXAM: CT ANGIOGRAPHY HEAD TECHNIQUE: Multidetector CT imaging of the head was performed using the standard protocol during bolus administration of intravenous contrast. Multiplanar CT image reconstructions and MIPs were obtained to evaluate  the vascular anatomy. CONTRAST:  56mL OMNIPAQUE IOHEXOL 350 MG/ML SOLN COMPARISON:  03/26/2020 MRI head and prior.  03/23/2020 head CT. FINDINGS: CT HEAD Brain: No acute infarct or intracranial hemorrhage. Moderate cerebral atrophy with ex vacuo dilatation. Scattered and confluent hypodense foci involving the periventricular and subcortical white matter nonspecific however commonly associated with chronic microvascular ischemic changes. No mass lesion. No midline shift, ventriculomegaly or extra-axial fluid collection. Vascular: No hyperdense vessel or unexpected calcification. Bilateral skull base atherosclerotic calcifications. Skull: Negative for fracture or focal lesion. Sinuses/Orbits: Normal orbits. Clear paranasal sinuses. No mastoid effusion. Other: None. CTA HEAD Anterior circulation: Left A1 segment hypoplasia. Bilateral anterior and middle cerebral arteries are otherwise unremarkable. Bilateral carotid siphon atherosclerotic calcifications with mild narrowing of the intracavernous ICAs. Patent and normal caliber bilateral P comms. No significant stenosis, proximal occlusion, aneurysm, or vascular malformation. Posterior circulation: Dominant left vertebral artery. Patent normal caliber basilar, superior cerebellar and posterior cerebral arteries. No significant stenosis, proximal occlusion, aneurysm, or vascular malformation. Venous sinuses: As permitted by contrast timing, patent. Anatomic variants: None. IMPRESSION: CT HEAD: No acute intracranial process. Moderate cerebral atrophy and chronic microvascular ischemic changes. CTA HEAD: Mild bilateral intracavernous ICA narrowing secondary to atherosclerotic disease. No large vessel occlusion, high-grade narrowing, dissection or aneurysm. Electronically Signed   By: Stana Bunting M.D.   On: 04/22/2020 19:53   CT Cervical Spine Wo Contrast  Result Date: 04/22/2020 CLINICAL DATA:  Altered mental status EXAM: CT CERVICAL SPINE WITHOUT CONTRAST  TECHNIQUE: Multidetector CT imaging of the cervical spine was performed without intravenous contrast. Multiplanar CT image reconstructions were also generated. COMPARISON:  None. FINDINGS: Alignment: Straightening of lordosis.  No listhesis. Skull base and vertebrae: No acute fracture. No primary bone lesion or focal pathologic process. Soft tissues and spinal canal: No prevertebral fluid or swelling. No visible canal hematoma. Disc levels:  Multilevel endplate degenerative changes including bridging osteophytosis and disc space loss most prominent at the C6-7 level. Patent bony spinal canal. Moderate right C6-7 bony neural foraminal narrowing secondary to uncovertebral and facet hypertrophy. Upper chest: Clear lung apices. Other: None. IMPRESSION: No acute osseous abnormality or traumatic listhesis. Multilevel spondylosis. Moderate right C6-7 bony neural foraminal narrowing. Electronically Signed   By: Stana Bunting M.D.   On: 04/22/2020 20:45   Scheduled Meds: . amiodarone  200 mg Oral Daily  . aspirin EC  81 mg Oral Daily  . benztropine  0.5 mg Oral BID  . digoxin  0.0625 mg Oral Daily  . divalproex  500 mg Oral TID WC  . enoxaparin (LOVENOX) injection  40 mg Subcutaneous Q24H  . famotidine  40 mg Oral QHS  . fenofibrate  160 mg Oral Daily  . lactulose  20 g Oral TID  . levETIRAcetam  1,000 mg Oral Daily  . pantoprazole  40 mg Oral Daily  . psyllium  1 packet Oral Daily  . risperiDONE  0.5 mg Oral Daily  . risperiDONE  1 mg Oral QHS  . vitamin B-12  1,000 mcg Oral Daily   Continuous Infusions: . sodium chloride      LOS: 1 day   Merlene Laughter, DO [facility] PAGER is on AMION  If 7PM-7AM, please contact night-coverage [email or web address]

## 2020-04-24 NOTE — Plan of Care (Signed)
Continuing plan of care

## 2020-04-24 NOTE — ED Notes (Signed)
Patient incontinent of urine. Patient changed and repositioned at this time.  

## 2020-04-24 NOTE — TOC Progression Note (Signed)
Transition of Care [facility]) - Progression Note    Patient Details  Name: Raymond [name] MRN: [id number] Date of Birth: 04/27/1961  Transition of Care [facility]) CM/SW Contact  Raymond [name], [place] Phone Number: 04/24/2020, 11:09 AM  Clinical Narrative:      CSW attempted to call legal guardian/DSS worker, Raymond [name] [phone] or [phone]. CSW called [facility] twice and was unable to get anyone but was able to get in touch with Raymond [name] from [facility] who stated that he is welcome back and just needs the information. CSW completed FL2 and submitted for [facility] (awaiting pending). CSW will send out to [facility] for review and possible acceptance.      Expected Discharge Plan and Services                                                 Social Determinants of Health (SDOH) Interventions    Readmission Risk Interventions No flowsheet data found.

## 2020-04-24 NOTE — Progress Notes (Signed)
Patient has had low intake this shift with a poor appetite.  Urine output is for the shift and tea colored.  Patient pulled both PIVs out earlier in the shift as well.  Dr. Marland Mcalpine notified and ordered to restart IV fluids; notified IV team as well as patient is a difficult stick.

## 2020-04-24 NOTE — [place] FL2 (Signed)
[place] MEDICAID FL2 LEVEL OF CARE SCREENING TOOL     IDENTIFICATION  Patient Name: Raymond [name] Birthdate: July 28, 1961 Sex: male Admission Date (Current Location): 04/22/2020  [place] and [place] Number:  [name] [id number] [facility] Facility and Address:  [facility], [address], [place], [place] [address]      Provider Number: [id number]  Attending Physician Name and Address:  Merlene Laughter, DO  Relative Name and Phone Number:  Legal Garnet [place] [phone] (mobile) or [phone] (office)    Current Level of Care: Hospital Recommended Level of Care: Skilled Nursing Facility Prior Approval Number:    Date Approved/Denied:   PASRR Number: Awaiting pending of PASRR  Discharge Plan: SNF    Current Diagnoses: Patient Active Problem List   Diagnosis Date Noted   Encephalopathy acute 04/23/2020   AMS (altered mental status) 04/22/2020   Chronic systolic CHF (congestive heart failure) (HCC) 03/27/2020   Protein-calorie malnutrition, severe 03/05/2020   Acute kidney injury superimposed on CKD (HCC)    Goals of care, counseling/discussion    Palliative care by specialist    DNR (do not resuscitate) discussion    Acute on chronic diastolic (congestive) heart failure (HCC) 03/02/2020   Hypotension    Mitral valve insufficiency    Acute on chronic diastolic CHF (congestive heart failure) (HCC) 03/01/2020   HTN (hypertension) 03/01/2020   GERD (gastroesophageal reflux disease) 03/01/2020   New onset atrial fibrillation (HCC) 03/01/2020   Atrial fibrillation with RVR (HCC) 03/01/2020   Seizure (HCC)    Nose fracture 04/09/2016   Weakness 07/11/2015   Epileptic seizures (HCC) 01/12/2015   Intellectual disability    OCD (obsessive compulsive disorder)    Hypertriglyceridemia    Epilepsy (HCC)    Systolic murmur     Orientation RESPIRATION BLADDER Height & Weight      (Not  oriented/Intellectual Disability)  Normal Incontinent Weight: 127 lb 13.9 oz (58 kg) Height:  6' (182.9 cm)  BEHAVIORAL SYMPTOMS/MOOD NEUROLOGICAL BOWEL NUTRITION STATUS  Other (Comment) (Intellectual Disability) Convulsions/Seizures Incontinent Diet (Heart)  AMBULATORY STATUS COMMUNICATION OF NEEDS Skin   Supervision Non-Verbally Normal                       Personal Care Assistance Level of Assistance  Bathing, Feeding, Dressing Bathing Assistance: Limited assistance Feeding assistance: Limited assistance Dressing Assistance: Limited assistance     Functional Limitations Info             SPECIAL CARE FACTORS FREQUENCY                       Contractures Contractures Info: Not present    Additional Factors Info  Code Status Code Status Info: DNR             Current Medications (04/24/2020):  This is the current hospital active medication list Current Facility-Administered Medications  Medication Dose Route Frequency Provider Last Rate Last Admin   acetaminophen (TYLENOL) tablet 650 mg  650 mg Oral Q6H PRN Mansy, Jan A, MD       Or   acetaminophen (TYLENOL) suppository 650 mg  650 mg Rectal Q6H PRN Mansy, Jan A, MD       amiodarone (PACERONE) tablet 200 mg  200 mg Oral Daily Mansy, Jan A, MD   200 mg at 04/24/20 5188   aspirin EC tablet 81 mg  81 mg Oral Daily Mansy, Jan A, MD   81 mg at 04/24/20  9233   benztropine (COGENTIN) tablet 0.5 mg  0.5 mg Oral BID Marguerita Merles Latif, DO   0.5 mg at 04/24/20 0809   digoxin (LANOXIN) tablet 0.0625 mg  0.0625 mg Oral Daily Mansy, Jan A, MD   0.0625 mg at 04/23/20 1808   divalproex (DEPAKOTE) DR tablet 500 mg  500 mg Oral TID WC Sheikh, Omair Latif, DO       enoxaparin (LOVENOX) injection 40 mg  40 mg Subcutaneous Q24H Mansy, Jan A, MD   40 mg at 04/24/20 0015   famotidine (PEPCID) tablet 40 mg  40 mg Oral QHS Mansy, Jan A, MD   40 mg at 04/23/20 2253   fenofibrate tablet 160 mg  160 mg Oral Daily Mansy, Jan  A, MD   160 mg at 04/23/20 1811   lactulose (CHRONULAC) 10 GM/15ML solution 20 g  20 g Oral TID Mansy, Jan A, MD   20 g at 04/24/20 0076   levETIRAcetam (KEPPRA) tablet 1,000 mg  1,000 mg Oral Daily Marguerita Merles Bagnell, DO   1,000 mg at 04/24/20 2263   magnesium hydroxide (MILK OF MAGNESIA) suspension 30 mL  30 mL Oral Daily PRN Mansy, Jan A, MD       meclizine (ANTIVERT) tablet 12.5 mg  12.5 mg Oral TID PRN Mansy, Jan A, MD       ondansetron [facility]) tablet 4 mg  4 mg Oral Q6H PRN Mansy, Jan A, MD       Or   ondansetron [facility]) injection 4 mg  4 mg Intravenous Q6H PRN Mansy, Jan A, MD       pantoprazole (PROTONIX) EC tablet 40 mg  40 mg Oral Daily Mansy, Jan A, MD   40 mg at 04/24/20 0807   psyllium (HYDROCIL/METAMUCIL) 1 packet  1 packet Oral Daily Marguerita Merles [place], DO   1 packet at 04/23/20 1806   risperiDONE (RISPERDAL) tablet 0.5 mg  0.5 mg Oral Daily Mansy, Jan A, MD   0.5 mg at 04/24/20 3354   risperiDONE (RISPERDAL) tablet 1 mg  1 mg Oral QHS Mansy, Jan A, MD   1 mg at 04/23/20 2253   traZODone (DESYREL) tablet 25 mg  25 mg Oral QHS PRN Mansy, Jan A, MD       vitamin B-12 (CYANOCOBALAMIN) tablet 1,000 mcg  1,000 mcg Oral Daily Mansy, Jan A, MD   1,000 mcg at 04/24/20 0807     Discharge Medications: Please see discharge summary for a list of discharge medications.  Relevant Imaging Results:  Relevant Lab Results:   Additional Information SSN:[id number]  [place], [place]

## 2020-04-25 ENCOUNTER — Inpatient Hospital Stay: Payer: Medicare Other

## 2020-04-25 DIAGNOSIS — G9340 Encephalopathy, unspecified: Secondary | ICD-10-CM | POA: Diagnosis not present

## 2020-04-25 DIAGNOSIS — R4182 Altered mental status, unspecified: Secondary | ICD-10-CM | POA: Diagnosis not present

## 2020-04-25 LAB — FERRITIN: Ferritin: 357 ng/mL — ABNORMAL HIGH (ref 24–336)

## 2020-04-25 LAB — CBC WITH DIFFERENTIAL/PLATELET
Abs Immature Granulocytes: 0.03 10*3/uL (ref 0.00–0.07)
Basophils Absolute: 0 10*3/uL (ref 0.0–0.1)
Basophils Relative: 1 %
Eosinophils Absolute: 0 10*3/uL (ref 0.0–0.5)
Eosinophils Relative: 0 %
HCT: 30.1 % — ABNORMAL LOW (ref 39.0–52.0)
Hemoglobin: 9.7 g/dL — ABNORMAL LOW (ref 13.0–17.0)
Immature Granulocytes: 1 %
Lymphocytes Relative: 43 %
Lymphs Abs: 2.3 10*3/uL (ref 0.7–4.0)
MCH: 31.7 pg (ref 26.0–34.0)
MCHC: 32.2 g/dL (ref 30.0–36.0)
MCV: 98.4 fL (ref 80.0–100.0)
Monocytes Absolute: 0.7 10*3/uL (ref 0.1–1.0)
Monocytes Relative: 12 %
Neutro Abs: 2.4 10*3/uL (ref 1.7–7.7)
Neutrophils Relative %: 43 %
Platelets: 137 10*3/uL — ABNORMAL LOW (ref 150–400)
RBC: 3.06 MIL/uL — ABNORMAL LOW (ref 4.22–5.81)
RDW: 14.5 % (ref 11.5–15.5)
WBC: 5.4 10*3/uL (ref 4.0–10.5)
nRBC: 0 % (ref 0.0–0.2)

## 2020-04-25 LAB — COMPREHENSIVE METABOLIC PANEL
ALT: 14 U/L (ref 0–44)
AST: 25 U/L (ref 15–41)
Albumin: 2.6 g/dL — ABNORMAL LOW (ref 3.5–5.0)
Alkaline Phosphatase: 24 U/L — ABNORMAL LOW (ref 38–126)
Anion gap: 8 (ref 5–15)
BUN: 6 mg/dL (ref 6–20)
CO2: 24 mmol/L (ref 22–32)
Calcium: 8.6 mg/dL — ABNORMAL LOW (ref 8.9–10.3)
Chloride: 108 mmol/L (ref 98–111)
Creatinine, Ser: 0.78 mg/dL (ref 0.61–1.24)
GFR calc Af Amer: >60 mL/min (ref >60)
GFR calc non Af Amer: >60 mL/min (ref >60)
Glucose, Bld: 92 mg/dL (ref 70–99)
Potassium: 3.8 mmol/L (ref 3.5–5.1)
Sodium: 140 mmol/L (ref 135–145)
Total Bilirubin: 0.9 mg/dL (ref 0.3–1.2)
Total Protein: 5.5 g/dL — ABNORMAL LOW (ref 6.5–8.1)

## 2020-04-25 LAB — IRON AND TIBC
Iron: 42 ug/dL — ABNORMAL LOW (ref 45–182)
Saturation Ratios: 19 % (ref 17.9–39.5)
TIBC: 227 ug/dL — ABNORMAL LOW (ref 250–450)
UIBC: 185 ug/dL

## 2020-04-25 LAB — RETICULOCYTES
Immature Retic Fract: 12.9 % (ref 2.3–15.9)
RBC.: 3.13 MIL/uL — ABNORMAL LOW (ref 4.22–5.81)
Retic Count, Absolute: 58.2 10*3/uL (ref 19.0–186.0)
Retic Ct Pct: 1.9 % (ref 0.4–3.1)

## 2020-04-25 LAB — PHOSPHORUS: Phosphorus: 3.7 mg/dL (ref 2.5–4.6)

## 2020-04-25 LAB — VITAMIN B12: Vitamin B-12: 1830 pg/mL — ABNORMAL HIGH (ref 180–914)

## 2020-04-25 LAB — AMMONIA: Ammonia: 67 umol/L — ABNORMAL HIGH (ref 9–35)

## 2020-04-25 LAB — FOLATE: Folate: 25 ng/mL (ref >5.9)

## 2020-04-25 LAB — MAGNESIUM: Magnesium: 1.6 mg/dL — ABNORMAL LOW (ref 1.7–2.4)

## 2020-04-25 MED ORDER — MIDODRINE HCL 5 MG PO TABS
10.0000 mg | ORAL_TABLET | Freq: Three times a day (TID) | ORAL | Status: DC
  Administered 2020-04-25 – 2020-04-26 (×2): 10 mg via ORAL
  Filled 2020-04-25 (×10): qty 2

## 2020-04-25 MED ORDER — POLYSACCHARIDE IRON COMPLEX 150 MG PO CAPS
150.0000 mg | ORAL_CAPSULE | Freq: Every day | ORAL | Status: DC
  Administered 2020-04-25 – 2020-04-26 (×2): 150 mg via ORAL
  Filled 2020-04-25 (×4): qty 1

## 2020-04-25 MED ORDER — ALBUTEROL SULFATE HFA 108 (90 BASE) MCG/ACT IN AERS
2.0000 | INHALATION_SPRAY | Freq: Four times a day (QID) | RESPIRATORY_TRACT | Status: DC | PRN
  Filled 2020-04-25: qty 6.7

## 2020-04-25 MED ORDER — MIDODRINE HCL 5 MG PO TABS
10.0000 mg | ORAL_TABLET | Freq: Three times a day (TID) | ORAL | Status: DC
  Filled 2020-04-25 (×2): qty 2

## 2020-04-25 MED ORDER — MAGNESIUM SULFATE 2 GM/50ML IV SOLN
2.0000 g | Freq: Once | INTRAVENOUS | Status: AC
  Administered 2020-04-25: 2 g via INTRAVENOUS
  Filled 2020-04-25: qty 50

## 2020-04-25 MED ORDER — ORAL CARE MOUTH RINSE
15.0000 mL | Freq: Two times a day (BID) | OROMUCOSAL | Status: DC
  Administered 2020-04-25 – 2020-05-03 (×16): 15 mL via OROMUCOSAL

## 2020-04-25 MED ORDER — SODIUM CHLORIDE 0.9 % IV SOLN
INTRAVENOUS | Status: DC | PRN
  Administered 2020-04-25 – 2020-05-02 (×2): 250 mL via INTRAVENOUS

## 2020-04-25 MED ORDER — ADULT MULTIVITAMIN W/MINERALS CH
1.0000 | ORAL_TABLET | Freq: Every day | ORAL | Status: DC
  Administered 2020-04-25 – 2020-04-26 (×2): 1 via ORAL
  Filled 2020-04-25 (×2): qty 1

## 2020-04-25 MED ORDER — LORATADINE 10 MG PO TABS
10.0000 mg | ORAL_TABLET | Freq: Every day | ORAL | Status: DC
  Administered 2020-04-25 – 2020-04-26 (×2): 10 mg via ORAL
  Filled 2020-04-25 (×2): qty 1

## 2020-04-25 MED ORDER — FLUTICASONE PROPIONATE 50 MCG/ACT NA SUSP
2.0000 | Freq: Every day | NASAL | Status: DC
  Administered 2020-04-26 – 2020-05-03 (×6): 2 via NASAL
  Filled 2020-04-25: qty 16

## 2020-04-25 MED ORDER — SODIUM CHLORIDE 0.9 % IV BOLUS
500.0000 mL | Freq: Once | INTRAVENOUS | Status: AC
  Administered 2020-04-25: 500 mL via INTRAVENOUS

## 2020-04-25 MED ORDER — GLYCOPYRROLATE 1 MG PO TABS
1.0000 mg | ORAL_TABLET | Freq: Two times a day (BID) | ORAL | Status: DC
  Administered 2020-04-25 – 2020-04-26 (×2): 1 mg via ORAL
  Filled 2020-04-25 (×7): qty 1

## 2020-04-25 MED ORDER — LEVETIRACETAM 500 MG PO TABS
1500.0000 mg | ORAL_TABLET | Freq: Every day | ORAL | Status: DC
  Administered 2020-04-25: 1500 mg via ORAL
  Filled 2020-04-25: qty 3

## 2020-04-25 NOTE — Progress Notes (Signed)
PT Cancellation Note  Patient Details Name: Raymond [name] MRN: [id number] DOB: 06/10/1961   Cancelled Treatment:    Reason Eval/Treat Not Completed: Other (comment) Patient is sleeping and unable to awaken despite verbal and tactile stimuli. Will hold at this time and re-attempt next date/time as available for PT tx session.   Katherine Basset, SPT Baker Pierini 04/25/2020, 10:00 AM

## 2020-04-25 NOTE — Progress Notes (Signed)
Raymond [name]   Raymond [name]  [name]:[id number] DOB: Apr 09, 1961 DOA: 04/22/2020 PCP: [name]   Brief Narrative:  HPI per Dr. Valente David on 04/22/20 Raymond [name]  is a 59 y.o. Caucasian male coming from [facility] with a known history of seizure disorder, mental retardation, obsessive-compulsive disorder, dyslipidemia and severe mitral regurgitation and CHF, who presented to the emergency room with acute onset of altered mental status.  He was found on the floor at his SNF.  No reported dysuria, oliguria or hematuria or flank pain.  No reported cough or wheezing or dyspnea.  No reported chest pain or palpitations.  The patient is a very poor historian due to his altered mental status as well as underlying developmental delay.  Upon presentation to emergency room, blood pressure was 88/61 with otherwise normal vital signs.  It has come up to 102/75 with hydration and later on was down to 88/62.  Labs revealed mild hyponatremia 132 and albumin of 2.9 with total protein of 6.1, total bili of 1.3.  Ammonia level was elevated at 52.  High-sensitivity troponin was 10 and lactic acid 1.  CBC showed mild anemia and TSH came back 1.8.  Respiratory panel including COVID-19 PCR came back negative.  Urinalysis was unremarkable.  Urine drug screen was positive for MDMA.  He had head and neck CT scan revealing mild bilateral intracavernous ICA narrowing secondary to atherosclerotic disease with no large vessel occlusion or high-grade narrowing or dissection or aneurysm.  KG showed atrial fibrillation with controlled ventricular response of 97 with borderline intraventricular conduction delay and T wave inversion laterally.  The patient was given 1 L bolus of IV lactated Ringer and 1 g of IV Keppra.  He will be admitted to an observation medically monitored bed for further evaluation and management.  **Interim History Patient remained tachycardic today.  He did not answer questioning  given his current condition.  Unable to get in touch with his legal guardian.  Ammonia level is trending down we will continue IV fluid hydration but reduce the rate given his history of CHF  04/24/2020 IV fluids have stopped and he was little bit more interactive today but pulled out both her IVs and nursing states that his urine output has not been very much so we will start him back on IV fluids.  Unclear about what his baseline is and attempted to reach his legal guardian without success.  04/25/2020 Is a little somnolent and drowsy this morning and blood pressure was low so he is given a bolus.  His home medications were reviewed again and some of them around they have been adjusted.  We will resume his home Keppra at 1500 mg p.o. nightly and continue with as milligrams p.o. daily.  We will also resume his home midodrine given his hypotension.  Assessment & Plan:   Active Problems:   AMS (altered mental status)   Encephalopathy acute  Acute Metabolic Encephalopathy  -Initially thought to be hepatic encephalopathy but he has no evidence of liver cirrhosis and his ammonia level is likely elevated in the setting of his Depakote; ammonia level is 67 today -This is likely worsened by volume depletion and dehydration. -CTA Head and Cervical Spine showed "No acute intracranial process. Moderate cerebral atrophy and chronic microvascular ischemic changes. Mild bilateral intracavernous ICA narrowing secondary to atherosclerotic disease. No large vessel occlusion, high-grade narrowing, dissection or Aneurysm." -The patient will be admitted to an observation medical monitored bed. -He will be placed on  p.o. lactulose. -He was somnolent and Lethargic this AM but woke up later on this afternoon for Nursing  -Ammonia level went from 52 -> 39 -> 57 -> 67 -We will follow neuro checks every 4 hours for 24 hours -We will follow daily ammonia level was slightly elevated -Place on delirium precautions -May  need an MRI and neuro consultation -SLP evaluated and recommending dysphagia 1 diet with nectar thick liquids and aspiration precautions -UDS was MDMA was positive but ? If this was a false positive cardiac medications and can be false positive with trazodone and fenofibrate both of which patient is on; will ask lab to re-run the sample. -Continue monitor and trend carefully  Hypotension. -This could be likely related to volume depletion and dehydration. -He was given normal saline IV fluids and then this was changed and stopped but have resumed today given diminished urine output -See below -He was given a dose of IV albumin 25 g -Continued with normal saline at 100 MLS per hour changed to 75 mils per hour for hours and reevaluate the need for fluids in the morning given his history of systolic CHF; IV fluids have now will be resumed at 75 MLS per hour for 16 hours and now stopped; given a bolus of 500 mL at 125 mils per hour this morning given his hypotension -Blood pressure is morning was 89/63 and is now 97/67  -We will resume home midodrine  Hyponatremia with borderline magnesium level.,  Hyponatremia is improved -This is mild and is likely related to volume depletion. -As mentioned above he will be hydrated with IV normal saline and will follow his BMP. -We will optimize his magnesium. -Na+ is slowly improving and went from 132 -> 134 -> 137 -> 140 -Continue to monitor and trend repeat CMP in a.m.  Seizure Disorder. -When checking Keppra and Depakote levels.  -He was given IV Keppra in the ER and this will be continued at 1000 mg every 12h for 3 doses and change back to his home dose in the a.m.; he takes levetiracetam 1000 g p.o. daily and after further review he takes 1500 mg p.o. nightly and does not actually take carbamazepine -Patient no longer takes carbamazepine 200 g p.o. 3 times daily and this had not been stopped but will continue his divalproex 500 mg p.o. 3 times  daily as above;  -Valproic acid level was 77 -He will will be placed on seizure precautions. -We we will need an accurate medication reconciliation  History of Hypertension. -His Coreg and Cozaar will be held off given his current hypotension. -Continue to monitor blood pressures per protocol -Last blood pressure reading was 97/67 and will resume his home midodrine  Paroxysmal atrial fibrillation.   -He has been occasionally with rapid ventricular response  -We will continue Amiodarone 200 mg po Daily, Digoxin 0.0625 mg; Digoxin Level was 0.2 and low -C/w ASA 81 mg po Daily -PT/INR was 15.4/1.3 Respectively  -Resume carvedilol 3.125 mg p.o. twice daily since his BP is a little better -He is apparently a fall risk and therefore is not on anticoagulation. -We will optimize his magnesium level as mentioned above. -C/w Telemetry Monitoring  Dyslipidemia. -We will continue his Fenofibrate 160 mg po Daily  Intellectual Disability/Mental Retardation with behavioral changes. -We will continue as needed Risperdal 0.5 mg po Daily and 1 mg qHS  GERD. -PPI therapy with Pantoprazole 40 mg po Daily resumed along with Famotidine 40 mg po qHS  Normocytic Anemia -Patient's hemoglobin/hematocrit went  from 11.4/34.0 and is now 10.8/32.9 yesterday and today it is 10.5/30.4 -Check anemia panel and showed an iron level of 42, U IBC 185, TIBC 227, saturation ratios of 19%, ferritin of 357, folate level 25.0, and vitamin B12 level of 1830 today -Continue to monitor for signs and symptoms of bleeding; currently no overt bleeding noted -Repeat CBC in a.m.  Hypomagnesemia -Patient magnesium level is 1.6 -Replete with IV mag sulfate 2 g  -Continue monitor and replete as necessary  -Repeat CMP in a.m.  Thrombocytopenia -Mild with a platelet count of 123 and trended down to 109 yesterday but today it is improved to 137 -Continue to monitor for signs and symptoms of bleeding; currently no overt  bleeding noted  -repeat CBC in AM  Severe Mitral Regurgitation -Recent echo done showed moderate to severe mitral valve regurgitation -Cautious IV fluid resuscitation  Chronic Systolic CHF -Currently appears slightly dry so is getting normal saline 100 MLS per hour and will reduce to 75 MLS per hour for another 13 hours yesterday but this was stopped and will resume given his diminished urine output -continue to monitor for signs and symptoms of volume overload -Recent echocardiogram done showed an EF of 40 to 45% with normal left ventricular diastolic parameters -Given a dose of normal saline foreign levels at 125 an hour -Repeat Chest x-ray in the a.m.  GOC: DNR, poA  DVT prophylaxis: Enoxaparin 40 mg sq q24h Code Status: DO NOT RESUSCITATE Family Communication: No family present at bedside  Disposition Plan: Pending further improvement back to his baseline  Status is: Inpatient  Remains inpatient appropriate because:Unsafe d/c plan, IV treatments appropriate due to intensity of illness or inability to take PO and Inpatient level of care appropriate due to severity of illness   Dispo: The patient is from: SNF              Anticipated d/c is to: SNF              Anticipated d/c date is: 1-2 days              Patient currently is not medically stable to d/c.  Consultants:   None   Procedures: None  Antimicrobials:  Anti-infectives (From admission, onward)   None        Subjective: Seen and examined at bedside and he was somnolent and lethargic this morning but did interact and did answer questions very minimally.  Wanting respite later on in the afternoon he woke up for the nurse no more interactive and took the medications.  He is still unable to provide a subjective history.  We have adjusted some of his medications.  No other concerns or complaints at this time.  Objective: Vitals:   04/24/20 2043 04/25/20 0623 04/25/20 1306 04/25/20 1308  BP: 106/76 (!) 89/63  99/71 97/67  Pulse: (!) 107 70 99 83  Resp: 18 20 20    Temp: 97.8 F (36.6 C) 98 F (36.7 C) 97.8 F (36.6 C)   TempSrc: Oral Oral Oral   SpO2: 99% 98% 96% 98%  Weight:      Height:        Intake/Output Summary (Last 24 hours) at 04/25/2020 1525 Last data filed at 04/25/2020 0500 Gross per 24 hour  Intake 724.24 ml  Output 650 ml  Net 74.24 ml   Filed Weights   04/22/20 1549  Weight: 58 kg   Examination: Physical Exam:  Constitutional: The patient is a thin chronically ill-appearing Caucasian male  who is unable to provide a subjective history and he is more somnolent and lethargic today than he was yesterday.  Does answer some questions and shake his head yes and no but cannot get very much from him Eyes: Lids and conjunctivae normal, sclerae anicteric  ENMT: External Ears, Nose appear normal. Grossly normal hearing.  Neck: Appears normal, supple, no cervical masses, normal ROM, no appreciable thyromegaly medical no JVD Respiratory: Diminished to auscultation bilaterally, no wheezing, rales, rhonchi or crackles. Normal respiratory effort and patient is not tachypenic. No accessory muscle use.  Unlabored breathing Cardiovascular: Irregularly irregular and mildly tachycardic, no murmurs / rubs / gallops. S1 and S2 auscultated.  No appreciable lower extremity edema Abdomen: Soft, non-tender, non-distended.  Bowel sounds positive.  GU: Deferred. Musculoskeletal: No clubbing / cyanosis of digits/nails. No joint deformity upper and lower extremities.  Skin: No rashes, lesions, ulcers on limited skin evaluation. No induration; Warm and dry.  Neurologic: CN 2-12 grossly intact with no focal deficits.  Romberg sign and cerebellar reflexes not assessed.  Psychiatric: Impaired judgment and insight.  He is somnolent and drowsy. Normal mood and appropriate affect.   Data Reviewed: I have personally reviewed following labs and imaging studies  CBC: Recent Labs  Lab 04/22/20 1605  04/23/20 0432 04/24/20 0333 04/25/20 0506  WBC 8.5 8.6 5.7 5.4  NEUTROABS 5.6  --  3.1 2.4  HGB 11.4* 10.8* 10.5* 9.7*  HCT 34.0* 32.9* 30.4* 30.1*  MCV 95.2 97.1 93.8 98.4  PLT 123* 110* 109* 137*   Basic Metabolic Panel: Recent Labs  Lab 04/22/20 1605 04/23/20 0432 04/24/20 0333 04/25/20 0506  NA 132* 134* 137 140  K 4.0 3.9 3.7 3.8  CL 98 104 106 108  CO2 22 23 23 24   GLUCOSE 111* 88 110* 92  BUN 14 10 8 6   CREATININE 0.94 0.78 0.65 0.78  CALCIUM 8.7* 8.2* 8.6* 8.6*  MG 1.7  --  1.9 1.6*  PHOS  --   --  3.3 3.7   GFR: Estimated Creatinine Clearance: 83.6 mL/min (by C-G formula based on SCr of 0.78 mg/dL). Liver Function Tests: Recent Labs  Lab 04/22/20 1605 04/23/20 0432 04/24/20 0333 04/25/20 0506  AST 33 29 28 25   ALT 16 12 15 14   ALKPHOS 27* 29* 29* 24*  BILITOT 1.3* 1.2 1.0 0.9  PROT 6.1* 5.6* 5.8* 5.5*  ALBUMIN 2.9* 2.7* 2.7* 2.6*   No results for input(s): LIPASE, AMYLASE in the last 168 hours. Recent Labs  Lab 04/22/20 1604 04/23/20 0432 04/24/20 0333 04/25/20 0506  AMMONIA 52* 39* 57* 67*   Coagulation Profile: Recent Labs  Lab 04/22/20 2034  INR 1.3*   Cardiac Enzymes: No results for input(s): CKTOTAL, CKMB, CKMBINDEX, TROPONINI in the last 168 hours. BNP (last 3 results) No results for input(s): PROBNP in the last 8760 hours. HbA1C: No results for input(s): HGBA1C in the last 72 hours. CBG: No results for input(s): GLUCAP in the last 168 hours. Lipid Profile: No results for input(s): CHOL, HDL, LDLCALC, TRIG, CHOLHDL, LDLDIRECT in the last 72 hours. Thyroid Function Tests: Recent Labs    04/24/20 0333  TSH 5.490*  FREET4 1.25*   Anemia Panel: Recent Labs    04/24/20 0333 04/25/20 0506  VITAMINB12 1,936* 1,830*  FOLATE  --  25.0  FERRITIN  --  357*  TIBC  --  227*  IRON  --  42*  RETICCTPCT  --  1.9   Sepsis Labs: Recent Labs  Lab 04/22/20 1604  LATICACIDVEN 1.0    Recent Results (from the past 240 hour(s))   Respiratory Panel by RT PCR (Flu A&B, Covid) - Nasopharyngeal Swab     Status: None   Collection Time: 04/22/20  4:04 PM   Specimen: Nasopharyngeal Swab  Result Value Ref Range Status   SARS Coronavirus 2 by RT PCR NEGATIVE NEGATIVE Final    Comment: (NOTE) SARS-CoV-2 target nucleic acids are NOT DETECTED.  The SARS-CoV-2 RNA is generally detectable in upper respiratoy specimens during the acute phase of infection. The lowest concentration of SARS-CoV-2 viral copies this assay can detect is 131 copies/mL. A negative result does not preclude SARS-Cov-2 infection and should not be used as the sole basis for treatment or other patient management decisions. A negative result may occur with  improper specimen collection/handling, submission of specimen other than nasopharyngeal swab, presence of viral mutation(s) within the areas targeted by this assay, and inadequate number of viral copies (<131 copies/mL). A negative result must be combined with clinical observations, patient history, and epidemiological information. The expected result is Negative.  Fact Sheet for Patients:  https://www.moore.com/  Fact Sheet for Healthcare Providers:  https://www.young.biz/  This test is no t yet approved or cleared by the [place] FDA and  has been authorized for detection and/or diagnosis of SARS-CoV-2 by FDA under an Emergency Use Authorization (EUA). This EUA will remain  in effect (meaning this test can be used) for the duration of the COVID-19 declaration under Section 564(b)(1) of the Act, 21 U.S.C. section 360bbb-3(b)(1), unless the authorization is terminated or revoked sooner.     Influenza A by PCR NEGATIVE NEGATIVE Final   Influenza B by PCR NEGATIVE NEGATIVE Final    Comment: (NOTE) The Xpert Xpress SARS-CoV-2/FLU/RSV assay is intended as an aid in  the diagnosis of influenza from Nasopharyngeal swab specimens and  should not be used as  a sole basis for treatment. Nasal washings and  aspirates are unacceptable for Xpert Xpress SARS-CoV-2/FLU/RSV  testing.  Fact Sheet for Patients: https://www.moore.com/  Fact Sheet for Healthcare Providers: https://www.young.biz/  This test is not yet approved or cleared by the [place] FDA and  has been authorized for detection and/or diagnosis of SARS-CoV-2 by  FDA under an Emergency Use Authorization (EUA). This EUA will remain  in effect (meaning this test can be used) for the duration of the  Covid-19 declaration under Section 564(b)(1) of the Act, 21  U.S.C. section 360bbb-3(b)(1), unless the authorization is  terminated or revoked. Performed at [facility], [address] [place], [place] [address]      RN Pressure Injury Documentation:     Estimated body mass index is 17.34 kg/m as calculated from the following:   Height as of this encounter: 6' (1.829 m).   Weight as of this encounter: 58 kg.  Malnutrition Type:      Malnutrition Characteristics:      Nutrition Interventions:      Radiology Studies: No results found. Scheduled Meds: . amiodarone  200 mg Oral Daily  . aspirin EC  81 mg Oral Daily  . benztropine  0.5 mg Oral BID  . carvedilol  3.125 mg Oral BID WC  . digoxin  0.0625 mg Oral Daily  . divalproex  500 mg Oral TID WC  . enoxaparin (LOVENOX) injection  40 mg Subcutaneous Q24H  . famotidine  40 mg Oral QHS  . fenofibrate  160 mg Oral Daily  . fluticasone  2 spray Each Nare Daily  .  glycopyrrolate  1 mg Oral BID  . iron polysaccharides  150 mg Oral Daily  . lactulose  20 g Oral TID  . levETIRAcetam  1,000 mg Oral Daily  . levETIRAcetam  1,500 mg Oral QHS  . loratadine  10 mg Oral Daily  . midodrine  10 mg Oral TID  . multivitamin with minerals  1 tablet Oral Daily  . pantoprazole  40 mg Oral Daily  . psyllium  1 packet Oral Daily  . risperiDONE  0.5 mg Oral Daily  . risperiDONE   1 mg Oral QHS  . vitamin B-12  1,000 mcg Oral Daily   Continuous Infusions: . sodium chloride 250 mL (04/25/20 1227)    LOS: 2 days   Merlene Laughtermair Latif Makaiya Geerdes, DO [facility] PAGER is on AMION  If 7PM-7AM, please contact night-coverage [email or web address]
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
## 2020-04-26 DIAGNOSIS — G9340 Encephalopathy, unspecified: Secondary | ICD-10-CM | POA: Diagnosis not present

## 2020-04-26 DIAGNOSIS — R4182 Altered mental status, unspecified: Secondary | ICD-10-CM | POA: Diagnosis not present

## 2020-04-26 DIAGNOSIS — I959 Hypotension, unspecified: Secondary | ICD-10-CM

## 2020-04-26 LAB — COMPREHENSIVE METABOLIC PANEL
ALT: 13 U/L (ref 0–44)
AST: 26 U/L (ref 15–41)
Albumin: 2.6 g/dL — ABNORMAL LOW (ref 3.5–5.0)
Alkaline Phosphatase: 30 U/L — ABNORMAL LOW (ref 38–126)
Anion gap: 10 (ref 5–15)
BUN: 8 mg/dL (ref 6–20)
CO2: 24 mmol/L (ref 22–32)
Calcium: 8.7 mg/dL — ABNORMAL LOW (ref 8.9–10.3)
Chloride: 106 mmol/L (ref 98–111)
Creatinine, Ser: 0.85 mg/dL (ref 0.61–1.24)
GFR calc Af Amer: >60 mL/min (ref >60)
GFR calc non Af Amer: >60 mL/min (ref >60)
Glucose, Bld: 113 mg/dL — ABNORMAL HIGH (ref 70–99)
Potassium: 3.6 mmol/L (ref 3.5–5.1)
Sodium: 140 mmol/L (ref 135–145)
Total Bilirubin: 0.7 mg/dL (ref 0.3–1.2)
Total Protein: 5.6 g/dL — ABNORMAL LOW (ref 6.5–8.1)

## 2020-04-26 LAB — CBC WITH DIFFERENTIAL/PLATELET
Abs Immature Granulocytes: 0.04 10*3/uL (ref 0.00–0.07)
Basophils Absolute: 0.1 10*3/uL (ref 0.0–0.1)
Basophils Relative: 1 %
Eosinophils Absolute: 0.3 10*3/uL (ref 0.0–0.5)
Eosinophils Relative: 5 %
HCT: 30.1 % — ABNORMAL LOW (ref 39.0–52.0)
Hemoglobin: 10.5 g/dL — ABNORMAL LOW (ref 13.0–17.0)
Immature Granulocytes: 1 %
Lymphocytes Relative: 40 %
Lymphs Abs: 2 10*3/uL (ref 0.7–4.0)
MCH: 32.6 pg (ref 26.0–34.0)
MCHC: 34.9 g/dL (ref 30.0–36.0)
MCV: 93.5 fL (ref 80.0–100.0)
Monocytes Absolute: 0.6 10*3/uL (ref 0.1–1.0)
Monocytes Relative: 12 %
Neutro Abs: 2.1 10*3/uL (ref 1.7–7.7)
Neutrophils Relative %: 41 %
Platelets: 148 10*3/uL — ABNORMAL LOW (ref 150–400)
RBC: 3.22 MIL/uL — ABNORMAL LOW (ref 4.22–5.81)
RDW: 14.6 % (ref 11.5–15.5)
WBC: 5.1 10*3/uL (ref 4.0–10.5)
nRBC: 0 % (ref 0.0–0.2)

## 2020-04-26 LAB — AMMONIA: Ammonia: 82 umol/L — ABNORMAL HIGH (ref 9–35)

## 2020-04-26 LAB — MAGNESIUM: Magnesium: 2 mg/dL (ref 1.7–2.4)

## 2020-04-26 LAB — PHOSPHORUS: Phosphorus: 3.6 mg/dL (ref 2.5–4.6)

## 2020-04-26 MED ORDER — LACTULOSE ENEMA
300.0000 mL | Freq: Every day | ORAL | Status: DC
  Administered 2020-04-26: 300 mL via RECTAL
  Filled 2020-04-26 (×3): qty 300

## 2020-04-26 MED ORDER — VALPROATE SODIUM 500 MG/5ML IV SOLN
500.0000 mg | Freq: Three times a day (TID) | INTRAVENOUS | Status: DC
  Administered 2020-04-26 – 2020-04-28 (×5): 500 mg via INTRAVENOUS
  Filled 2020-04-26 (×7): qty 5

## 2020-04-26 MED ORDER — LEVETIRACETAM IN NACL 1000 MG/100ML IV SOLN
1000.0000 mg | INTRAVENOUS | Status: DC
  Administered 2020-04-27: 1000 mg via INTRAVENOUS
  Filled 2020-04-26 (×3): qty 100

## 2020-04-26 MED ORDER — FAMOTIDINE IN NACL 20-0.9 MG/50ML-% IV SOLN
20.0000 mg | INTRAVENOUS | Status: DC
  Administered 2020-04-26 – 2020-05-02 (×7): 20 mg via INTRAVENOUS
  Filled 2020-04-26 (×7): qty 50

## 2020-04-26 MED ORDER — PANTOPRAZOLE SODIUM 40 MG IV SOLR
40.0000 mg | INTRAVENOUS | Status: DC
  Administered 2020-04-27 – 2020-05-03 (×7): 40 mg via INTRAVENOUS
  Filled 2020-04-26 (×7): qty 40

## 2020-04-26 MED ORDER — LEVETIRACETAM IN NACL 1500 MG/100ML IV SOLN
1500.0000 mg | INTRAVENOUS | Status: DC
  Administered 2020-04-26: 1500 mg via INTRAVENOUS
  Filled 2020-04-26 (×2): qty 100

## 2020-04-26 MED ORDER — DEXTROSE-NACL 5-0.9 % IV SOLN: INTRAVENOUS | Status: AC

## 2020-04-26 NOTE — Care Management (Signed)
Voicemail left for Lasha Hickman and Lady Gary with DSS.  Also attempted to call Brooke Dare for dss, voicemail was full.  Awaiting return call to discuss discharge disposition.   RNCM spoke with Kenney Houseman at [facility] and confirmed patient could return at discharge.  He is currently there for STR

## 2020-04-26 NOTE — Progress Notes (Signed)
Physical Therapy Treatment Patient Details Name: Raymond [name] MRN: [id number] DOB: 10-27-1960 Today's Date: 04/26/2020    History of Present Illness Raymond [name]  is a 59 y.o. Caucasian male coming from [facility] with a known history of seizure disorder, mental retardation, obsessive-compulsive disorder, dyslipidemia and severe mitral regurgitation and CHF, who presented to the emergency room with acute onset of altered mental status.  He was found on the floor at his SNF.  No reported dysuria, oliguria or hematuria or flank pain.  No reported cough or wheezing or dyspnea.  No reported chest pain or palpitations.  The patient is a very poor historian due to his altered mental status as well as underlying developmental delay.    PT Comments    Pt in fowler's position on arrival to room with caregiver bedside and MD finishing up. Pt is agreeable to do some exercises in bed. He is very lethargic throughout but able to engage some. Pt's gown and blankets soiled so performed rolling with nursing staff to perform pericare. Patient was maxA+2 with rolling to both sides. Pt left in bed with all needs, including a warm blanket, and caregiver bedside. Pt will benefit from skilled PT in order to address deficits in strength and mobility in order to return to PLOF with minimal difficulties.    Follow Up Recommendations  SNF     Equipment Recommendations  None recommended by PT    Recommendations for Other Services       Precautions / Restrictions Precautions Precautions: Fall Restrictions Weight Bearing Restrictions: No    Mobility  Bed Mobility Overal bed mobility: Needs Assistance Bed Mobility: Rolling Rolling: Max assist;+2 for physical assistance         General bed mobility comments: Performed rolling for pericare  Transfers                 General transfer comment: Did not attempt today  Ambulation/Gait             General Gait Details: Did not  attempt   Stairs             Wheelchair Mobility    Modified Rankin (Stroke Patients Only)       Balance Overall balance assessment: Needs assistance     Sitting balance - Comments: Did not attempt       Standing balance comment: Did not attempt                            Cognition Arousal/Alertness: Lethargic Behavior During Therapy: Flat affect Overall Cognitive Status: History of cognitive impairments - at baseline                                 General Comments: Patient's caregiver at bedside      Exercises General Exercises - Upper Extremity Elbow Flexion: Strengthening;Both;10 reps;Supine (light manual resistance) Elbow Extension: Strengthening;Both;10 reps;Supine (light manual resistance) General Exercises - Lower Extremity Ankle Circles/Pumps: AROM;Both;10 reps;Supine Quad Sets: AROM;Both;5 reps;Supine Heel Slides: AAROM;Both;10 reps;Supine Other Exercises Other Exercises: Pt performed 10 shoulder shrugs Other Exercises: Pt performed rolling to both sides to perform pericare with help of nurse assistant    General Comments        Pertinent Vitals/Pain Pain Assessment: No/denies pain    Home Living  Prior Function            PT Goals (current goals can now be found in the care plan section) Acute Rehab PT Goals Patient Stated Goal: "to go home" PT Goal Formulation: Patient unable to participate in goal setting Time For Goal Achievement: 05/07/20 Potential to Achieve Goals: Fair Progress towards PT goals: Progressing toward goals    Frequency    Min 2X/week      PT Plan Current plan remains appropriate    Co-evaluation              AM-PAC PT "6 Clicks" Mobility   Outcome Measure  Help needed turning from your back to your side while in a flat bed without using bedrails?: Total Help needed moving from lying on your back to sitting on the side of a flat bed without  using bedrails?: Total Help needed moving to and from a bed to a chair (including a wheelchair)?: Total Help needed standing up from a chair using your arms (e.g., wheelchair or bedside chair)?: Total Help needed to walk in hospital room?: Total Help needed climbing 3-5 steps with a railing? : Total 6 Click Score: 6    End of Session   Activity Tolerance: Patient limited by lethargy Patient left: in bed;with call bell/phone within reach;with chair alarm set;with family/visitor present Nurse Communication: Mobility status PT Visit Diagnosis: Muscle weakness (generalized) (M62.81);Difficulty in walking, not elsewhere classified (R26.2)     Time: 6599-3570 PT Time Calculation (min) (ACUTE ONLY): 33 min  Charges:                         Katherine Basset, SPT Baker Pierini 04/26/2020, 12:47 PM

## 2020-04-26 NOTE — Progress Notes (Signed)
PROGRESS NOTE    Raymond [name]  GLO:[id number] DOB: [birth year] DOA: 04/22/2020 PCP: [name]   Brief Narrative:  HPI per Dr. Valente David on 04/22/20 Raymond [name]  is a 59 y.o. Caucasian male coming from [facility] with a known history of seizure disorder, mental retardation, obsessive-compulsive disorder, dyslipidemia and severe mitral regurgitation and CHF, who presented to the emergency room with acute onset of altered mental status.  He was found on the floor at his SNF.  No reported dysuria, oliguria or hematuria or flank pain.  No reported cough or wheezing or dyspnea.  No reported chest pain or palpitations.  The patient is a very poor historian due to his altered mental status as well as underlying developmental delay.  Upon presentation to emergency room, blood pressure was 88/61 with otherwise normal vital signs.  It has come up to 102/75 with hydration and later on was down to 88/62.  Labs revealed mild hyponatremia 132 and albumin of 2.9 with total protein of 6.1, total bili of 1.3.  Ammonia level was elevated at 52.  High-sensitivity troponin was 10 and lactic acid 1.  CBC showed mild anemia and TSH came back 1.8.  Respiratory panel including COVID-19 PCR came back negative.  Urinalysis was unremarkable.  Urine drug screen was positive for MDMA.  He had head and neck CT scan revealing mild bilateral intracavernous ICA narrowing secondary to atherosclerotic disease with no large vessel occlusion or high-grade narrowing or dissection or aneurysm.  KG showed atrial fibrillation with controlled ventricular response of 97 with borderline intraventricular conduction delay and T wave inversion laterally.  The patient was given 1 L bolus of IV lactated Ringer and 1 g of IV Keppra.  He will be admitted to an observation medically monitored bed for further evaluation and management.  **Interim History Patient remained tachycardic today.  He did not answer questioning  given his current condition.  Unable to get in touch with his legal guardian.  Ammonia level is trending down we will continue IV fluid hydration but reduce the rate given his history of CHF  04/24/2020 IV fluids have stopped and he was little bit more interactive today but pulled out both her IVs and nursing states that his urine output has not been very much so we will start him back on IV fluids.  Unclear about what his baseline is and attempted to reach his legal guardian without success.  04/25/2020 Is a little somnolent and drowsy this morning and blood pressure was low so he is given a bolus.  His home medications were reviewed again and some of them around they have been adjusted.  We will resume his home Keppra at 1500 mg p.o. nightly and continue with as milligrams p.o. daily.  We will also resume his home midodrine given his hypotension.  04/26/2020 Patient's caregiver at [facility] came to visit today and patient is much more awake. I spoke with her and at length and at baseline patient is able to perform his ADLs and feed himself and he is very far from baseline as he used to get on and off the transportation [place] by himself.  SLP evaluated today and he was unsafe to eat and they made him strict n.p.o. so we will change all the medications that we can to IV.  We will get palliative care for further goals of care discussion and consider a Neurology COnsult  Assessment & Plan:   Active Problems:   AMS (altered mental status)  Encephalopathy acute  Acute Metabolic Encephalopathy  -Initially thought to be hepatic encephalopathy but he has no evidence of liver cirrhosis and his ammonia level is likely elevated in the setting of his Depakote; ammonia level is 82 today -This is likely worsened by volume depletion and dehydration. -CTA Head and Cervical Spine showed "No acute intracranial process. Moderate cerebral atrophy and chronic microvascular ischemic changes. Mild bilateral  intracavernous ICA narrowing secondary to atherosclerotic disease. No large vessel occlusion, high-grade narrowing, dissection or Aneurysm." -The patient will be admitted to an observation medical monitored bed. -He will be placed on p.o. lactulose but now NPO will give Lactulose Enemas. -He was somnolent and Lethargic this AM but woke up later on this afternoon for Nursing  -Ammonia level went from 52 -> 39 -> 57 -> 67 -> 82 -We will follow neuro checks every 4 hours for 24 hours -We will follow daily ammonia level was slightly elevated -Place on delirium precautions -We will obtain an MRI and consider neuro consultation once it is done and we will also do an EEG -SLP evaluated and recommending dysphagia 1 diet with nectar thick liquids and aspiration precautions -UDS was MDMA was positive but ? If this was a false positive cardiac medications and can be false positive with trazodone and fenofibrate both of which patient is on; will ask lab to re-run the sample. -Continue monitor and trend carefully -Palliative Care Consulted for further evaluation and Recc's -PT/OT recommending SNF -SLP evaluated today and recommending Strict NPO; will consider tube feeding for nutrition and medications and if he does go on this will start Osmolite 1.235 mL/h and advance by 10 mL every 4 hours avoid 65 MLS per hour we will have palliative care discussion prior to this  Hypotension. -This could be likely related to volume depletion and dehydration. -He was given normal saline IV fluids and then this was changed and stopped but have resumed today given diminished urine output -See below -He was given a dose of IV albumin 25 g -IVF resumed at D5 NS at 50 mL/hr given his NPO status  -Blood pressure this afternoon is 102/66 -We will resume home midodrine  Hyponatremia with borderline magnesium level.,  Hyponatremia is improved -This is mild and is likely related to volume depletion. -As mentioned above he  will be hydrated with IV normal saline and will follow his BMP. -We will optimize his magnesium. -Na+ is slowly improving and went from 132 -> 134 -> 137 -> 140 -> 140 -Continue to monitor and trend repeat CMP in a.m.  Seizure Disorder. -When checking Keppra and Depakote levels.  -He was given IV Keppra in the ER and this will be continued at 1000 mg every 12h for 3 doses and change back to his home dose in the a.m.; he takes levetiracetam 1000 g p.o. daily and after further review he takes 1500 mg p.o. nightly and does not actually take carbamazepine -Patient no longer takes carbamazepine 200 g p.o. 3 times daily and this had not been stopped but will continue his divalproex 500 mg p.o. 3 times daily as above;  -**SINCE patient is NPO will hold off oral Antiepileptics and change to IV with assistance of Pharmacy -Valproic acid level was 77 -He will will be placed on seizure precautions. -May need to discuss with Neurology but first we will obtain an EEG and an MRI  History of Hypertension. -His Coreg and Cozaar will be held off given his current hypotension. -Continue to monitor blood pressures per  protocol -Last blood pressure reading was 102/66  and will resume his home midodrine  Paroxysmal atrial fibrillation.   -He has been occasionally with rapid ventricular response  -Continued Amiodarone 200 mg po Daily, Digoxin 0.0625 mg but currently are being held given his n.p.o. status; Digoxin Level was 0.2 and low -C/w ASA 81 mg po Daily -PT/INR was 15.4/1.3 Respectively  -Resume carvedilol 3.125 mg p.o. twice daily since his BP is a little better but will continue to hold now that he is strict n.p.o. -He is apparently a fall risk and therefore is not on anticoagulation. -We will optimize his magnesium level as mentioned above. -C/w Telemetry Monitoring  Dyslipidemia. -Fenofibrate 160 mg po Daily chronic now will be held due to his n.p.o. status  Intellectual Disability/Mental  Retardation with behavioral changes. -We will continue as needed Risperdal 0.5 mg po Daily and 1 mg qHS but likely will be held due to his n.p.o. status  GERD. -PPI therapy with Pantoprazole 40 mg p.o. daily changed to IV 40 mg daily and will need to change famotidine to IV as well  Normocytic Anemia -Patient's hemoglobin/hematocrit is stable at 10.5/3.1 -Check anemia panel and showed an iron level of 42, U IBC 185, TIBC 227, saturation ratios of 19%, ferritin of 357, folate level 25.0, and vitamin B12 level of 1830 today -Continue to monitor for signs and symptoms of bleeding; currently no overt bleeding noted -Repeat CBC in a.m.  Hypomagnesemia -Patient magnesium level is now 2.0 -Replete with IV mag sulfate 2 g yesterday -Continue monitor and replete as necessary  -Repeat CMP in a.m.  Thrombocytopenia -Mild with a platelet count of 123 and trended down to 109 yesterday but today it is improved to 148 -Continue to monitor for signs and symptoms of bleeding; currently no overt bleeding noted  -repeat CBC in AM  Severe Mitral Regurgitation -Recent echo done showed moderate to severe mitral valve regurgitation -Cautious IV fluid resuscitation  Chronic Systolic CHF -Currently getting IV fluid rehydration now given the rest strict n.p.o. status -continue to monitor for signs and symptoms of volume overload -Recent echocardiogram done showed an EF of 40 to 45% with normal left ventricular diastolic parameters -Given a dose of normal saline foreign levels at 125 an hour -Repeat Chest x-ray in the a.m.  GOC: DNR, poA -Have consulted palliative care for goals of care discussion  DVT prophylaxis: Enoxaparin 40 mg sq q24h Code Status: DO NOT RESUSCITATE Family Communication: No family present at bedside  Disposition Plan: Pending further improvement back to his baseline  Status is: Inpatient  Remains inpatient appropriate because:Unsafe d/c plan, IV treatments appropriate due to  intensity of illness or inability to take PO and Inpatient level of care appropriate due to severity of illness   Dispo: The patient is from: SNF              Anticipated d/c is to: SNF              Anticipated d/c date is: 1-2 days              Patient currently is not medically stable to d/c.  Consultants:   Palliative care medicine   Procedures: EEG has been ordered  Antimicrobials:  Anti-infectives (From admission, onward)   None        Subjective: Seen and examined at bedside and he was much more awake today as he recognized his caregiver and his normal facility and a language.  She states that he is  very far from his baseline and has become a extremely weak and significantly worsened since August 1 of this year and has had a steady decline.  Normally she states that he is independent of his ADLs and able to feed himself and get on and off the transportation vehicle and he is unable to do this.  Objective: Vitals:   04/25/20 1308 04/25/20 2126 04/26/20 0430 04/26/20 1251  BP: 97/67 93/64 102/79 102/66  Pulse: 83 90 (!) 104 (!) 105  Resp:  Temp:  (!) 97.5 F (36.4 C) 98.4 F (36.9 C) 98.4 F (36.9 C)  TempSrc:  Oral Oral Oral  SpO2: 98% 97% 99% 97%  Weight:      Height:        Intake/Output Summary (Last 24 hours) at 04/26/2020 1621 Last data filed at 04/26/2020 7425 Gross per 24 hour  Intake 941.18 ml  Output 400 ml  Net 541.18 ml   Filed Weights   04/22/20 1549  Weight: 58 kg   Examination: Physical Exam:  Constitutional: The patient is a thin chronically ill-appearing Caucasian male whose more awake and alert today and does answer questions yes and no.  Caregiver is at bedside and she tells me that he is very far from his baseline Eyes: Lids and conjunctivae normal, sclerae anicteric  ENMT: External Ears, Nose appear normal. Grossly normal hearing.  Neck: Appears normal, supple, no cervical masses, normal ROM, no appreciable thyromegaly; no  JVD Respiratory: Diminished to auscultation bilaterally, no wheezing, rales, rhonchi or crackles. Normal respiratory effort and patient is not tachypenic. No accessory muscle use.  Unlabored breathing Cardiovascular: Irregularly irregular and mildly tachycardic; no appreciable edema noted Abdomen: Soft, non-tender, non-distended. Bowel sounds positive.  GU: Deferred. Musculoskeletal: No clubbing / cyanosis of digits/nails. No joint deformity upper and lower extremities.  Skin: No rashes, lesions, ulcers on limited skin evaluation. No induration; Warm and dry.  Neurologic: CN 2-12 grossly intact with no focal deficits. Romberg sign and cerebellar reflexes not assessed.  Psychiatric: Impaired judgment and insight.  He is awake and alert but not fully oriented x 3. Normal mood and appropriate affect.   Data Reviewed: I have personally reviewed following labs and imaging studies  CBC: Recent Labs  Lab 04/22/20 1605 04/23/20 0432 04/24/20 0333 04/25/20 0506 04/26/20 0533  WBC 8.5 8.6 5.7 5.4 5.1  NEUTROABS 5.6  --  3.1 2.4 2.1  HGB 11.4* 10.8* 10.5* 9.7* 10.5*  HCT 34.0* 32.9* 30.4* 30.1* 30.1*  MCV 95.2 97.1 93.8 98.4 93.5  PLT 123* 110* 109* 137* 148*   Basic Metabolic Panel: Recent Labs  Lab 04/22/20 1605 04/23/20 0432 04/24/20 0333 04/25/20 0506 04/26/20 0533  NA 132* 134* 137 140 140  K 4.0 3.9 3.7 3.8 3.6  CL 98 104 106 108 106  CO2 GLUCOSE 111* 88 110* 92 113*  BUN CREATININE 0.94 0.78 0.65 0.78 0.85  CALCIUM 8.7* 8.2* 8.6* 8.6* 8.7*  MG 1.7  --  1.9 1.6* 2.0  PHOS  --   --  3.3 3.7 3.6   GFR: Estimated Creatinine Clearance: 78.7 mL/min (by C-G formula based on SCr of 0.85 mg/dL). Liver Function Tests: Recent Labs  Lab 04/22/20 1605 04/23/20 0432 04/24/20 0333 04/25/20 0506 04/26/20 0533  AST 33 ALT ALKPHOS 27* 29* 29* 24* 30*  BILITOT 1.3* 1.2 1.0 0.9 0.7  PROT 6.1* 5.6* 5.8* 5.5* 5.6*  ALBUMIN  2.9* 2.7* 2.7* 2.6* 2.6*   No results for input(s): LIPASE, AMYLASE in the last 168 hours. Recent Labs  Lab 04/22/20 1604 04/23/20 0432 04/24/20 0333 04/25/20 0506 04/26/20 0533  AMMONIA 52* 39* 57* 67* 82*   Coagulation Profile: Recent Labs  Lab 04/22/20 2034  INR 1.3*   Cardiac Enzymes: No results for input(s): CKTOTAL, CKMB, CKMBINDEX, TROPONINI in the last 168 hours. BNP (last 3 results) No results for input(s): PROBNP in the last 8760 hours. HbA1C: No results for input(s): HGBA1C in the last 72 hours. CBG: No results for input(s): GLUCAP in the last 168 hours. Lipid Profile: No results for input(s): CHOL, HDL, LDLCALC, TRIG, CHOLHDL, LDLDIRECT in the last 72 hours. Thyroid Function Tests: Recent Labs    04/24/20 0333  TSH 5.490*  FREET4 1.25*   Anemia Panel: Recent Labs    04/24/20 0333 04/25/20 0506  VITAMINB12 1,936* 1,830*  FOLATE  --  25.0  FERRITIN  --  357*  TIBC  --  227*  IRON  --  42*  RETICCTPCT  --  1.9   Sepsis Labs: Recent Labs  Lab 04/22/20 1604  LATICACIDVEN 1.0    Recent Results (from the past 240 hour(s))  Respiratory Panel by RT PCR (Flu A&B, Covid) - Nasopharyngeal Swab     Status: None   Collection Time: 04/22/20  4:04 PM   Specimen: Nasopharyngeal Swab  Result Value Ref Range Status   SARS Coronavirus 2 by RT PCR NEGATIVE NEGATIVE Final    Comment: (NOTE) SARS-CoV-2 target nucleic acids are NOT DETECTED.  The SARS-CoV-2 RNA is generally detectable in upper respiratoy specimens during the acute phase of infection. The lowest concentration of SARS-CoV-2 viral copies this assay can detect is 131 copies/mL. A negative result does not preclude SARS-Cov-2 infection and should not be used as the sole basis for treatment or other patient management decisions. A negative result may occur with  improper specimen collection/handling, submission of specimen other than nasopharyngeal swab, presence of viral mutation(s) within  the areas targeted by this assay, and inadequate number of viral copies (<131 copies/mL). A negative result must be combined with clinical observations, patient history, and epidemiological information. The expected result is Negative.  Fact Sheet for Patients:  https://www.moore.com/  Fact Sheet for Healthcare Providers:  https://www.young.biz/  This test is no t yet approved or cleared by the [place] FDA and  has been authorized for detection and/or diagnosis of SARS-CoV-2 by FDA under an Emergency Use Authorization (EUA). This EUA will remain  in effect (meaning this test can be used) for the duration of the COVID-19 declaration under Section 564(b)(1) of the Act, 21 U.S.C. section 360bbb-3(b)(1), unless the authorization is terminated or revoked sooner.     Influenza A by PCR NEGATIVE NEGATIVE Final   Influenza B by PCR NEGATIVE NEGATIVE Final    Comment: (NOTE) The Xpert Xpress SARS-CoV-2/FLU/RSV assay is intended as an aid in  the diagnosis of influenza from Nasopharyngeal swab specimens and  should not be used as a sole basis for treatment. Nasal washings and  aspirates are unacceptable for Xpert Xpress SARS-CoV-2/FLU/RSV  testing.  Fact Sheet for Patients: https://www.moore.com/  Fact Sheet for Healthcare Providers: https://www.young.biz/  This test is not yet approved or cleared by the [place] FDA and  has been authorized for detection and/or diagnosis of SARS-CoV-2 by  FDA under an Emergency Use Authorization (EUA). This EUA will remain  in effect (meaning  this test can be used) for the duration of the  Covid-19 declaration under Section 564(b)(1) of the Act, 21  U.S.C. section 360bbb-3(b)(1), unless the authorization is  terminated or revoked. Performed at [facility], [address] [place], [place] [address]      RN Pressure Injury Documentation:      Estimated body mass index is 17.34 kg/m as calculated from the following:   Height as of this encounter: 6' (1.829 m).   Weight as of this encounter: 58 kg.  Malnutrition Type:  Nutrition Problem: Inadequate oral intake Etiology: dysphagia   Malnutrition Characteristics:  Signs/Symptoms: NPO status   Nutrition Interventions:  Interventions: Other (Comment)   Radiology Studies: DG Chest 1 View  Result Date: 04/25/2020 CLINICAL DATA:  Confusion EXAM: CHEST  1 VIEW COMPARISON:  04/22/2020, CT chest 03/24/2020, chest radiograph 03/23/2020 FINDINGS: Moderate cardiomegaly with mild central vascular congestion. No pleural effusion. Possible patchy right infrahilar opacity. No pneumothorax. IMPRESSION: Cardiomegaly with mild central vascular congestion. Patchy right infrahilar opacity may reflect atelectasis versus small pneumonia. Electronically Signed   By: Jasmine Pang M.D.   On: 04/25/2020 17:17   Scheduled Meds:  amiodarone  200 mg Oral Daily   aspirin EC  81 mg Oral Daily   benztropine  0.5 mg Oral BID   carvedilol  3.125 mg Oral BID WC   digoxin  0.0625 mg Oral Daily   divalproex  500 mg Oral TID WC   enoxaparin (LOVENOX) injection  40 mg Subcutaneous Q24H   famotidine  40 mg Oral QHS   fenofibrate  160 mg Oral Daily   fluticasone  2 spray Each Nare Daily   glycopyrrolate  1 mg Oral BID   iron polysaccharides  150 mg Oral Daily   lactulose  20 g Oral TID   levETIRAcetam  1,000 mg Oral Daily   levETIRAcetam  1,500 mg Oral QHS   loratadine  10 mg Oral Daily   mouth rinse  15 mL Mouth Rinse BID   midodrine  10 mg Oral TID WC   multivitamin with minerals  1 tablet Oral Daily   pantoprazole  40 mg Oral Daily   psyllium  1 packet Oral Daily   risperiDONE  0.5 mg Oral Daily   risperiDONE  1 mg Oral QHS   vitamin B-12  1,000 mcg Oral Daily   Continuous Infusions:  sodium chloride Stopped (04/25/20 1535)    LOS: 3 days   Merlene Laughter,  DO [facility] PAGER is on AMION  If 7PM-7AM, please contact night-coverage [email or web address]
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
## 2020-04-26 NOTE — Care Management Important Message (Signed)
Important Message  Patient Details  Name: Raymond [name] MRN: [id number] Date of Birth: April 03, 1961   Medicare Important Message Given:  Yes  Reviewed with legal guardian, [name].  Copy of Medicare IM faxed to her attention at [phone].   [name] 04/26/2020, 11:34 AM

## 2020-04-26 NOTE — Progress Notes (Signed)
Initial Nutrition Assessment  DOCUMENTATION CODES:   Underweight  INTERVENTION:  Monitor for goals of care, if appropriate consider placing NG tube for nutrition and medications -Osmolite 1.2 @ 35 ml/hr, advance 10 ml every 4 hrs to goal rate 65 ml/hr (1560 ml daily)  Regimen will provide 1872 kcal, 87 grams protein, and 1279 ml free water   NUTRITION DIAGNOSIS:   Inadequate oral intake related to dysphagia as evidenced by NPO status.    GOAL:   Patient will meet greater than or equal to 90% of their needs    MONITOR:   Weight trends, Labs, I & O's, Diet advancement  REASON FOR ASSESSMENT:   Other (Comment) (low BMI)    ASSESSMENT:  59 year old male with history of seizure disorder, intellectual disability, obsessive-compulsive disorder, HLD, severe mitral regurgitation, CHF presented from SNF with after being found on the floor with acute onset of altered mental status.  Pt admitted with acute metabolic encephalopathy.  Patient somnolent this afternoon at RD visit. Pt familiar to nutrition department from previous admissions. Per notes, pt is nonverbal at baseline, was eating an oral diet and helping to feed self at SNF. Reported as typically having a good appetite, however is particular about foods that he will eat. BSE completed yesterday, ordered dysphagia 1 with nectar thick liquids with full feeding support. Per flowsheets, he consumed 100% of breakfast meal today. Pt now NPO, noted declined swallowing function from yesterday due to decreased oropharyngeal sensation and awareness. Per chart, he is a ward of the state for the last ~16 years  Per chart, weights have trended down 22 lbs (14.74%) over the last 13 months; insignificant for time frame. Pt is underweight and given chronic sCHF, sever mitral regurgitation, as well as intellectual disabilities he is at high risk for malnutrition, recommend consideration of NG tube for nutrition and medications given NPO.      Medications reviewed and include: Depakote, Pepcid, Fenofibrate, Keppra, B12, MVI, Risperdal  Labs: reviewed  NUTRITION - FOCUSED PHYSICAL EXAM: Limited exam performed d/t pt somnolent Findings: Moderate orbital fat depletion; severe buccal fat depletion; moderate temporal muscle; clavicle bone; clavicle and acromion bone region   Diet Order:   Diet Order            Diet NPO time specified  Diet effective now                 EDUCATION NEEDS:   No education needs have been identified at this time  Skin:  Skin Assessment: Reviewed RN Assessment  Last BM:  9/28  Height:   Ht Readings from Last 1 Encounters:  04/22/20 6' (1.829 m)    Weight:   Wt Readings from Last 1 Encounters:  04/22/20 58 kg    Ideal Body Weight:  80.9 kg  BMI:  Body mass index is 17.34 kg/m.  Estimated Nutritional Needs:   Kcal:  1850-2050  Protein:  87-99  Fluid:  1.8 L/day   Lars Masson, RD, LDN Clinical Nutrition After Hours/Weekend Pager # in Amion

## 2020-04-27 ENCOUNTER — Inpatient Hospital Stay: Payer: Medicare Other

## 2020-04-27 DIAGNOSIS — E7220 Disorder of urea cycle metabolism, unspecified: Principal | ICD-10-CM

## 2020-04-27 DIAGNOSIS — Z66 Do not resuscitate: Secondary | ICD-10-CM | POA: Diagnosis not present

## 2020-04-27 DIAGNOSIS — G9340 Encephalopathy, unspecified: Secondary | ICD-10-CM | POA: Diagnosis not present

## 2020-04-27 DIAGNOSIS — R4182 Altered mental status, unspecified: Secondary | ICD-10-CM | POA: Diagnosis not present

## 2020-04-27 DIAGNOSIS — Z7189 Other specified counseling: Secondary | ICD-10-CM | POA: Diagnosis not present

## 2020-04-27 DIAGNOSIS — Z515 Encounter for palliative care: Secondary | ICD-10-CM | POA: Diagnosis not present

## 2020-04-27 LAB — AMMONIA: Ammonia: 95 umol/L — ABNORMAL HIGH (ref 9–35)

## 2020-04-27 LAB — LEVETIRACETAM LEVEL: Levetiracetam Lvl: 6.7 ug/mL — ABNORMAL LOW (ref 10.0–40.0)

## 2020-04-27 MED ORDER — SODIUM CHLORIDE 0.9 % IV SOLN
200.0000 mg | Freq: Once | INTRAVENOUS | Status: AC
  Administered 2020-04-27: 200 mg via INTRAVENOUS
  Filled 2020-04-27: qty 20

## 2020-04-27 MED ORDER — SODIUM CHLORIDE 0.9 % IV SOLN
100.0000 mg | Freq: Two times a day (BID) | INTRAVENOUS | Status: DC
  Administered 2020-04-28 – 2020-05-03 (×11): 100 mg via INTRAVENOUS
  Filled 2020-04-27 (×12): qty 10

## 2020-04-27 MED ORDER — LORAZEPAM 2 MG/ML IJ SOLN
1.0000 mg | Freq: Once | INTRAMUSCULAR | Status: AC
  Administered 2020-04-27: 1 mg via INTRAVENOUS
  Filled 2020-04-27: qty 1

## 2020-04-27 MED ORDER — SODIUM CHLORIDE 0.9 % IV SOLN: INTRAVENOUS | Status: AC

## 2020-04-27 NOTE — Progress Notes (Signed)
SLP Cancellation Note  Patient Details Name: Raymond [name] MRN: [id number] DOB: Sep 07, 1960   Cancelled treatment:       Reason Eval/Treat Not Completed: Fatigue/lethargy limiting ability to participate;Patient's level of consciousness;Patient not medically ready (chart reviewed; attempted to see pt at bedside). Pt is not safe for oral intake in current presentation. ST services will continue to f/u daily w/ pt's status. NSG updated.     Jerilynn Som, MS, CCC-SLP Speech Language Pathologist Rehab Services [phone] [facility] 04/27/2020, 1:10 PM

## 2020-04-27 NOTE — TOC Progression Note (Signed)
Transition of Care [facility]) - Progression Note    Patient Details  Name: Raymond [name] MRN: [id number] Date of Birth: 02-04-1961  [facility] CM/SW Contact  Chapman Fitch, RN Phone Number: 04/27/2020, 3:47 PM  Clinical Narrative:     Received return call from [facility] with DSS on 9/28.  She confirms the plan is for patient to return to [facility] when medically cleared        Expected Discharge Plan and Services                                                 Social Determinants of Health (SDOH) Interventions    Readmission Risk Interventions No flowsheet data found.

## 2020-04-27 NOTE — Progress Notes (Addendum)
PROGRESS NOTE    Raymond [name]  ZDG:[id number] DOB: 05/17/1961 DOA: 04/22/2020 PCP: [name]   Brief Narrative: Raymond [name] is a 59 y.o. male coming from [facility] with a known history of seizure disorder, mental retardation, obsessive-compulsive disorder, dyslipidemia and severe mitral regurgitation and CHF. Patient presented secondary to acute altered mental status. Likely related to hyperammonemia in setting of Depakote use.   Assessment & Plan:   Active Problems:   AMS (altered mental status)   Encephalopathy acute   Acute metabolic encephalopathy In setting of hyperammoniemia. Imaging has ruled out hepatic source. Likely mediation related in setting of Depakote use. Ammonia continues to rise. CTA head/MRI brain significant for no acute intracranial process and evidence of moderate cerebral atroph/chronic ischemic changes. UDS significant for MDMA possibly falsely positive secondary to medications. -Discontinue Depakote -Discussed with neurology with recommendations for Vimpat 200 mg load followed by Vimpat 100 mg q12 hours -Keppra held and will continue to hold; discussed with neurology -SLP recommending NPO -NG tube placement today for medication administration purposes -Palliative care recommendations  Hyperammonemia No cirrhosis noted on RUQ ultrasound obtained today -Continue Lactulose enemas as able, transition to lactulose per tube once able to place NG tube  Essential hypertension Hypotension while inpatient. Currently controlled. On Coreg and Cozaar which were held.  Hyponatremia Mild. Resolved.  Seizure disorder Followed by neurology as an outpatient -Discontinue Depakote and Keppra -Start Vimpat 200 mg bolus followed by Vimpat 100 mg q12 hours per neurology  Paroxysmal atrial fibrillation Rate controlled. On amiodarone and Coreg as an outpatient which is unable to be given today secondary to mental  status.  Hyperlipidemia On fenofibrate as an outpatient.  Hypomagnesemia Repletion given.  Thrombocytopenia Mild. Unsure of etiology. No evidence of bleeding.  Mitral regurgitation Severe on Transthoracic Echocardiogram  Chronic systolic heart failure Stable.  DVT prophylaxis: Lovenox Code Status:   Code Status: DNR Family Communication: None at bedside Disposition Plan: Discharge likely to SNF if mental status improves; may be hospice appropriate if unable to reverse mental status change. Anticipate discharge in several days.   Consultants:   Palliative care medicine  Procedures:   None  Antimicrobials:  None    Subjective: Unable to give history secondary to mental status  Objective: Vitals:   04/26/20 0430 04/26/20 1251 04/26/20 2015 04/27/20 0415  BP: 102/79 102/66 98/67 99/71   Pulse: (!) 104 (!) 105 85 78  Resp: 18 16 20 20   Temp: 98.4 F (36.9 C) 98.4 F (36.9 C) 98 F (36.7 C) 97.6 F (36.4 C)  TempSrc: Oral Oral Oral Oral  SpO2: 99% 97% 95% 97%  Weight:      Height:        Intake/Output Summary (Last 24 hours) at 04/27/2020 0909 Last data filed at 04/27/2020 0523 Gross per 24 hour  Intake 893.64 ml  Output 400 ml  Net 493.64 ml   Filed Weights   04/22/20 1549  Weight: 58 kg    Examination:  General exam: Appears calm and comfortable. Chronically ill appearing. Respiratory system: Clear to auscultation. Respiratory effort normal. Cardiovascular system: S1 & S2 heard, RRR. 2/6 systolic murmur Gastrointestinal system: Abdomen is nondistended, soft and nontender. No organomegaly or masses felt. Normal bowel sounds heard. Central nervous system: Lethargic but arouses to tactile stimulation. Follows simple commands. Musculoskeletal: No calf tenderness Skin: No cyanosis. No rashes Psychiatry: Judgement and insight appear impaired.    Data Reviewed: I have personally reviewed following labs and imaging studies  CBC  Lab Results   Component Value Date   WBC 5.1 04/26/2020   RBC 3.22 (L) 04/26/2020   HGB 10.5 (L) 04/26/2020   HCT 30.1 (L) 04/26/2020   MCV 93.5 04/26/2020   MCH 32.6 04/26/2020   PLT 148 (L) 04/26/2020   MCHC 34.9 04/26/2020   RDW 14.6 04/26/2020   LYMPHSABS 2.0 04/26/2020   MONOABS 0.6 04/26/2020   EOSABS 0.3 04/26/2020   BASOSABS 0.1 04/26/2020     Last metabolic panel Lab Results  Component Value Date   NA 140 04/26/2020   K 3.6 04/26/2020   CL 106 04/26/2020   CO2 24 04/26/2020   BUN 8 04/26/2020   CREATININE 0.85 04/26/2020   GLUCOSE 113 (H) 04/26/2020   GFRNONAA >60 04/26/2020   GFRAA >60 04/26/2020   CALCIUM 8.7 (L) 04/26/2020   PHOS 3.6 04/26/2020   PROT 5.6 (L) 04/26/2020   ALBUMIN 2.6 (L) 04/26/2020   LABGLOB 2.7 12/19/2017   AGRATIO 1.6 12/19/2017   BILITOT 0.7 04/26/2020   ALKPHOS 30 (L) 04/26/2020   AST 26 04/26/2020   ALT 13 04/26/2020   ANIONGAP 10 04/26/2020    CBG (last 3)  No results for input(s): GLUCAP in the last 72 hours.   GFR: Estimated Creatinine Clearance: 78.7 mL/min (by C-G formula based on SCr of 0.85 mg/dL).  Coagulation Profile: Recent Labs  Lab 04/22/20 2034  INR 1.3*    Recent Results (from the past 240 hour(s))  Respiratory Panel by RT PCR (Flu A&B, Covid) - Nasopharyngeal Swab     Status: None   Collection Time: 04/22/20  4:04 PM   Specimen: Nasopharyngeal Swab  Result Value Ref Range Status   SARS Coronavirus 2 by RT PCR NEGATIVE NEGATIVE Final    Comment: (NOTE) SARS-CoV-2 target nucleic acids are NOT DETECTED.  The SARS-CoV-2 RNA is generally detectable in upper respiratoy specimens during the acute phase of infection. The lowest concentration of SARS-CoV-2 viral copies this assay can detect is 131 copies/mL. A negative result does not preclude SARS-Cov-2 infection and should not be used as the sole basis for treatment or other patient management decisions. A negative result may occur with  improper specimen  collection/handling, submission of specimen other than nasopharyngeal swab, presence of viral mutation(s) within the areas targeted by this assay, and inadequate number of viral copies (<131 copies/mL). A negative result must be combined with clinical observations, patient history, and epidemiological information. The expected result is Negative.  Fact Sheet for Patients:  https://www.moore.com/  Fact Sheet for Healthcare Providers:  https://www.young.biz/  This test is no t yet approved or cleared by the [place] FDA and  has been authorized for detection and/or diagnosis of SARS-CoV-2 by FDA under an Emergency Use Authorization (EUA). This EUA will remain  in effect (meaning this test can be used) for the duration of the COVID-19 declaration under Section 564(b)(1) of the Act, 21 U.S.C. section 360bbb-3(b)(1), unless the authorization is terminated or revoked sooner.     Influenza A by PCR NEGATIVE NEGATIVE Final   Influenza B by PCR NEGATIVE NEGATIVE Final    Comment: (NOTE) The Xpert Xpress SARS-CoV-2/FLU/RSV assay is intended as an aid in  the diagnosis of influenza from Nasopharyngeal swab specimens and  should not be used as a sole basis for treatment. Nasal washings and  aspirates are unacceptable for Xpert Xpress SARS-CoV-2/FLU/RSV  testing.  Fact Sheet for Patients: https://www.moore.com/  Fact Sheet for Healthcare Providers: https://www.young.biz/  This test is not yet approved or cleared  by the [place] and  has been authorized for detection and/or diagnosis of SARS-CoV-2 by  FDA under an Emergency Use Authorization (EUA). This EUA will remain  in effect (meaning this test can be used) for the duration of the  Covid-19 declaration under Section 564(b)(1) of the Act, 21  U.S.C. section 360bbb-3(b)(1), unless the authorization is  terminated or revoked. Performed at [facility], [address] [place], [place] [address]         Radiology Studies: DG Chest 1 View  Result Date: 04/25/2020 CLINICAL DATA:  Confusion EXAM: CHEST  1 VIEW COMPARISON:  04/22/2020, CT chest 03/24/2020, chest radiograph 03/23/2020 FINDINGS: Moderate cardiomegaly with mild central vascular congestion. No pleural effusion. Possible patchy right infrahilar opacity. No pneumothorax. IMPRESSION: Cardiomegaly with mild central vascular congestion. Patchy right infrahilar opacity may reflect atelectasis versus small pneumonia. Electronically Signed   By: Jasmine Pang M.D.   On: 04/25/2020 17:17   MR BRAIN WO CONTRAST  Result Date: 04/27/2020 CLINICAL DATA:  Encephalopathy EXAM: MRI HEAD WITHOUT CONTRAST TECHNIQUE: Multiplanar, multiecho pulse sequences of the brain and surrounding structures were obtained without intravenous contrast. COMPARISON:  03/26/2020 FINDINGS: Brain: No acute infarct, acute hemorrhage or extra-axial collection. Multifocal hyperintense T2-weighted signal within the white matter. There is generalized atrophy without lobar predilection. No chronic microhemorrhage. Normal midline structures. Vascular: Normal flow voids. Skull and upper cervical spine: Normal marrow signal. Sinuses/Orbits: Negative. Other: None. IMPRESSION: 1. No acute intracranial abnormality. 2. Generalized atrophy and findings of chronic small vessel disease. Electronically Signed   By: Deatra Robinson M.D.   On: 04/27/2020 02:04        Scheduled Meds: . amiodarone  200 mg Oral Daily  . aspirin EC  81 mg Oral Daily  . benztropine  0.5 mg Oral BID  . carvedilol  3.125 mg Oral BID WC  . digoxin  0.0625 mg Oral Daily  . enoxaparin (LOVENOX) injection  40 mg Subcutaneous Q24H  . fenofibrate  160 mg Oral Daily  . fluticasone  2 spray Each Nare Daily  . glycopyrrolate  1 mg Oral BID  . iron polysaccharides  150 mg Oral Daily  . lactulose  300 mL Rectal Daily  . loratadine  10 mg Oral Daily   . mouth rinse  15 mL Mouth Rinse BID  . midodrine  10 mg Oral TID WC  . multivitamin with minerals  1 tablet Oral Daily  . pantoprazole (PROTONIX) IV  40 mg Intravenous Q24H  . psyllium  1 packet Oral Daily  . risperiDONE  0.5 mg Oral Daily  . risperiDONE  1 mg Oral QHS  . vitamin B-12  1,000 mcg Oral Daily   Continuous Infusions: . sodium chloride Stopped (04/25/20 1535)  . famotidine (PEPCID) IV Stopped (04/26/20 2254)  . levETIRAcetam    . levETIRAcetam Stopped (04/26/20 2314)  . valproate sodium 55 mL/hr at 04/27/20 0523     LOS: 4 days     Jacquelin Hawking, MD [facility] 04/27/2020, 9:09 AM  If 7PM-7AM, please contact night-coverage [email or web address]

## 2020-04-27 NOTE — Progress Notes (Signed)
[facility] Liaison note: Patient is currently followed by [facility] community Palliative program at home. TOC Stephanie Bowen made aware.  Dayna Barker BSN, RN, [facility] [phone]

## 2020-04-27 NOTE — Consult Note (Signed)
Consultation Note Date: 04/27/2020   Patient Name: Raymond [name]  DOB: Apr 10, 1961  MRN: [id number]  Age / Sex: 59 y.o., male  PCP: [name] Referring Physician: Narda Bonds, MD  Reason for Consultation: Establishing goals of care  HPI/Patient Profile: 59 y.o. male  with past medical history of seizure disorder, cognitive impairment with aphasia, OCD, HTN, HLD, a fib, severe mitral regurgitation, and CHF admitted on 04/22/2020 with AMS after being found on the floor at [facility]. AMS continues through hospitalization - now NPO d/t mental status. Ammonia elevated. PMT consulted for GOC.  Clinical Assessment and Goals of Care: I have reviewed medical records including EPIC notes, labs and imaging, received report from Dr. [name], and assessed the patient.  DSS social worker [name] is patient's Clinical research associate.   Previous palliative notes reviewed - patient seen several times in Aug and Sept of this year by both inpatient and outpatient palliative. DNR was established. Per notes, current condition is far from baseline. About a month and a half ago patient was ambulatory and lived in a group home. Since that time he has been in rehab facility as he could not return to group home d/t functional decline.   Discussed with Dr. [name] - planning for gastric tube in order to receive lactulose.   Patient is lethargic but will interact with persistent stimulation - he indicates he is not in pain or uncomfortable. Offered to swab his mouth, he declined. Goes back to sleep quickly.   PMT will follow along and allow time for outcomes. Unable to speak with legal guardian today. Will continue attempts to reach out as needed.   Primary Decision Maker LEGAL GUARDIAN    SUMMARY OF RECOMMENDATIONS   - time for outcomes following NG placement and lactulose administration - will continue attempts to speak with legal guardian -  palliative will follow  Code Status/Advance Care Planning:  DNR  Prognosis:   Unable to determine  Discharge Planning: To Be Determined      Primary Diagnoses: Present on Admission: . AMS (altered mental status) . Encephalopathy acute   I have reviewed the medical record, interviewed the patient and family, and examined the patient. The following aspects are pertinent.  Past Medical History:  Diagnosis Date  . Epilepsy (HCC)   . Epileptic seizures (HCC)   . Hyperlipidemia   . Mental retardation   . OCD (obsessive compulsive disorder)    water drinking  . OCD (obsessive compulsive disorder)   . Severe mitral regurgitation    a. 02/2016 Echo: EF 55-60%, no rwma, MVP of posterior leaflet w/ an anteriorly directed MR - severe. Sev dil LA; b. TTE 8/18: EF of 55-60%, normal wall motion, normal LV diastolic function parameters, severe mitral prolapse involving the anterior leaflet and posterior leaflet with severe regurgitation directed anteriorly, severely dilated left atrium, unable to estimate the PASP  . Systolic murmur    Social History   Socioeconomic History  . Marital status: Single    Spouse name: Not on file  . Number of children: Not on file  . Years of education: Not on file  . Highest education level: Not on file  Occupational History  . Not on file  Tobacco Use  . Smoking status: Never Smoker  . Smokeless tobacco: Never Used  Vaping Use  . Vaping Use: Never used  Substance and Sexual Activity  . Alcohol use: No    Alcohol/week: 0.0 standard drinks  . Drug use: No  .  Sexual activity: Never  Other Topics Concern  . Not on file  Social History Narrative   ** Merged History Encounter **       Social Determinants of Health   Financial Resource Strain:   . Difficulty of Paying Living Expenses: Not on file  Food Insecurity:   . Worried About Programme researcher, broadcasting/film/video in the Last Year: Not on file  . Ran Out of Food in the Last Year: Not on file    Transportation Needs:   . Lack of Transportation (Medical): Not on file  . Lack of Transportation (Non-Medical): Not on file  Physical Activity:   . Days of Exercise per Week: Not on file  . Minutes of Exercise per Session: Not on file  Stress:   . Feeling of Stress : Not on file  Social Connections:   . Frequency of Communication with Friends and Family: Not on file  . Frequency of Social Gatherings with Friends and Family: Not on file  . Attends Religious Services: Not on file  . Active Member of Clubs or Organizations: Not on file  . Attends Banker Meetings: Not on file  . Marital Status: Not on file   Family History  Family history unknown: Yes   Scheduled Meds: . amiodarone  200 mg Oral Daily  . aspirin EC  81 mg Oral Daily  . benztropine  0.5 mg Oral BID  . carvedilol  3.125 mg Oral BID WC  . digoxin  0.0625 mg Oral Daily  . enoxaparin (LOVENOX) injection  40 mg Subcutaneous Q24H  . fenofibrate  160 mg Oral Daily  . fluticasone  2 spray Each Nare Daily  . glycopyrrolate  1 mg Oral BID  . iron polysaccharides  150 mg Oral Daily  . lactulose  300 mL Rectal Daily  . loratadine  10 mg Oral Daily  . mouth rinse  15 mL Mouth Rinse BID  . midodrine  10 mg Oral TID WC  . multivitamin with minerals  1 tablet Oral Daily  . pantoprazole (PROTONIX) IV  40 mg Intravenous Q24H  . psyllium  1 packet Oral Daily  . risperiDONE  0.5 mg Oral Daily  . risperiDONE  1 mg Oral QHS  . vitamin B-12  1,000 mcg Oral Daily   Continuous Infusions: . sodium chloride Stopped (04/25/20 1535)  . famotidine (PEPCID) IV Stopped (04/26/20 2254)  . levETIRAcetam 1,000 mg (04/27/20 1149)  . levETIRAcetam Stopped (04/26/20 2314)  . valproate sodium 55 mL/hr at 04/27/20 0523   PRN Meds:.sodium chloride, acetaminophen **OR** acetaminophen, albuterol, magnesium hydroxide, meclizine, ondansetron **OR** ondansetron (ZOFRAN) IV, traZODone No Known Allergies Review of Systems  Unable to  perform ROS: Mental status change    Physical Exam Constitutional:      Comments: Pale, frail/cachectic, weak, lethargic  Pulmonary:     Effort: Pulmonary effort is normal.  Skin:    General: Skin is warm and dry.  Neurological:     Comments: UTA     Vital Signs: BP 99/71 (BP Location: Left Arm)   Pulse 78   Temp 97.6 F (36.4 C) (Oral)   Resp 20   Ht 6' (1.829 m)   Wt 58 kg   SpO2 97%   BMI 17.34 kg/m  Pain Scale: 0-10   Pain Score: 0-No pain   SpO2: SpO2: 97 % O2 Device:SpO2: 97 % O2 Flow Rate: .O2 Flow Rate (L/min): 2 L/min  IO: Intake/output summary:   Intake/Output Summary (Last 24 hours) at  04/27/2020 1218 Last data filed at 04/27/2020 0523 Gross per 24 hour  Intake 653.64 ml  Output 400 ml  Net 253.64 ml    LBM: Last BM Date: 04/26/20 Baseline Weight: Weight: 58 kg Most recent weight: Weight: 58 kg     Palliative Assessment/Data: PPS 10%    Time Total: 35 minutes Greater than 50%  of this time was spent counseling and coordinating care related to the above assessment and plan.  Gerlean Ren, DNP, AGNP-C Palliative Medicine Team [phone] Pager: [phone]

## 2020-04-28 ENCOUNTER — Inpatient Hospital Stay: Payer: Medicare Other

## 2020-04-28 DIAGNOSIS — G40909 Epilepsy, unspecified, not intractable, without status epilepticus: Secondary | ICD-10-CM | POA: Diagnosis not present

## 2020-04-28 DIAGNOSIS — G9340 Encephalopathy, unspecified: Secondary | ICD-10-CM | POA: Diagnosis not present

## 2020-04-28 DIAGNOSIS — Z66 Do not resuscitate: Secondary | ICD-10-CM | POA: Diagnosis not present

## 2020-04-28 DIAGNOSIS — E7220 Disorder of urea cycle metabolism, unspecified: Secondary | ICD-10-CM | POA: Diagnosis not present

## 2020-04-28 DIAGNOSIS — Z7189 Other specified counseling: Secondary | ICD-10-CM | POA: Diagnosis not present

## 2020-04-28 DIAGNOSIS — R4182 Altered mental status, unspecified: Secondary | ICD-10-CM | POA: Diagnosis not present

## 2020-04-28 DIAGNOSIS — Z515 Encounter for palliative care: Secondary | ICD-10-CM | POA: Diagnosis not present

## 2020-04-28 LAB — AMMONIA: Ammonia: 50 umol/L — ABNORMAL HIGH (ref 9–35)

## 2020-04-28 LAB — GLUCOSE, CAPILLARY: Glucose-Capillary: 98 mg/dL (ref 70–99)

## 2020-04-28 MED ORDER — OSMOLITE 1.2 CAL PO LIQD
1000.0000 mL | ORAL | Status: DC
  Administered 2020-04-28 – 2020-04-30 (×3): 1000 mL

## 2020-04-28 MED ORDER — RISPERIDONE 1 MG PO TABS
1.0000 mg | ORAL_TABLET | Freq: Every day | ORAL | Status: DC
  Administered 2020-04-28 – 2020-05-02 (×2): 1 mg
  Filled 2020-04-28 (×6): qty 1

## 2020-04-28 MED ORDER — ASPIRIN 81 MG PO CHEW
81.0000 mg | CHEWABLE_TABLET | Freq: Every day | ORAL | Status: DC
  Administered 2020-04-29 – 2020-05-03 (×2): 81 mg
  Filled 2020-04-28 (×2): qty 1

## 2020-04-28 MED ORDER — FENOFIBRATE 160 MG PO TABS
160.0000 mg | ORAL_TABLET | Freq: Every day | ORAL | Status: DC
  Administered 2020-04-29 – 2020-05-03 (×2): 160 mg
  Filled 2020-04-28 (×5): qty 1

## 2020-04-28 MED ORDER — LACTULOSE 10 GM/15ML PO SOLN
20.0000 g | Freq: Every day | ORAL | Status: DC
  Administered 2020-04-28: 20 g
  Filled 2020-04-28: qty 30

## 2020-04-28 MED ORDER — RISPERIDONE 0.5 MG PO TABS
0.5000 mg | ORAL_TABLET | Freq: Every day | ORAL | Status: DC
  Administered 2020-04-29 – 2020-05-03 (×2): 0.5 mg
  Filled 2020-04-28 (×5): qty 1

## 2020-04-28 MED ORDER — VITAMIN B-12 1000 MCG PO TABS
1000.0000 ug | ORAL_TABLET | Freq: Every day | ORAL | Status: DC
  Administered 2020-04-29 – 2020-05-03 (×2): 1000 ug
  Filled 2020-04-28 (×2): qty 1

## 2020-04-28 MED ORDER — GLYCOPYRROLATE 1 MG PO TABS
1.0000 mg | ORAL_TABLET | Freq: Two times a day (BID) | ORAL | Status: DC
  Administered 2020-04-28 – 2020-05-03 (×4): 1 mg
  Filled 2020-04-28 (×11): qty 1

## 2020-04-28 MED ORDER — LORATADINE 10 MG PO TABS
10.0000 mg | ORAL_TABLET | Freq: Every day | ORAL | Status: DC
  Administered 2020-04-29 – 2020-05-03 (×2): 10 mg
  Filled 2020-04-28 (×2): qty 1

## 2020-04-28 MED ORDER — MIDODRINE HCL 5 MG PO TABS
10.0000 mg | ORAL_TABLET | Freq: Three times a day (TID) | ORAL | Status: DC
  Administered 2020-04-28 – 2020-05-03 (×9): 10 mg
  Filled 2020-04-28 (×17): qty 2

## 2020-04-28 MED ORDER — CARVEDILOL 6.25 MG PO TABS
3.1250 mg | ORAL_TABLET | Freq: Two times a day (BID) | ORAL | Status: DC
  Administered 2020-04-28 – 2020-05-03 (×6): 3.125 mg
  Filled 2020-04-28 (×6): qty 1

## 2020-04-28 MED ORDER — FERROUS SULFATE 220 (44 FE) MG/5ML PO ELIX
220.0000 mg | ORAL_SOLUTION | Freq: Every day | ORAL | Status: DC
  Administered 2020-04-29 – 2020-05-03 (×2): 220 mg
  Filled 2020-04-28 (×5): qty 5

## 2020-04-28 MED ORDER — DIGOXIN 125 MCG PO TABS
0.0625 mg | ORAL_TABLET | Freq: Every day | ORAL | Status: DC
  Administered 2020-04-29 – 2020-05-03 (×2): 0.0625 mg
  Filled 2020-04-28 (×5): qty 0.5

## 2020-04-28 MED ORDER — AMIODARONE HCL 200 MG PO TABS
200.0000 mg | ORAL_TABLET | Freq: Every day | ORAL | Status: DC
  Administered 2020-04-29 – 2020-05-03 (×2): 200 mg
  Filled 2020-04-28 (×2): qty 1

## 2020-04-28 MED ORDER — ADULT MULTIVITAMIN LIQUID CH
15.0000 mL | Freq: Every day | ORAL | Status: DC
  Administered 2020-04-29: 15 mL
  Filled 2020-04-28 (×4): qty 15

## 2020-04-28 NOTE — Progress Notes (Signed)
Daily Progress Note   Patient Name: Raymond [name]       Date: 04/28/2020 DOB: [birth year]  Age: 59 y.o. MRN#: [id number] Attending Physician: Narda Bonds, MD Primary Care Physician: [name] Admit Date: 04/22/2020  Reason for Consultation/Follow-up: Establishing goals of care  Subjective: More interactive today, answers questions appropriately somewhat slow to respond, denies discomfort  Length of Stay: 5  Current Medications: Scheduled Meds:  . [START ON 04/29/2020] amiodarone  200 mg Per Tube Daily  . [START ON 04/29/2020] aspirin  81 mg Per Tube Daily  . benztropine  0.5 mg Oral BID  . carvedilol  3.125 mg Per Tube BID WC  . [START ON 04/29/2020] digoxin  0.0625 mg Per Tube Daily  . enoxaparin (LOVENOX) injection  40 mg Subcutaneous Q24H  . [START ON 04/29/2020] fenofibrate  160 mg Per Tube Daily  . [START ON 04/29/2020] ferrous sulfate  220 mg Per Tube Q breakfast  . fluticasone  2 spray Each Nare Daily  . glycopyrrolate  1 mg Per Tube BID  . lactulose  20 g Per Tube Daily  . [START ON 04/29/2020] loratadine  10 mg Per Tube Daily  . mouth rinse  15 mL Mouth Rinse BID  . midodrine  10 mg Per Tube TID WC  . [START ON 04/29/2020] multivitamin  15 mL Per Tube Daily  . pantoprazole (PROTONIX) IV  40 mg Intravenous Q24H  . [START ON 04/29/2020] risperiDONE  0.5 mg Per Tube Daily  . risperiDONE  1 mg Per Tube QHS  . [START ON 04/29/2020] vitamin B-12  1,000 mcg Per Tube Daily    Continuous Infusions: . sodium chloride Stopped (04/25/20 1535)  . famotidine (PEPCID) IV Stopped (04/27/20 2131)  . feeding supplement (OSMOLITE 1.2 CAL)    . lacosamide (VIMPAT) IV Stopped (04/28/20 1103)    PRN Meds: sodium chloride, acetaminophen **OR** acetaminophen, albuterol, magnesium hydroxide,  meclizine, ondansetron **OR** ondansetron (ZOFRAN) IV, traZODone  Physical Exam Constitutional:      General: He is not in acute distress. Pulmonary:     Effort: Pulmonary effort is normal.  Skin:    General: Skin is warm and dry.  Neurological:     Mental Status: He is alert.     Comments: UTA             Vital Signs: BP 108/73   Pulse 71   Temp 97.7 F (36.5 C) (Axillary)   Resp 16   Ht 6' (1.829 m)   Wt 58 kg   SpO2 97%   BMI 17.34 kg/m  SpO2: SpO2: 97 % O2 Device: O2 Device: Nasal Cannula O2 Flow Rate: O2 Flow Rate (L/min): 2 L/min  Intake/output summary:   Intake/Output Summary (Last 24 hours) at 04/28/2020 1340 Last data filed at 04/28/2020 0600 Gross per 24 hour  Intake 1001.34 ml  Output 400 ml  Net 601.34 ml   LBM: Last BM Date: 04/27/20 Baseline Weight: Weight: 58 kg Most recent weight: Weight: 58 kg       Palliative Assessment/Data: PPS 30%    Flowsheet Rows     Most Recent Value  Intake Tab  Referral Department Hospitalist  Unit at Time of Referral Med/Surg  Unit  Palliative Care Primary Diagnosis Neurology  Date Notified 04/26/20  Palliative Care Type New Palliative care  Reason for referral Clarify Goals of Care  Date of Admission 04/22/20  Date first seen by Palliative Care 04/27/20  # of days Palliative referral response time 1 Day(s)  # of days IP prior to Palliative referral 4  Clinical Assessment  Palliative Performance Scale Score 10%  Psychosocial & Spiritual Assessment  Palliative Care Outcomes  Patient/Family meeting held? No      Patient Active Problem List   Diagnosis Date Noted  . DNR (do not resuscitate)   . Encephalopathy acute 04/23/2020  . AMS (altered mental status) 04/22/2020  . Chronic systolic CHF (congestive heart failure) (HCC) 03/27/2020  . Protein-calorie malnutrition, severe 03/05/2020  . Acute kidney injury superimposed on CKD (HCC)   . Goals of care, counseling/discussion   . Palliative care by  specialist   . DNR (do not resuscitate) discussion   . Acute on chronic diastolic (congestive) heart failure (HCC) 03/02/2020  . Hypotension   . Mitral valve insufficiency   . Acute on chronic diastolic CHF (congestive heart failure) (HCC) 03/01/2020  . HTN (hypertension) 03/01/2020  . GERD (gastroesophageal reflux disease) 03/01/2020  . New onset atrial fibrillation (HCC) 03/01/2020  . Atrial fibrillation with RVR (HCC) 03/01/2020  . Seizure (HCC)   . Nose fracture 04/09/2016  . Weakness 07/11/2015  . Epileptic seizures (HCC) 01/12/2015  . Intellectual disability   . OCD (obsessive compulsive disorder)   . Hypertriglyceridemia   . Epilepsy (HCC)   . Systolic murmur     Palliative Care Assessment & Plan   HPI: 59 y.o. male  with past medical history of seizure disorder, cognitive impairment with aphasia, OCD, HTN, HLD, a fib, severe mitral regurgitation, and CHF admitted on 04/22/2020 with AMS after being found on the floor at SNF. AMS continues through hospitalization - now NPO d/t mental status. Ammonia elevated. PMT consulted for GOC.  Assessment: Ammonia better. Patient more responsive.  Plan for trial of tube feeds over weekend noted.  Was able to speak with patient's DSS legal guardian - [name]. She provides more history - patient doing very well until about 1.5 months ago. Since then has rapidly declined. Tells me short term plan is for him to return to SNF but she hopes he will regain some function and be able to return to group home. We discussed his frailty and uncertainty surrounding outcomes. She is understanding. Discussed plan of trial of tube feeds and reevaluate early next week. She is agreeable.  Recommendations/Plan:  PMT will follow along - trial of tube feeds over weekend. Will plan to follow up early next week after allowing time for outcome  Code Status:  DNR  Prognosis:   Unable to determine  Discharge Planning:  To Be Determined  Care  plan was discussed with legal guardian and Dr. [name]  Thank you for allowing the Palliative Medicine Team to assist in the care of this patient.   Total Time 25 minutes Prolonged Time Billed  no       Greater than 50%  of this time was spent counseling and coordinating care related to the above assessment and plan.  Gerlean Ren, DNP, [facility] Palliative Medicine Team Team Phone # [phone]  Pager [phone]

## 2020-04-28 NOTE — Progress Notes (Signed)
PROGRESS NOTE    [name]  KCL:[id number] DOB: 04-12-1961 DOA: 04/22/2020 PCP: [name]   Brief Narrative: Raymond [name] is a 59 y.o. male coming from [facility] with a known history of seizure disorder, mental retardation, obsessive-compulsive disorder, dyslipidemia and severe mitral regurgitation and CHF. Patient presented secondary to acute altered mental status. Likely related to hyperammonemia in setting of Depakote use.   Assessment & Plan:   Active Problems:   AMS (altered mental status)   Encephalopathy acute   DNR (do not resuscitate)   Acute metabolic encephalopathy In setting of hyperammoniemia. Imaging has ruled out hepatic source. Likely mediation related in setting of Depakote use. Ammonia continues to rise. CTA head/MRI brain significant for no acute intracranial process and evidence of moderate cerebral atroph/chronic ischemic changes. UDS significant for MDMA possibly falsely positive secondary to medications. Mental status improved today. Depakote and Keppra discontinued. Patient started on Vimpat -Discussed with neurology with recommendations for Vimpat 200 mg load followed by Vimpat 100 mg q12 hours -SLP recommending NPO  Hyperammonemia No cirrhosis noted on RUQ ultrasound obtained 9/29. Downtrended in light of BM. -Lactulose per tube  Essential hypertension Hypotension while inpatient. Currently controlled. On Coreg and Cozaar which were held.  Hyponatremia Mild. Resolved.  Seizure disorder Followed by neurology as an outpatient. Discontinued Depakote and Keppra -Continue Vimpat 200 mg bolus followed by Vimpat 100 mg q12 hours per neurology  Paroxysmal atrial fibrillation Rate controlled. On amiodarone and Coreg as an outpatient which is unable to be given today secondary to mental status.  Hyperlipidemia On fenofibrate as an outpatient.  Elevated TSH/Free T4 In setting of amiodarone use. No history of thyroid  disorder. No symptoms/signs of thyrotoxicosis. -Cardiology follow-up  Hypomagnesemia Repletion given.  Thrombocytopenia Mild. Unsure of etiology. No evidence of bleeding.  Mitral regurgitation Severe on Transthoracic Echocardiogram  Chronic systolic heart failure Stable. Last EF of 40-45%  DVT prophylaxis: Lovenox Code Status:   Code Status: DNR Family Communication: None at bedside Disposition Plan: Discharge likely to SNF if able to improve swallowing vs continued goals of care. Discharge likely in 3-4 days after trial of tube feeds via NG tube   Consultants:   Palliative care medicine  Procedures:   NG tube placement (9/30)  Antimicrobials:  None   Subjective: No issues overnight. Non-verbal mostly  Objective: Vitals:   04/27/20 1936 04/28/20 0549 04/28/20 1106 04/28/20 1206  BP: 106/79 110/70 107/74 108/73  Pulse: 73 75 90 71  Resp: 20 18 20 16   Temp: 97.8 F (36.6 C) 98.2 F (36.8 C) 97.7 F (36.5 C)   TempSrc: Oral Oral Axillary   SpO2: 99% 100% 96% 97%  Weight:      Height:        Intake/Output Summary (Last 24 hours) at 04/28/2020 1324 Last data filed at 04/28/2020 0600 Gross per 24 hour  Intake 1001.34 ml  Output 400 ml  Net 601.34 ml   Filed Weights   04/22/20 1549  Weight: 58 kg    Examination:  General exam: Appears calm and comfortable Respiratory system: Clear to auscultation. Respiratory effort normal. Cardiovascular system: S1 & S2 heard, RRR. 2/6 systolic murmur Gastrointestinal system: Abdomen is nondistended, soft and nontender. No organomegaly or masses felt. Normal bowel sounds heard. Central nervous system: Alert. Musculoskeletal: No edema. No calf tenderness Skin: No cyanosis. No rashes Psychiatry: Blunt affect    Data Reviewed: I have personally reviewed following labs and imaging studies  CBC Lab Results  Component  Value Date   WBC 5.1 04/26/2020   RBC 3.22 (L) 04/26/2020   HGB 10.5 (L) 04/26/2020   HCT 30.1  (L) 04/26/2020   MCV 93.5 04/26/2020   MCH 32.6 04/26/2020   PLT 148 (L) 04/26/2020   MCHC 34.9 04/26/2020   RDW 14.6 04/26/2020   LYMPHSABS 2.0 04/26/2020   MONOABS 0.6 04/26/2020   EOSABS 0.3 04/26/2020   BASOSABS 0.1 04/26/2020     Last metabolic panel Lab Results  Component Value Date   NA 140 04/26/2020   K 3.6 04/26/2020   CL 106 04/26/2020   CO2 24 04/26/2020   BUN 8 04/26/2020   CREATININE 0.85 04/26/2020   GLUCOSE 113 (H) 04/26/2020   GFRNONAA >60 04/26/2020   GFRAA >60 04/26/2020   CALCIUM 8.7 (L) 04/26/2020   PHOS 3.6 04/26/2020   PROT 5.6 (L) 04/26/2020   ALBUMIN 2.6 (L) 04/26/2020   LABGLOB 2.7 12/19/2017   AGRATIO 1.6 12/19/2017   BILITOT 0.7 04/26/2020   ALKPHOS 30 (L) 04/26/2020   AST 26 04/26/2020   ALT 13 04/26/2020   ANIONGAP 10 04/26/2020    CBG (last 3)  No results for input(s): GLUCAP in the last 72 hours.   GFR: Estimated Creatinine Clearance: 78.7 mL/min (by C-G formula based on SCr of 0.85 mg/dL).  Coagulation Profile: Recent Labs  Lab 04/22/20 2034  INR 1.3*    Recent Results (from the past 240 hour(s))  Respiratory Panel by RT PCR (Flu A&B, Covid) - Nasopharyngeal Swab     Status: None   Collection Time: 04/22/20  4:04 PM   Specimen: Nasopharyngeal Swab  Result Value Ref Range Status   SARS Coronavirus 2 by RT PCR NEGATIVE NEGATIVE Final    Comment: (NOTE) SARS-CoV-2 target nucleic acids are NOT DETECTED.  The SARS-CoV-2 RNA is generally detectable in upper respiratoy specimens during the acute phase of infection. The lowest concentration of SARS-CoV-2 viral copies this assay can detect is 131 copies/mL. A negative result does not preclude SARS-Cov-2 infection and should not be used as the sole basis for treatment or other patient management decisions. A negative result may occur with  improper specimen collection/handling, submission of specimen other than nasopharyngeal swab, presence of viral mutation(s) within  the areas targeted by this assay, and inadequate number of viral copies (<131 copies/mL). A negative result must be combined with clinical observations, patient history, and epidemiological information. The expected result is Negative.  Fact Sheet for Patients:  https://www.moore.com/https://www.fda.gov/media/142436/download  Fact Sheet for Healthcare Providers:  https://www.young.biz/https://www.fda.gov/media/142435/download  This test is no t yet approved or cleared by the [place] FDA and  has been authorized for detection and/or diagnosis of SARS-CoV-2 by FDA under an Emergency Use Authorization (EUA). This EUA will remain  in effect (meaning this test can be used) for the duration of the COVID-19 declaration under Section 564(b)(1) of the Act, 21 U.S.C. section 360bbb-3(b)(1), unless the authorization is terminated or revoked sooner.     Influenza A by PCR NEGATIVE NEGATIVE Final   Influenza B by PCR NEGATIVE NEGATIVE Final    Comment: (NOTE) The Xpert Xpress SARS-CoV-2/FLU/RSV assay is intended as an aid in  the diagnosis of influenza from Nasopharyngeal swab specimens and  should not be used as a sole basis for treatment. Nasal washings and  aspirates are unacceptable for Xpert Xpress SARS-CoV-2/FLU/RSV  testing.  Fact Sheet for Patients: https://www.moore.com/https://www.fda.gov/media/142436/download  Fact Sheet for Healthcare Providers: https://www.young.biz/https://www.fda.gov/media/142435/download  This test is not yet approved or cleared by the [place]  FDA and  has been authorized for detection and/or diagnosis of SARS-CoV-2 by  FDA under an Emergency Use Authorization (EUA). This EUA will remain  in effect (meaning this test can be used) for the duration of the  Covid-19 declaration under Section 564(b)(1) of the Act, 21  U.S.C. section 360bbb-3(b)(1), unless the authorization is  terminated or revoked. Performed at [facility], [address], [place], [place] [address]         Radiology Studies: DG Abd 1  View  Result Date: 04/27/2020 CLINICAL DATA:  NG tube placement EXAM: ABDOMEN - 1 VIEW COMPARISON:  03/23/2020 chest x-ray FINDINGS: NG tube loops in the lower cervical region. Mild cardiomegaly. Diffuse interstitial prominence. Bibasilar atelectasis or infiltrates. IMPRESSION: NG tube loops in the lower neck. Recommend complete removal and replacement. Diffuse interstitial prominence with bibasilar atelectasis or infiltrates. Electronically Signed   By: Charlett Nose M.D.   On: 04/27/2020 16:42   MR BRAIN WO CONTRAST  Result Date: 04/27/2020 CLINICAL DATA:  Encephalopathy EXAM: MRI HEAD WITHOUT CONTRAST TECHNIQUE: Multiplanar, multiecho pulse sequences of the brain and surrounding structures were obtained without intravenous contrast. COMPARISON:  03/26/2020 FINDINGS: Brain: No acute infarct, acute hemorrhage or extra-axial collection. Multifocal hyperintense T2-weighted signal within the white matter. There is generalized atrophy without lobar predilection. No chronic microhemorrhage. Normal midline structures. Vascular: Normal flow voids. Skull and upper cervical spine: Normal marrow signal. Sinuses/Orbits: Negative. Other: None. IMPRESSION: 1. No acute intracranial abnormality. 2. Generalized atrophy and findings of chronic small vessel disease. Electronically Signed   By: Deatra Robinson M.D.   On: 04/27/2020 02:04   DG Basil Dess Tube Plc W/Fl W/Rad  Result Date: 04/28/2020 CLINICAL DATA:  NG tube placement. EXAM: NASO G TUBE PLACEMENT WITH FL AND WITH RAD CONTRAST:  None. FLUOROSCOPY TIME:  Fluoroscopy Time:  0 minutes 54 seconds Radiation Exposure Index (if provided by the fluoroscopic device): 2.7 mGy Scratch COMPARISON:  Abdomen 04/27/2020. FINDINGS: Under fluoroscopic guidance NG tube was placed with tip placed into the stomach. There were no complications. IMPRESSION: Successful fluoroscopically directed NG tube placement. Electronically Signed   By: Maisie Fus  Register   On: 04/28/2020 08:55   US  Abdomen Limited RUQ  Result Date: 04/27/2020 CLINICAL DATA:  Elevated ammonia levels EXAM: ULTRASOUND ABDOMEN LIMITED RIGHT UPPER QUADRANT COMPARISON:  None. FINDINGS: Gallbladder: Gallbladder is well distended with gallbladder sludge within. No wall thickening or pericholecystic fluid is noted. Common bile duct: Diameter: 3.5 mm. Liver: No focal lesion identified. Within normal limits in parenchymal echogenicity. Portal vein is patent on color Doppler imaging with normal direction of blood flow towards the liver. Other: Right-sided pleural effusion is noted. IMPRESSION: Gallbladder sludge without complicating factors. Right-sided pleural fluid Electronically Signed   By: Alcide Clever M.D.   On: 04/27/2020 09:43        Scheduled Meds: . [START ON 04/29/2020] amiodarone  200 mg Per Tube Daily  . [START ON 04/29/2020] aspirin  81 mg Per Tube Daily  . benztropine  0.5 mg Oral BID  . carvedilol  3.125 mg Per Tube BID WC  . [START ON 04/29/2020] digoxin  0.0625 mg Per Tube Daily  . enoxaparin (LOVENOX) injection  40 mg Subcutaneous Q24H  . [START ON 04/29/2020] fenofibrate  160 mg Per Tube Daily  . [START ON 04/29/2020] ferrous sulfate  220 mg Per Tube Q breakfast  . fluticasone  2 spray Each Nare Daily  . glycopyrrolate  1 mg Per Tube BID  . lactulose  300 mL Rectal Daily  . [START ON 04/29/2020] loratadine  10 mg Per Tube Daily  . mouth rinse  15 mL Mouth Rinse BID  . midodrine  10 mg Per Tube TID WC  . [START ON 04/29/2020] multivitamin  15 mL Per Tube Daily  . pantoprazole (PROTONIX) IV  40 mg Intravenous Q24H  . [START ON 04/29/2020] risperiDONE  0.5 mg Per Tube Daily  . risperiDONE  1 mg Per Tube QHS  . [START ON 04/29/2020] vitamin B-12  1,000 mcg Per Tube Daily   Continuous Infusions: . sodium chloride Stopped (04/25/20 1535)  . famotidine (PEPCID) IV Stopped (04/27/20 2131)  . feeding supplement (OSMOLITE 1.2 CAL)    . lacosamide (VIMPAT) IV Stopped (04/28/20 1103)     LOS: 5 days      Jacquelin Hawking, MD [facility] 04/28/2020, 1:24 PM  If 7PM-7AM, please contact night-coverage [email or web address]
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
## 2020-04-28 NOTE — Progress Notes (Signed)
Physical Therapy Treatment Patient Details Name: Raymond [name] MRN: [id number] DOB: 1961/04/21 Today's Date: 04/28/2020    History of Present Illness Raymond [name]  is a 59 y.o. Caucasian male coming from [facility] with a known history of seizure disorder, mental retardation, obsessive-compulsive disorder, dyslipidemia and severe mitral regurgitation and CHF, who presented to the emergency room with acute onset of altered mental status.  He was found on the floor at his SNF.  No reported dysuria, oliguria or hematuria or flank pain.  No reported cough or wheezing or dyspnea.  No reported chest pain or palpitations.  The patient is a very poor historian due to his altered mental status as well as underlying developmental delay.    PT Comments    Pt presents in fowler's position on arrival to room. He is lethargic throughout session, but able to engage with supine exercises. When trying to get him sitting EOB pt doesn't move. Increased reps and resistance for therapeutic exercises and patient notes he is tired and doesn't want to participate in more exercises. Pt is positioned leaning towards L side so repositioned him so he's straighter in bed. PT left in bed with all needs. He will benefit from skilled PT in order to address deficits in strength and mobility in order to return to PLOF with minimal difficulties.   Follow Up Recommendations  SNF     Equipment Recommendations  None recommended by PT    Recommendations for Other Services       Precautions / Restrictions Precautions Precautions: Fall Restrictions Weight Bearing Restrictions: No    Mobility  Bed Mobility               General bed mobility comments: Did not perform due to patient tired after supine exercises  Transfers                 General transfer comment: Did not attempt today  Ambulation/Gait             General Gait Details: Did not attempt   Stairs              Wheelchair Mobility    Modified Rankin (Stroke Patients Only)       Balance       Sitting balance - Comments: Did not attempt       Standing balance comment: Did not attempt                            Cognition Arousal/Alertness: Lethargic Behavior During Therapy: Flat affect Overall Cognitive Status: History of cognitive impairments - at baseline                                        Exercises General Exercises - Upper Extremity Elbow Flexion: Strengthening;Both;10 reps;Supine (manual resistance) Elbow Extension: Strengthening;Both;10 reps;Supine (manual resistance) General Exercises - Lower Extremity Ankle Circles/Pumps: AROM;Both;10 reps;Supine Quad Sets: AROM;Both;10 reps;Supine Heel Slides: AROM;Both;10 reps;Supine Hip ABduction/ADduction: AROM;Both;10 reps;Supine Straight Leg Raises: Both;AAROM;10 reps;Supine Other Exercises Other Exercises: Pt performed 10 shoulder shrugs    General Comments General comments (skin integrity, edema, etc.): Pt has mitts on both hands      Pertinent Vitals/Pain Pain Assessment: No/denies pain    Home Living  Prior Function            PT Goals (current goals can now be found in the care plan section) Acute Rehab PT Goals Patient Stated Goal: "to go home" PT Goal Formulation: Patient unable to participate in goal setting Time For Goal Achievement: 05/07/20 Potential to Achieve Goals: Fair Progress towards PT goals: Progressing toward goals    Frequency    Min 2X/week      PT Plan Current plan remains appropriate    Co-evaluation              AM-PAC PT "6 Clicks" Mobility   Outcome Measure  Help needed turning from your back to your side while in a flat bed without using bedrails?: Total Help needed moving from lying on your back to sitting on the side of a flat bed without using bedrails?: Total Help needed moving to and from a bed to a  chair (including a wheelchair)?: Total Help needed standing up from a chair using your arms (e.g., wheelchair or bedside chair)?: Total Help needed to walk in hospital room?: Total Help needed climbing 3-5 steps with a railing? : Total 6 Click Score: 6    End of Session Equipment Utilized During Treatment: Gait belt;Oxygen Activity Tolerance: Patient limited by lethargy Patient left: in bed;with call bell/phone within reach;with bed alarm set Nurse Communication: Mobility status PT Visit Diagnosis: Muscle weakness (generalized) (M62.81);Difficulty in walking, not elsewhere classified (R26.2)     Time: 9735-3299 PT Time Calculation (min) (ACUTE ONLY): 14 min  Charges:                          Katherine Basset, SPT Raymond [name] 04/28/2020, 12:03 PM

## 2020-04-29 DIAGNOSIS — Z515 Encounter for palliative care: Secondary | ICD-10-CM

## 2020-04-29 DIAGNOSIS — Z66 Do not resuscitate: Secondary | ICD-10-CM

## 2020-04-29 DIAGNOSIS — R4182 Altered mental status, unspecified: Secondary | ICD-10-CM | POA: Diagnosis not present

## 2020-04-29 DIAGNOSIS — Z7189 Other specified counseling: Secondary | ICD-10-CM | POA: Diagnosis not present

## 2020-04-29 LAB — COMPREHENSIVE METABOLIC PANEL
ALT: 11 U/L (ref 0–44)
AST: 20 U/L (ref 15–41)
Albumin: 2.6 g/dL — ABNORMAL LOW (ref 3.5–5.0)
Alkaline Phosphatase: 27 U/L — ABNORMAL LOW (ref 38–126)
Anion gap: 6 (ref 5–15)
BUN: 14 mg/dL (ref 6–20)
CO2: 26 mmol/L (ref 22–32)
Calcium: 8.4 mg/dL — ABNORMAL LOW (ref 8.9–10.3)
Chloride: 106 mmol/L (ref 98–111)
Creatinine, Ser: 0.79 mg/dL (ref 0.61–1.24)
GFR calc Af Amer: >60 mL/min (ref >60)
GFR calc non Af Amer: >60 mL/min (ref >60)
Glucose, Bld: 149 mg/dL — ABNORMAL HIGH (ref 70–99)
Potassium: 3.5 mmol/L (ref 3.5–5.1)
Sodium: 138 mmol/L (ref 135–145)
Total Bilirubin: 0.5 mg/dL (ref 0.3–1.2)
Total Protein: 5.3 g/dL — ABNORMAL LOW (ref 6.5–8.1)

## 2020-04-29 LAB — GLUCOSE, CAPILLARY
Glucose-Capillary: 123 mg/dL — ABNORMAL HIGH (ref 70–99)
Glucose-Capillary: 144 mg/dL — ABNORMAL HIGH (ref 70–99)
Glucose-Capillary: 146 mg/dL — ABNORMAL HIGH (ref 70–99)
Glucose-Capillary: 147 mg/dL — ABNORMAL HIGH (ref 70–99)
Glucose-Capillary: 151 mg/dL — ABNORMAL HIGH (ref 70–99)
Glucose-Capillary: 157 mg/dL — ABNORMAL HIGH (ref 70–99)

## 2020-04-29 MED ORDER — LACTULOSE 10 GM/15ML PO SOLN
30.0000 g | Freq: Every day | ORAL | Status: DC
  Administered 2020-04-29: 30 g
  Filled 2020-04-29: qty 60

## 2020-04-29 NOTE — Progress Notes (Signed)
PROGRESS NOTE    Raymond [name]  AYT:[id number] DOB: [birth year] DOA: 04/22/2020 PCP: [name]   Brief Narrative: Raymond [name] is a 59 y.o. male coming from [facility] with a known history of seizure disorder, mental retardation, obsessive-compulsive disorder, dyslipidemia and severe mitral regurgitation and CHF. Patient presented secondary to acute altered mental status. Likely related to hyperammonemia in setting of Depakote use.   Assessment & Plan:   Active Problems:   Intellectual disability   Epilepsy (HCC)   Protein-calorie malnutrition, severe   Chronic systolic CHF (congestive heart failure) (HCC)   AMS (altered mental status)   Encephalopathy acute   DNR (do not resuscitate)   Acute metabolic encephalopathy In setting of hyperammoniemia. Imaging has ruled out hepatic source. Likely mediation related in setting of Depakote use. Ammonia continues to rise. CTA head/MRI brain significant for no acute intracranial process and evidence of moderate cerebral atroph/chronic ischemic changes. UDS significant for MDMA possibly falsely positive secondary to medications. Mental status improved today. Depakote and Keppra discontinued. Patient started on Vimpat -Discussed with neurology with recommendations for Vimpat 200 mg load followed by Vimpat 100 mg q12 hours; Continue Vimpat -SLP recommending NPO  Hyperammonemia No cirrhosis noted on RUQ ultrasound obtained 9/29. Downtrended in light of BM. No recent bowel movement -Lactulose per tube; increase dose -AM ammonia  Dysphagia Oropharyngeal. NG tube placed -Continue tube feeds per NG tube over the weekend; will remove NG tube next week and have SLP reevaluate  Essential hypertension Hypotension while inpatient. Currently controlled. On Coreg and Cozaar which were held.  Hyponatremia Mild. Resolved.  Seizure disorder Followed by neurology as an outpatient. Discontinued Depakote and  Keppra -Continue Vimpat 100 mg q12 hours per neurology  Paroxysmal atrial fibrillation Rate controlled. On amiodarone and Coreg as an outpatient which is unable to be given today secondary to mental status.  Hyperlipidemia On fenofibrate as an outpatient.  Elevated TSH/Free T4 In setting of amiodarone use. No history of thyroid disorder. No symptoms/signs of thyrotoxicosis. -Cardiology follow-up  Hypomagnesemia Repletion given.  Thrombocytopenia Mild. Unsure of etiology. No evidence of bleeding.  Mitral regurgitation Severe on Transthoracic Echocardiogram  Chronic systolic heart failure Stable. Last EF of 40-45%  DVT prophylaxis: Lovenox Code Status:   Code Status: DNR Family Communication: None at bedside Disposition Plan: Discharge likely to SNF if able to improve swallowing vs continued goals of care. Discharge likely in 3-4 days after trial of tube feeds via NG tube   Consultants:   Palliative care medicine  Procedures:   NG tube placement (9/30)  Antimicrobials:  None   Subjective: No concerns per patient  Objective: Vitals:   04/28/20 1945 04/29/20 0451 04/29/20 0747 04/29/20 0748  BP: 104/67 91/62 107/63   Pulse: 76 71 78   Resp: 17 16 16    Temp: 97.8 F (36.6 C) 98.1 F (36.7 C)  (!) 97.5 F (36.4 C)  TempSrc: Oral Oral  Oral  SpO2: 97% 100% 99%   Weight:  60.2 kg    Height:        Intake/Output Summary (Last 24 hours) at 04/29/2020 0829 Last data filed at 04/29/2020 0500 Gross per 24 hour  Intake 878.19 ml  Output 200 ml  Net 678.19 ml   Filed Weights   04/22/20 1549 04/29/20 0451  Weight: 58 kg 60.2 kg    Examination:  General exam: Appears calm and comfortable Respiratory system: Clear to auscultation. Respiratory effort normal. Cardiovascular system: S1 & S2 heard, RRR. 2/6 systolic  murmur Gastrointestinal system: Abdomen is nondistended, soft and nontender. No organomegaly or masses felt. Normal bowel sounds heard. Central  nervous system: Alert. Answers questions with head nods/shakes. Musculoskeletal: No edema. No calf tenderness Skin: No cyanosis. No rashes Psychiatry: Blunt affect    Data Reviewed: I have personally reviewed following labs and imaging studies  CBC Lab Results  Component Value Date   WBC 5.1 04/26/2020   RBC 3.22 (L) 04/26/2020   HGB 10.5 (L) 04/26/2020   HCT 30.1 (L) 04/26/2020   MCV 93.5 04/26/2020   MCH 32.6 04/26/2020   PLT 148 (L) 04/26/2020   MCHC 34.9 04/26/2020   RDW 14.6 04/26/2020   LYMPHSABS 2.0 04/26/2020   MONOABS 0.6 04/26/2020   EOSABS 0.3 04/26/2020   BASOSABS 0.1 04/26/2020     Last metabolic panel Lab Results  Component Value Date   NA 138 04/29/2020   K 3.5 04/29/2020   CL 106 04/29/2020   CO2 26 04/29/2020   BUN 14 04/29/2020   CREATININE 0.79 04/29/2020   GLUCOSE 149 (H) 04/29/2020   GFRNONAA >60 04/29/2020   GFRAA >60 04/29/2020   CALCIUM 8.4 (L) 04/29/2020   PHOS 3.6 04/26/2020   PROT 5.3 (L) 04/29/2020   ALBUMIN 2.6 (L) 04/29/2020   LABGLOB 2.7 12/19/2017   AGRATIO 1.6 12/19/2017   BILITOT 0.5 04/29/2020   ALKPHOS 27 (L) 04/29/2020   AST 20 04/29/2020   ALT 11 04/29/2020   ANIONGAP 6 04/29/2020    CBG (last 3)  Recent Labs    04/28/20 2359 04/29/20 0446 04/29/20 0752  GLUCAP 123* 147* 157*     GFR: Estimated Creatinine Clearance: 86.7 mL/min (by C-G formula based on SCr of 0.79 mg/dL).  Coagulation Profile: Recent Labs  Lab 04/22/20 2034  INR 1.3*    Recent Results (from the past 240 hour(s))  Respiratory Panel by RT PCR (Flu A&B, Covid) - Nasopharyngeal Swab     Status: None   Collection Time: 04/22/20  4:04 PM   Specimen: Nasopharyngeal Swab  Result Value Ref Range Status   SARS Coronavirus 2 by RT PCR NEGATIVE NEGATIVE Final    Comment: (NOTE) SARS-CoV-2 target nucleic acids are NOT DETECTED.  The SARS-CoV-2 RNA is generally detectable in upper respiratoy specimens during the acute phase of infection. The  lowest concentration of SARS-CoV-2 viral copies this assay can detect is 131 copies/mL. A negative result does not preclude SARS-Cov-2 infection and should not be used as the sole basis for treatment or other patient management decisions. A negative result may occur with  improper specimen collection/handling, submission of specimen other than nasopharyngeal swab, presence of viral mutation(s) within the areas targeted by this assay, and inadequate number of viral copies (<131 copies/mL). A negative result must be combined with clinical observations, patient history, and epidemiological information. The expected result is Negative.  Fact Sheet for Patients:  https://www.moore.com/  Fact Sheet for Healthcare Providers:  https://www.young.biz/  This test is no t yet approved or cleared by the [place] FDA and  has been authorized for detection and/or diagnosis of SARS-CoV-2 by FDA under an Emergency Use Authorization (EUA). This EUA will remain  in effect (meaning this test can be used) for the duration of the COVID-19 declaration under Section 564(b)(1) of the Act, 21 U.S.C. section 360bbb-3(b)(1), unless the authorization is terminated or revoked sooner.     Influenza A by PCR NEGATIVE NEGATIVE Final   Influenza B by PCR NEGATIVE NEGATIVE Final    Comment: (NOTE) The Xpert  Xpress SARS-CoV-2/FLU/RSV assay is intended as an aid in  the diagnosis of influenza from Nasopharyngeal swab specimens and  should not be used as a sole basis for treatment. Nasal washings and  aspirates are unacceptable for Xpert Xpress SARS-CoV-2/FLU/RSV  testing.  Fact Sheet for Patients: https://www.moore.com/  Fact Sheet for Healthcare Providers: https://www.young.biz/  This test is not yet approved or cleared by the [place] FDA and  has been authorized for detection and/or diagnosis of SARS-CoV-2 by  FDA under  an Emergency Use Authorization (EUA). This EUA will remain  in effect (meaning this test can be used) for the duration of the  Covid-19 declaration under Section 564(b)(1) of the Act, 21  U.S.C. section 360bbb-3(b)(1), unless the authorization is  terminated or revoked. Performed at [facility], [address] [place], [place] [address]         Radiology Studies: DG Abd 1 View  Result Date: 04/27/2020 CLINICAL DATA:  NG tube placement EXAM: ABDOMEN - 1 VIEW COMPARISON:  03/23/2020 chest x-ray FINDINGS: NG tube loops in the lower cervical region. Mild cardiomegaly. Diffuse interstitial prominence. Bibasilar atelectasis or infiltrates. IMPRESSION: NG tube loops in the lower neck. Recommend complete removal and replacement. Diffuse interstitial prominence with bibasilar atelectasis or infiltrates. Electronically Signed   By: Charlett Nose M.D.   On: 04/27/2020 16:42   DG Naso G Tube Plc W/Fl W/Rad  Result Date: 04/28/2020 CLINICAL DATA:  NG tube placement. EXAM: NASO G TUBE PLACEMENT WITH FL AND WITH RAD CONTRAST:  None. FLUOROSCOPY TIME:  Fluoroscopy Time:  0 minutes 54 seconds Radiation Exposure Index (if provided by the fluoroscopic device): 2.7 mGy Scratch COMPARISON:  Abdomen 04/27/2020. FINDINGS: Under fluoroscopic guidance NG tube was placed with tip placed into the stomach. There were no complications. IMPRESSION: Successful fluoroscopically directed NG tube placement. Electronically Signed   By: Maisie Fus  Register   On: 04/28/2020 08:55   US Abdomen Limited RUQ  Result Date: 04/27/2020 CLINICAL DATA:  Elevated ammonia levels EXAM: ULTRASOUND ABDOMEN LIMITED RIGHT UPPER QUADRANT COMPARISON:  None. FINDINGS: Gallbladder: Gallbladder is well distended with gallbladder sludge within. No wall thickening or pericholecystic fluid is noted. Common bile duct: Diameter: 3.5 mm. Liver: No focal lesion identified. Within normal limits in parenchymal echogenicity. Portal vein is patent on  color Doppler imaging with normal direction of blood flow towards the liver. Other: Right-sided pleural effusion is noted. IMPRESSION: Gallbladder sludge without complicating factors. Right-sided pleural fluid Electronically Signed   By: Alcide Clever M.D.   On: 04/27/2020 09:43        Scheduled Meds: . amiodarone  200 mg Per Tube Daily  . aspirin  81 mg Per Tube Daily  . benztropine  0.5 mg Oral BID  . carvedilol  3.125 mg Per Tube BID WC  . digoxin  0.0625 mg Per Tube Daily  . enoxaparin (LOVENOX) injection  40 mg Subcutaneous Q24H  . fenofibrate  160 mg Per Tube Daily  . ferrous sulfate  220 mg Per Tube Q breakfast  . fluticasone  2 spray Each Nare Daily  . glycopyrrolate  1 mg Per Tube BID  . lactulose  20 g Per Tube Daily  . loratadine  10 mg Per Tube Daily  . mouth rinse  15 mL Mouth Rinse BID  . midodrine  10 mg Per Tube TID WC  . multivitamin  15 mL Per Tube Daily  . pantoprazole (PROTONIX) IV  40 mg Intravenous Q24H  . risperiDONE  0.5 mg Per Tube  Daily  . risperiDONE  1 mg Per Tube QHS  . vitamin B-12  1,000 mcg Per Tube Daily   Continuous Infusions: . sodium chloride Stopped (04/25/20 1535)  . famotidine (PEPCID) IV Stopped (04/28/20 2117)  . feeding supplement (OSMOLITE 1.2 CAL) 1,000 mL (04/28/20 1650)  . lacosamide (VIMPAT) IV Stopped (04/28/20 2158)     LOS: 6 days     Jacquelin Hawkingalph Fraidy Mccarrick, MD [facility] 04/29/2020, 8:29 AM  If 7PM-7AM, please contact night-coverage [email or web address]

## 2020-04-30 ENCOUNTER — Inpatient Hospital Stay: Payer: Medicare Other

## 2020-04-30 DIAGNOSIS — Z7189 Other specified counseling: Secondary | ICD-10-CM | POA: Diagnosis not present

## 2020-04-30 DIAGNOSIS — Z66 Do not resuscitate: Secondary | ICD-10-CM | POA: Diagnosis not present

## 2020-04-30 DIAGNOSIS — R4182 Altered mental status, unspecified: Secondary | ICD-10-CM | POA: Diagnosis not present

## 2020-04-30 DIAGNOSIS — Z515 Encounter for palliative care: Secondary | ICD-10-CM | POA: Diagnosis not present

## 2020-04-30 LAB — GLUCOSE, CAPILLARY
Glucose-Capillary: 100 mg/dL — ABNORMAL HIGH (ref 70–99)
Glucose-Capillary: 104 mg/dL — ABNORMAL HIGH (ref 70–99)
Glucose-Capillary: 106 mg/dL — ABNORMAL HIGH (ref 70–99)
Glucose-Capillary: 106 mg/dL — ABNORMAL HIGH (ref 70–99)
Glucose-Capillary: 110 mg/dL — ABNORMAL HIGH (ref 70–99)
Glucose-Capillary: 111 mg/dL — ABNORMAL HIGH (ref 70–99)

## 2020-04-30 LAB — COMPREHENSIVE METABOLIC PANEL
ALT: 11 U/L (ref 0–44)
AST: 31 U/L (ref 15–41)
Albumin: 2.7 g/dL — ABNORMAL LOW (ref 3.5–5.0)
Alkaline Phosphatase: 28 U/L — ABNORMAL LOW (ref 38–126)
Anion gap: 6 (ref 5–15)
BUN: 10 mg/dL (ref 6–20)
CO2: 25 mmol/L (ref 22–32)
Calcium: 8.7 mg/dL — ABNORMAL LOW (ref 8.9–10.3)
Chloride: 102 mmol/L (ref 98–111)
Creatinine, Ser: 0.63 mg/dL (ref 0.61–1.24)
GFR calc Af Amer: >60 mL/min (ref >60)
GFR calc non Af Amer: >60 mL/min (ref >60)
Glucose, Bld: 105 mg/dL — ABNORMAL HIGH (ref 70–99)
Potassium: 4.2 mmol/L (ref 3.5–5.1)
Sodium: 133 mmol/L — ABNORMAL LOW (ref 135–145)
Total Bilirubin: 0.6 mg/dL (ref 0.3–1.2)
Total Protein: 5.6 g/dL — ABNORMAL LOW (ref 6.5–8.1)

## 2020-04-30 LAB — AMMONIA: Ammonia: 61 umol/L — ABNORMAL HIGH (ref 9–35)

## 2020-04-30 MED ORDER — DIPHENHYDRAMINE HCL 50 MG/ML IJ SOLN: 25.0000 mg | Freq: Every evening | INTRAMUSCULAR | Status: DC | PRN

## 2020-04-30 MED ORDER — GLYCOPYRROLATE 0.2 MG/ML IJ SOLN
0.1000 mg | Freq: Once | INTRAMUSCULAR | Status: AC
  Administered 2020-04-30: 0.1 mg via INTRAVENOUS
  Filled 2020-04-30: qty 0.5

## 2020-04-30 MED ORDER — BENZTROPINE MESYLATE 1 MG/ML IJ SOLN
0.5000 mg | Freq: Once | INTRAMUSCULAR | Status: AC
  Administered 2020-04-30: 0.5 mg via INTRAMUSCULAR
  Filled 2020-04-30: qty 0.5

## 2020-04-30 NOTE — Progress Notes (Signed)
PROGRESS NOTE    JAREB RADONCIC  PPI:[id number] DOB: [birth year] DOA: 04/22/2020 PCP: [name]   Brief Narrative: Raymond [name] is a 59 y.o. male coming from [facility] with a known history of seizure disorder, mental retardation, obsessive-compulsive disorder, dyslipidemia and severe mitral regurgitation and CHF. Patient presented secondary to acute altered mental status. Likely related to hyperammonemia in setting of Depakote use.   Assessment & Plan:   Active Problems:   Intellectual disability   Epilepsy (HCC)   Protein-calorie malnutrition, severe   Chronic systolic CHF (congestive heart failure) (HCC)   AMS (altered mental status)   Encephalopathy acute   DNR (do not resuscitate)   Acute metabolic encephalopathy In setting of hyperammoniemia. Imaging has ruled out hepatic source. Likely mediation related in setting of Depakote use. Ammonia continues to rise. CTA head/MRI brain significant for no acute intracranial process and evidence of moderate cerebral atroph/chronic ischemic changes. UDS significant for MDMA possibly falsely positive secondary to medications. Mental status improved today. Depakote and Keppra discontinued. Patient started on Vimpat -Discussed with neurology with recommendations for Vimpat 200 mg load followed by Vimpat 100 mg q12 hours; Continue Vimpat -SLP recommending NPO  Hyperammonemia No cirrhosis noted on RUQ ultrasound obtained 9/29. Downtrended in light of BM. No recent bowel movement -Lactulose per tube; increase dose -AM ammonia  Dysphagia Oropharyngeal. NG tube placed but patient accidentally removed overnight 10/2. Speech therapy recommending alternative means of nutrition over the weekend after reevaluation -Consult radiology for replacement of NG tube under fluoro -SLP evaluation next week  Essential hypertension Hypotension while inpatient. Currently controlled. On Coreg and Cozaar which were  held.  Hyponatremia Mild.  Seizure disorder Followed by neurology as an outpatient. Discontinued Depakote and Keppra -Continue Vimpat 100 mg q12 hours per neurology  Paroxysmal atrial fibrillation Rate controlled. On amiodarone and Coreg as an outpatient which is unable to be given today secondary to mental status.  Hyperlipidemia On fenofibrate as an outpatient.  Elevated TSH/Free T4 In setting of amiodarone use. No history of thyroid disorder. No symptoms/signs of thyrotoxicosis. -Cardiology follow-up  Hypomagnesemia Repletion given.  Thrombocytopenia Mild. Unsure of etiology. No evidence of bleeding.  Mitral regurgitation Severe on Transthoracic Echocardiogram  Chronic systolic heart failure Stable. Last EF of 40-45%  DVT prophylaxis: Lovenox Code Status:   Code Status: DNR Family Communication: None at bedside Disposition Plan: Discharge likely to SNF if able to improve swallowing vs continued goals of care. Discharge likely in 3-4 days after trial of tube feeds via NG tube   Consultants:   Palliative care medicine  Procedures:   NG tube placement (9/30)  Antimicrobials:  None   Subjective: No issues  Objective: Vitals:   04/29/20 2013 04/29/20 2330 04/30/20 0433 04/30/20 0448  BP: 115/76 104/77 99/74   Pulse: 96 95 84   Resp: (!) 24 16 18    Temp: 97.7 F (36.5 C) 97.8 F (36.6 C) 98.5 F (36.9 C)   TempSrc: Axillary Oral Axillary   SpO2: 99% 97% 98%   Weight:    59.4 kg  Height:        Intake/Output Summary (Last 24 hours) at 04/30/2020 1053 Last data filed at 04/30/2020 0635 Gross per 24 hour  Intake --  Output 1100 ml  Net -1100 ml   Filed Weights   04/22/20 1549 04/29/20 0451 04/30/20 0448  Weight: 58 kg 60.2 kg 59.4 kg    Examination:  General exam: Appears calm and comfortable Respiratory system: Clear to auscultation.  Respiratory effort normal. Cardiovascular system: S1 & S2 heard, RRR. 2/6 systolic murmur Gastrointestinal  system: Abdomen is nondistended, soft and nontender. No organomegaly or masses felt. Normal bowel sounds heard. Central nervous system: Alert. Answering questions with head shakes and nods. Musculoskeletal: No edema. No calf tenderness Skin: No cyanosis. No rashes Psychiatry: Judgement and insight appear normal.     Data Reviewed: I have personally reviewed following labs and imaging studies  CBC Lab Results  Component Value Date   WBC 5.1 04/26/2020   RBC 3.22 (L) 04/26/2020   HGB 10.5 (L) 04/26/2020   HCT 30.1 (L) 04/26/2020   MCV 93.5 04/26/2020   MCH 32.6 04/26/2020   PLT 148 (L) 04/26/2020   MCHC 34.9 04/26/2020   RDW 14.6 04/26/2020   LYMPHSABS 2.0 04/26/2020   MONOABS 0.6 04/26/2020   EOSABS 0.3 04/26/2020   BASOSABS 0.1 04/26/2020     Last metabolic panel Lab Results  Component Value Date   NA 133 (L) 04/30/2020   K 4.2 04/30/2020   CL 102 04/30/2020   CO2 25 04/30/2020   BUN 10 04/30/2020   CREATININE 0.63 04/30/2020   GLUCOSE 105 (H) 04/30/2020   GFRNONAA >60 04/30/2020   GFRAA >60 04/30/2020   CALCIUM 8.7 (L) 04/30/2020   PHOS 3.6 04/26/2020   PROT 5.6 (L) 04/30/2020   ALBUMIN 2.7 (L) 04/30/2020   LABGLOB 2.7 12/19/2017   AGRATIO 1.6 12/19/2017   BILITOT 0.6 04/30/2020   ALKPHOS 28 (L) 04/30/2020   AST 31 04/30/2020   ALT 11 04/30/2020   ANIONGAP 6 04/30/2020    CBG (last 3)  Recent Labs    04/30/20 0034 04/30/20 0445 04/30/20 0821  GLUCAP 111* 100* 106*     GFR: Estimated Creatinine Clearance: 85.6 mL/min (by C-G formula based on SCr of 0.63 mg/dL).  Coagulation Profile: No results for input(s): INR, PROTIME in the last 168 hours.  Recent Results (from the past 240 hour(s))  Respiratory Panel by RT PCR (Flu A&B, Covid) - Nasopharyngeal Swab     Status: None   Collection Time: 04/22/20  4:04 PM   Specimen: Nasopharyngeal Swab  Result Value Ref Range Status   SARS Coronavirus 2 by RT PCR NEGATIVE NEGATIVE Final    Comment:  (NOTE) SARS-CoV-2 target nucleic acids are NOT DETECTED.  The SARS-CoV-2 RNA is generally detectable in upper respiratoy specimens during the acute phase of infection. The lowest concentration of SARS-CoV-2 viral copies this assay can detect is 131 copies/mL. A negative result does not preclude SARS-Cov-2 infection and should not be used as the sole basis for treatment or other patient management decisions. A negative result may occur with  improper specimen collection/handling, submission of specimen other than nasopharyngeal swab, presence of viral mutation(s) within the areas targeted by this assay, and inadequate number of viral copies (<131 copies/mL). A negative result must be combined with clinical observations, patient history, and epidemiological information. The expected result is Negative.  Fact Sheet for Patients:  https://www.moore.com/  Fact Sheet for Healthcare Providers:  https://www.young.biz/  This test is no t yet approved or cleared by the [place] FDA and  has been authorized for detection and/or diagnosis of SARS-CoV-2 by FDA under an Emergency Use Authorization (EUA). This EUA will remain  in effect (meaning this test can be used) for the duration of the COVID-19 declaration under Section 564(b)(1) of the Act, 21 U.S.C. section 360bbb-3(b)(1), unless the authorization is terminated or revoked sooner.     Influenza A by PCR  NEGATIVE NEGATIVE Final   Influenza B by PCR NEGATIVE NEGATIVE Final    Comment: (NOTE) The Xpert Xpress SARS-CoV-2/FLU/RSV assay is intended as an aid in  the diagnosis of influenza from Nasopharyngeal swab specimens and  should not be used as a sole basis for treatment. Nasal washings and  aspirates are unacceptable for Xpert Xpress SARS-CoV-2/FLU/RSV  testing.  Fact Sheet for Patients: https://www.moore.com/  Fact Sheet for Healthcare  Providers: https://www.young.biz/  This test is not yet approved or cleared by the [place] FDA and  has been authorized for detection and/or diagnosis of SARS-CoV-2 by  FDA under an Emergency Use Authorization (EUA). This EUA will remain  in effect (meaning this test can be used) for the duration of the  Covid-19 declaration under Section 564(b)(1) of the Act, 21  U.S.C. section 360bbb-3(b)(1), unless the authorization is  terminated or revoked. Performed at [facility], [address], [place], [place] [address]         Radiology Studies: No results found.      Scheduled Meds:  amiodarone  200 mg Per Tube Daily   aspirin  81 mg Per Tube Daily   benztropine  0.5 mg Oral BID   carvedilol  3.125 mg Per Tube BID WC   digoxin  0.0625 mg Per Tube Daily   enoxaparin (LOVENOX) injection  40 mg Subcutaneous Q24H   fenofibrate  160 mg Per Tube Daily   ferrous sulfate  220 mg Per Tube Q breakfast   fluticasone  2 spray Each Nare Daily   glycopyrrolate  1 mg Per Tube BID   lactulose  30 g Per Tube Daily   loratadine  10 mg Per Tube Daily   mouth rinse  15 mL Mouth Rinse BID   midodrine  10 mg Per Tube TID WC   multivitamin  15 mL Per Tube Daily   pantoprazole (PROTONIX) IV  40 mg Intravenous Q24H   risperiDONE  0.5 mg Per Tube Daily   risperiDONE  1 mg Per Tube QHS   vitamin B-12  1,000 mcg Per Tube Daily   Continuous Infusions:  sodium chloride Stopped (04/25/20 1535)   famotidine (PEPCID) IV 20 mg (04/29/20 2234)   feeding supplement (OSMOLITE 1.2 CAL) Stopped (04/29/20 1910)   lacosamide (VIMPAT) IV 100 mg (04/29/20 2124)     LOS: 7 days     Jacquelin Hawking, MD [facility] 04/30/2020, 10:53 AM  If 7PM-7AM, please contact night-coverage [email or web address]
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
## 2020-05-01 DIAGNOSIS — Z7189 Other specified counseling: Secondary | ICD-10-CM | POA: Diagnosis not present

## 2020-05-01 DIAGNOSIS — Z66 Do not resuscitate: Secondary | ICD-10-CM | POA: Diagnosis not present

## 2020-05-01 DIAGNOSIS — Z515 Encounter for palliative care: Secondary | ICD-10-CM | POA: Diagnosis not present

## 2020-05-01 DIAGNOSIS — R4182 Altered mental status, unspecified: Secondary | ICD-10-CM | POA: Diagnosis not present

## 2020-05-01 LAB — COMPREHENSIVE METABOLIC PANEL
ALT: 13 U/L (ref 0–44)
AST: 28 U/L (ref 15–41)
Albumin: 3 g/dL — ABNORMAL LOW (ref 3.5–5.0)
Alkaline Phosphatase: 31 U/L — ABNORMAL LOW (ref 38–126)
Anion gap: 7 (ref 5–15)
BUN: 10 mg/dL (ref 6–20)
CO2: 27 mmol/L (ref 22–32)
Calcium: 8.8 mg/dL — ABNORMAL LOW (ref 8.9–10.3)
Chloride: 104 mmol/L (ref 98–111)
Creatinine, Ser: 0.61 mg/dL (ref 0.61–1.24)
GFR calc Af Amer: >60 mL/min (ref >60)
GFR calc non Af Amer: >60 mL/min (ref >60)
Glucose, Bld: 100 mg/dL — ABNORMAL HIGH (ref 70–99)
Potassium: 3.8 mmol/L (ref 3.5–5.1)
Sodium: 138 mmol/L (ref 135–145)
Total Bilirubin: 0.9 mg/dL (ref 0.3–1.2)
Total Protein: 6.2 g/dL — ABNORMAL LOW (ref 6.5–8.1)

## 2020-05-01 LAB — GLUCOSE, CAPILLARY
Glucose-Capillary: 78 mg/dL (ref 70–99)
Glucose-Capillary: 85 mg/dL (ref 70–99)
Glucose-Capillary: 90 mg/dL (ref 70–99)
Glucose-Capillary: 96 mg/dL (ref 70–99)
Glucose-Capillary: 98 mg/dL (ref 70–99)
Glucose-Capillary: 99 mg/dL (ref 70–99)

## 2020-05-01 MED ORDER — LACTULOSE ENEMA
300.0000 mL | Freq: Every day | ORAL | Status: DC
  Administered 2020-05-01: 300 mL via RECTAL
  Filled 2020-05-01 (×2): qty 300

## 2020-05-01 MED ORDER — SODIUM CHLORIDE 0.45 % IV SOLN: INTRAVENOUS | Status: DC

## 2020-05-01 NOTE — Progress Notes (Signed)
MD aware that patient is  NPO and has some oral medications ordered. MD instructed to keep patient NPO not to give oral med's with apple sauce due to risk of aspiration.

## 2020-05-01 NOTE — Progress Notes (Signed)
Mobility Specialist - Progress Note   05/01/20 1400  Mobility  Range of Motion/Exercises All extremities (ankle pumps, quad sets, slr, hip add/abd, crossover reach)  Level of Assistance Minimal assist, patient does 75% or more  Assistive Device None  Distance Ambulated (ft) 0 ft  Mobility Response Tolerated well  Mobility performed by Mobility specialist  $Mobility charge 1 Mobility    Pre-mobility: 94 HR, 96% SpO2 During mobility: 108 HR, 96% SpO2 Post-mobility: 95 HR, 98% SpO2   Pt was lying in bed upon arrival utilizing 2L [place] O2. Pt agreed to session. Pt denied any pain, nausea, or fatigue. Pt was able to perform supine exercises: ankle pumps (12x/leg), quad sets (7x/leg), hip add/abd (10x/leg), and crossover reach (5x/arm). Pt needed minA while performing quad sets and SLR. Pt has impulsive movements while performing LE exercises. Overall, pt tolerated session well. Pt remains in bed with alarm set and all needs in reach.    Filiberto Pinks Mobility Specialist 05/01/20, 3:02 PM

## 2020-05-01 NOTE — Progress Notes (Signed)
PROGRESS NOTE    Raymond [name]  XNT:[id number] DOB: [birth year] DOA: 04/22/2020 PCP: [name]   Brief Narrative: Raymond [name] is a 59 y.o. male coming from [facility] with a known history of seizure disorder, mental retardation, obsessive-compulsive disorder, dyslipidemia and severe mitral regurgitation and CHF. Patient presented secondary to acute altered mental status. Likely related to hyperammonemia in setting of Depakote use.   Assessment & Plan:   Active Problems:   Intellectual disability   Epilepsy (HCC)   Protein-calorie malnutrition, severe   Chronic systolic CHF (congestive heart failure) (HCC)   AMS (altered mental status)   Encephalopathy acute   DNR (do not resuscitate)   Acute metabolic encephalopathy In setting of hyperammoniemia. Imaging has ruled out hepatic source. Likely mediation related in setting of Depakote use. Ammonia continues to rise. CTA head/MRI brain significant for no acute intracranial process and evidence of moderate cerebral atroph/chronic ischemic changes. UDS significant for MDMA possibly falsely positive secondary to medications. Mental status improved today. Depakote and Keppra discontinued. Patient started on Vimpat -Discussed with neurology with recommendations for Vimpat 200 mg load followed by Vimpat 100 mg q12 hours; Continue Vimpat -SLP recommending NPO  Hyperammonemia No cirrhosis noted on RUQ ultrasound obtained 9/29. Downtrended in light of BM. No recent bowel movement -Lactulose enema now that NG tube removed by patient for a second time  Dysphagia Oropharyngeal. NG tube placed but patient accidentally removed overnight 10/1. Speech therapy recommending alternative means of nutrition over the weekend after reevaluation. Patient removed NG tube again overnight 10/2. -SLP evaluation this upcoming week -Will hold on replacing NG tube today -1/2 NS IV fluids while NG tube removed and patient  NPO  Essential hypertension Hypotension while inpatient. Currently controlled. On Coreg and Cozaar which were held.  Hyponatremia Mild. Resolved.  Seizure disorder Followed by neurology as an outpatient. Discontinued Depakote and Keppra -Continue Vimpat 100 mg q12 hours per neurology  Paroxysmal atrial fibrillation Rate controlled. On amiodarone and Coreg as an outpatient which is unable to be given today secondary to mental status.  Hyperlipidemia On fenofibrate as an outpatient.  Elevated TSH/Free T4 In setting of amiodarone use. No history of thyroid disorder. No symptoms/signs of thyrotoxicosis. -Cardiology follow-up  Hypomagnesemia Repletion given.  Thrombocytopenia Mild. Unsure of etiology. No evidence of bleeding. Improved.  Mitral regurgitation Severe on Transthoracic Echocardiogram  Chronic systolic heart failure Stable. Last EF of 40-45%  DVT prophylaxis: Lovenox Code Status:   Code Status: DNR Family Communication: None at bedside Disposition Plan: Discharge likely to SNF if able to improve swallowing vs continued goals of care. Discharge likely in 3-4 days after trial of tube feeds via NG tube   Consultants:   Palliative care medicine  Procedures:   NG tube placement (9/30)  Antimicrobials:  None   Subjective: Sleeping. Pulled out NG tube last night.  Objective: Vitals:   04/30/20 2323 05/01/20 0440 05/01/20 0555 05/01/20 0744  BP: 98/69  103/71 105/69  Pulse: 85  70 73  Resp: 16  20 18   Temp: 98.5 F (36.9 C)  98.4 F (36.9 C) 97.6 F (36.4 C)  TempSrc: Oral   Oral  SpO2: 100%  100% 99%  Weight:  58 kg    Height:        Intake/Output Summary (Last 24 hours) at 05/01/2020 0932 Last data filed at 04/30/2020 1842 Gross per 24 hour  Intake --  Output 800 ml  Net -800 ml   Filed Weights   04/29/20  0451 04/30/20 0448 05/01/20 0440  Weight: 60.2 kg 59.4 kg 58 kg    Examination:  General exam: Appears calm and  comfortable Respiratory system: Clear to auscultation. Respiratory effort normal. Cardiovascular system: S1 & S2 heard, Irregular rhythm, normal rate. 2/6 systolic murmur Gastrointestinal system: Abdomen is nondistended, soft and nontender. No organomegaly or masses felt. Normal bowel sounds heard. Central nervous system: Asleep. Musculoskeletal: No edema. No calf tenderness Skin: No cyanosis. No rashes   Data Reviewed: I have personally reviewed following labs and imaging studies  CBC Lab Results  Component Value Date   WBC 5.1 04/26/2020   RBC 3.22 (L) 04/26/2020   HGB 10.5 (L) 04/26/2020   HCT 30.1 (L) 04/26/2020   MCV 93.5 04/26/2020   MCH 32.6 04/26/2020   PLT 148 (L) 04/26/2020   MCHC 34.9 04/26/2020   RDW 14.6 04/26/2020   LYMPHSABS 2.0 04/26/2020   MONOABS 0.6 04/26/2020   EOSABS 0.3 04/26/2020   BASOSABS 0.1 04/26/2020     Last metabolic panel Lab Results  Component Value Date   NA 138 05/01/2020   K 3.8 05/01/2020   CL 104 05/01/2020   CO2 27 05/01/2020   BUN 10 05/01/2020   CREATININE 0.61 05/01/2020   GLUCOSE 100 (H) 05/01/2020   GFRNONAA >60 05/01/2020   GFRAA >60 05/01/2020   CALCIUM 8.8 (L) 05/01/2020   PHOS 3.6 04/26/2020   PROT 6.2 (L) 05/01/2020   ALBUMIN 3.0 (L) 05/01/2020   LABGLOB 2.7 12/19/2017   AGRATIO 1.6 12/19/2017   BILITOT 0.9 05/01/2020   ALKPHOS 31 (L) 05/01/2020   AST 28 05/01/2020   ALT 13 05/01/2020   ANIONGAP 7 05/01/2020    CBG (last 3)  Recent Labs    05/01/20 0008 05/01/20 0437 05/01/20 0744  GLUCAP 99 96 98     GFR: Estimated Creatinine Clearance: 83.6 mL/min (by C-G formula based on SCr of 0.61 mg/dL).  Coagulation Profile: No results for input(s): INR, PROTIME in the last 168 hours.  Recent Results (from the past 240 hour(s))  Respiratory Panel by RT PCR (Flu A&B, Covid) - Nasopharyngeal Swab     Status: None   Collection Time: 04/22/20  4:04 PM   Specimen: Nasopharyngeal Swab  Result Value Ref Range  Status   SARS Coronavirus 2 by RT PCR NEGATIVE NEGATIVE Final    Comment: (NOTE) SARS-CoV-2 target nucleic acids are NOT DETECTED.  The SARS-CoV-2 RNA is generally detectable in upper respiratoy specimens during the acute phase of infection. The lowest concentration of SARS-CoV-2 viral copies this assay can detect is 131 copies/mL. A negative result does not preclude SARS-Cov-2 infection and should not be used as the sole basis for treatment or other patient management decisions. A negative result may occur with  improper specimen collection/handling, submission of specimen other than nasopharyngeal swab, presence of viral mutation(s) within the areas targeted by this assay, and inadequate number of viral copies (<131 copies/mL). A negative result must be combined with clinical observations, patient history, and epidemiological information. The expected result is Negative.  Fact Sheet for Patients:  https://www.moore.com/  Fact Sheet for Healthcare Providers:  https://www.young.biz/  This test is no t yet approved or cleared by the [place] FDA and  has been authorized for detection and/or diagnosis of SARS-CoV-2 by FDA under an Emergency Use Authorization (EUA). This EUA will remain  in effect (meaning this test can be used) for the duration of the COVID-19 declaration under Section 564(b)(1) of the Act, 21 U.S.C. section 360bbb-3(b)(1),  unless the authorization is terminated or revoked sooner.     Influenza A by PCR NEGATIVE NEGATIVE Final   Influenza B by PCR NEGATIVE NEGATIVE Final    Comment: (NOTE) The Xpert Xpress SARS-CoV-2/FLU/RSV assay is intended as an aid in  the diagnosis of influenza from Nasopharyngeal swab specimens and  should not be used as a sole basis for treatment. Nasal washings and  aspirates are unacceptable for Xpert Xpress SARS-CoV-2/FLU/RSV  testing.  Fact Sheet for  Patients: https://www.moore.com/  Fact Sheet for Healthcare Providers: https://www.young.biz/  This test is not yet approved or cleared by the [place] FDA and  has been authorized for detection and/or diagnosis of SARS-CoV-2 by  FDA under an Emergency Use Authorization (EUA). This EUA will remain  in effect (meaning this test can be used) for the duration of the  Covid-19 declaration under Section 564(b)(1) of the Act, 21  U.S.C. section 360bbb-3(b)(1), unless the authorization is  terminated or revoked. Performed at [facility], [address], [place], [place] [address]         Radiology Studies: DG Basil Dess Tube Plc W/Fl W/Rad  Result Date: 04/30/2020 CLINICAL DATA:  NG tube placement EXAM: NASO G TUBE PLACEMENT WITH FL AND WITH RAD CONTRAST:  None FLUOROSCOPY TIME:  Fluoroscopy Time:  00:12 Number of Acquired Spot Images: 1 COMPARISON:  04/28/2020 FINDINGS: Under fluoroscopic guidance, nasogastric tube was placed through the right nostril and passed into the gastric lumen, tip and side port below the diaphragm. The tube was secured at a depth of 63 cm. IMPRESSION: Successful fluoroscopic placement of nasogastric tube. Electronically Signed   By: Lauralyn Primes M.D.   On: 04/30/2020 15:06        Scheduled Meds: . amiodarone  200 mg Per Tube Daily  . aspirin  81 mg Per Tube Daily  . benztropine  0.5 mg Oral BID  . carvedilol  3.125 mg Per Tube BID WC  . digoxin  0.0625 mg Per Tube Daily  . enoxaparin (LOVENOX) injection  40 mg Subcutaneous Q24H  . fenofibrate  160 mg Per Tube Daily  . ferrous sulfate  220 mg Per Tube Q breakfast  . fluticasone  2 spray Each Nare Daily  . glycopyrrolate  1 mg Per Tube BID  . lactulose  300 mL Rectal Daily  . loratadine  10 mg Per Tube Daily  . mouth rinse  15 mL Mouth Rinse BID  . midodrine  10 mg Per Tube TID WC  . multivitamin  15 mL Per Tube Daily  . pantoprazole (PROTONIX) IV  40  mg Intravenous Q24H  . risperiDONE  0.5 mg Per Tube Daily  . risperiDONE  1 mg Per Tube QHS  . vitamin B-12  1,000 mcg Per Tube Daily   Continuous Infusions: . sodium chloride 75 mL/hr at 05/01/20 0806  . sodium chloride Stopped (04/25/20 1535)  . famotidine (PEPCID) IV 20 mg (04/30/20 2229)  . feeding supplement (OSMOLITE 1.2 CAL) Stopped (04/30/20 1830)  . lacosamide (VIMPAT) IV 100 mg (04/30/20 2339)     LOS: 8 days     Jacquelin Hawking, MD [facility] 05/01/2020, 9:32 AM  If 7PM-7AM, please contact night-coverage [email or web address]

## 2020-05-02 ENCOUNTER — Other Ambulatory Visit: Payer: Self-pay

## 2020-05-02 DIAGNOSIS — E43 Unspecified severe protein-calorie malnutrition: Secondary | ICD-10-CM

## 2020-05-02 DIAGNOSIS — F79 Unspecified intellectual disabilities: Secondary | ICD-10-CM

## 2020-05-02 DIAGNOSIS — R410 Disorientation, unspecified: Secondary | ICD-10-CM

## 2020-05-02 DIAGNOSIS — Z66 Do not resuscitate: Secondary | ICD-10-CM | POA: Diagnosis not present

## 2020-05-02 DIAGNOSIS — I5022 Chronic systolic (congestive) heart failure: Secondary | ICD-10-CM

## 2020-05-02 DIAGNOSIS — Z515 Encounter for palliative care: Secondary | ICD-10-CM | POA: Diagnosis not present

## 2020-05-02 DIAGNOSIS — E7220 Disorder of urea cycle metabolism, unspecified: Secondary | ICD-10-CM | POA: Diagnosis not present

## 2020-05-02 DIAGNOSIS — R4182 Altered mental status, unspecified: Secondary | ICD-10-CM | POA: Diagnosis not present

## 2020-05-02 DIAGNOSIS — E8809 Other disorders of plasma-protein metabolism, not elsewhere classified: Secondary | ICD-10-CM

## 2020-05-02 LAB — COMPREHENSIVE METABOLIC PANEL
ALT: 15 U/L (ref 0–44)
AST: 32 U/L (ref 15–41)
Albumin: 3.1 g/dL — ABNORMAL LOW (ref 3.5–5.0)
Alkaline Phosphatase: 32 U/L — ABNORMAL LOW (ref 38–126)
Anion gap: 14 (ref 5–15)
BUN: 10 mg/dL (ref 6–20)
CO2: 22 mmol/L (ref 22–32)
Calcium: 8.9 mg/dL (ref 8.9–10.3)
Chloride: 103 mmol/L (ref 98–111)
Creatinine, Ser: 0.65 mg/dL (ref 0.61–1.24)
GFR calc Af Amer: >60 mL/min (ref >60)
GFR calc non Af Amer: >60 mL/min (ref >60)
Glucose, Bld: 78 mg/dL (ref 70–99)
Potassium: 3.4 mmol/L — ABNORMAL LOW (ref 3.5–5.1)
Sodium: 139 mmol/L (ref 135–145)
Total Bilirubin: 1.5 mg/dL — ABNORMAL HIGH (ref 0.3–1.2)
Total Protein: 6.2 g/dL — ABNORMAL LOW (ref 6.5–8.1)

## 2020-05-02 LAB — GLUCOSE, CAPILLARY
Glucose-Capillary: 101 mg/dL — ABNORMAL HIGH (ref 70–99)
Glucose-Capillary: 123 mg/dL — ABNORMAL HIGH (ref 70–99)
Glucose-Capillary: 132 mg/dL — ABNORMAL HIGH (ref 70–99)
Glucose-Capillary: 70 mg/dL (ref 70–99)
Glucose-Capillary: 81 mg/dL (ref 70–99)
Glucose-Capillary: 82 mg/dL (ref 70–99)
Glucose-Capillary: 83 mg/dL (ref 70–99)

## 2020-05-02 LAB — CBC
HCT: 33.9 % — ABNORMAL LOW (ref 39.0–52.0)
Hemoglobin: 11.1 g/dL — ABNORMAL LOW (ref 13.0–17.0)
MCH: 31.9 pg (ref 26.0–34.0)
MCHC: 32.7 g/dL (ref 30.0–36.0)
MCV: 97.4 fL (ref 80.0–100.0)
Platelets: 309 10*3/uL (ref 150–400)
RBC: 3.48 MIL/uL — ABNORMAL LOW (ref 4.22–5.81)
RDW: 14.9 % (ref 11.5–15.5)
WBC: 7.6 10*3/uL (ref 4.0–10.5)
nRBC: 0 % (ref 0.0–0.2)

## 2020-05-02 MED ORDER — NEPRO/CARBSTEADY PO LIQD
237.0000 mL | Freq: Three times a day (TID) | ORAL | Status: DC
  Administered 2020-05-02 – 2020-05-03 (×3): 237 mL via ORAL

## 2020-05-02 MED ORDER — ADULT MULTIVITAMIN W/MINERALS CH
1.0000 | ORAL_TABLET | Freq: Every day | ORAL | Status: DC
  Administered 2020-05-03: 1 via ORAL
  Filled 2020-05-02: qty 1

## 2020-05-02 MED ORDER — LACTULOSE 10 GM/15ML PO SOLN
30.0000 g | Freq: Every day | ORAL | Status: DC
  Administered 2020-05-02 – 2020-05-03 (×2): 30 g via ORAL
  Filled 2020-05-02 (×2): qty 60

## 2020-05-02 NOTE — Progress Notes (Signed)
Physical Therapy Treatment Patient Details Name: Raymond [name] MRN: [id number] DOB: January 23, 1961 Today's Date: 05/02/2020    History of Present Illness Raymond [name]  is a 59 y.o. Caucasian male coming from [facility] with a known history of seizure disorder, mental retardation, obsessive-compulsive disorder, dyslipidemia and severe mitral regurgitation and CHF, who presented to the emergency room with acute onset of altered mental status.  He was found on the floor at his SNF.  No reported dysuria, oliguria or hematuria or flank pain.  No reported cough or wheezing or dyspnea.  No reported chest pain or palpitations.  The patient is a very poor historian due to his altered mental status as well as underlying developmental delay.    PT Comments    Pt presents in fowler's position on arrival to room. He is lethargic throughout session, but able to engage with supine exercises. Increased resistance for therapeutic exercises. Attempted to perform bed rolling, but patient declines. Pt left in bed with all needs. He will benefit from skilled PT in order to address deficits in strength and mobility in order to return to PLOF with minimal difficulties.   Follow Up Recommendations  SNF     Equipment Recommendations  None recommended by PT    Recommendations for Other Services       Precautions / Restrictions Precautions Precautions: Fall Restrictions Weight Bearing Restrictions: No    Mobility  Bed Mobility               General bed mobility comments: Did not perform due to patient declining after supine exercises  Transfers                 General transfer comment: Did not attempt today  Ambulation/Gait             General Gait Details: Did not attempt   Stairs             Wheelchair Mobility    Modified Rankin (Stroke Patients Only)       Balance       Sitting balance - Comments: Did not attempt       Standing balance  comment: Did not attempt                            Cognition Arousal/Alertness: Lethargic Behavior During Therapy: Flat affect Overall Cognitive Status: History of cognitive impairments - at baseline                                        Exercises General Exercises - Upper Extremity Elbow Flexion: Strengthening;Both;10 reps;Supine Elbow Extension: Strengthening;Both;10 reps;Supine General Exercises - Lower Extremity Ankle Circles/Pumps: AROM;Both;10 reps;Supine Quad Sets: AROM;Both;10 reps;Supine Gluteal Sets: AROM;Both;10 reps;Supine Heel Slides: AROM;Both;10 reps;Supine Hip ABduction/ADduction: AROM;Both;10 reps;Supine Straight Leg Raises: Both;AAROM;10 reps;Supine Other Exercises Other Exercises: Pt performed 10 shoulder shrugs    General Comments General comments (skin integrity, edema, etc.): Pt has mitts on L hand only on arrival to room, nurse took off his R mitt      Pertinent Vitals/Pain Pain Assessment: No/denies pain    Home Living                      Prior Function            PT Goals (current goals can now be found in  the care plan section) Acute Rehab PT Goals Patient Stated Goal: "to go home" PT Goal Formulation: Patient unable to participate in goal setting Time For Goal Achievement: 05/07/20 Potential to Achieve Goals: Fair Progress towards PT goals: Progressing toward goals    Frequency    Min 2X/week      PT Plan Current plan remains appropriate    Co-evaluation              AM-PAC PT "6 Clicks" Mobility   Outcome Measure  Help needed turning from your back to your side while in a flat bed without using bedrails?: Total Help needed moving from lying on your back to sitting on the side of a flat bed without using bedrails?: Total Help needed moving to and from a bed to a chair (including a wheelchair)?: Total Help needed standing up from a chair using your arms (e.g., wheelchair or bedside  chair)?: Total Help needed to walk in hospital room?: Total Help needed climbing 3-5 steps with a railing? : Total 6 Click Score: 6    End of Session Equipment Utilized During Treatment: Oxygen Activity Tolerance: Patient limited by lethargy Patient left: in bed;with call bell/phone within reach;with bed alarm set Nurse Communication: Mobility status PT Visit Diagnosis: Muscle weakness (generalized) (M62.81);Difficulty in walking, not elsewhere classified (R26.2)     Time: 1497-0263 PT Time Calculation (min) (ACUTE ONLY): 13 min  Charges:                          Katherine Basset, SPT Baker Pierini 05/02/2020, 11:19 AM
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
## 2020-05-02 NOTE — Progress Notes (Signed)
Initial Nutrition Assessment  DOCUMENTATION CODES:   Underweight  INTERVENTION:   Nepro Shake po TID, each supplement provides 425 kcal and 19 grams protein  Magic cup TID with meals, each supplement provides 290 kcal and 9 grams of protein  MVI daily   NUTRITION DIAGNOSIS:   Inadequate oral intake related to dysphagia as evidenced by NPO status. - pt initiated on dysphagia 2/nectar thick diet   GOAL:   Patient will meet greater than or equal to 90% of their needs -progressing   MONITOR:   PO intake, Supplement acceptance, Labs, Weight trends, Skin, I & O's  ASSESSMENT:   59 year old male with history of seizure disorder, intellectual disability, obsessive-compulsive disorder, HLD, severe mitral regurgitation, CHF presented from SNF with after being found on the floor with acute onset of altered mental status.   Pt removed NGT on 10/3. Pt seen by SLP and initiated on a dysphagia 2/nectar thick diet. RD will add supplements and MVI to help pt meet his estimated needs. Pt currently documented to be down ~6lbs since admit; RD will continue to monitor.   Medications reviewed and include: aspirin, lovenox, ferrous sulfate, lactulose, MVI, protonix, B12, NaCl @75ml /hr, pepcid  Labs reviewed: K 3.4(L), tbili 1.5(H) Ammonia 61(H)- 10/2  Diet Order:   Diet Order            DIET DYS 2 Room service appropriate? Yes; Fluid consistency: Nectar Thick  Diet effective now                EDUCATION NEEDS:   No education needs have been identified at this time  Skin:  Skin Assessment: Reviewed RN Assessment  Last BM:  10/3- type 6  Height:   Ht Readings from Last 1 Encounters:  04/22/20 6' (1.829 m)    Weight:   Wt Readings from Last 1 Encounters:  05/02/20 55.4 kg    Ideal Body Weight:  80.9 kg  BMI:  Body mass index is 16.56 kg/m.  Estimated Nutritional Needs:   Kcal:  1900-2200kcal/day  Protein:  95-110g/day  Fluid:  1.7-2.0L/day  07/02/20 MS,  RD, LDN Please refer to [facility] for RD and/or RD on-call/weekend/after hours pager

## 2020-05-02 NOTE — Progress Notes (Signed)
PROGRESS NOTE    Raymond [name]  BBC:[id number] DOB: [birth year] DOA: 04/22/2020 PCP: [name]   Brief Narrative: Raymond [name] is a 59 y.o. male coming from [facility] with a known history of seizure disorder, mental retardation, obsessive-compulsive disorder, dyslipidemia and severe mitral regurgitation and CHF. Patient presented secondary to acute altered mental status. Likely related to hyperammonemia in setting of Depakote use.   Assessment & Plan:   Active Problems:   Intellectual disability   Epilepsy (HCC)   Protein-calorie malnutrition, severe   Chronic systolic CHF (congestive heart failure) (HCC)   AMS (altered mental status)   Encephalopathy acute   DNR (do not resuscitate)   Acute metabolic encephalopathy In setting of hyperammoniemia. Imaging has ruled out hepatic source. Likely mediation related in setting of Depakote use. Ammonia continues to rise. CTA head/MRI brain significant for no acute intracranial process and evidence of moderate cerebral atroph/chronic ischemic changes. UDS significant for MDMA possibly falsely positive secondary to medications. Mental status improved today. Depakote and Keppra discontinued. Patient started on Vimpat -Discussed with neurology with recommendations for Vimpat 200 mg load followed by Vimpat 100 mg q12 hours; Continue Vimpat -SLP recommending NPO; recommendations pending today  Hyperammonemia No cirrhosis noted on RUQ ultrasound obtained 9/29. Downtrended in light of BM. No recent bowel movement -Lactulose enema now that NG tube removed by patient for a second time  Dysphagia Oropharyngeal. NG tube placed but patient accidentally removed overnight 10/1. Speech therapy recommending alternative means of nutrition over the weekend after reevaluation. Patient removed NG tube again overnight 10/2. -SLP evaluation pending today -Will hold on replacing NG tube pending SLP evaluation -1/2 NS IV fluids  while NG tube removed and patient NPO  Essential hypertension Hypotension while inpatient. Currently controlled. On Coreg and Cozaar which were held.  Hyponatremia Mild. Resolved.  Seizure disorder Followed by neurology as an outpatient. Discontinued Depakote and Keppra -Continue Vimpat 100 mg q12 hours per neurology  Paroxysmal atrial fibrillation Rate controlled. On amiodarone and Coreg as an outpatient which is unable to be given today secondary to mental status.  Hyperlipidemia On fenofibrate as an outpatient.  Elevated TSH/Free T4 In setting of amiodarone use. No history of thyroid disorder. No symptoms/signs of thyrotoxicosis. -Cardiology follow-up  Hypomagnesemia Repletion given.  Thrombocytopenia Mild. Unsure of etiology. No evidence of bleeding. Improved.  Mitral regurgitation Severe on Transthoracic Echocardiogram  Chronic systolic heart failure Stable. Last EF of 40-45%. Weight has been steady while on IV fluids.  DVT prophylaxis: Lovenox Code Status:   Code Status: DNR Family Communication: None at bedside Disposition Plan: Discharge likely to SNF if able to improve swallowing vs continued goals of care. Discharge likely in 2-4 days after trial of tube feeds via NG tube   Consultants:   Palliative care medicine  Procedures:   NG tube placement (9/30)  Antimicrobials:  None   Subjective: No issues overnight.  Objective: Vitals:   05/01/20 2025 05/02/20 0002 05/02/20 0456 05/02/20 0824  BP: 112/80 118/79 103/72 111/77  Pulse: 64 96 95 87  Resp: 20 17 20 15   Temp: 98.4 F (36.9 C) 97.8 F (36.6 C) 98.7 F (37.1 C) 97.9 F (36.6 C)  TempSrc:    Oral  SpO2: 90% 97% 100% 98%  Weight:   55.4 kg   Height:        Intake/Output Summary (Last 24 hours) at 05/02/2020 0835 Last data filed at 05/02/2020 0630 Gross per 24 hour  Intake 1912.79 ml  Output 1350  ml  Net 562.79 ml   Filed Weights   04/30/20 0448 05/01/20 0440 05/02/20 0456    Weight: 59.4 kg 58 kg 55.4 kg    Examination:  General exam: Appears calm and comfortable Respiratory system: Clear to auscultation. Respiratory effort normal. Cardiovascular system: S1 & S2 heard, RRR. 2/6 systolic murmur Gastrointestinal system: Abdomen is nondistended, soft and nontender. No organomegaly or masses felt. Normal bowel sounds heard. Central nervous system: Sleeping but easily arouses. Answers questions appropriate with head shakes and nods. Musculoskeletal: No edema. No calf tenderness Skin: No cyanosis. No rashes Psychiatry: Judgement and insight appear normal.    Data Reviewed: I have personally reviewed following labs and imaging studies  CBC Lab Results  Component Value Date   WBC 7.6 05/02/2020   RBC 3.48 (L) 05/02/2020   HGB 11.1 (L) 05/02/2020   HCT 33.9 (L) 05/02/2020   MCV 97.4 05/02/2020   MCH 31.9 05/02/2020   PLT 309 05/02/2020   MCHC 32.7 05/02/2020   RDW 14.9 05/02/2020   LYMPHSABS 2.0 04/26/2020   MONOABS 0.6 04/26/2020   EOSABS 0.3 04/26/2020   BASOSABS 0.1 04/26/2020     Last metabolic panel Lab Results  Component Value Date   NA 139 05/02/2020   K 3.4 (L) 05/02/2020   CL 103 05/02/2020   CO2 22 05/02/2020   BUN 10 05/02/2020   CREATININE 0.65 05/02/2020   GLUCOSE 78 05/02/2020   GFRNONAA >60 05/02/2020   GFRAA >60 05/02/2020   CALCIUM 8.9 05/02/2020   PHOS 3.6 04/26/2020   PROT 6.2 (L) 05/02/2020   ALBUMIN 3.1 (L) 05/02/2020   LABGLOB 2.7 12/19/2017   AGRATIO 1.6 12/19/2017   BILITOT 1.5 (H) 05/02/2020   ALKPHOS 32 (L) 05/02/2020   AST 32 05/02/2020   ALT 15 05/02/2020   ANIONGAP 14 05/02/2020    CBG (last 3)  Recent Labs    05/02/20 0004 05/02/20 0515 05/02/20 0734  GLUCAP 82 81 70     GFR: Estimated Creatinine Clearance: 79.8 mL/min (by C-G formula based on SCr of 0.65 mg/dL).  Coagulation Profile: No results for input(s): INR, PROTIME in the last 168 hours.  Recent Results (from the past 240 hour(s))   Respiratory Panel by RT PCR (Flu A&B, Covid) - Nasopharyngeal Swab     Status: None   Collection Time: 04/22/20  4:04 PM   Specimen: Nasopharyngeal Swab  Result Value Ref Range Status   SARS Coronavirus 2 by RT PCR NEGATIVE NEGATIVE Final    Comment: (NOTE) SARS-CoV-2 target nucleic acids are NOT DETECTED.  The SARS-CoV-2 RNA is generally detectable in upper respiratoy specimens during the acute phase of infection. The lowest concentration of SARS-CoV-2 viral copies this assay can detect is 131 copies/mL. A negative result does not preclude SARS-Cov-2 infection and should not be used as the sole basis for treatment or other patient management decisions. A negative result may occur with  improper specimen collection/handling, submission of specimen other than nasopharyngeal swab, presence of viral mutation(s) within the areas targeted by this assay, and inadequate number of viral copies (<131 copies/mL). A negative result must be combined with clinical observations, patient history, and epidemiological information. The expected result is Negative.  Fact Sheet for Patients:  https://www.moore.com/  Fact Sheet for Healthcare Providers:  https://www.young.biz/  This test is no t yet approved or cleared by the [place] FDA and  has been authorized for detection and/or diagnosis of SARS-CoV-2 by FDA under an Emergency Use Authorization (EUA). This EUA will  remain  in effect (meaning this test can be used) for the duration of the COVID-19 declaration under Section 564(b)(1) of the Act, 21 U.S.C. section 360bbb-3(b)(1), unless the authorization is terminated or revoked sooner.     Influenza A by PCR NEGATIVE NEGATIVE Final   Influenza B by PCR NEGATIVE NEGATIVE Final    Comment: (NOTE) The Xpert Xpress SARS-CoV-2/FLU/RSV assay is intended as an aid in  the diagnosis of influenza from Nasopharyngeal swab specimens and  should not be used as  a sole basis for treatment. Nasal washings and  aspirates are unacceptable for Xpert Xpress SARS-CoV-2/FLU/RSV  testing.  Fact Sheet for Patients: https://www.moore.com/  Fact Sheet for Healthcare Providers: https://www.young.biz/  This test is not yet approved or cleared by the [place] FDA and  has been authorized for detection and/or diagnosis of SARS-CoV-2 by  FDA under an Emergency Use Authorization (EUA). This EUA will remain  in effect (meaning this test can be used) for the duration of the  Covid-19 declaration under Section 564(b)(1) of the Act, 21  U.S.C. section 360bbb-3(b)(1), unless the authorization is  terminated or revoked. Performed at [facility], [address] [place], [place] [address]         Radiology Studies: DG Basil Dess Tube Plc W/Fl W/Rad  Result Date: 04/30/2020 CLINICAL DATA:  NG tube placement EXAM: NASO G TUBE PLACEMENT WITH FL AND WITH RAD CONTRAST:  None FLUOROSCOPY TIME:  Fluoroscopy Time:  00:12 Number of Acquired Spot Images: 1 COMPARISON:  04/28/2020 FINDINGS: Under fluoroscopic guidance, nasogastric tube was placed through the right nostril and passed into the gastric lumen, tip and side port below the diaphragm. The tube was secured at a depth of 63 cm. IMPRESSION: Successful fluoroscopic placement of nasogastric tube. Electronically Signed   By: Lauralyn Primes M.D.   On: 04/30/2020 15:06        Scheduled Meds: . amiodarone  200 mg Per Tube Daily  . aspirin  81 mg Per Tube Daily  . benztropine  0.5 mg Oral BID  . carvedilol  3.125 mg Per Tube BID WC  . digoxin  0.0625 mg Per Tube Daily  . enoxaparin (LOVENOX) injection  40 mg Subcutaneous Q24H  . fenofibrate  160 mg Per Tube Daily  . ferrous sulfate  220 mg Per Tube Q breakfast  . fluticasone  2 spray Each Nare Daily  . glycopyrrolate  1 mg Per Tube BID  . lactulose  300 mL Rectal Daily  . loratadine  10 mg Per Tube Daily  . mouth  rinse  15 mL Mouth Rinse BID  . midodrine  10 mg Per Tube TID WC  . multivitamin  15 mL Per Tube Daily  . pantoprazole (PROTONIX) IV  40 mg Intravenous Q24H  . risperiDONE  0.5 mg Per Tube Daily  . risperiDONE  1 mg Per Tube QHS  . vitamin B-12  1,000 mcg Per Tube Daily   Continuous Infusions: . sodium chloride 75 mL/hr at 05/02/20 0630  . sodium chloride Stopped (04/25/20 1535)  . famotidine (PEPCID) IV Stopped (05/01/20 2324)  . feeding supplement (OSMOLITE 1.2 CAL) Stopped (04/30/20 1830)  . lacosamide (VIMPAT) IV Stopped (05/01/20 2240)     LOS: 9 days     Jacquelin Hawking, MD [facility] 05/02/2020, 8:35 AM  If 7PM-7AM, please contact night-coverage [email or web address]

## 2020-05-03 DIAGNOSIS — G9341 Metabolic encephalopathy: Secondary | ICD-10-CM | POA: Diagnosis not present

## 2020-05-03 LAB — GLUCOSE, CAPILLARY
Glucose-Capillary: 132 mg/dL — ABNORMAL HIGH (ref 70–99)
Glucose-Capillary: 135 mg/dL — ABNORMAL HIGH (ref 70–99)
Glucose-Capillary: 144 mg/dL — ABNORMAL HIGH (ref 70–99)
Glucose-Capillary: 95 mg/dL (ref 70–99)

## 2020-05-03 LAB — COMPREHENSIVE METABOLIC PANEL
ALT: 17 U/L (ref 0–44)
AST: 36 U/L (ref 15–41)
Albumin: 3 g/dL — ABNORMAL LOW (ref 3.5–5.0)
Alkaline Phosphatase: 36 U/L — ABNORMAL LOW (ref 38–126)
Anion gap: 8 (ref 5–15)
BUN: 10 mg/dL (ref 6–20)
CO2: 24 mmol/L (ref 22–32)
Calcium: 8.6 mg/dL — ABNORMAL LOW (ref 8.9–10.3)
Chloride: 103 mmol/L (ref 98–111)
Creatinine, Ser: 0.61 mg/dL (ref 0.61–1.24)
GFR calc Af Amer: >60 mL/min (ref >60)
GFR calc non Af Amer: >60 mL/min (ref >60)
Glucose, Bld: 128 mg/dL — ABNORMAL HIGH (ref 70–99)
Potassium: 3.3 mmol/L — ABNORMAL LOW (ref 3.5–5.1)
Sodium: 135 mmol/L (ref 135–145)
Total Bilirubin: 0.5 mg/dL (ref 0.3–1.2)
Total Protein: 5.9 g/dL — ABNORMAL LOW (ref 6.5–8.1)

## 2020-05-03 LAB — RESPIRATORY PANEL BY RT PCR (FLU A&B, COVID)
Influenza A by PCR: NEGATIVE
Influenza B by PCR: NEGATIVE
SARS Coronavirus 2 by RT PCR: NEGATIVE

## 2020-05-03 MED ORDER — FERROUS SULFATE 220 (44 FE) MG/5ML PO ELIX
220.0000 mg | ORAL_SOLUTION | Freq: Every day | ORAL | Status: DC
  Filled 2020-05-03: qty 5

## 2020-05-03 MED ORDER — AMIODARONE HCL 200 MG PO TABS: 200.0000 mg | ORAL_TABLET | Freq: Every day | ORAL | Status: DC

## 2020-05-03 MED ORDER — LACOSAMIDE 100 MG PO TABS: 100.0000 mg | ORAL_TABLET | Freq: Two times a day (BID) | ORAL | 0 refills | Status: AC

## 2020-05-03 MED ORDER — ASPIRIN 81 MG PO CHEW: 81.0000 mg | CHEWABLE_TABLET | Freq: Every day | ORAL | Status: DC

## 2020-05-03 MED ORDER — RISPERIDONE 1 MG PO TABS
1.0000 mg | ORAL_TABLET | Freq: Every day | ORAL | Status: DC
  Filled 2020-05-03: qty 1

## 2020-05-03 MED ORDER — LORATADINE 10 MG PO TABS: 10.0000 mg | ORAL_TABLET | Freq: Every day | ORAL | Status: DC

## 2020-05-03 MED ORDER — RISPERIDONE 0.5 MG PO TABS: 0.5000 mg | ORAL_TABLET | Freq: Every day | ORAL | Status: DC

## 2020-05-03 MED ORDER — CARVEDILOL 6.25 MG PO TABS: 3.1250 mg | ORAL_TABLET | Freq: Two times a day (BID) | ORAL | Status: DC

## 2020-05-03 MED ORDER — FENOFIBRATE 160 MG PO TABS: 160.0000 mg | ORAL_TABLET | Freq: Every day | ORAL | Status: DC

## 2020-05-03 MED ORDER — PANTOPRAZOLE SODIUM 40 MG PO PACK: 40.0000 mg | PACK | Freq: Every day | ORAL | Status: DC

## 2020-05-03 MED ORDER — MIDODRINE HCL 5 MG PO TABS
10.0000 mg | ORAL_TABLET | Freq: Three times a day (TID) | ORAL | Status: DC
  Filled 2020-05-03 (×2): qty 2

## 2020-05-03 MED ORDER — LACOSAMIDE 50 MG PO TABS: 100.0000 mg | ORAL_TABLET | Freq: Two times a day (BID) | ORAL | Status: DC

## 2020-05-03 MED ORDER — LACTULOSE 10 GM/15ML PO SOLN: 30.0000 g | Freq: Every day | ORAL | Status: AC

## 2020-05-03 MED ORDER — FAMOTIDINE 40 MG/5ML PO SUSR
20.0000 mg | Freq: Every day | ORAL | Status: DC
  Filled 2020-05-03: qty 2.5

## 2020-05-03 MED ORDER — GLYCOPYRROLATE 1 MG PO TABS
1.0000 mg | ORAL_TABLET | Freq: Two times a day (BID) | ORAL | Status: DC
  Filled 2020-05-03: qty 1

## 2020-05-03 MED ORDER — DIGOXIN 125 MCG PO TABS: 0.0625 mg | ORAL_TABLET | Freq: Every day | ORAL | Status: DC

## 2020-05-03 NOTE — Progress Notes (Signed)
Mobility Specialist - Progress Note   05/03/20 1400  Mobility  Activity Stood at bedside  Range of Motion/Exercises Right leg;Left leg (straight leg raises)  Level of Assistance Moderate assist, patient does 50-74%  Assistive Device Front wheel walker  Distance Ambulated (ft) 0 ft  Mobility Response Tolerated well  Mobility performed by Mobility specialist  $Mobility charge 1 Mobility    Pre-mobility: 89 HR, 97% SpO2 During mobility: 92 HR, 98% SpO2 Post-mobility: 118 HR, 100% SPO2   Pt was lying in bed upon arrival. Pt agreed to session. Pt was able to perform supine exercises: straight leg raises with CGA for control. Pt got to EOB with SBA and was able to stand to RW with modA. Pt peformed standing march 10 secs before needing to sit. Overall, pt tolerated session well. Pt was left in bed with all needs in reach.    Filiberto Pinks Mobility Specialist 05/03/20, 2:14 PM

## 2020-05-03 NOTE — Discharge Summary (Signed)
Physician Discharge Summary  Raymond [name] AST:[id number] DOB: 20-May-1961 DOA: 04/22/2020  PCP: [name]  Admit date: 04/22/2020 Discharge date: 05/03/2020  Admitted From: SNF Disposition: SNF  Recommendations for Outpatient Follow-up:  1. Follow up with PCP in 1 week 2. Palliative care referral 3. Neurology follow-up 4. Cardiology follow-up for amiodarone use in setting of abnormal thyroid function tests 5. Please follow up on the following pending results: None  Home Health: SNF Equipment/Devices: None  Discharge Condition: Stable CODE STATUS: DNR Diet recommendation:   Dysphagia 2 (fine chop);Nectar-thick liquid Liquids provided via: Cup Medication Administration: Crushed with puree Supervision: Staff to assist with self feeding;Full supervision/cueing for compensatory strategies Compensations: Minimize environmental distractions;Slow rate;Small sips/bites Postural Changes and/or Swallow Maneuvers: Out of bed for meals;Seated upright 90 degrees  Brief/Interim Summary:  Admission HPI written by Valente David, MD   HISTORY OF PRESENT ILLNESS: Raymond [name]  is a 59 y.o. Caucasian male coming from [facility] with a known history of seizure disorder, mental retardation, obsessive-compulsive disorder, dyslipidemia and severe mitral regurgitation and CHF, who presented to the emergency room with acute onset of altered mental status.  He was found on the floor at his SNF.  No reported dysuria, oliguria or hematuria or flank pain.  No reported cough or wheezing or dyspnea.  No reported chest pain or palpitations.  The patient is a very poor historian due to his altered mental status as well as underlying developmental delay.  Upon presentation to emergency room, blood pressure was 88/61 with otherwise normal vital signs.  It has come up to 102/75 with hydration and later on was down to 88/62.  Labs revealed mild hyponatremia 132 and albumin of 2.9 with  total protein of 6.1, total bili of 1.3.  Ammonia level was elevated at 52.  High-sensitivity troponin was 10 and lactic acid 1.  CBC showed mild anemia and TSH came back 1.8.  Respiratory panel including COVID-19 PCR came back negative.  Urinalysis was unremarkable.  Urine drug screen was positive for MDMA.  He had head and neck CT scan revealing mild bilateral intracavernous ICA narrowing secondary to atherosclerotic disease with no large vessel occlusion or high-grade narrowing or dissection or aneurysm.  KG showed atrial fibrillation with controlled ventricular response of 97 with borderline intraventricular conduction delay and T wave inversion laterally.  The patient was given 1 L bolus of IV lactated Ringer and 1 g of IV Keppra.  He will be admitted to an observation medically monitored bed for further evaluation and management.   Hospital course:  Acute metabolic encephalopathy In setting of hyperammoniemia. Imaging has ruled out hepatic source. Likely mediation related in setting of Depakote use. Ammonia continues to rise. CTA head/MRI brain significant for no acute intracranial process and evidence of moderate cerebral atroph/chronic ischemic changes. UDS significant for MDMA possibly falsely positive secondary to medications. Mental status improved today. Depakote and Keppra discontinued. Patient started on Vimpat.  Hyperammonemia No cirrhosis noted on RUQ ultrasound obtained 9/29. Downtrended in light of BM. Patient managed with lactulose enemas while NPO and without NG tube. He was managed on Lactulose by mouth when able to take PO and with NG tube. Discharge with oral lactulose. Reevaluate to see if lactulose therapy can be discontinued.  Dysphagia Oropharyngeal. NG tube placed but patient accidentally removed overnight 10/1. Speech therapy recommending alternative means of nutrition over the weekend after reevaluation. Patient removed NG tube again overnight 10/2. Speech therapy  evaluated and placed patient on dysphagia  2 diet. Recommendations above.  Essential hypertension Hypotension while inpatient. Currently controlled. On Coreg and Cozaar which were held.  Hyponatremia Mild. Resolved.  Seizure disorder Followed by neurology as an outpatient. Discontinued Depakote and Keppra. Patient had previously discontinued Tegretol. Patient started on Vimpat inpatient with recommendations to continue Vimpat 100 mg BID hours per neurology  Paroxysmal atrial fibrillation Rate controlled. On amiodarone and Coreg as an outpatient which is unable to be given today secondary to mental status.  Hyperlipidemia On fenofibrate as an outpatient.  Elevated TSH/Free T4 In setting of amiodarone use. No history of thyroid disorder. No symptoms/signs of thyrotoxicosis. Cardiology follow-up.  Hypomagnesemia Repletion given.  Thrombocytopenia Mild. Unsure of etiology. No evidence of bleeding. Improved.  Mitral regurgitation Severe on Transthoracic Echocardiogram  Chronic systolic heart failure Stable. Last EF of 40-45%. Weight has been steady while on IV fluids. Losartan held secondary to hypotension.  Discharge Diagnoses:  Principal Problem:   Acute metabolic encephalopathy Active Problems:   Intellectual disability   Epilepsy (HCC)   Protein-calorie malnutrition, severe   Chronic systolic CHF (congestive heart failure) (HCC)   AMS (altered mental status)   Encephalopathy acute   DNR (do not resuscitate)    Discharge Instructions   Allergies as of 05/03/2020   No Known Allergies     Medication List    STOP taking these medications   BENEFIBER PLUS CALCIUM PO   BENEFIBER PO   carbamazepine 200 MG tablet Commonly known as: TEGRETOL   divalproex 500 MG DR tablet Commonly known as: DEPAKOTE   levETIRAcetam 1000 MG tablet Commonly known as: KEPPRA   levETIRAcetam 750 MG tablet Commonly known as: KEPPRA   losartan 25 MG tablet Commonly  known as: COZAAR     TAKE these medications   albuterol 108 (90 Base) MCG/ACT inhaler Commonly known as: VENTOLIN HFA Inhale 2 puffs into the lungs every 6 (six) hours as needed for wheezing or shortness of breath.   amiodarone 200 MG tablet Commonly known as: PACERONE Take 1 tablet (200 mg total) by mouth daily. Start taking this dosage on March 16, 2020   aspirin EC 81 MG tablet Take 81 mg by mouth daily.   benztropine 0.5 MG tablet Commonly known as: COGENTIN Take 0.5 mg by mouth 2 (two) times daily.   carvedilol 3.125 MG tablet Commonly known as: COREG Take 3.125 mg by mouth 2 (two) times daily with a meal.   Digoxin 62.5 MCG Tabs Take 0.0625 mg by mouth daily.   ENSURE PO Take 1 Can by mouth daily.   famotidine 40 MG tablet Commonly known as: PEPCID Take 40 mg by mouth at bedtime.   fenofibrate 145 MG tablet Commonly known as: TRICOR TAKE 1 TABLET BY MOUTH ONCE DAILY FOR LIPIDS. What changed: See the new instructions.   fluticasone 50 MCG/ACT nasal spray Commonly known as: FLONASE Place 2 sprays into both nostrils daily.   glycopyrrolate 1 MG tablet Commonly known as: ROBINUL Take 1 mg by mouth 2 (two) times daily.   Lacosamide 100 MG Tabs Take 1 tablet (100 mg total) by mouth 2 (two) times daily.   lactulose 10 GM/15ML solution Commonly known as: CHRONULAC Take 45 mLs (30 g total) by mouth daily. Start taking on: May 04, 2020   loratadine 10 MG tablet Commonly known as: CLARITIN Take 10 mg by mouth daily.   meclizine 25 MG tablet Commonly known as: ANTIVERT Take 25 mg by mouth 3 (three) times daily as needed for dizziness.   midodrine  10 MG tablet Commonly known as: PROAMATINE Take 10 mg by mouth 3 (three) times daily.   multivitamin with minerals Tabs tablet Take 1 tablet by mouth daily.   omeprazole 40 MG capsule Commonly known as: PRILOSEC Take 40 mg by mouth daily.   risperiDONE 1 MG tablet Commonly known as: RISPERDAL Take 1  tablet (1 mg total) by mouth at bedtime.   risperiDONE 0.5 MG tablet Commonly known as: RISPERDAL Take 1 tablet (0.5 mg total) by mouth daily.   VITAMIN B 12 PO Take 1,000 mcg by mouth daily.   Vitamin D 50 MCG (2000 UT) Caps Take 2,000 Units by mouth daily.   VITAMIN D2 PO Take 1.25 mg by mouth once a week.       No Known Allergies  Consultations:  Palliative care medicine   Procedures/Studies: CT Angio Head W or Wo Contrast  Result Date: 04/22/2020 CLINICAL DATA:  Neuro deficit EXAM: CT ANGIOGRAPHY HEAD TECHNIQUE: Multidetector CT imaging of the head was performed using the standard protocol during bolus administration of intravenous contrast. Multiplanar CT image reconstructions and MIPs were obtained to evaluate the vascular anatomy. CONTRAST:  37mL OMNIPAQUE IOHEXOL 350 MG/ML SOLN COMPARISON:  03/26/2020 MRI head and prior.  03/23/2020 head CT. FINDINGS: CT HEAD Brain: No acute infarct or intracranial hemorrhage. Moderate cerebral atrophy with ex vacuo dilatation. Scattered and confluent hypodense foci involving the periventricular and subcortical white matter nonspecific however commonly associated with chronic microvascular ischemic changes. No mass lesion. No midline shift, ventriculomegaly or extra-axial fluid collection. Vascular: No hyperdense vessel or unexpected calcification. Bilateral skull base atherosclerotic calcifications. Skull: Negative for fracture or focal lesion. Sinuses/Orbits: Normal orbits. Clear paranasal sinuses. No mastoid effusion. Other: None. CTA HEAD Anterior circulation: Left A1 segment hypoplasia. Bilateral anterior and middle cerebral arteries are otherwise unremarkable. Bilateral carotid siphon atherosclerotic calcifications with mild narrowing of the intracavernous ICAs. Patent and normal caliber bilateral P comms. No significant stenosis, proximal occlusion, aneurysm, or vascular malformation. Posterior circulation: Dominant left vertebral artery.  Patent normal caliber basilar, superior cerebellar and posterior cerebral arteries. No significant stenosis, proximal occlusion, aneurysm, or vascular malformation. Venous sinuses: As permitted by contrast timing, patent. Anatomic variants: None. IMPRESSION: CT HEAD: No acute intracranial process. Moderate cerebral atrophy and chronic microvascular ischemic changes. CTA HEAD: Mild bilateral intracavernous ICA narrowing secondary to atherosclerotic disease. No large vessel occlusion, high-grade narrowing, dissection or aneurysm. Electronically Signed   By: Stana Bunting M.D.   On: 04/22/2020 19:53   DG Chest 1 View  Result Date: 04/25/2020 CLINICAL DATA:  Confusion EXAM: CHEST  1 VIEW COMPARISON:  04/22/2020, CT chest 03/24/2020, chest radiograph 03/23/2020 FINDINGS: Moderate cardiomegaly with mild central vascular congestion. No pleural effusion. Possible patchy right infrahilar opacity. No pneumothorax. IMPRESSION: Cardiomegaly with mild central vascular congestion. Patchy right infrahilar opacity may reflect atelectasis versus small pneumonia. Electronically Signed   By: Jasmine Pang M.D.   On: 04/25/2020 17:17   DG Chest 1 View  Result Date: 04/22/2020 CLINICAL DATA:  Altered mental status. EXAM: CHEST  1 VIEW COMPARISON:  03/23/2020 FINDINGS: The heart is enlarged but stable. Stable tortuosity and calcification of the thoracic aorta. No infiltrates, edema or effusions. No pulmonary lesions or pneumothorax. The bony thorax is intact. IMPRESSION: 1. Stable cardiac enlargement. 2. No acute pulmonary findings. Electronically Signed   By: Rudie Meyer M.D.   On: 04/22/2020 16:35   DG Abd 1 View  Result Date: 04/27/2020 CLINICAL DATA:  NG tube placement EXAM: ABDOMEN - 1 VIEW  COMPARISON:  03/23/2020 chest x-ray FINDINGS: NG tube loops in the lower cervical region. Mild cardiomegaly. Diffuse interstitial prominence. Bibasilar atelectasis or infiltrates. IMPRESSION: NG tube loops in the lower neck.  Recommend complete removal and replacement. Diffuse interstitial prominence with bibasilar atelectasis or infiltrates. Electronically Signed   By: Charlett Nose M.D.   On: 04/27/2020 16:42   CT Cervical Spine Wo Contrast  Result Date: 04/22/2020 CLINICAL DATA:  Altered mental status EXAM: CT CERVICAL SPINE WITHOUT CONTRAST TECHNIQUE: Multidetector CT imaging of the cervical spine was performed without intravenous contrast. Multiplanar CT image reconstructions were also generated. COMPARISON:  None. FINDINGS: Alignment: Straightening of lordosis.  No listhesis. Skull base and vertebrae: No acute fracture. No primary bone lesion or focal pathologic process. Soft tissues and spinal canal: No prevertebral fluid or swelling. No visible canal hematoma. Disc levels: Multilevel endplate degenerative changes including bridging osteophytosis and disc space loss most prominent at the C6-7 level. Patent bony spinal canal. Moderate right C6-7 bony neural foraminal narrowing secondary to uncovertebral and facet hypertrophy. Upper chest: Clear lung apices. Other: None. IMPRESSION: No acute osseous abnormality or traumatic listhesis. Multilevel spondylosis. Moderate right C6-7 bony neural foraminal narrowing. Electronically Signed   By: Stana Bunting M.D.   On: 04/22/2020 20:45   MR BRAIN WO CONTRAST  Result Date: 04/27/2020 CLINICAL DATA:  Encephalopathy EXAM: MRI HEAD WITHOUT CONTRAST TECHNIQUE: Multiplanar, multiecho pulse sequences of the brain and surrounding structures were obtained without intravenous contrast. COMPARISON:  03/26/2020 FINDINGS: Brain: No acute infarct, acute hemorrhage or extra-axial collection. Multifocal hyperintense T2-weighted signal within the white matter. There is generalized atrophy without lobar predilection. No chronic microhemorrhage. Normal midline structures. Vascular: Normal flow voids. Skull and upper cervical spine: Normal marrow signal. Sinuses/Orbits: Negative. Other: None.  IMPRESSION: 1. No acute intracranial abnormality. 2. Generalized atrophy and findings of chronic small vessel disease. Electronically Signed   By: Deatra Robinson M.D.   On: 04/27/2020 02:04   DG Pelvis Portable  Result Date: 04/22/2020 CLINICAL DATA:  Pelvic pain. EXAM: PORTABLE PELVIS 1-2 VIEWS COMPARISON:  None. FINDINGS: Both hips are normally located. No definite hip fractures. The pubic symphysis and SI joints are intact. No definite pelvic fractures. IMPRESSION: No acute bony findings. Electronically Signed   By: Rudie Meyer M.D.   On: 04/22/2020 16:36   DG Vangie Bicker G Tube Plc W/Fl W/Rad  Result Date: 04/30/2020 CLINICAL DATA:  NG tube placement EXAM: NASO G TUBE PLACEMENT WITH FL AND WITH RAD CONTRAST:  None FLUOROSCOPY TIME:  Fluoroscopy Time:  00:12 Number of Acquired Spot Images: 1 COMPARISON:  04/28/2020 FINDINGS: Under fluoroscopic guidance, nasogastric tube was placed through the right nostril and passed into the gastric lumen, tip and side port below the diaphragm. The tube was secured at a depth of 63 cm. IMPRESSION: Successful fluoroscopic placement of nasogastric tube. Electronically Signed   By: Lauralyn Primes M.D.   On: 04/30/2020 15:06   DG Basil Dess Tube Plc W/Fl W/Rad  Result Date: 04/28/2020 CLINICAL DATA:  NG tube placement. EXAM: NASO G TUBE PLACEMENT WITH FL AND WITH RAD CONTRAST:  None. FLUOROSCOPY TIME:  Fluoroscopy Time:  0 minutes 54 seconds Radiation Exposure Index (if provided by the fluoroscopic device): 2.7 mGy Scratch COMPARISON:  Abdomen 04/27/2020. FINDINGS: Under fluoroscopic guidance NG tube was placed with tip placed into the stomach. There were no complications. IMPRESSION: Successful fluoroscopically directed NG tube placement. Electronically Signed   By: Maisie Fus  Register   On: 04/28/2020 08:55   US Abdomen Limited  RUQ  Result Date: 04/27/2020 CLINICAL DATA:  Elevated ammonia levels EXAM: ULTRASOUND ABDOMEN LIMITED RIGHT UPPER QUADRANT COMPARISON:  None. FINDINGS:  Gallbladder: Gallbladder is well distended with gallbladder sludge within. No wall thickening or pericholecystic fluid is noted. Common bile duct: Diameter: 3.5 mm. Liver: No focal lesion identified. Within normal limits in parenchymal echogenicity. Portal vein is patent on color Doppler imaging with normal direction of blood flow towards the liver. Other: Right-sided pleural effusion is noted. IMPRESSION: Gallbladder sludge without complicating factors. Right-sided pleural fluid Electronically Signed   By: Alcide Clever M.D.   On: 04/27/2020 09:43      Subjective: No concerns today.  Discharge Exam: Vitals:   05/03/20 0405 05/03/20 0800  BP: 94/77 93/72  Pulse: 85 83  Resp: 16   Temp: 98.6 F (37 C) 97.6 F (36.4 C)  SpO2: 98% 99%   Vitals:   05/02/20 1952 05/02/20 2321 05/03/20 0405 05/03/20 0800  BP: 110/81 90/65 94/77  93/72  Pulse: (!) 106 82 85 83  Resp: Temp: 98.6 F (37 C) 97.8 F (36.6 C) 98.6 F (37 C) 97.6 F (36.4 C)  TempSrc: Oral Oral Oral Oral  SpO2: 99% 97% 98% 99%  Weight:      Height:        General: Pt is alert, awake, not in acute distress Cardiovascular: Irregular rhythm, 2/6 systolic murmur, Z6/X0 +, no rubs, no gallops Respiratory: CTA bilaterally, no wheezing, no rhonchi Abdominal: Soft, NT, ND, bowel sounds + Extremities: no edema, no cyanosis    The results of significant diagnostics from this hospitalization (including imaging, microbiology, ancillary and laboratory) are listed below for reference.     Microbiology: No results found for this or any previous visit (from the past 240 hour(s)).   Labs: BNP (last 3 results) Recent Labs    03/01/20 0622 03/23/20 2200 03/25/20 1109  BNP 550.7* 414.7* 601.1*   Basic Metabolic Panel: Recent Labs  Lab 04/29/20 0420 04/30/20 0554 05/01/20 0508 05/02/20 0427 05/03/20 0539  NA 138 133* 138 139 135  K 3.5 4.2 3.8 3.4* 3.3*  CL 106 102 104 103 103  CO2 GLUCOSE  149* 105* 100* 78 128*  BUN CREATININE 0.79 0.63 0.61 0.65 0.61  CALCIUM 8.4* 8.7* 8.8* 8.9 8.6*   Liver Function Tests: Recent Labs  Lab 04/29/20 0420 04/30/20 0554 05/01/20 0508 05/02/20 0427 05/03/20 0539  AST 32 36  ALT ALKPHOS 27* 28* 31* 32* 36*  BILITOT 0.5 0.6 0.9 1.5* 0.5  PROT 5.3* 5.6* 6.2* 6.2* 5.9*  ALBUMIN 2.6* 2.7* 3.0* 3.1* 3.0*   Recent Labs  Lab 04/27/20 0352 04/28/20 0502 04/30/20 0554  AMMONIA 95* 50* 61*   CBC: Recent Labs  Lab 05/02/20 0427  WBC 7.6  HGB 11.1*  HCT 33.9*  MCV 97.4  PLT 309   CBG: Recent Labs  Lab 05/02/20 1625 05/02/20 2001 05/02/20 2322 05/03/20 0401 05/03/20 0743  GLUCAP 101* 123* 132* 132* 135*   Urinalysis    Component Value Date/Time   COLORURINE AMBER (A) 04/22/2020 1626   APPEARANCEUR HAZY (A) 04/22/2020 1626   APPEARANCEUR Clear 12/19/2017 1424   LABSPEC 1.025 04/22/2020 1626   PHURINE 5.0 04/22/2020 1626   GLUCOSEU NEGATIVE 04/22/2020 1626   HGBUR NEGATIVE 04/22/2020 1626   BILIRUBINUR SMALL (A) 04/22/2020 1626   BILIRUBINUR Negative 12/19/2017 1424   KETONESUR  NEGATIVE 04/22/2020 1626   PROTEINUR 30 (A) 04/22/2020 1626   NITRITE NEGATIVE 04/22/2020 1626   LEUKOCYTESUR NEGATIVE 04/22/2020 1626    Time coordinating discharge: 35 minutes  SIGNED:   Jacquelin Hawkingalph Jayleen Afonso, MD [facility] 05/03/2020, 10:12 AM

## 2020-05-03 NOTE — TOC Transition Note (Signed)
Transition of Care [facility]) - CM/SW Discharge Note   Patient Details  Name: CAMREN LIPSETT MRN: [id number] Date of Birth: [birth year]  [facility] CM/SW Contact:  Chapman Fitch, RN Phone Number: 05/03/2020, 3:22 PM   Clinical Narrative:     Patient to return to [facility] today  Guardian [name] notified and faxed DC summary  EMS packed [facility] transport called Bedside RN to call report   Final next level of care: Skilled Nursing Facility Barriers to Discharge: No Barriers Identified   Patient Goals and CMS Choice        Discharge Placement                Patient to be transferred to facility by: EMS Name of family member notified: Gaurdian - [name] Patient and family notified of of transfer: 05/03/20  Discharge Plan and Services                                     Social Determinants of Health (SDOH) Interventions     Readmission Risk Interventions Readmission Risk Prevention Plan 05/03/2020 05/02/2020  Transportation Screening - Complete  Medication Review [facility]) - Complete  PCP or Specialist appointment within 3-5 days of discharge (No Data) -  HRI or Home Care Consult - (No Data)  Palliative Care Screening Not Complete -  Skilled Nursing Facility Complete -  Some recent data might be hidden

## 2020-05-05 ENCOUNTER — Non-Acute Institutional Stay: Payer: Medicare Other | Admitting: Nurse Practitioner

## 2020-05-05 ENCOUNTER — Encounter: Payer: Self-pay | Admitting: Nurse Practitioner

## 2020-05-05 ENCOUNTER — Other Ambulatory Visit: Payer: Self-pay

## 2020-05-05 DIAGNOSIS — Z515 Encounter for palliative care: Secondary | ICD-10-CM

## 2020-05-05 DIAGNOSIS — I5022 Chronic systolic (congestive) heart failure: Secondary | ICD-10-CM

## 2020-05-05 NOTE — Progress Notes (Signed)
Therapist, nutritional Palliative Care Consult Note Telephone: [phone]  Fax: [phone]  PATIENT NAME: Raymond [name] DOB: 02-Jul-1961 MRN: [id number]  PRIMARY CARE PROVIDER:Dr Kathleen Lime PROVIDER:Dr Hodges/ [facility]  Responsible party: [name] DSS guardian [phone] or [phone]  1.Advance Care Planning; DNR in Vynca  2. Goals of Care: Goals include to maximize quality of life and symptom management. Our advance care planning conversation included a discussion about:   The value and importance of advance care planning  Exploration of personal, cultural or spiritual beliefs that might influence medical decisions  Exploration of goals of care in the event of a sudden injury or illness  Identification and preparation of a healthcare agent  Review and updating or creation of anadvance directive document.  3.Palliative care encounter; Palliative care encounter; Palliative medicine team will continue to support patient, patient's family, and medical team. Visit consisted of counseling and education dealing with the complex and emotionally intense issues of symptom management and palliative care in the setting of serious and potentially life-threatening illness  4. f/u2 weeks or sooner, will monitor progress while at STR, planning, monitoring appetite, weights, goc, revisit code status  I spent  60 minutes providing this consultation, start at 2:45pm. More than 50% of the time in this consultation was spent coordinating communication.   HISTORY OF PRESENT ILLNESS:  Raymond [name] is a 59 y.o. year old male with multiple medical problems including Atrial fibrillation, chronic systolic/ diastolic congestive heart failure, Severe mitral regurgitation, systolic murmur, mental with intellectual disability, epileptic seizures, hyperlipidemia, GERD, severe protein calorie malnutrition, obsessive compulsive  disorder. Hospitalize 8 / 08/2019 to 8 / 12 / 2021 for seizure with work up finding acute on chronic congestive heart failure exacerbation, SNF placement. Echo on 8 / 3 / 2021 with ef 40 to 45% Global hypokinesia, moderate LAE and moderate-to-severe MVR. Normocytic anemia thrombocytopenia stable. Persistent atrial fibrillation discharged on amiodarone, coreg, digoxin. Severe malnutrition with muscle mass and Sub-Q fat loss with 22 pound weight loss in the last year. Hospitalized 04/22/2020 to 05/03/2020 for altered mental status. Workup significant for acute metabolic encephalopathy, hyperammonemia, dysphagia, seizure disorder, hyponatremia, atrial fib, HLD,  Mitral regurgitation Severe on Transthoracic Echocardiogram. Chronic systolic heart failure Stable. Last EF of 40-45%. Weight has been steady while on IV fluids. Losartan held secondary to hypotension. Mr Testa continues to reside at [facility]. Mr Meunier is bed-bound, requires have to turn in position him. Mr Stgermain is total ADL dependent including turning, positioning, mobility, bathing, dressing, toileting, feeding. Mr Dieudonne has require assistance with feeding and appetite continues to be declined per staff. Mr Salinger does mumble answers at times it is very difficult to understand him. Staff endorses no other changes since he is returned back to the facility. DNR in place. At present Mr. Wiederholt is lying in bed, appears pale, thin, debilitated. Mr Cape appears comfortable. Mr Giannotti appears comfortable with no visitors present. I visited and observed Mr. Parkerson. Mr Matsumura did make eye contact with verbal cues and mumbled some answers when asked questions. No meaningful discussion with cognitive impairment was very soft speech. Mr Mannella was cooperative with assessment. Medical goals review. Emotional support provided. Will send notes to [facility] DSS guardian as no new changes recommended at this time. Will  continue to follow a monitor with next visit in one week as he is a high risk for rehospitalization with further discussions of medical goals of  care including most form. I have updated nursing staff no new changes at present time.  Palliative Care was asked to help to continue to address goals of care.   CODE STATUS: DNR  PPS: 30% HOSPICE ELIGIBILITY/DIAGNOSIS: TBD  PAST MEDICAL HISTORY:  Past Medical History:  Diagnosis Date  . Epilepsy (HCC)   . Epileptic seizures (HCC)   . Hyperlipidemia   . Mental retardation   . OCD (obsessive compulsive disorder)    water drinking  . OCD (obsessive compulsive disorder)   . Severe mitral regurgitation    a. 02/2016 Echo: EF 55-60%, no rwma, MVP of posterior leaflet w/ an anteriorly directed MR - severe. Sev dil LA; b. TTE 8/18: EF of 55-60%, normal wall motion, normal LV diastolic function parameters, severe mitral prolapse involving the anterior leaflet and posterior leaflet with severe regurgitation directed anteriorly, severely dilated left atrium, unable to estimate the PASP  . Systolic murmur     SOCIAL HX:  Social History   Tobacco Use  . Smoking status: Never Smoker  . Smokeless tobacco: Never Used  Substance Use Topics  . Alcohol use: No    Alcohol/week: 0.0 standard drinks    ALLERGIES: No Known Allergies   PERTINENT MEDICATIONS:  Outpatient Encounter Medications as of 05/05/2020  Medication Sig  . albuterol (PROVENTIL HFA;VENTOLIN HFA) 108 (90 Base) MCG/ACT inhaler Inhale 2 puffs into the lungs every 6 (six) hours as needed for wheezing or shortness of breath.  Marland Kitchen amiodarone (PACERONE) 200 MG tablet Take 1 tablet (200 mg total) by mouth daily. Start taking this dosage on March 16, 2020  . aspirin EC 81 MG tablet Take 81 mg by mouth daily.  . benztropine (COGENTIN) 0.5 MG tablet Take 0.5 mg by mouth 2 (two) times daily.  . carvedilol (COREG) 3.125 MG tablet Take 3.125 mg by mouth 2 (two) times daily with a meal. (Patient not  taking: Reported on 04/25/2020)  . Cholecalciferol (VITAMIN D) 50 MCG (2000 UT) CAPS Take 2,000 Units by mouth daily.   . Cyanocobalamin (VITAMIN B 12 PO) Take 1,000 mcg by mouth daily.   . digoxin 62.5 MCG TABS Take 0.0625 mg by mouth daily.  . Ergocalciferol (VITAMIN D2 PO) Take 1.25 mg by mouth once a week.  . famotidine (PEPCID) 40 MG tablet Take 40 mg by mouth at bedtime.  . fenofibrate (TRICOR) 145 MG tablet TAKE 1 TABLET BY MOUTH ONCE DAILY FOR LIPIDS. (Patient taking differently: Take 145 mg by mouth daily. )  . fluticasone (FLONASE) 50 MCG/ACT nasal spray Place 2 sprays into both nostrils daily.  Marland Kitchen glycopyrrolate (ROBINUL) 1 MG tablet Take 1 mg by mouth 2 (two) times daily.  Marland Kitchen lacosamide 100 MG TABS Take 1 tablet (100 mg total) by mouth 2 (two) times daily.  Marland Kitchen lactulose (CHRONULAC) 10 GM/15ML solution Take 45 mLs (30 g total) by mouth daily.  Marland Kitchen loratadine (CLARITIN) 10 MG tablet Take 10 mg by mouth daily.  . meclizine (ANTIVERT) 25 MG tablet Take 25 mg by mouth 3 (three) times daily as needed for dizziness.  . midodrine (PROAMATINE) 10 MG tablet Take 10 mg by mouth 3 (three) times daily.  . Multiple Vitamin (MULTIVITAMIN WITH MINERALS) TABS tablet Take 1 tablet by mouth daily.  . Nutritional Supplements (ENSURE PO) Take 1 Can by mouth daily.  Marland Kitchen omeprazole (PRILOSEC) 40 MG capsule Take 40 mg by mouth daily.  . risperiDONE (RISPERDAL) 0.5 MG tablet Take 1 tablet (0.5 mg total) by mouth daily.  . risperiDONE (  RISPERDAL) 1 MG tablet Take 1 tablet (1 mg total) by mouth at bedtime.   No facility-administered encounter medications on file as of 05/05/2020.    PHYSICAL EXAM:   General: NAD, frail appearing, thin, chronically ill, debilitated male Cardiovascular: regular rate and rhythm Pulmonary: clear ant fields Neurological: generalized weakness  Marquel Spoto Prince [place], NP

## 2020-05-18 ENCOUNTER — Other Ambulatory Visit: Payer: Self-pay

## 2020-05-18 ENCOUNTER — Encounter: Payer: Self-pay | Admitting: Nurse Practitioner

## 2020-05-18 ENCOUNTER — Non-Acute Institutional Stay: Payer: Medicare Other | Admitting: Nurse Practitioner

## 2020-05-18 DIAGNOSIS — I5022 Chronic systolic (congestive) heart failure: Secondary | ICD-10-CM

## 2020-05-18 DIAGNOSIS — Z515 Encounter for palliative care: Secondary | ICD-10-CM

## 2020-05-18 NOTE — Progress Notes (Signed)
Therapist, nutritional Palliative Care Consult Note Telephone: [phone]  Fax: [phone]  PATIENT NAME: Raymond [name] DOB: 09-Mar-1961 MRN: [id number]  PRIMARY CARE PROVIDER:Dr Kathleen Lime PROVIDER:Dr Hodges/[facility]  Responsible party: [name] DSS guardian [phone] or [phone]  1.Advance Care Planning; DNR in Vynca  2. Goals of Care: Goals include to maximize quality of life and symptom management. Our advance care planning conversation included a discussion about:   The value and importance of advance care planning  Exploration of personal, cultural or spiritual beliefs that might influence medical decisions  Exploration of goals of care in the event of a sudden injury or illness  Identification and preparation of a healthcare agent  Review and updating or creation of anadvance directive document.  3.Palliative care encounter; Palliative care encounter; Palliative medicine team will continue to support patient, patient's family, and medical team. Visit consisted of counseling and education dealing with the complex and emotionally intense issues of symptom management and palliative care in the setting of serious and potentially life-threatening illness  4. f/u4 weeks or sooner, will monitor progress while at STR, planning, monitoring appetite, weights, goc, revisit MOST form  I spent 55 minutes providing this consultation,  Start at 1:00pm. More than 50% of the time in this consultation was spent coordinating communication.   HISTORY OF PRESENT ILLNESS:  RAYQUAN [name] is a 59 y.o. year old male with multiple medical problems including Atrial fibrillation, chronic systolic/ diastolic congestive heart failure, Severe mitral regurgitation, systolic murmur, mental with intellectual disability, epileptic seizures, hyperlipidemia, GERD, severe protein calorie malnutrition, obsessive compulsive  disorder. Raymond [name] continues to reside at [facility] at [facility]. Raymond [name] is a look to the wheelchair where he is able to sit up. Raymond [name] does continue to require assistance with bathing, dressing, toileting, feeding. BMI 17.4 with current weight 127 and a 2.8lb weight loss over the last four weeks. Raymond [name] is currently on a. Texture, mildly thick inconsistency, heart-healthy diet. Staff endorses no other changes our concerns at present time. At present Raymond [name] sitting in the wheelchair in his room. Raymond [name] appears comfortable. No visitors present. I visited and observed Raymond. [name]. Raymond [name] does make eye contact with verbal cues. It is difficult to understand Raymond [name] words. Asked if Raymond [name] was in pain and he nodded no. Asked Raymond [name] was hungry and he nodded no. Raymond [name] was cooperative with assessment. Medical goals reviewed. Limited interaction with cognitive impairment. Emotional support provided. I have attempted to contact DSS Guardian from Palliative care visit. Will continue to monitor followed closely at Raymond [name] is a high risk for rehospitalization. Continue discussions of MOST for main goals of care.  Palliative Care was asked to help to continue to address goals of care.   CODE STATUS: DNR  PPS: 40% HOSPICE ELIGIBILITY/DIAGNOSIS: TBD  PAST MEDICAL HISTORY:  Past Medical History:  Diagnosis Date  . Epilepsy (HCC)   . Epileptic seizures (HCC)   . Hyperlipidemia   . Mental retardation   . OCD (obsessive compulsive disorder)    water drinking  . OCD (obsessive compulsive disorder)   . Severe mitral regurgitation    a. 02/2016 Echo: EF 55-60%, no rwma, MVP of posterior leaflet w/ an anteriorly directed Raymond - severe. Sev dil LA; b. TTE 8/18: EF of 55-60%, normal wall motion, normal LV diastolic function parameters, severe mitral prolapse involving the anterior leaflet and posterior leaflet with severe  regurgitation directed anteriorly,  severely dilated left atrium, unable to estimate the PASP  . Systolic murmur     SOCIAL HX:  Social History   Tobacco Use  . Smoking status: Never Smoker  . Smokeless tobacco: Never Used  Substance Use Topics  . Alcohol use: No    Alcohol/week: 0.0 standard drinks    ALLERGIES: No Known Allergies   PHYSICAL EXAM:   General: debilitated,  frail appearing, thin, cognitively impaired male Cardiovascular: regular rate and rhythm Pulmonary: clear ant fields Neurological: functionally quadriplegic  Vernella Niznik Prince [place], NP

## 2020-06-15 ENCOUNTER — Encounter: Payer: Self-pay | Admitting: Nurse Practitioner

## 2020-06-15 ENCOUNTER — Other Ambulatory Visit: Payer: Self-pay

## 2020-06-15 ENCOUNTER — Non-Acute Institutional Stay: Payer: Medicare Other | Admitting: Nurse Practitioner

## 2020-06-15 VITALS — Wt 129.0 lb

## 2020-06-15 DIAGNOSIS — I5022 Chronic systolic (congestive) heart failure: Secondary | ICD-10-CM

## 2020-06-15 DIAGNOSIS — Z515 Encounter for palliative care: Secondary | ICD-10-CM

## 2020-06-15 NOTE — Progress Notes (Signed)
Therapist, nutritional Palliative Care Consult Note Telephone: [phone]  Fax: [phone]  PATIENT NAME: Raymond [name] DOB: [birth year] MRN: [id number]  PRIMARY CARE PROVIDER:Dr Kathleen Lime PROVIDER:Dr Hodges/[facility]  Responsible party: [name] DSS guardian [phone] or [phone]  1.Advance Care Planning; DNR in Vynca  2. Goals of Care: Goals include to maximize quality of life and symptom management. Our advance care planning conversation included a discussion about:   The value and importance of advance care planning  Exploration of personal, cultural or spiritual beliefs that might influence medical decisions  Exploration of goals of care in the event of a sudden injury or illness  Identification and preparation of a healthcare agent  Review and updating or creation of anadvance directive document.  3.Palliative care encounter; Palliative care encounter; Palliative medicine team will continue to support patient, patient's family, and medical team. Visit consisted of counseling and education dealing with the complex and emotionally intense issues of symptom management and palliative care in the setting of serious and potentially life-threatening illness  4. f/u4 weeks or sooner, will monitor progress while at STR, planning, monitoring appetite, weights, goc, revisit MOST form  I spent 50 minutes providing this consultation, starting at 10:45am. More than 50% of the time in this consultation was spent coordinating communication.   HISTORY OF PRESENT ILLNESS:  Raymond [name] is a 59 y.o. year old male with multiple medical problems including Atrial fibrillation, chronic systolic/ diastolic congestive heart failure, Severe mitral regurgitation, systolic murmur, mental with intellectual disability, epileptic seizures, hyperlipidemia, GERD, severe protein calorie malnutrition, obsessive compulsive  disorder. Raymond [name] continues to reside at [facility]. Raymond [name] does transfer to the wheelchair and his mobile propelling the wheelchair with his feet when he wants to. Raymond [name] requires assistance for transfers, bathing, dressing. Raymond [name] requires assistance with feeding with current weight 129.0 lb + BMI 17.5. Raymond [name] is on a Heart healthy pureed texture mildly thick consistency. Staff endorses the new changes their concern. No recent wounds, falls, hospitalizations. Medical goals reviewed. I visited and observed Raymond [name]. At present Raymond [name] is lying in bed with the sheet over his head. Raymond [name] pulled the sheet down when spoken to you, made eye contact and answered simple questions limited with cognitive impairment. Raymond [name] denied symptoms of pain. Raymond [name] was cooperative with assessment those shared that he was tired and ready to go back to sleep. Support provided. Will contact guardian for an update on Palliative care do current goals or plan of care. I updated nursing staff. Will continue to monitor and follow with next visit in 4 weeks to continue to monitor weight, appetite, mobility, chronic disease progression.  Palliative Care was asked to help address goals of care.   CODE STATUS: DNR  PPS: 40% HOSPICE ELIGIBILITY/DIAGNOSIS: TBD  PAST MEDICAL HISTORY:  Past Medical History:  Diagnosis Date  . Epilepsy (HCC)   . Epileptic seizures (HCC)   . Hyperlipidemia   . Mental retardation   . OCD (obsessive compulsive disorder)    water drinking  . OCD (obsessive compulsive disorder)   . Severe mitral regurgitation    a. 02/2016 Echo: EF 55-60%, no rwma, MVP of posterior leaflet w/ an anteriorly directed Raymond - severe. Sev dil LA; b. TTE 8/18: EF of 55-60%, normal wall motion, normal LV diastolic function parameters, severe mitral prolapse involving the anterior leaflet and posterior leaflet with severe regurgitation directed  anteriorly, severely dilated left atrium, unable to estimate the PASP  . Systolic murmur     SOCIAL HX:  Social History   Tobacco Use  . Smoking status: Never Smoker  . Smokeless tobacco: Never Used  Substance Use Topics  . Alcohol use: No    Alcohol/week: 0.0 standard drinks    ALLERGIES: No Known Allergies     PHYSICAL EXAM:   General: NAD, frail appearing, thin, debilitated, male Cardiovascular: regular rate and rhythm Pulmonary: clear ant fields Extremities: no edema, no joint deformities; muscle wasting; Neurological: w/c dependent  Nicky Kras Prince [place], NP

## 2020-06-21 ENCOUNTER — Inpatient Hospital Stay
Admission: EM | Admit: 2020-06-21 | Discharge: 2020-06-29 | DRG: 871 | Disposition: E | Payer: Medicare Other | Attending: Pulmonary Disease | Admitting: Pulmonary Disease

## 2020-06-21 ENCOUNTER — Emergency Department: Payer: Medicare Other

## 2020-06-21 DIAGNOSIS — F429 Obsessive-compulsive disorder, unspecified: Secondary | ICD-10-CM | POA: Diagnosis present

## 2020-06-21 DIAGNOSIS — E872 Acidosis: Secondary | ICD-10-CM | POA: Diagnosis present

## 2020-06-21 DIAGNOSIS — E875 Hyperkalemia: Secondary | ICD-10-CM | POA: Diagnosis present

## 2020-06-21 DIAGNOSIS — I248 Other forms of acute ischemic heart disease: Secondary | ICD-10-CM | POA: Diagnosis present

## 2020-06-21 DIAGNOSIS — Z7189 Other specified counseling: Secondary | ICD-10-CM | POA: Diagnosis not present

## 2020-06-21 DIAGNOSIS — Z7982 Long term (current) use of aspirin: Secondary | ICD-10-CM | POA: Diagnosis not present

## 2020-06-21 DIAGNOSIS — G40909 Epilepsy, unspecified, not intractable, without status epilepticus: Secondary | ICD-10-CM | POA: Diagnosis present

## 2020-06-21 DIAGNOSIS — Z79899 Other long term (current) drug therapy: Secondary | ICD-10-CM

## 2020-06-21 DIAGNOSIS — T460X5A Adverse effect of cardiac-stimulant glycosides and drugs of similar action, initial encounter: Secondary | ICD-10-CM | POA: Diagnosis present

## 2020-06-21 DIAGNOSIS — Z681 Body mass index (BMI) 19 or less, adult: Secondary | ICD-10-CM | POA: Diagnosis not present

## 2020-06-21 DIAGNOSIS — R64 Cachexia: Secondary | ICD-10-CM | POA: Diagnosis present

## 2020-06-21 DIAGNOSIS — L89159 Pressure ulcer of sacral region, unspecified stage: Secondary | ICD-10-CM | POA: Diagnosis present

## 2020-06-21 DIAGNOSIS — R6520 Severe sepsis without septic shock: Secondary | ICD-10-CM | POA: Diagnosis not present

## 2020-06-21 DIAGNOSIS — Z993 Dependence on wheelchair: Secondary | ICD-10-CM | POA: Diagnosis not present

## 2020-06-21 DIAGNOSIS — R4182 Altered mental status, unspecified: Secondary | ICD-10-CM | POA: Diagnosis not present

## 2020-06-21 DIAGNOSIS — I4892 Unspecified atrial flutter: Secondary | ICD-10-CM | POA: Diagnosis present

## 2020-06-21 DIAGNOSIS — E43 Unspecified severe protein-calorie malnutrition: Secondary | ICD-10-CM | POA: Diagnosis present

## 2020-06-21 DIAGNOSIS — Z66 Do not resuscitate: Secondary | ICD-10-CM | POA: Diagnosis present

## 2020-06-21 DIAGNOSIS — J9601 Acute respiratory failure with hypoxia: Secondary | ICD-10-CM | POA: Diagnosis present

## 2020-06-21 DIAGNOSIS — R778 Other specified abnormalities of plasma proteins: Secondary | ICD-10-CM

## 2020-06-21 DIAGNOSIS — N390 Urinary tract infection, site not specified: Secondary | ICD-10-CM | POA: Diagnosis present

## 2020-06-21 DIAGNOSIS — R6521 Severe sepsis with septic shock: Secondary | ICD-10-CM | POA: Diagnosis present

## 2020-06-21 DIAGNOSIS — N170 Acute kidney failure with tubular necrosis: Secondary | ICD-10-CM | POA: Diagnosis not present

## 2020-06-21 DIAGNOSIS — N179 Acute kidney failure, unspecified: Secondary | ICD-10-CM | POA: Diagnosis present

## 2020-06-21 DIAGNOSIS — F79 Unspecified intellectual disabilities: Secondary | ICD-10-CM | POA: Diagnosis present

## 2020-06-21 DIAGNOSIS — Z515 Encounter for palliative care: Secondary | ICD-10-CM | POA: Diagnosis not present

## 2020-06-21 DIAGNOSIS — T460X1A Poisoning by cardiac-stimulant glycosides and drugs of similar action, accidental (unintentional), initial encounter: Secondary | ICD-10-CM

## 2020-06-21 DIAGNOSIS — Z7401 Bed confinement status: Secondary | ICD-10-CM

## 2020-06-21 DIAGNOSIS — G928 Other toxic encephalopathy: Secondary | ICD-10-CM | POA: Diagnosis present

## 2020-06-21 DIAGNOSIS — A419 Sepsis, unspecified organism: Secondary | ICD-10-CM | POA: Diagnosis present

## 2020-06-21 DIAGNOSIS — E785 Hyperlipidemia, unspecified: Secondary | ICD-10-CM | POA: Diagnosis present

## 2020-06-21 DIAGNOSIS — Z20822 Contact with and (suspected) exposure to covid-19: Secondary | ICD-10-CM | POA: Diagnosis present

## 2020-06-21 LAB — URINALYSIS, COMPLETE (UACMP) WITH MICROSCOPIC
Bacteria, UA: NONE SEEN
Bilirubin Urine: NEGATIVE
Glucose, UA: NEGATIVE mg/dL
Ketones, ur: NEGATIVE mg/dL
Nitrite: NEGATIVE
Protein, ur: 30 mg/dL — AB
RBC / HPF: >50 RBC/hpf — ABNORMAL HIGH (ref 0–5)
Specific Gravity, Urine: 1.006 (ref 1.005–1.030)
Squamous Epithelial / HPF: NONE SEEN (ref 0–5)
WBC, UA: >50 WBC/hpf — ABNORMAL HIGH (ref 0–5)
pH: 5 (ref 5.0–8.0)

## 2020-06-21 LAB — BASIC METABOLIC PANEL
Anion gap: 15 (ref 5–15)
BUN: 87 mg/dL — ABNORMAL HIGH (ref 6–20)
CO2: 20 mmol/L — ABNORMAL LOW (ref 22–32)
Calcium: 8.1 mg/dL — ABNORMAL LOW (ref 8.9–10.3)
Chloride: 103 mmol/L (ref 98–111)
Creatinine, Ser: 3.46 mg/dL — ABNORMAL HIGH (ref 0.61–1.24)
GFR, Estimated: 20 mL/min — ABNORMAL LOW (ref >60)
Glucose, Bld: 239 mg/dL — ABNORMAL HIGH (ref 70–99)
Potassium: 4.3 mmol/L (ref 3.5–5.1)
Sodium: 138 mmol/L (ref 135–145)

## 2020-06-21 LAB — CBC WITH DIFFERENTIAL/PLATELET
Abs Immature Granulocytes: 0.21 10*3/uL — ABNORMAL HIGH (ref 0.00–0.07)
Basophils Absolute: 0 10*3/uL (ref 0.0–0.1)
Basophils Relative: 0 %
Eosinophils Absolute: 0.1 10*3/uL (ref 0.0–0.5)
Eosinophils Relative: 1 %
HCT: 34.2 % — ABNORMAL LOW (ref 39.0–52.0)
Hemoglobin: 11.2 g/dL — ABNORMAL LOW (ref 13.0–17.0)
Immature Granulocytes: 1 %
Lymphocytes Relative: 11 %
Lymphs Abs: 1.6 10*3/uL (ref 0.7–4.0)
MCH: 29.6 pg (ref 26.0–34.0)
MCHC: 32.7 g/dL (ref 30.0–36.0)
MCV: 90.2 fL (ref 80.0–100.0)
Monocytes Absolute: 0.7 10*3/uL (ref 0.1–1.0)
Monocytes Relative: 5 %
Neutro Abs: 12.4 10*3/uL — ABNORMAL HIGH (ref 1.7–7.7)
Neutrophils Relative %: 82 %
Platelets: 285 10*3/uL (ref 150–400)
RBC: 3.79 MIL/uL — ABNORMAL LOW (ref 4.22–5.81)
RDW: 15.6 % — ABNORMAL HIGH (ref 11.5–15.5)
Smear Review: NORMAL
WBC: 15.1 10*3/uL — ABNORMAL HIGH (ref 4.0–10.5)
nRBC: 0.1 % (ref 0.0–0.2)

## 2020-06-21 LAB — APTT: aPTT: 51 seconds — ABNORMAL HIGH (ref 24–36)

## 2020-06-21 LAB — LACTIC ACID, PLASMA
Lactic Acid, Venous: 2.4 mmol/L (ref 0.5–1.9)
Lactic Acid, Venous: 3.6 mmol/L (ref 0.5–1.9)

## 2020-06-21 LAB — COMPREHENSIVE METABOLIC PANEL
ALT: 59 U/L — ABNORMAL HIGH (ref 0–44)
AST: 159 U/L — ABNORMAL HIGH (ref 15–41)
Albumin: 2.5 g/dL — ABNORMAL LOW (ref 3.5–5.0)
Alkaline Phosphatase: 49 U/L (ref 38–126)
Anion gap: 17 — ABNORMAL HIGH (ref 5–15)
BUN: 92 mg/dL — ABNORMAL HIGH (ref 6–20)
CO2: 20 mmol/L — ABNORMAL LOW (ref 22–32)
Calcium: 9.1 mg/dL (ref 8.9–10.3)
Chloride: 101 mmol/L (ref 98–111)
Creatinine, Ser: 3.8 mg/dL — ABNORMAL HIGH (ref 0.61–1.24)
GFR, Estimated: 18 mL/min — ABNORMAL LOW (ref >60)
Glucose, Bld: 137 mg/dL — ABNORMAL HIGH (ref 70–99)
Potassium: 5.9 mmol/L — ABNORMAL HIGH (ref 3.5–5.1)
Sodium: 138 mmol/L (ref 135–145)
Total Bilirubin: 1.8 mg/dL — ABNORMAL HIGH (ref 0.3–1.2)
Total Protein: 6.9 g/dL (ref 6.5–8.1)

## 2020-06-21 LAB — PROTIME-INR
INR: 3.5 — ABNORMAL HIGH (ref 0.8–1.2)
Prothrombin Time: 33.9 seconds — ABNORMAL HIGH (ref 11.4–15.2)

## 2020-06-21 LAB — RESP PANEL BY RT-PCR (FLU A&B, COVID) ARPGX2
Influenza A by PCR: NEGATIVE
Influenza B by PCR: NEGATIVE
SARS Coronavirus 2 by RT PCR: NEGATIVE

## 2020-06-21 LAB — BLOOD GAS, VENOUS
Acid-base deficit: 5.5 mmol/L — ABNORMAL HIGH (ref 0.0–2.0)
Bicarbonate: 19.2 mmol/L — ABNORMAL LOW (ref 20.0–28.0)
O2 Saturation: 64.4 %
Patient temperature: 37
pCO2, Ven: 34 mmHg — ABNORMAL LOW (ref 44.0–60.0)
pH, Ven: 7.36 (ref 7.250–7.430)
pO2, Ven: 35 mmHg (ref 32.0–45.0)

## 2020-06-21 LAB — DIGOXIN LEVEL: Digoxin Level: 2.4 ng/mL (ref 1.0–2.0)

## 2020-06-21 LAB — AMMONIA: Ammonia: 13 umol/L (ref 9–35)

## 2020-06-21 LAB — MAGNESIUM: Magnesium: 2.6 mg/dL — ABNORMAL HIGH (ref 1.7–2.4)

## 2020-06-21 LAB — BRAIN NATRIURETIC PEPTIDE: B Natriuretic Peptide: 488.8 pg/mL — ABNORMAL HIGH (ref 0.0–100.0)

## 2020-06-21 LAB — MRSA PCR SCREENING: MRSA by PCR: NEGATIVE

## 2020-06-21 LAB — CBG MONITORING, ED: Glucose-Capillary: 286 mg/dL — ABNORMAL HIGH (ref 70–99)

## 2020-06-21 LAB — TROPONIN I (HIGH SENSITIVITY)
Troponin I (High Sensitivity): 85 ng/L — ABNORMAL HIGH (ref <18)
Troponin I (High Sensitivity): 72 ng/L — ABNORMAL HIGH (ref <18)

## 2020-06-21 LAB — GLUCOSE, CAPILLARY
Glucose-Capillary: 260 mg/dL — ABNORMAL HIGH (ref 70–99)
Glucose-Capillary: 144 mg/dL — ABNORMAL HIGH (ref 70–99)

## 2020-06-21 LAB — PROCALCITONIN: Procalcitonin: 2.11 ng/mL

## 2020-06-21 MED ORDER — GLYCOPYRROLATE 0.2 MG/ML IJ SOLN: 0.2000 mg | INTRAMUSCULAR | Status: DC | PRN

## 2020-06-21 MED ORDER — SODIUM CHLORIDE 0.9 % IV SOLN
8.0000 | Freq: Once | INTRAVENOUS | Status: AC
  Administered 2020-06-21: 8 via INTRAVENOUS
  Filled 2020-06-21: qty 320

## 2020-06-21 MED ORDER — VANCOMYCIN HCL IN DEXTROSE 1-5 GM/200ML-% IV SOLN
1000.0000 mg | Freq: Once | INTRAVENOUS | Status: AC
  Administered 2020-06-21: 1000 mg via INTRAVENOUS
  Filled 2020-06-21: qty 200

## 2020-06-21 MED ORDER — ORAL CARE MOUTH RINSE: 15.0000 mL | Freq: Two times a day (BID) | OROMUCOSAL | Status: DC

## 2020-06-21 MED ORDER — PIPERACILLIN-TAZOBACTAM IN DEX 2-0.25 GM/50ML IV SOLN
2.2500 g | Freq: Four times a day (QID) | INTRAVENOUS | Status: DC
  Administered 2020-06-21: 2.25 g via INTRAVENOUS
  Filled 2020-06-21 (×4): qty 50

## 2020-06-21 MED ORDER — METRONIDAZOLE IN NACL 5-0.79 MG/ML-% IV SOLN
500.0000 mg | Freq: Once | INTRAVENOUS | Status: AC
  Administered 2020-06-21: 500 mg via INTRAVENOUS
  Filled 2020-06-21: qty 100

## 2020-06-21 MED ORDER — FAMOTIDINE IN NACL 20-0.9 MG/50ML-% IV SOLN: 20.0000 mg | Freq: Every day | INTRAVENOUS | Status: DC

## 2020-06-21 MED ORDER — DOCUSATE SODIUM 100 MG PO CAPS: 100.0000 mg | ORAL_CAPSULE | Freq: Two times a day (BID) | ORAL | Status: DC | PRN

## 2020-06-21 MED ORDER — ALBUTEROL SULFATE (2.5 MG/3ML) 0.083% IN NEBU
10.0000 mg | INHALATION_SOLUTION | Freq: Once | RESPIRATORY_TRACT | Status: AC
  Administered 2020-06-21: 10 mg via RESPIRATORY_TRACT
  Filled 2020-06-21: qty 12

## 2020-06-21 MED ORDER — ONDANSETRON HCL 4 MG/2ML IJ SOLN: 4.0000 mg | Freq: Four times a day (QID) | INTRAMUSCULAR | Status: DC | PRN

## 2020-06-21 MED ORDER — INSULIN ASPART 100 UNIT/ML IV SOLN
5.0000 [IU] | Freq: Once | INTRAVENOUS | Status: AC
  Administered 2020-06-21: 5 [IU] via INTRAVENOUS
  Filled 2020-06-21: qty 0.05

## 2020-06-21 MED ORDER — POLYETHYLENE GLYCOL 3350 17 G PO PACK: 17.0000 g | PACK | Freq: Every day | ORAL | Status: DC | PRN

## 2020-06-21 MED ORDER — IPRATROPIUM-ALBUTEROL 0.5-2.5 (3) MG/3ML IN SOLN: 3.0000 mL | RESPIRATORY_TRACT | Status: DC | PRN

## 2020-06-21 MED ORDER — SODIUM CHLORIDE 0.9 % IV SOLN
10.0000 | Freq: Once | INTRAVENOUS | Status: DC
  Filled 2020-06-21: qty 400

## 2020-06-21 MED ORDER — LACTATED RINGERS IV BOLUS
1000.0000 mL | Freq: Once | INTRAVENOUS | Status: AC
  Administered 2020-06-21: 1000 mL via INTRAVENOUS

## 2020-06-21 MED ORDER — SODIUM CHLORIDE 0.9 % IV SOLN
2.0000 g | Freq: Once | INTRAVENOUS | Status: AC
  Administered 2020-06-21: 2 g via INTRAVENOUS
  Filled 2020-06-21: qty 2

## 2020-06-21 MED ORDER — INSULIN ASPART 100 UNIT/ML ~~LOC~~ SOLN
SUBCUTANEOUS | Status: AC
  Filled 2020-06-21: qty 1

## 2020-06-21 MED ORDER — SODIUM CHLORIDE 0.9 % IV SOLN
0.5000 mg/h | INTRAVENOUS | Status: DC
  Administered 2020-06-21: 0.5 mg/h via INTRAVENOUS
  Filled 2020-06-21: qty 5

## 2020-06-21 MED ORDER — SODIUM BICARBONATE 8.4 % IV SOLN
50.0000 meq | Freq: Once | INTRAVENOUS | Status: AC
  Administered 2020-06-21: 50 meq via INTRAVENOUS
  Filled 2020-06-21: qty 50

## 2020-06-21 MED ORDER — BISACODYL 10 MG RE SUPP
10.0000 mg | Freq: Every day | RECTAL | Status: DC | PRN
  Filled 2020-06-21: qty 1

## 2020-06-21 MED ORDER — DEXTROSE 50 % IV SOLN
1.0000 | Freq: Once | INTRAVENOUS | Status: AC
  Administered 2020-06-21: 50 mL via INTRAVENOUS
  Filled 2020-06-21: qty 50

## 2020-06-21 MED ORDER — LORAZEPAM 2 MG/ML IJ SOLN
1.0000 mg | INTRAMUSCULAR | Status: DC | PRN
  Administered 2020-06-21 (×2): 1 mg via INTRAVENOUS
  Filled 2020-06-21 (×2): qty 1

## 2020-06-21 MED ORDER — LACTATED RINGERS IV SOLN: INTRAVENOUS | Status: DC

## 2020-06-21 MED ORDER — HYDROMORPHONE BOLUS VIA INFUSION
0.5000 mg | INTRAVENOUS | Status: DC | PRN
  Administered 2020-06-21 (×3): 0.5 mg via INTRAVENOUS
  Filled 2020-06-21: qty 1

## 2020-06-21 MED ORDER — CHLORHEXIDINE GLUCONATE 0.12 % MT SOLN: 15.0000 mL | Freq: Two times a day (BID) | OROMUCOSAL | Status: DC

## 2020-06-22 LAB — BLOOD CULTURE ID PANEL (REFLEXED) - BCID2

## 2020-06-23 LAB — URINE CULTURE: Culture: >=100000 — AB

## 2020-06-24 LAB — CULTURE, BLOOD (ROUTINE X 2): Special Requests: ADEQUATE

## 2020-06-29 NOTE — Progress Notes (Signed)
CODE SEPSIS - PHARMACY COMMUNICATION  **Broad Spectrum Antibiotics should be administered within 1 hour of Sepsis diagnosis**  Time Code Sepsis Called/Page Received: 0506  Antibiotics Ordered: Vancomycin, Flagyl and Cefepime  Time of 1st antibiotic administration: 0518  Additional action taken by pharmacy:   If necessary, Name of Provider/Nurse Contacted:    Wayland Denis ,PharmD Clinical Pharmacist  June 22, 2020  5:06 AM

## 2020-06-29 NOTE — ED Notes (Signed)
Taken to ICU by this RN and ED tech. Pt acute changes en route. Irving Burton, RN at bedside. Bag of belongings left in ICU room with patient.

## 2020-06-29 NOTE — ED Notes (Signed)
Report to Emily, RN

## 2020-06-29 NOTE — Consult Note (Signed)
Consultation Note Date: July 15, 2020   Patient Name: Raymond [name]  DOB: 1961-01-30  MRN: [id number]  Age / Sex: 59 y.o., male  PCP: [name] Referring Physician: Salena Saner, MD  Reason for Consultation: Terminal Care  HPI/Patient Profile: 59 y.o. male  with past medical history of cognitive disability, seizure disorder, severe mitral regurgitation with CHF, ward of the state under guardianship, admitted on 07-15-20 with mental status changes.  He was found to be in multiorgan failure with acute kidney injury, toxic digoxin level, septic shock likely related to UTI, extensive sacral decubitus.  DNR on admission.  Currently on high flow oxygen.  Nonresponsive.  Palliative medicine consulted for goals of care, critical care doctor recommending transition to comfort measures and hospice.  Clinical Assessment and Goals of Care: Chart was reviewed and patient evaluated. He is very cachectic, he does not respond to any of my verbal questions or follow commands. He appears to be at end-of-life.  His chart was reviewed and noted he was seen by palliative medicine on November 17-at which time he answered questions minimally, was tired and pulled the sheet over his head avoiding discussion. I was able to reach Burney Gauze, Interior and spatial designer for [employer].  She is currently legal guardian for patient. I discussed patient's current medical issues and possible trajectories, along with the critical care doctors recommendation for transition to full comfort measures. Haywood Lasso gave consent to transition patient to full comfort measures only, to alleviate suffering, and to allow for natural death.   Primary Decision Maker LEGAL GUARDIAN      SUMMARY OF RECOMMENDATIONS -Transition to full comfort measures -DC all labs and/or life prolonging medications and interventions that are  not contributing to patient's comfort -Given that patient is on high flow oxygen and has acute kidney injury will start Dilaudid infusion to avoid air hunger -Other comfort interventions as ordered -Shon Baton request to be notified when patient dies, her phone number is [phone]  Code Status/Advance Care Planning:  DNR  Additional Recommendations (Limitations, Scope, Preferences):  Full Comfort Care  Prognosis:    Hours - Days  Discharge Planning: Anticipated Hospital Death  Primary Diagnoses: Present on Admission: . Sepsis (HCC)   I have reviewed the medical record, interviewed the patient and family, and examined the patient. The following aspects are pertinent.  Past Medical History:  Diagnosis Date  . Epilepsy (HCC)   . Epileptic seizures (HCC)   . Hyperlipidemia   . Mental retardation   . OCD (obsessive compulsive disorder)    water drinking  . OCD (obsessive compulsive disorder)   . Severe mitral regurgitation    a. 02/2016 Echo: EF 55-60%, no rwma, MVP of posterior leaflet w/ an anteriorly directed MR - severe. Sev dil LA; b. TTE 8/18: EF of 55-60%, normal wall motion, normal LV diastolic function parameters, severe mitral prolapse involving the anterior leaflet and posterior leaflet with severe regurgitation directed anteriorly, severely dilated left atrium, unable to estimate the PASP  . Systolic murmur  Social History   Socioeconomic History  . Marital status: Single    Spouse name: Not on file  . Number of children: Not on file  . Years of education: Not on file  . Highest education level: Not on file  Occupational History  . Not on file  Tobacco Use  . Smoking status: Never Smoker  . Smokeless tobacco: Never Used  Vaping Use  . Vaping Use: Never used  Substance and Sexual Activity  . Alcohol use: No    Alcohol/week: 0.0 standard drinks  . Drug use: No  . Sexual activity: Never  Other Topics Concern  . Not on file  Social History  Narrative   ** Merged History Encounter **       Social Determinants of Health   Financial Resource Strain:   . Difficulty of Paying Living Expenses: Not on file  Food Insecurity:   . Worried About Programme researcher, broadcasting/film/video in the Last Year: Not on file  . Ran Out of Food in the Last Year: Not on file  Transportation Needs:   . Lack of Transportation (Medical): Not on file  . Lack of Transportation (Non-Medical): Not on file  Physical Activity:   . Days of Exercise per Week: Not on file  . Minutes of Exercise per Session: Not on file  Stress:   . Feeling of Stress : Not on file  Social Connections:   . Frequency of Communication with Friends and Family: Not on file  . Frequency of Social Gatherings with Friends and Family: Not on file  . Attends Religious Services: Not on file  . Active Member of Clubs or Organizations: Not on file  . Attends Banker Meetings: Not on file  . Marital Status: Not on file   Scheduled Meds: Continuous Infusions: . famotidine (PEPCID) IV    . lactated ringers 150 mL/hr at 2020-07-06 0827  . piperacillin-tazobactam (ZOSYN)  IV 2.25 g (July 06, 2020 0954)   PRN Meds:.bisacodyl, ipratropium-albuterol, ondansetron (ZOFRAN) IV Medications Prior to Admission:  Prior to Admission medications   Medication Sig Start Date End Date Taking? Authorizing Provider  albuterol (PROVENTIL HFA;VENTOLIN HFA) 108 (90 Base) MCG/ACT inhaler Inhale 2 puffs into the lungs every 6 (six) hours as needed for wheezing or shortness of breath. 07/26/16  Yes Particia Nearing, PA-C  amiodarone (PACERONE) 200 MG tablet Take 1 tablet (200 mg total) by mouth daily. Start taking this dosage on March 16, 2020 03/15/20 07/06/20 Yes Charise Killian, MD  aspirin EC 81 MG tablet Take 81 mg by mouth daily.   Yes [provider]  benztropine (COGENTIN) 0.5 MG tablet Take 0.5 mg by mouth 2 (two) times daily. 03/14/20  Yes [provider]  carvedilol (COREG) 3.125  MG tablet Take 3.125 mg by mouth 2 (two) times daily with a meal.    Yes [provider]  Cholecalciferol (VITAMIN D) 50 MCG (2000 UT) CAPS Take 2,000 Units by mouth daily.    Yes [provider]  Cyanocobalamin (VITAMIN B 12 PO) Take 1,000 mcg by mouth daily.    Yes [provider]  digoxin 62.5 MCG TABS Take 0.0625 mg by mouth daily. 03/11/20 Jul 06, 2020 Yes Charise Killian, MD  Ergocalciferol (VITAMIN D2 PO) Take 1.25 mg by mouth once a week.   Yes [provider]  famotidine (PEPCID) 40 MG tablet Take 40 mg by mouth at bedtime.   Yes [provider]  fenofibrate (TRICOR) 145 MG tablet TAKE 1 TABLET BY  MOUTH ONCE DAILY FOR LIPIDS. Patient taking differently: Take 145 mg by mouth daily.  01/02/17  Yes Crissman, Redge Gainer, MD  fluticasone (FLONASE) 50 MCG/ACT nasal spray Place 2 sprays into both nostrils daily.   Yes [provider]  glycopyrrolate (ROBINUL) 1 MG tablet Take 1 mg by mouth 2 (two) times daily.   Yes [provider]  lacosamide 100 MG TABS Take 1 tablet (100 mg total) by mouth 2 (two) times daily. 05/03/20  Yes Narda Bonds, MD  lactulose (CHRONULAC) 10 GM/15ML solution Take 45 mLs (30 g total) by mouth daily. 05/04/20  Yes Narda Bonds, MD  loratadine (CLARITIN) 10 MG tablet Take 10 mg by mouth daily.   Yes [provider]  midodrine (PROAMATINE) 10 MG tablet Take 10 mg by mouth 3 (three) times daily.   Yes [provider]  Multiple Vitamin (MULTIVITAMIN WITH MINERALS) TABS tablet Take 1 tablet by mouth daily.   Yes [provider]  Nutritional Supplements (ENSURE PO) Take 1 Can by mouth daily.   Yes [provider]  omeprazole (PRILOSEC) 40 MG capsule Take 40 mg by mouth daily.   Yes [provider]  risperiDONE (RISPERDAL) 1 MG tablet Take 1 tablet (1 mg total) by mouth at bedtime. 03/29/20  Yes Amin, Ankit Chirag, MD  zinc oxide (BALMEX) 11.3 % CREA cream Apply 1 application  topically 2 (two) times daily.   Yes [provider]  meclizine (ANTIVERT) 25 MG tablet Take 25 mg by mouth 3 (three) times daily as needed for dizziness. Patient not taking: Reported on 2020-07-09    [provider]   No Known Allergies Review of Systems  Unable to perform ROS   Physical Exam Vitals and nursing note reviewed.  Constitutional:      Appearance: He is ill-appearing and toxic-appearing.     Comments: Cachectic  Cardiovascular:     Comments: Hypotensive, weak peripheral pulses Neurological:     Comments: Unresponsive     Vital Signs: BP (!) 78/60   Pulse 91   Temp 97.7 F (36.5 C) (Axillary)   Resp 16   Ht 5\' 10"  (1.778 m) Comment: estimated  Wt 45 kg   SpO2 99%   BMI 14.23 kg/m  Pain Scale: CPOT       SpO2: SpO2: 99 % O2 Device:SpO2: 99 % O2 Flow Rate: .O2 Flow Rate (L/min): 50 L/min  IO: Intake/output summary:   Intake/Output Summary (Last 24 hours) at 07/09/20 1020 Last data filed at Jul 09, 2020 0827 Gross per 24 hour  Intake 1578.73 ml  Output 810 ml  Net 768.73 ml    LBM: Last BM Date:  (PTA) Baseline Weight: Weight: 45 kg Most recent weight: Weight: 45 kg     Palliative Assessment/Data: PPS: 10%     Thank you for this consult. Palliative medicine will continue to follow and assist as needed.   Time In: 1007 Time Out: 1121 Time Total: 74 mins Greater than 50%  of this time was spent counseling and coordinating care related to the above assessment and plan.  Signed by: 1122, AGNP-C Palliative Medicine    Please contact Palliative Medicine Team phone at [phone] for questions and concerns.  For individual provider: See [phone]

## 2020-06-29 NOTE — ED Notes (Signed)
Attempt to call report X 1, unsuccessful.  

## 2020-06-29 NOTE — H&P (Addendum)
NAME:  Raymond [name], MRN:  [id number], DOB:  31-Oct-1960, LOS: 0 ADMISSION DATE:  July 06, 2020, CONSULTATION DATE: Jul 06, 2020 REFERRING MD: Dr. Tamala Julian, CHIEF COMPLAINT: Abnormal Labs   Brief History   59 yo male with a PMH of intellectual disability and wheelchair bound admitted with  multiorgan failure and hyperkalemia secondary to urosepsis   History of present illness   This is a 60 yo male who presented to [facility] ER on 11/23 from [facility] via EMS with altered mental status; swelling in the right arm concerning for possible iv infiltration, and hyperkalemia.  Per ER notes EMS reported upon their arrival at facility pt tachycardic, hypotensive, and hypoxic requiring 4L O2 via nasal canula.  EMS also reported the pt is nonverbal although during hospitalization 1 month ago pt able to answer yes/no questions only.  Upon arrival to the ER pt transitioned to NRB.  ER lab results revealed K+ 5.9, CO2 20, glucose 137, BUN 92, creatinine 3.80, anion gap 17, magnesium 2.6, albumin 2.5, AST 159, ALT 59, BNP 488.8, troponin 85, lactic acid 2.4, wbc 15.1, hgb 11.2, PT 33.9, INR 3.5, serum digoxin 2.4, vbg pH 7.36/pCO2 35/acid-base deficit 5.5/bicarb 19.2, and UA positive for UTI.  CXR negative for acute cardiopulmonary disease, but revealed cardiomegaly.  COVID-19/Influenza PCR results pending.  Pt met sepsis protocol and received metronidazole and 1L LR bolus.  ER physician also ordered cefepime, digifab, 5 units of iv novolog, 1 amp of D50W, and vancomycin.  PCCM team contacted for ICU admission   Past Medical History  Systolic Murmur Severe Mitral Regurgitation-Echo: EF 55-60% 02/2016 Obsessive Compulsive Disorder Intellectual Disability Wheelchair Bound HLD  Epileptic Seizures Epilepsy   Significant Hospital Events   11/23: Pt admitted to ICU with multiorgan failure due to urosepsis   Consults:  Intensivist   Procedures:  None   Significant Diagnostic Tests:  CT Head 11/23>>no  acute intracranial abnormalities. Diffuse cerebral atrophy. Ventricular dilatation may be due to central atrophy.  Micro Data:  COVID-19/Influenza PCR 11/23>> Blood x2 11/23>> Urine 11/23>>  Antimicrobials:  Metronidazole x1 dose 11/23 Vancomycin x1 dose 11/23 Cefepime x1 dose 11/23 Zosyn 11/23>>  Interim history/subjective:  Pt extremely cachetic and nonverbal unable to participate in review of systems.   Objective   Blood pressure (!) 85/63, pulse (!) 112, resp. rate (!) 22, SpO2 99 %.       No intake or output data in the 24 hours ending 2020/07/06 0607 There were no vitals filed for this visit.  Examination: General: chronically ill appearing cachetic male, NAD on NRB  HENT: supple, no JVD  Lungs: clear throughout, even, non labored  Cardiovascular: atrial flutter with ST depression, no R/G  Abdomen: hypoactive BS x4, soft, non distended, subq iv present  Extremities: spontaneously moves extremities although extremely week  Neuro: nonverbal, unable to follow commands, right pupil 2 mm reactive, left pupil 3 mm reactive  Assessment & Plan:   Acute hypoxic respiratory failure likely secondary to aspiration  Supplemental O2 for dyspnea and/or hypoxia  Prn bronchodilator therapy  Aspiration precautions   Hypotension secondary to UTI  Elevated troponin likely demand ischemia in the setting of onset atrial flutter and metabolic derangements  Mild digoxin toxicity~pt received digifab in ER   Hx: HLD, severe mitral regurgitation, systolic murmur  Continuous telemetry monitoring  Aggressive fluid resuscitation to maintain map >65 Hold outpatient antihypertensives    Acute renal failure with hyperkalemia and anion gap metabolic acidosis  Trend BMP  Replace electrolytes as indicated  Monitor UOP Avoid nephrotoxic medications  Continue maintenance fluids   Elevated liver enzymes secondary to sepsis  Elevated INR without obvious signs of bleeding  Trend CMP, coags, and  CBC Avoid chemical VTE px Monitor for s/sx of bleeding and transfuse for hgb <7  UTI  Trend WBC and monitor fever curve  Trend lactic acid and PCT Follow cultures  Continue abx as outlined above   Acute toxic and metabolic encephalopathy complicated by intellectual disability  Hx: OCD, epileptic seizures, and epilepsy Frequent orientation  Avoid sedating medications when able   Best practice:  Diet: Keep NPO for now  Pain/Anxiety/Delirium protocol (if indicated): Not indicated  VAP protocol (if indicated): Not indicated  DVT prophylaxis: SCD's only GI prophylaxis: IV pepcid  Glucose control: CBG's q4hrs  Mobility: Bed bound/Wheelchairbound   Code Status: DNR  Family Communication: No family at bedside at this time to update; according to ER nurse pt is a ward of the state  Disposition: Stepdown   Labs   CBC: Recent Labs  Lab 12-Jul-2020 0442  WBC 15.1*  NEUTROABS 12.4*  HGB 11.2*  HCT 34.2*  MCV 90.2  PLT 004    Basic Metabolic Panel: Recent Labs  Lab 2020-07-12 0442  NA 138  K 5.9*  CL 101  CO2 20*  GLUCOSE 137*  BUN 92*  CREATININE 3.80*  CALCIUM 9.1  MG 2.6*   GFR: Estimated Creatinine Clearance: 17.7 mL/min (A) (by C-G formula based on SCr of 3.8 mg/dL (H)). Recent Labs  Lab July 12, 2020 0442  WBC 15.1*  LATICACIDVEN 2.4*    Liver Function Tests: Recent Labs  Lab 07-12-2020 0442  AST 159*  ALT 59*  ALKPHOS 49  BILITOT 1.8*  PROT 6.9  ALBUMIN 2.5*   No results for input(s): LIPASE, AMYLASE in the last 168 hours. Recent Labs  Lab 2020/07/12 0442  AMMONIA 13    ABG    Component Value Date/Time   HCO3 19.2 (L) 2020-07-12 0442   ACIDBASEDEF 5.5 (H) 07/12/20 0442   O2SAT 64.4 2020-07-12 0442     Coagulation Profile: Recent Labs  Lab 2020/07/12 0442  INR 3.5*    Cardiac Enzymes: No results for input(s): CKTOTAL, CKMB, CKMBINDEX, TROPONINI in the last 168 hours.  HbA1C: HB A1C (BAYER DCA - WAIVED)  Date/Time Value Ref Range Status    07/15/2015 09:23 AM 5.4 <7.0 % Final    Comment:                                          Diabetic Adult            <7.0                                       Healthy Adult        4.3 - 5.7                                                           (DCCT/NGSP) American Diabetes Association's Summary of Glycemic Recommendations for Adults with Diabetes: Hemoglobin A1c <7.0%. More stringent glycemic goals (A1c <6.0%) may further reduce complications at  the cost of increased risk of hypoglycemia.     CBG: No results for input(s): GLUCAP in the last 168 hours.  Review of Systems:   Unable to assess pt nonverbal   Past Medical History  He,  has a past medical history of Epilepsy ([place]), Epileptic seizures ([place]), Hyperlipidemia, Mental retardation, OCD (obsessive compulsive disorder), OCD (obsessive compulsive disorder), Severe mitral regurgitation, and Systolic murmur.   Surgical History    Past Surgical History:  Procedure Laterality Date  . COLONOSCOPY WITH PROPOFOL N/A 03/23/2019   Procedure: COLONOSCOPY WITH PROPOFOL;  Surgeon: Toledo, Benay Pike, MD;  Location: [facility] ENDOSCOPY;  Service: Gastroenterology;  Laterality: N/A;  . ESOPHAGOGASTRODUODENOSCOPY (EGD) WITH PROPOFOL N/A 03/23/2019   Procedure: ESOPHAGOGASTRODUODENOSCOPY (EGD) WITH PROPOFOL;  Surgeon: Toledo, Benay Pike, MD;  Location: [facility] ENDOSCOPY;  Service: Gastroenterology;  Laterality: N/A;  NEEDS A RAPID     Social History   reports that he has never smoked. He has never used smokeless tobacco. He reports that he does not drink alcohol and does not use drugs.   Family History   His Family history is unknown by patient.   Allergies No Known Allergies   Home Medications  Prior to Admission medications   Medication Sig Start Date End Date Taking? Authorizing Provider  albuterol (PROVENTIL HFA;VENTOLIN HFA) 108 (90 Base) MCG/ACT inhaler Inhale 2 puffs into the lungs every 6 (six) hours as needed for wheezing or shortness  of breath. 07/26/16  Yes Volney American, PA-C  amiodarone (PACERONE) 200 MG tablet Take 1 tablet (200 mg total) by mouth daily. Start taking this dosage on March 16, 2020 03/15/20 July 18, 2020 Yes Wyvonnia Dusky, MD  aspirin EC 81 MG tablet Take 81 mg by mouth daily.   Yes [provider]  benztropine (COGENTIN) 0.5 MG tablet Take 0.5 mg by mouth 2 (two) times daily. 03/14/20  Yes [provider]  carvedilol (COREG) 3.125 MG tablet Take 3.125 mg by mouth 2 (two) times daily with a meal.    Yes [provider]  Cholecalciferol (VITAMIN D) 50 MCG (2000 UT) CAPS Take 2,000 Units by mouth daily.    Yes [provider]  Cyanocobalamin (VITAMIN B 12 PO) Take 1,000 mcg by mouth daily.    Yes [provider]  digoxin 62.5 MCG TABS Take 0.0625 mg by mouth daily. 03/11/20 07/18/2020 Yes Wyvonnia Dusky, MD  Ergocalciferol (VITAMIN D2 PO) Take 1.25 mg by mouth once a week.   Yes [provider]  famotidine (PEPCID) 40 MG tablet Take 40 mg by mouth at bedtime.   Yes [provider]  fenofibrate (TRICOR) 145 MG tablet TAKE 1 TABLET BY MOUTH ONCE DAILY FOR LIPIDS. Patient taking differently: Take 145 mg by mouth daily.  01/02/17  Yes Crissman, Jeannette How, MD  fluticasone (FLONASE) 50 MCG/ACT nasal spray Place 2 sprays into both nostrils daily.   Yes [provider]  glycopyrrolate (ROBINUL) 1 MG tablet Take 1 mg by mouth 2 (two) times daily.   Yes [provider]  lacosamide 100 MG TABS Take 1 tablet (100 mg total) by mouth 2 (two) times daily. 05/03/20  Yes Mariel Aloe, MD  lactulose (CHRONULAC) 10 GM/15ML solution Take 45 mLs (30 g total) by mouth daily. 05/04/20  Yes Mariel Aloe, MD  loratadine (CLARITIN) 10 MG tablet Take 10 mg by mouth daily.   Yes [provider]  midodrine (PROAMATINE) 10 MG tablet Take 10 mg by mouth 3 (three) times daily.  Yes [provider]  Multiple Vitamin (MULTIVITAMIN WITH  MINERALS) TABS tablet Take 1 tablet by mouth daily.   Yes [provider]  Nutritional Supplements (ENSURE PO) Take 1 Can by mouth daily.   Yes [provider]  omeprazole (PRILOSEC) 40 MG capsule Take 40 mg by mouth daily.   Yes [provider]  risperiDONE (RISPERDAL) 1 MG tablet Take 1 tablet (1 mg total) by mouth at bedtime. 03/29/20  Yes Amin, Ankit Chirag, MD  zinc oxide (BALMEX) 11.3 % CREA cream Apply 1 application topically 2 (two) times daily.   Yes [provider]  meclizine (ANTIVERT) 25 MG tablet Take 25 mg by mouth 3 (three) times daily as needed for dizziness. Patient not taking: Reported on 2020-07-10    [provider]     Critical care time: 40 minutes     Marda Stalker, [place] Pager [phone] (please enter 7 digits) PCCM Consult Pager [phone] (please enter 7 digits)

## 2020-06-29 NOTE — Progress Notes (Signed)
PHARMACY -  BRIEF ANTIBIOTIC NOTE   Pharmacy has received consult(s) for Vancomycin and Cefepime from an ED provider.  The patient's profile has been reviewed for ht/wt/allergies/indication/available labs.    One time order(s) placed for Cefepime 2gm and Vancomycin 1gm  Further antibiotics/pharmacy consults should be ordered by admitting physician if indicated.                       Thank you, Wayland Denis 07/14/20  5:38 AM

## 2020-06-29 NOTE — ED Notes (Signed)
RT called to place heated high flow on patient as orders given by Dr. Sarina Ser via secure chat to Carollee Herter, [place]. This RN now primary nurse.

## 2020-06-29 NOTE — Sepsis Progress Note (Signed)
Notified bedside nurse of need to draw repeat lactic acid. 

## 2020-06-29 NOTE — Progress Notes (Signed)
Palliative-   Called patient's legal guardian [name]- left message on both of her voicemails requesting return call.   Ocie Bob, AGNP-C Palliative Medicine  Please call Palliative Medicine team phone with any questions [phone]. For individual providers please see AMION.  No charge note

## 2020-06-29 NOTE — Sepsis Progress Note (Addendum)
Notified provider of need to order another repeat lactic acid as the second one was higher than the first. Dr Jayme Cloud returned a secure chat stating that she would like to hold off on repeat LA as she is attempting to transition patient to comfort care with palliative care's involvement.

## 2020-06-29 NOTE — ED Notes (Signed)
RN awaiting insulin from Pharmacy to administer dextrose and bicarb. Will recheck CBG appropriately after administration of medications.

## 2020-06-29 NOTE — Consult Note (Signed)
Pharmacy Antibiotic Note  Raymond [name] is a 59 y.o. male admitted on 07-15-2020 with sepsis.  Pharmacy has been consulted for Zosyn dosing.  Plan: Will dose Zosyn 2.25g q6h (for CrCL < 20 ml/min)   Temp (24hrs), Avg:97.6 F (36.4 C), Min:97.6 F (36.4 C), Max:97.6 F (36.4 C)  Recent Labs  Lab July 15, 2020 0442  WBC 15.1*  CREATININE 3.80*  LATICACIDVEN 2.4*    Estimated Creatinine Clearance: 17.7 mL/min (A) (by C-G formula based on SCr of 3.8 mg/dL (H)).    No Known Allergies  Antimicrobials this admission: Cefepime/Metronidazole/Vancomycin x 1  Zosyn 11/23 >>  Dose adjustments this admission: None  Microbiology results: 11/23 BCx: pending 11/23 UCx: pending  FLU/COVID NEG  Thank you for allowing pharmacy to be a part of this patient's care.  Albina Billet, PharmD, BCPS Clinical Pharmacist 07/15/2020 7:39 AM

## 2020-06-29 NOTE — ED Triage Notes (Signed)
Pt to ED from [facility] with elevated K= and irregular BUN and Creatinine in blood work performed yesterday. Pt is no verbally responsive but will withdrawal from pain and make eye contact when RN is talking to patient. Unknown baseline at this time.

## 2020-06-29 NOTE — ED Provider Notes (Addendum)
[facility] Emergency Department Provider Note  ____________________________________________   First MD Initiated Contact with Patient 07-07-20 [phone]     (approximate)  I have reviewed the triage vital signs and the nursing notes.   HISTORY  Chief Complaint Hyperkalemia   HPI Raymond [name] is a 59 y.o. male [facility] with a hx of seizure disorder, mental retardation, obsessive-compulsive disorder, dyslipidemia and severe mitral regurgitation and CHF who presents via EMS from his nursing facility for assessment of swelling in the right arm with concern for possible IV filtration and altered mental status.  Per EMS staff reported he had normal vital signs although patient was noted to be tachycardic, hypotensive, and hypoxic requiring 4 L nasal cannula to maintain SPO2 sats.  Patient is nonverbal with EMS although per review of chart records and speaking with his caregiver who had taken care of him for several years before his last hospitalization last month that he has only able to answer yes/no questions.  No other history is available from patient given he is nonverbal at this time.  No other history is available from staff.      Past Medical History:  Diagnosis Date  . Epilepsy (HCC)   . Epileptic seizures (HCC)   . Hyperlipidemia   . Mental retardation   . OCD (obsessive compulsive disorder)    water drinking  . OCD (obsessive compulsive disorder)   . Severe mitral regurgitation    a. 02/2016 Echo: EF 55-60%, no rwma, MVP of posterior leaflet w/ an anteriorly directed MR - severe. Sev dil LA; b. TTE 8/18: EF of 55-60%, normal wall motion, normal LV diastolic function parameters, severe mitral prolapse involving the anterior leaflet and posterior leaflet with severe regurgitation directed anteriorly, severely dilated left atrium, unable to estimate the PASP  . Systolic murmur     Patient Active Problem List   Diagnosis Date Noted  .  Sepsis (HCC) 07-07-2020  . Acute metabolic encephalopathy 05/03/2020  . DNR (do not resuscitate)   . Encephalopathy acute 04/23/2020  . AMS (altered mental status) 04/22/2020  . Chronic systolic CHF (congestive heart failure) (HCC) 03/27/2020  . Protein-calorie malnutrition, severe 03/05/2020  . Acute kidney injury superimposed on CKD (HCC)   . Goals of care, counseling/discussion   . Palliative care by specialist   . DNR (do not resuscitate) discussion   . Acute on chronic diastolic (congestive) heart failure (HCC) 03/02/2020  . Hypotension   . Mitral valve insufficiency   . Acute on chronic diastolic CHF (congestive heart failure) (HCC) 03/01/2020  . HTN (hypertension) 03/01/2020  . GERD (gastroesophageal reflux disease) 03/01/2020  . New onset atrial fibrillation (HCC) 03/01/2020  . Atrial fibrillation with RVR (HCC) 03/01/2020  . Seizure (HCC)   . Nose fracture 04/09/2016  . Weakness 07/11/2015  . Epileptic seizures (HCC) 01/12/2015  . Intellectual disability   . OCD (obsessive compulsive disorder)   . Hypertriglyceridemia   . Epilepsy (HCC)   . Systolic murmur     Past Surgical History:  Procedure Laterality Date  . COLONOSCOPY WITH PROPOFOL N/A 03/23/2019   Procedure: COLONOSCOPY WITH PROPOFOL;  Surgeon: Toledo, Boykin Nearing, MD;  Location: [facility] ENDOSCOPY;  Service: Gastroenterology;  Laterality: N/A;  . ESOPHAGOGASTRODUODENOSCOPY (EGD) WITH PROPOFOL N/A 03/23/2019   Procedure: ESOPHAGOGASTRODUODENOSCOPY (EGD) WITH PROPOFOL;  Surgeon: Toledo, Boykin Nearing, MD;  Location: [facility] ENDOSCOPY;  Service: Gastroenterology;  Laterality: N/A;  NEEDS A RAPID    Prior to Admission medications   Medication Sig Start  Date End Date Taking? Authorizing Provider  albuterol (PROVENTIL HFA;VENTOLIN HFA) 108 (90 Base) MCG/ACT inhaler Inhale 2 puffs into the lungs every 6 (six) hours as needed for wheezing or shortness of breath. 07/26/16  Yes Particia Nearing, PA-C  amiodarone (PACERONE) 200  MG tablet Take 1 tablet (200 mg total) by mouth daily. Start taking this dosage on March 16, 2020 03/15/20 07/16/20 Yes Charise Killian, MD  aspirin EC 81 MG tablet Take 81 mg by mouth daily.   Yes [provider]  benztropine (COGENTIN) 0.5 MG tablet Take 0.5 mg by mouth 2 (two) times daily. 03/14/20  Yes [provider]  carvedilol (COREG) 3.125 MG tablet Take 3.125 mg by mouth 2 (two) times daily with a meal.    Yes [provider]  Cholecalciferol (VITAMIN D) 50 MCG (2000 UT) CAPS Take 2,000 Units by mouth daily.    Yes [provider]  Cyanocobalamin (VITAMIN B 12 PO) Take 1,000 mcg by mouth daily.    Yes [provider]  digoxin 62.5 MCG TABS Take 0.0625 mg by mouth daily. 03/11/20 07-16-20 Yes Charise Killian, MD  Ergocalciferol (VITAMIN D2 PO) Take 1.25 mg by mouth once a week.   Yes [provider]  famotidine (PEPCID) 40 MG tablet Take 40 mg by mouth at bedtime.   Yes [provider]  fenofibrate (TRICOR) 145 MG tablet TAKE 1 TABLET BY MOUTH ONCE DAILY FOR LIPIDS. Patient taking differently: Take 145 mg by mouth daily.  01/02/17  Yes Crissman, Redge Gainer, MD  fluticasone (FLONASE) 50 MCG/ACT nasal spray Place 2 sprays into both nostrils daily.   Yes [provider]  glycopyrrolate (ROBINUL) 1 MG tablet Take 1 mg by mouth 2 (two) times daily.   Yes [provider]  lacosamide 100 MG TABS Take 1 tablet (100 mg total) by mouth 2 (two) times daily. 05/03/20  Yes Narda Bonds, MD  lactulose (CHRONULAC) 10 GM/15ML solution Take 45 mLs (30 g total) by mouth daily. 05/04/20  Yes Narda Bonds, MD  loratadine (CLARITIN) 10 MG tablet Take 10 mg by mouth daily.   Yes [provider]  midodrine (PROAMATINE) 10 MG tablet Take 10 mg by mouth 3 (three) times daily.   Yes [provider]  Multiple Vitamin (MULTIVITAMIN WITH MINERALS) TABS tablet Take 1 tablet by mouth daily.   Yes [provider]   Nutritional Supplements (ENSURE PO) Take 1 Can by mouth daily.   Yes [provider]  omeprazole (PRILOSEC) 40 MG capsule Take 40 mg by mouth daily.   Yes [provider]  risperiDONE (RISPERDAL) 1 MG tablet Take 1 tablet (1 mg total) by mouth at bedtime. 03/29/20  Yes Amin, Ankit Chirag, MD  zinc oxide (BALMEX) 11.3 % CREA cream Apply 1 application topically 2 (two) times daily.   Yes [provider]  meclizine (ANTIVERT) 25 MG tablet Take 25 mg by mouth 3 (three) times daily as needed for dizziness. Patient not taking: Reported on July 16, 2020    [provider]    Allergies Patient has no known allergies.  Family History  Family history unknown: Yes    Social History Social History   Tobacco Use  . Smoking status: Never Smoker  . Smokeless tobacco: Never Used  Vaping Use  . Vaping Use: Never used  Substance Use Topics  . Alcohol use: No    Alcohol/week: 0.0 standard drinks  . Drug use: No    Review of Systems  Review of Systems  Unable to perform ROS: Mental status change      ____________________________________________   PHYSICAL EXAM:  VITAL SIGNS: ED Triage Vitals  Enc Vitals Group     BP      Pulse      Resp      Temp      Temp src      SpO2      Weight      Height      Head Circumference      Peak Flow      Pain Score      Pain Loc      Pain Edu?      Excl. in GC?    Vitals:   17-Apr-2020 0530  BP: (!) 85/63  Pulse: (!) 112  Resp: (!) 22  SpO2: 99%   Physical Exam Vitals and nursing note reviewed.  Constitutional:      General: He is in acute distress.     Appearance: He is ill-appearing.  HENT:     Head: Normocephalic and atraumatic.     Right Ear: External ear normal.     Left Ear: External ear normal.     Mouth/Throat:     Mouth: Mucous membranes are dry.  Eyes:     Extraocular Movements: Extraocular movements intact.     Conjunctiva/sclera: Conjunctivae normal.     Pupils: Pupils are equal,  round, and reactive to light.  Cardiovascular:     Rate and Rhythm: Regular rhythm. Tachycardia present.     Pulses: Normal pulses.  Pulmonary:     Effort: Tachypnea present.     Breath sounds: No stridor. No wheezing or rhonchi.  Abdominal:     General: There is no distension.     Tenderness: There is no abdominal tenderness.  Musculoskeletal:     Cervical back: No rigidity.     Right lower leg: No edema.     Left lower leg: No edema.  Skin:    General: Skin is warm.     Capillary Refill: Capillary refill takes more than 3 seconds.  Neurological:     Mental Status: He is lethargic.     No significant edema of the right upper extremity.  2+ radial pulses.  Patient does withdraw his extremities to noxious stimuli.  PERRLA.  EOMI.  He does not otherwise participate in neurological exam. ____________________________________________   LABS (all labs ordered are listed, but only abnormal results are displayed)  Labs Reviewed  CBC WITH DIFFERENTIAL/PLATELET - Abnormal; Notable for the following components:      Result Value   WBC 15.1 (*)    RBC 3.79 (*)    Hemoglobin 11.2 (*)    HCT 34.2 (*)    RDW 15.6 (*)    Neutro Abs 12.4 (*)    Abs Immature Granulocytes 0.21 (*)    All other components within normal limits  COMPREHENSIVE METABOLIC PANEL - Abnormal; Notable for the following components:   Potassium 5.9 (*)    CO2 20 (*)    Glucose, Bld 137 (*)    BUN 92 (*)    Creatinine, Ser 3.80 (*)    Albumin 2.5 (*)    AST 159 (*)    ALT 59 (*)    Total Bilirubin 1.8 (*)    GFR, Estimated 18 (*)    Anion gap 17 (*)    All other components within normal limits  BLOOD GAS, VENOUS - Abnormal; Notable for the following components:  pCO2, Ven 34 (*)    Bicarbonate 19.2 (*)    Acid-base deficit 5.5 (*)    All other components within normal limits  DIGOXIN LEVEL - Abnormal; Notable for the following components:   Digoxin Level 2.4 (*)    All other components within normal limits   MAGNESIUM - Abnormal; Notable for the following components:   Magnesium 2.6 (*)    All other components within normal limits  BRAIN NATRIURETIC PEPTIDE - Abnormal; Notable for the following components:   B Natriuretic Peptide 488.8 (*)    All other components within normal limits  LACTIC ACID, PLASMA - Abnormal; Notable for the following components:   Lactic Acid, Venous 2.4 (*)    All other components within normal limits  PROTIME-INR - Abnormal; Notable for the following components:   Prothrombin Time 33.9 (*)    INR 3.5 (*)    All other components within normal limits  APTT - Abnormal; Notable for the following components:   aPTT 51 (*)    All other components within normal limits  URINALYSIS, COMPLETE (UACMP) WITH MICROSCOPIC - Abnormal; Notable for the following components:   Color, Urine YELLOW (*)    APPearance TURBID (*)    Hgb urine dipstick LARGE (*)    Protein, ur 30 (*)    Leukocytes,Ua LARGE (*)    RBC / HPF >50 (*)    WBC, UA >50 (*)    All other components within normal limits  TROPONIN I (HIGH SENSITIVITY) - Abnormal; Notable for the following components:   Troponin I (High Sensitivity) 85 (*)    All other components within normal limits  RESP PANEL BY RT-PCR (FLU A&B, COVID) [id number]  URINE CULTURE  CULTURE, BLOOD (ROUTINE X 2)  CULTURE, BLOOD (ROUTINE X 2)  AMMONIA  LACTIC ACID, PLASMA  BASIC METABOLIC PANEL  PROCALCITONIN   ____________________________________________  EKG  A flutter with a ventricular rate of 125, 2-1 AV block, diffuse ST changes in anterior leads with depressions noted throughout and some T wave inversions in the lateral leads.  There is also some nonspecific changes in the inferior leads. ____________________________________________  RADIOLOGY  ED MD interpretation: CT head shows no acute intracranial abnormalities. Chest x-ray shows some cardiomegaly without evidence of large effusion, pneumothorax, significant edema, or overt  edema.  Official radiology report(s): DG Chest 1 View  Result Date: 07/13/2020 CLINICAL DATA:  Unresponsive EXAM: CHEST  1 VIEW COMPARISON:  04/25/2020 FINDINGS: Cardiac enlargement. Lungs are clear. No pleural effusions. No pneumothorax. Mediastinal contours appear intact. IMPRESSION: Cardiac enlargement. No evidence of active pulmonary disease. Electronically Signed   By: Burman Nieves M.D.   On: Jul 13, 2020 05:09   CT Head Wo Contrast  Result Date: 07-13-20 CLINICAL DATA:  Mental status changes of unknown cause. Not verbally responsive. EXAM: CT HEAD WITHOUT CONTRAST TECHNIQUE: Contiguous axial images were obtained from the base of the skull through the vertex without intravenous contrast. COMPARISON:  CT head 04/22/2020.  MRI brain 04/27/2020 FINDINGS: Brain: Diffuse cerebral atrophy. Ventricular dilatation may be due to central atrophy. Cavum septum vergae. No mass effect or midline shift. No abnormal extra-axial fluid collections. Gray-white matter junctions are distinct. Basal cisterns are not effaced. No acute intracranial hemorrhage. Vascular: Moderate intracranial arterial vascular calcification. Skull: The calvarium appears intact. Sinuses/Orbits: Paranasal sinuses and mastoid air cells are clear. Other: None. IMPRESSION: 1. No acute intracranial abnormalities. 2. Diffuse cerebral atrophy. Ventricular dilatation may be due to central atrophy. Electronically Signed   By: Burman Nieves  M.D.   On: 07-10-20 05:06    ____________________________________________   PROCEDURES  Procedure(s) performed (including Critical Care):  .Critical Care Performed by: Gilles Chiquito, MD Authorized by: Gilles Chiquito, MD   Critical care provider statement:    Critical care time (minutes):  75   Critical care time was exclusive of:  Separately billable procedures and treating other patients   Critical care was necessary to treat or prevent imminent or life-threatening deterioration of  the following conditions:  Sepsis, dehydration, metabolic crisis, respiratory failure, renal failure and cardiac failure   Critical care was time spent personally by me on the following activities:  Discussions with consultants, evaluation of patient's response to treatment, examination of patient, ordering and performing treatments and interventions, ordering and review of laboratory studies, ordering and review of radiographic studies, pulse oximetry, re-evaluation of patient's condition, obtaining history from patient or surrogate and review of old charts     ____________________________________________   INITIAL IMPRESSION / ASSESSMENT AND PLAN / ED COURSE        Patient presents with above to history exam for assessment of her mental status and concern for possible infiltration of the IV in the right upper extremity.  However patient is nonverbal and lethargic as well as hypoxic, tachycardic and hypotensive on arrival.  Patient has significant mouth breathing and was still hypoxic on 4 L nasal cannula so he was switched to nonrebreather.  Regarding his encephalopathy I suspect this is related to septic shock and toxic digoxin levels as well as acute renal failure.  Low suspicion for acute traumatic injury and patient has no evidence of hemorrhage on head CT.  Chest x-ray shows cardiomegaly without evidence of pneumonia.  Patient's urine does appear grossly infected I suspect this may be a source of his sepsis.   CBC remarkable for leukocytosis with WBC count of 50.1, anemia with a hemoglobin 11.2 compared to 11.39-month ago and no significant derangements. CMP remarkable for hyperkalemia with a K of 5.9 as well as an anion gap metabolic acidosis with a bicarb of 20 and anion gap of 17, acute kidney injury with a creatinine of 3.8 compared to 0.46-month ago and mild transaminitis with AST of 159 and ALT of 59.  I suspect this kidney injury and transaminitis are related to hypertensive  injury.  Digoxin levels of toxic at 2.4.  Patient's troponin is elevated 85 compared to 10 2 months ago.  Suspect patient has some demand ischemia as pattern has diffuse ischemia in his EKG he is in a flutter.  However this will need to be trended.  Magnesium is elevated at 2.6.  BNP is also elevated at 48 although this is down from 601 2 months ago.  Lactic acid is elevated 2.4.  INR is also elevated 3.5 as is APTT which may represent DIC secondary to sepsis.  Patient was given IV fluids and broad-spectrum antibiotics.  In addition patient was given albuterol insulin dextrose and Digibind for his hyperkalemia and toxic dig levels.  Given severity of metabolic derangements and high mortality associated with hyperkalemia and elevated digoxin levels in the setting of acute renal failure and sepsis with altered mental status patient was admitted in critical condition to the ICU. ____________________________________________   FINAL CLINICAL IMPRESSION(S) / ED DIAGNOSES  Final diagnoses:  Sepsis, due to unspecified organism, unspecified whether acute organ dysfunction present (HCC)  Troponin I above reference range  Hyperkalemia  AKI (acute kidney injury) (HCC)  Poisoning by digitalis glycoside, accidental or unintentional,  initial encounter  Acute respiratory failure with hypoxia (HCC)  Altered mental status, unspecified altered mental status type  DNR (do not resuscitate)    Medications  ceFEPIme (MAXIPIME) 2 g in sodium chloride 0.9 % 100 mL IVPB (has no administration in time range)  metroNIDAZOLE (FLAGYL) IVPB 500 mg (500 mg Intravenous New Bag/Given 25-Jun-2020 0518)  vancomycin (VANCOCIN) IVPB 1000 mg/200 mL premix (has no administration in time range)  lactated ringers infusion (has no administration in time range)  insulin aspart (novoLOG) injection 5 Units (has no administration in time range)    And  dextrose 50 % solution 50 mL (has no administration in time range)  albuterol  (PROVENTIL) (2.5 MG/3ML) 0.083% nebulizer solution 10 mg (has no administration in time range)  digoxin immune fab (DIGIFAB) 10 vial in sodium chloride 0.9 % 50 mL IVPB ( Intravenous Canceled Entry 2020/06/25 0549)  docusate sodium (COLACE) capsule 100 mg (has no administration in time range)  polyethylene glycol (MIRALAX / GLYCOLAX) packet 17 g (has no administration in time range)  ondansetron (ZOFRAN) injection 4 mg (has no administration in time range)  ipratropium-albuterol (DUONEB) 0.5-2.5 (3) MG/3ML nebulizer solution 3 mL (has no administration in time range)  sodium bicarbonate injection 50 mEq (has no administration in time range)  lactated ringers bolus 1,000 mL (1,000 mLs Intravenous New Bag/Given 06-25-2020 0511)     ED Discharge Orders    None       Note:  This document was prepared using Dragon voice recognition software and may include unintentional dictation errors.   Gilles Chiquito, MD 2020-06-25 [id number]    Gilles Chiquito, MD June 25, 2020 [phone]

## 2020-06-29 NOTE — ED Notes (Signed)
Pt transported to CT ?

## 2020-06-29 NOTE — Sepsis Progress Note (Signed)
Following for Code Sepsis Monitoring  

## 2020-06-29 NOTE — Progress Notes (Signed)
[facility] Liaison note:  Patient is currently followed by [facility] outpatient Palliative services at [facility]. TOC Teresita Maxey made aware. Per chart note review patient has been made comfort care. Dayna Barker BSN ,RN, [facility] [employer] [phone]

## 2020-06-29 NOTE — Progress Notes (Signed)
Pt passed away at 1623, two RNs pronounced at bedside. RN called Shon Baton legal guardian at time of death. No answer. RN left voicemail to call the unit back.

## 2020-06-29 NOTE — ED Notes (Signed)
RT at bedside to set up heated high flow for patient

## 2020-06-29 NOTE — Progress Notes (Signed)
Dilaudid and ativan administered for SOB and comfort needs, pt appears to be comfortable at this time. [name] (former care giver) allowed at bedside with 2 family members. Comfort care cart provided. Will continue to monitor.

## 2020-06-29 NOTE — Death Summary Note (Signed)
DEATH SUMMARY   Patient Details  Name: Raymond [name] MRN: [id number] DOB: [birth year]  Admission/Discharge Information   Admit Date:  07-08-2020  Date of Death:  07-08-2020  Time of Death:  16:23 HRS  Length of Stay: 0  Referring Physician: Ellene Route   Reason(s) for Hospitalization  Altered mental status-severe sepsis  Diagnoses  Preliminary cause of death:  Secondary Diagnoses (including complications and co-morbidities):  Active Problems:   Sepsis ([place])   Comfort measures only status   Acute respiratory failure with hypoxia (HCC)   AKI (acute kidney injury) [facility])   Brief Hospital Course (including significant findings, care, treatment, and services provided and events leading to death)  Raymond [name] is a 59 y.o. year old male  who presented to [facility] ER on 07-09-23 from [facility] via EMS with altered mental status; swelling in the right arm concerning for possible iv infiltration, and hyperkalemia.  Per ER notes EMS reported upon their arrival at facility pt tachycardic, hypotensive, and hypoxic requiring 4L O2 via nasal canula.  EMS also reported the pt is nonverbal although during hospitalization 1 month ago pt able to answer yes/no questions only.  Upon arrival to the ER pt transitioned to NRB.  ER lab results revealed K+ 5.9, CO2 20, glucose 137, BUN 92, creatinine 3.80, anion gap 17, magnesium 2.6, albumin 2.5, AST 159, ALT 59, BNP 488.8, troponin 85, lactic acid 2.4, wbc 15.1, hgb 11.2, PT 33.9, INR 3.5, serum digoxin 2.4, vbg pH 7.36/pCO2 35/acid-base deficit 5.5/bicarb 19.2, and UA positive for UTI.  CXR negative for acute cardiopulmonary disease, but revealed cardiomegaly.  COVID-19/Influenza PCR results pending.  Pt met sepsis protocol and received metronidazole and 1L LR bolus.  ER physician also ordered cefepime, digifab, 5 units of iv novolog, 1 amp of D50W, and vancomycin.  PCCM team contacted for ICU admission. Laboratory data consistent with  urinary tract infection with severe sepsis.  Further evaluation patient was noted to be severely cachectic he had had slow deterioration for over a month spare time. Patient was DNR. Evaluation revealed that the patient was preterminal and that the best course of action would be to transition the patient to comfort care due to multiple metabolic derangements. Patient was already DNR on admission. He had had prior palliative care evaluation as an outpatient and plans were to transition to hospice care. The patient was not a candidate for dialysis or for further aggressive care. He had severe protein calorie malnutrition with advanced cachexia and loss of skin integrity. Palliative care was consulted and they contacted the patient's legal guardian through the department of social services. The patient was transitioned to comfort care, family was able to be present and he expired quietly at 1623 hrs. on Jul 08, 2020.    Pertinent Labs and Studies  Significant Diagnostic Studies DG Chest 1 View  Result Date: 08-Jul-2020 CLINICAL DATA:  Unresponsive EXAM: CHEST  1 VIEW COMPARISON:  04/25/2020 FINDINGS: Cardiac enlargement. Lungs are clear. No pleural effusions. No pneumothorax. Mediastinal contours appear intact. IMPRESSION: Cardiac enlargement. No evidence of active pulmonary disease. Electronically Signed   By: Lucienne Capers M.D.   On: 07-08-20 05:09   CT Head Wo Contrast  Result Date: 07/08/2020 CLINICAL DATA:  Mental status changes of unknown cause. Not verbally responsive. EXAM: CT HEAD WITHOUT CONTRAST TECHNIQUE: Contiguous axial images were obtained from the base of the skull through the vertex without intravenous contrast. COMPARISON:  CT head 04/22/2020.  MRI brain 04/27/2020 FINDINGS: Brain: Diffuse cerebral atrophy. Ventricular  dilatation may be due to central atrophy. Cavum septum vergae. No mass effect or midline shift. No abnormal extra-axial fluid collections. Gray-white matter junctions  are distinct. Basal cisterns are not effaced. No acute intracranial hemorrhage. Vascular: Moderate intracranial arterial vascular calcification. Skull: The calvarium appears intact. Sinuses/Orbits: Paranasal sinuses and mastoid air cells are clear. Other: None. IMPRESSION: 1. No acute intracranial abnormalities. 2. Diffuse cerebral atrophy. Ventricular dilatation may be due to central atrophy. Electronically Signed   By: Lucienne Capers M.D.   On: 2020/07/02 05:06    Microbiology Recent Results (from the past 240 hour(s))  Resp Panel by RT-PCR (Flu A&B, Covid) Nasopharyngeal Swab     Status: None   Collection Time: 07-02-2020  4:42 AM   Specimen: Nasopharyngeal Swab; Nasopharyngeal(NP) swabs in vial transport medium  Result Value Ref Range Status   SARS Coronavirus 2 by RT PCR NEGATIVE NEGATIVE Final    Comment: (NOTE) SARS-CoV-2 target nucleic acids are NOT DETECTED.  The SARS-CoV-2 RNA is generally detectable in upper respiratory specimens during the acute phase of infection. The lowest concentration of SARS-CoV-2 viral copies this assay can detect is 138 copies/mL. A negative result does not preclude SARS-Cov-2 infection and should not be used as the sole basis for treatment or other patient management decisions. A negative result may occur with  improper specimen collection/handling, submission of specimen other than nasopharyngeal swab, presence of viral mutation(s) within the areas targeted by this assay, and inadequate number of viral copies(<138 copies/mL). A negative result must be combined with clinical observations, patient history, and epidemiological information. The expected result is Negative.  Fact Sheet for Patients:  EntrepreneurPulse.com.au  Fact Sheet for Healthcare Providers:  IncredibleEmployment.be  This test is no t yet approved or cleared by the [place] FDA and  has been authorized for detection and/or diagnosis of  SARS-CoV-2 by FDA under an Emergency Use Authorization (EUA). This EUA will remain  in effect (meaning this test can be used) for the duration of the COVID-19 declaration under Section 564(b)(1) of the Act, 21 U.S.C.section 360bbb-3(b)(1), unless the authorization is terminated  or revoked sooner.       Influenza A by PCR NEGATIVE NEGATIVE Final   Influenza B by PCR NEGATIVE NEGATIVE Final    Comment: (NOTE) The Xpert Xpress SARS-CoV-2/FLU/RSV plus assay is intended as an aid in the diagnosis of influenza from Nasopharyngeal swab specimens and should not be used as a sole basis for treatment. Nasal washings and aspirates are unacceptable for Xpert Xpress SARS-CoV-2/FLU/RSV testing.  Fact Sheet for Patients: EntrepreneurPulse.com.au  Fact Sheet for Healthcare Providers: IncredibleEmployment.be  This test is not yet approved or cleared by the [place] FDA and has been authorized for detection and/or diagnosis of SARS-CoV-2 by FDA under an Emergency Use Authorization (EUA). This EUA will remain in effect (meaning this test can be used) for the duration of the COVID-19 declaration under Section 564(b)(1) of the Act, 21 U.S.C. section 360bbb-3(b)(1), unless the authorization is terminated or revoked.  Performed at [facility], [address] [place], [place] [address]   MRSA PCR Screening     Status: None   Collection Time: 07-02-20  8:28 AM   Specimen: Nasopharyngeal  Result Value Ref Range Status   MRSA by PCR NEGATIVE NEGATIVE Final    Comment:        The GeneXpert MRSA Assay (FDA approved for NASAL specimens only), is one component of a comprehensive MRSA colonization surveillance program. It is not intended to diagnose MRSA  infection nor to guide or monitor treatment for MRSA infections. Performed at [facility], [address] [place], [place] [address]     Lab Basic Metabolic Panel: Recent Labs   Lab 06-30-20 0442 06-30-20 0853  NA 138 138  K 5.9* 4.3  CL 101 103  CO2 20* 20*  GLUCOSE 137* 239*  BUN 92* 87*  CREATININE 3.80* 3.46*  CALCIUM 9.1 8.1*  MG 2.6*  --    Liver Function Tests: Recent Labs  Lab 2020/06/30 0442  AST 159*  ALT 59*  ALKPHOS 49  BILITOT 1.8*  PROT 6.9  ALBUMIN 2.5*   No results for input(s): LIPASE, AMYLASE in the last 168 hours. Recent Labs  Lab 06-30-20 0442  AMMONIA 13   CBC: Recent Labs  Lab 2020-06-30 0442  WBC 15.1*  NEUTROABS 12.4*  HGB 11.2*  HCT 34.2*  MCV 90.2  PLT 285   Cardiac Enzymes: No results for input(s): CKTOTAL, CKMB, CKMBINDEX, TROPONINI in the last 168 hours. Sepsis Labs: Recent Labs  Lab 30-Jun-2020 0442 06-30-20 0641 June 30, 2020 0853  PROCALCITON  --  2.11  --   WBC 15.1*  --   --   LATICACIDVEN 2.4*  --  3.6*    Procedures/Operations     C. Derrill Kay, MD [place] [facility] June 30, 2020, 4:47 PM

## 2020-06-29 DEATH — deceased

## 2021-01-20 IMAGING — CT CT CERVICAL SPINE W/O CM
3 series · 13 of 33 positions shown, 16 images · non-contrast
Comparison: None.

CLINICAL DATA: Altered mental status

EXAM:
CT CERVICAL SPINE WITHOUT CONTRAST
TECHNIQUE: Multidetector CT imaging of the cervical spine was performed without
intravenous contrast. Multiplanar CT image reconstructions were also
generated.

[Series 3: c spine soft · axial · 0.32mm/px · z∈[+663,+787]mm · 5 of 88 slices shown, 7 images]
[im 14/88  soft-tissue]
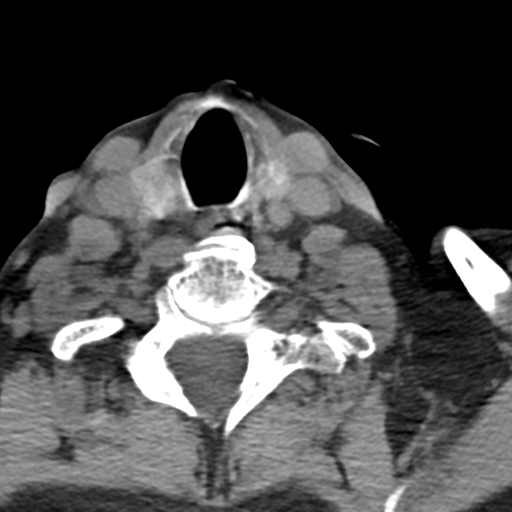
[im 14/88  bone]
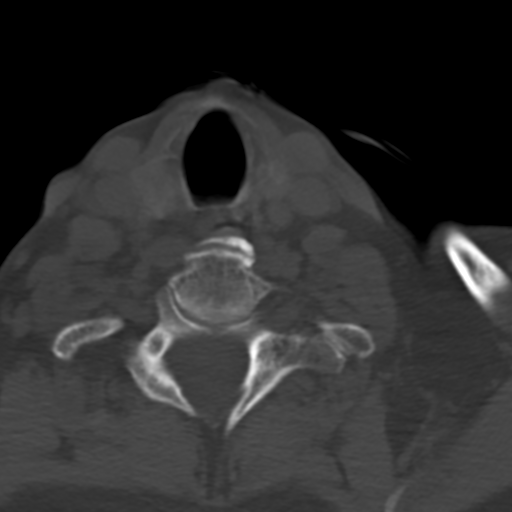
[im 27/88  bone]
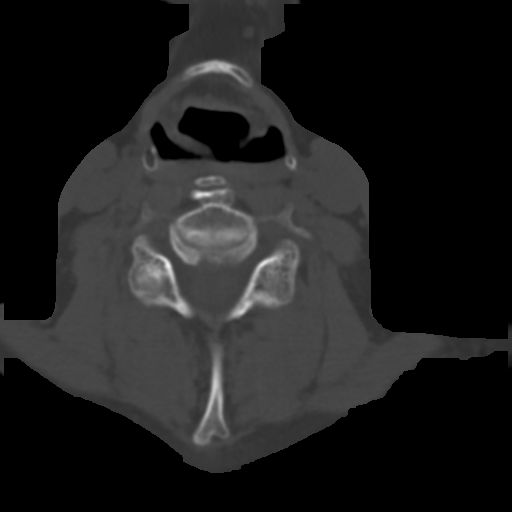
[im 47/88  bone]
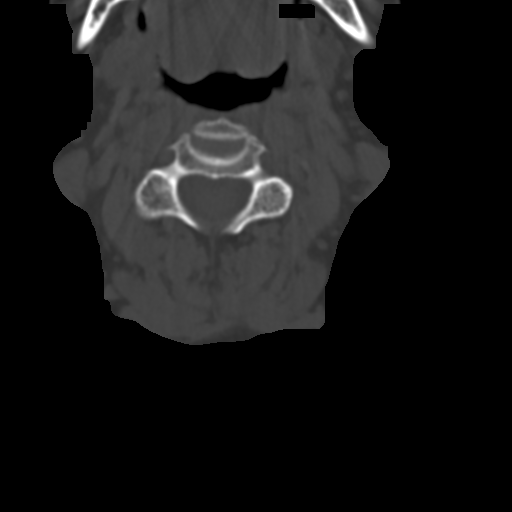
[im 61/88  bone]
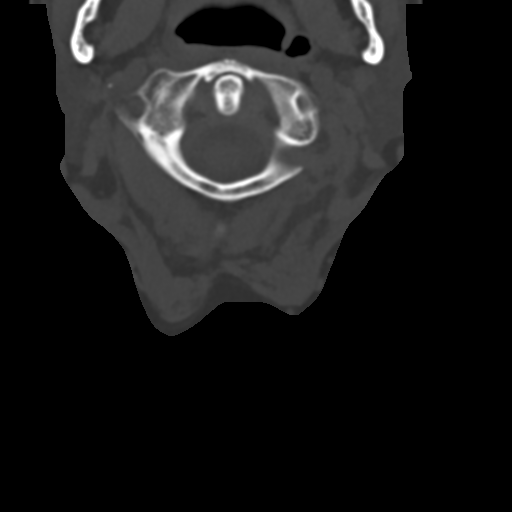
[im 74/88  soft-tissue]
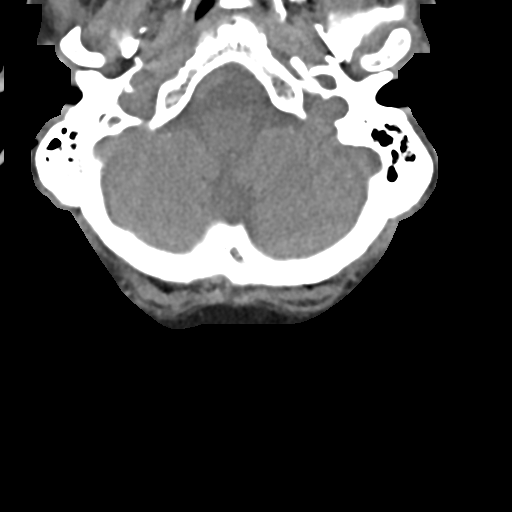
[im 74/88  bone]
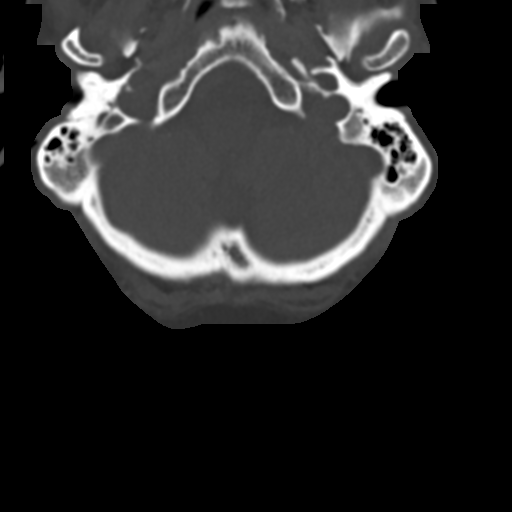

[Series 4: sagittal bone · sagittal · 0.26mm/px · 5 of 54 slices shown, 6 images]
[im 18/54  bone]
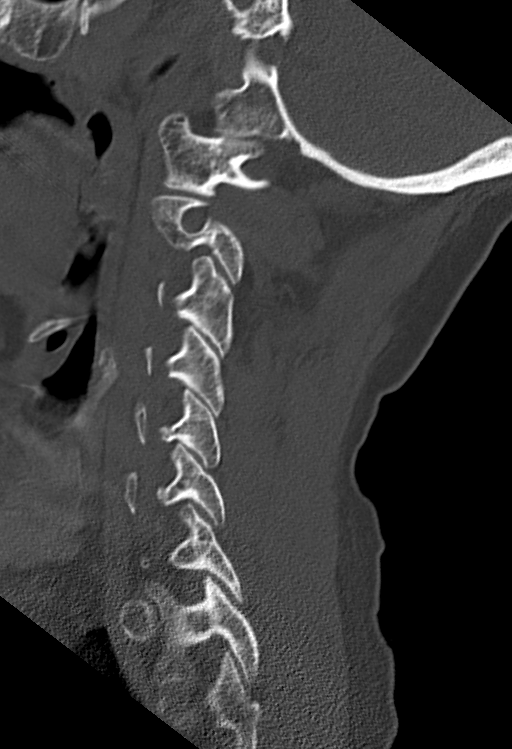
[im 23/54  bone]
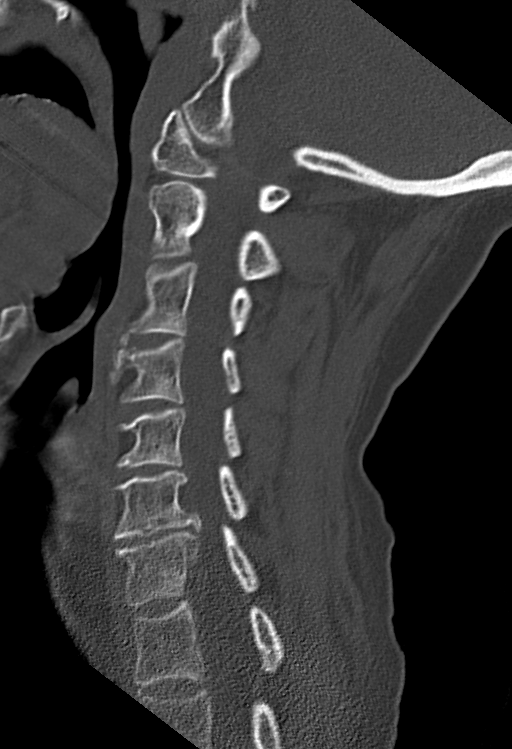
[im 27/54  soft-tissue]
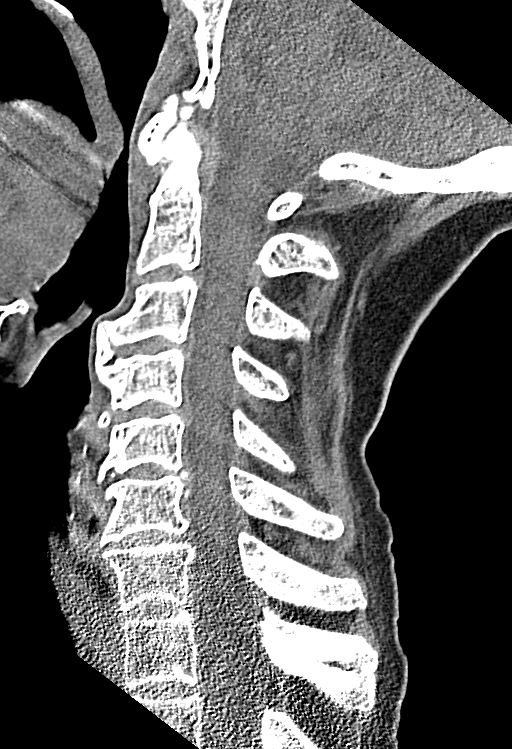
[im 27/54  bone]
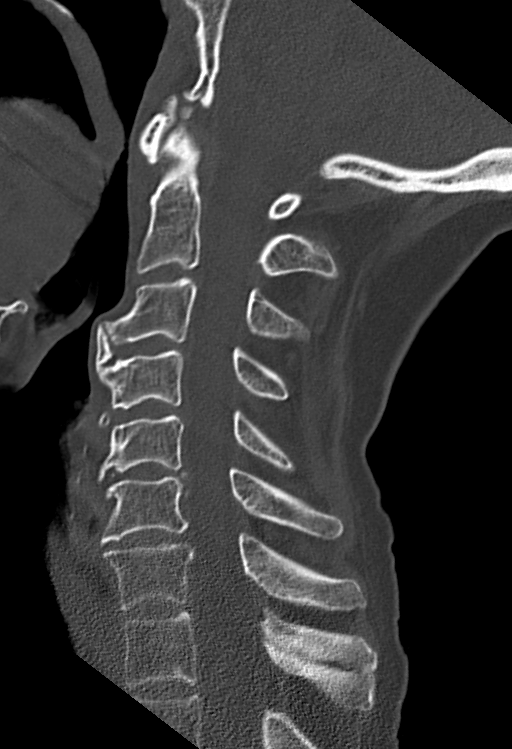
[im 31/54  bone]
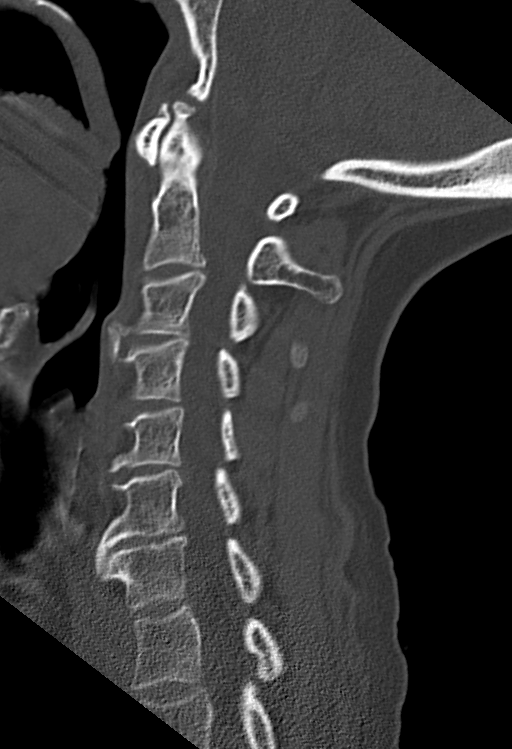
[im 36/54  bone]
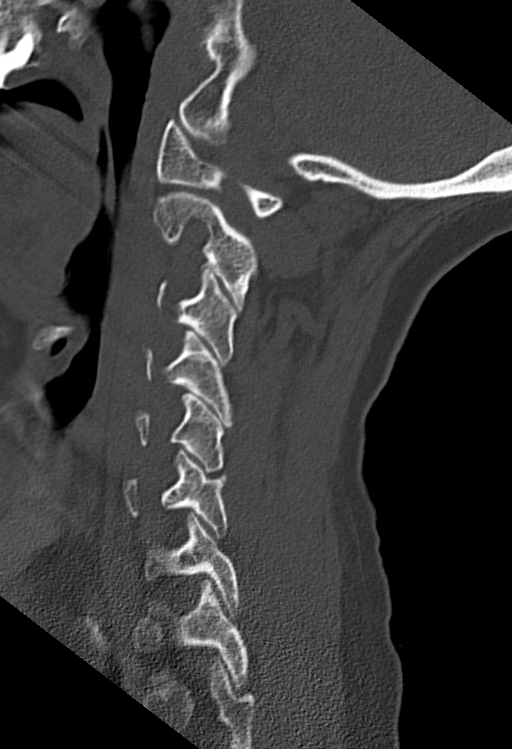

[Series 5: coronal bone · coronal · 0.26mm/px · 3 of 48 slices shown]
[im 11/48  bone]
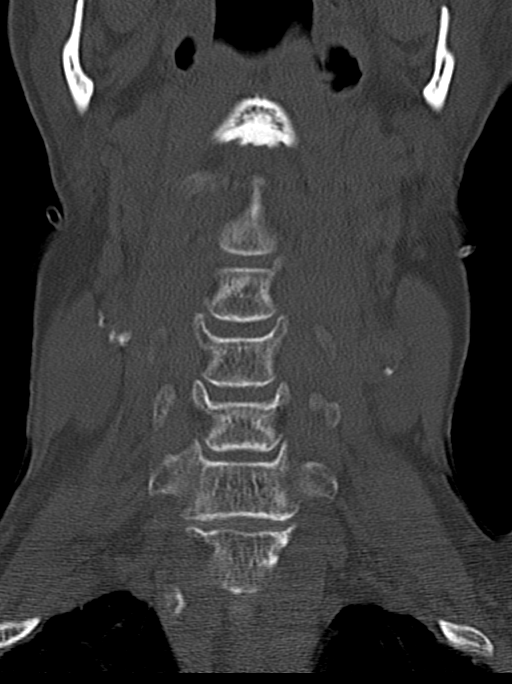
[im 20/48  bone]
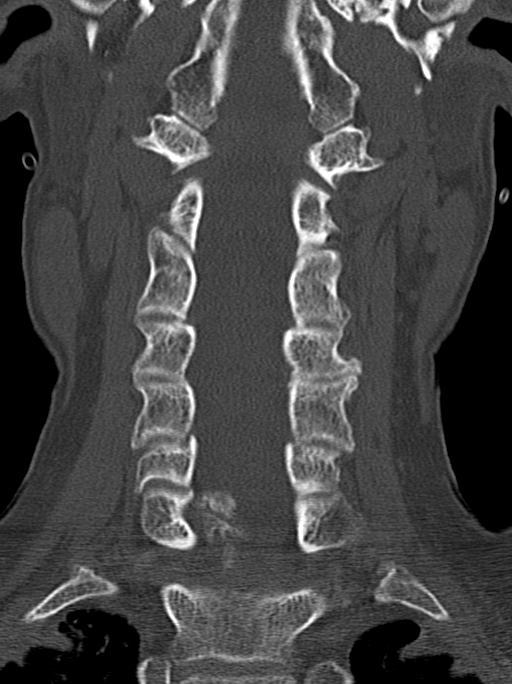
[im 28/48  bone]
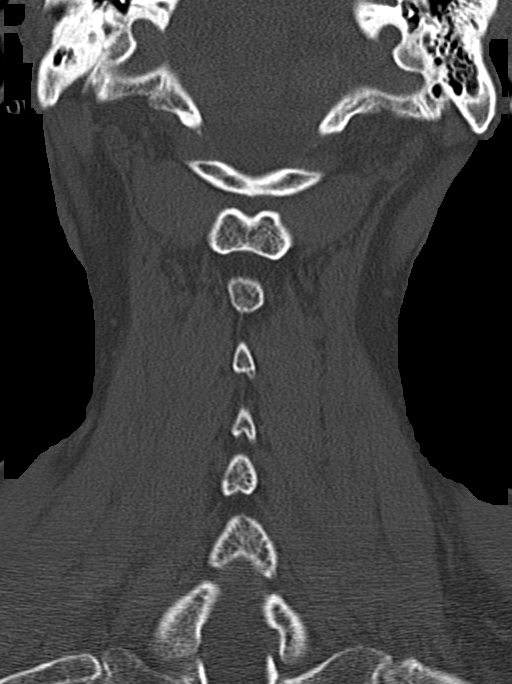

[13 of 33 positions shown; findings below may reference images not displayed]

FINDINGS: Alignment: Straightening of lordosis.  No listhesis.

Skull base and vertebrae: No acute fracture. No primary bone lesion
or focal pathologic process.

Soft tissues and spinal canal: No prevertebral fluid or swelling. No
visible canal hematoma.

Disc levels: Multilevel endplate degenerative changes including
bridging osteophytosis and disc space loss most prominent at the
C6-7 level. Patent bony spinal canal. Moderate right C6-7 bony
neural foraminal narrowing secondary to uncovertebral and facet
hypertrophy.

Upper chest: Clear lung apices.

Other: None.
IMPRESSION: No acute osseous abnormality or traumatic listhesis.

Multilevel spondylosis. Moderate right C6-7 bony neural foraminal
narrowing.

## 2021-01-20 IMAGING — DX DG PORTABLE PELVIS
1 series · 1 of 1 positions shown · non-contrast
Comparison: None.

CLINICAL DATA: Pelvic pain.

EXAM:
PORTABLE PELVIS 1-2 VIEWS

[pelvis ap]
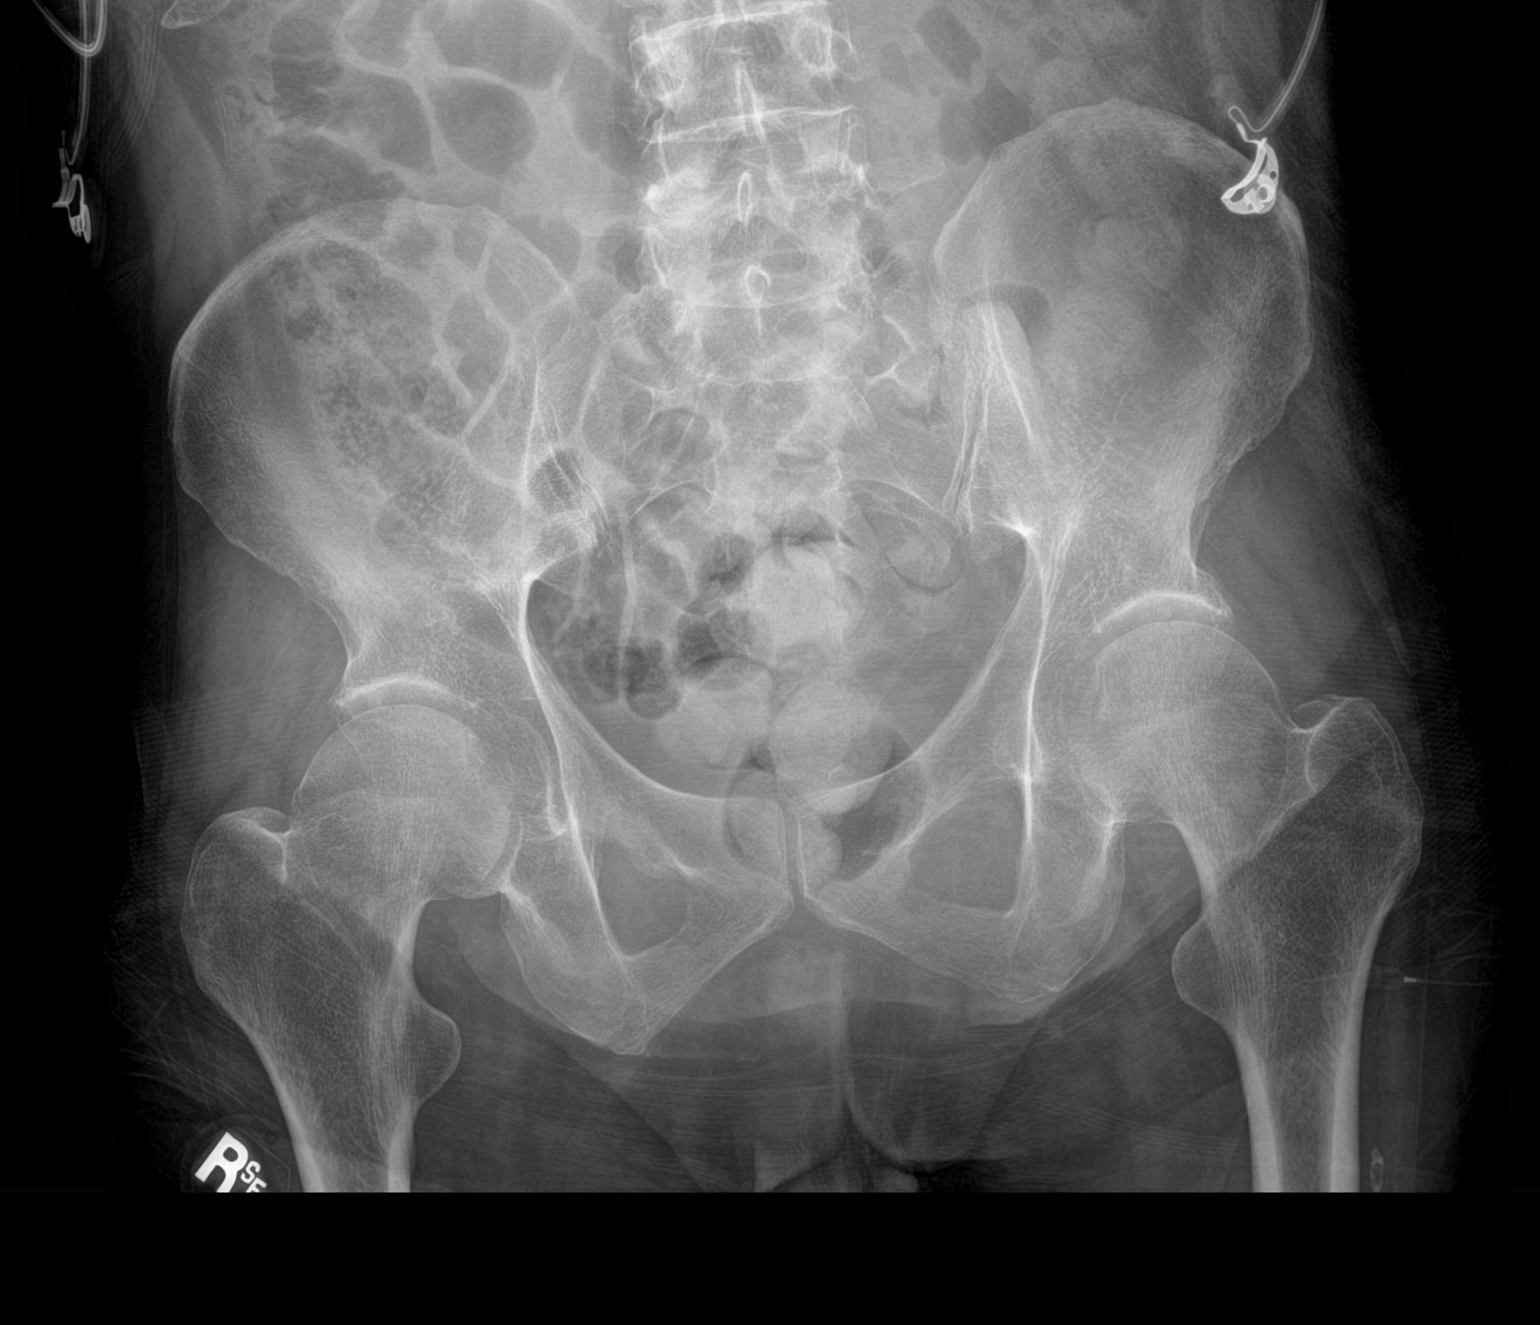

[1 of 1 positions shown; findings below may reference images not displayed]

FINDINGS: Both hips are normally located. No definite hip fractures. The pubic
symphysis and SI joints are intact. No definite pelvic fractures.
IMPRESSION: No acute bony findings.
